# Patient Record
Sex: Male | Born: 1945
Health system: Southern US, Community
[De-identification: ages and names within clinical notes are randomized; demographics above are authoritative.]

## PROBLEM LIST (undated history)

## (undated) DIAGNOSIS — I10 Essential (primary) hypertension: Secondary | ICD-10-CM

## (undated) DIAGNOSIS — E785 Hyperlipidemia, unspecified: Secondary | ICD-10-CM

## (undated) DIAGNOSIS — G1221 Amyotrophic lateral sclerosis: Secondary | ICD-10-CM

## (undated) DIAGNOSIS — J309 Allergic rhinitis, unspecified: Secondary | ICD-10-CM

## (undated) DIAGNOSIS — M545 Low back pain, unspecified: Secondary | ICD-10-CM

## (undated) DIAGNOSIS — R42 Dizziness and giddiness: Secondary | ICD-10-CM

## (undated) DIAGNOSIS — R7302 Impaired glucose tolerance (oral): Secondary | ICD-10-CM

## (undated) HISTORY — DX: Dizziness and giddiness: R42

## (undated) HISTORY — DX: Impaired glucose tolerance (oral): R73.02

## (undated) HISTORY — DX: Hyperlipidemia, unspecified: E78.5

## (undated) HISTORY — DX: Allergic rhinitis, unspecified: J30.9

## (undated) HISTORY — DX: Low back pain, unspecified: M54.50

## (undated) HISTORY — DX: Essential (primary) hypertension: I10

## (undated) HISTORY — PX: TRACHEOSTOMY: SUR1362

## (undated) HISTORY — DX: Low back pain: M54.5

## (undated) HISTORY — DX: Amyotrophic lateral sclerosis: G12.21

## (undated) HISTORY — PX: OTHER SURGICAL HISTORY: SHX169

---

## 1997-09-09 ENCOUNTER — Other Ambulatory Visit: Admission: RE | Admit: 1997-09-09 | Discharge: 1997-09-09 | Payer: Self-pay | Admitting: *Deleted

## 1999-03-31 ENCOUNTER — Encounter: Payer: Self-pay | Admitting: Pulmonary Disease

## 1999-03-31 ENCOUNTER — Inpatient Hospital Stay (HOSPITAL_COMMUNITY): Admission: EM | Admit: 1999-03-31 | Discharge: 1999-05-21 | Payer: Self-pay | Admitting: Emergency Medicine

## 1999-03-31 ENCOUNTER — Encounter (INDEPENDENT_AMBULATORY_CARE_PROVIDER_SITE_OTHER): Payer: Self-pay | Admitting: *Deleted

## 1999-04-01 ENCOUNTER — Encounter: Payer: Self-pay | Admitting: Pulmonary Disease

## 1999-04-02 ENCOUNTER — Encounter: Payer: Self-pay | Admitting: Pulmonary Disease

## 1999-04-03 ENCOUNTER — Encounter: Payer: Self-pay | Admitting: Pulmonary Disease

## 1999-04-04 ENCOUNTER — Encounter: Payer: Self-pay | Admitting: Pulmonary Disease

## 1999-04-05 ENCOUNTER — Encounter: Payer: Self-pay | Admitting: Pulmonary Disease

## 1999-04-06 ENCOUNTER — Encounter: Payer: Self-pay | Admitting: Critical Care Medicine

## 1999-04-07 ENCOUNTER — Encounter: Payer: Self-pay | Admitting: Pulmonary Disease

## 1999-04-08 ENCOUNTER — Encounter: Payer: Self-pay | Admitting: Critical Care Medicine

## 1999-04-09 ENCOUNTER — Encounter: Payer: Self-pay | Admitting: Critical Care Medicine

## 1999-04-10 ENCOUNTER — Encounter: Payer: Self-pay | Admitting: General Surgery

## 1999-04-11 ENCOUNTER — Encounter: Payer: Self-pay | Admitting: Critical Care Medicine

## 1999-04-12 ENCOUNTER — Encounter: Payer: Self-pay | Admitting: Pulmonary Disease

## 1999-04-13 ENCOUNTER — Encounter: Payer: Self-pay | Admitting: Pulmonary Disease

## 1999-04-14 ENCOUNTER — Encounter: Payer: Self-pay | Admitting: Critical Care Medicine

## 1999-04-17 ENCOUNTER — Encounter: Payer: Self-pay | Admitting: Family Medicine

## 1999-04-22 ENCOUNTER — Encounter: Payer: Self-pay | Admitting: Pulmonary Disease

## 1999-04-23 ENCOUNTER — Encounter: Payer: Self-pay | Admitting: Pulmonary Disease

## 1999-05-02 ENCOUNTER — Encounter: Payer: Self-pay | Admitting: Pulmonary Disease

## 1999-05-04 ENCOUNTER — Encounter: Payer: Self-pay | Admitting: Pulmonary Disease

## 1999-05-06 ENCOUNTER — Encounter: Payer: Self-pay | Admitting: Pulmonary Disease

## 1999-05-07 ENCOUNTER — Encounter: Payer: Self-pay | Admitting: Critical Care Medicine

## 1999-05-09 ENCOUNTER — Encounter: Payer: Self-pay | Admitting: Pulmonary Disease

## 1999-05-10 ENCOUNTER — Encounter: Payer: Self-pay | Admitting: Pulmonary Disease

## 1999-05-10 ENCOUNTER — Encounter: Payer: Self-pay | Admitting: Neurology

## 1999-05-11 ENCOUNTER — Encounter: Payer: Self-pay | Admitting: Pulmonary Disease

## 1999-05-16 ENCOUNTER — Encounter: Payer: Self-pay | Admitting: Gastroenterology

## 1999-05-28 ENCOUNTER — Ambulatory Visit (HOSPITAL_COMMUNITY): Admission: RE | Admit: 1999-05-28 | Discharge: 1999-05-28 | Payer: Self-pay | Admitting: Gastroenterology

## 1999-05-28 ENCOUNTER — Encounter: Payer: Self-pay | Admitting: Gastroenterology

## 1999-05-29 ENCOUNTER — Emergency Department (HOSPITAL_COMMUNITY): Admission: EM | Admit: 1999-05-29 | Discharge: 1999-05-29 | Payer: Self-pay | Admitting: Emergency Medicine

## 1999-05-29 ENCOUNTER — Encounter: Payer: Self-pay | Admitting: Emergency Medicine

## 1999-06-20 ENCOUNTER — Ambulatory Visit (HOSPITAL_COMMUNITY): Admission: RE | Admit: 1999-06-20 | Discharge: 1999-06-20 | Payer: Self-pay | Admitting: Pulmonary Disease

## 1999-06-20 ENCOUNTER — Encounter: Payer: Self-pay | Admitting: Pulmonary Disease

## 1999-07-24 ENCOUNTER — Encounter: Payer: Self-pay | Admitting: Neurology

## 1999-07-24 ENCOUNTER — Ambulatory Visit (HOSPITAL_COMMUNITY): Admission: RE | Admit: 1999-07-24 | Discharge: 1999-07-24 | Payer: Self-pay | Admitting: Neurology

## 1999-08-14 ENCOUNTER — Encounter: Payer: Self-pay | Admitting: Gastroenterology

## 1999-11-12 ENCOUNTER — Emergency Department (HOSPITAL_COMMUNITY): Admission: EM | Admit: 1999-11-12 | Discharge: 1999-11-12 | Payer: Self-pay | Admitting: Emergency Medicine

## 1999-12-25 ENCOUNTER — Ambulatory Visit (HOSPITAL_COMMUNITY): Admission: RE | Admit: 1999-12-25 | Discharge: 1999-12-25 | Payer: Self-pay | Admitting: Neurosurgery

## 1999-12-25 ENCOUNTER — Encounter: Payer: Self-pay | Admitting: Neurosurgery

## 2000-04-19 ENCOUNTER — Emergency Department (HOSPITAL_COMMUNITY): Admission: EM | Admit: 2000-04-19 | Discharge: 2000-04-19 | Payer: Self-pay | Admitting: Emergency Medicine

## 2001-04-22 HISTORY — PX: OTHER SURGICAL HISTORY: SHX169

## 2001-05-27 ENCOUNTER — Ambulatory Visit (HOSPITAL_COMMUNITY): Admission: RE | Admit: 2001-05-27 | Discharge: 2001-05-27 | Payer: Self-pay | Admitting: General Surgery

## 2001-05-27 ENCOUNTER — Encounter: Payer: Self-pay | Admitting: General Surgery

## 2001-07-09 ENCOUNTER — Encounter: Payer: Self-pay | Admitting: Thoracic Surgery

## 2001-07-13 ENCOUNTER — Encounter: Payer: Self-pay | Admitting: Thoracic Surgery

## 2001-07-13 ENCOUNTER — Encounter (INDEPENDENT_AMBULATORY_CARE_PROVIDER_SITE_OTHER): Payer: Self-pay | Admitting: *Deleted

## 2001-07-13 ENCOUNTER — Inpatient Hospital Stay (HOSPITAL_COMMUNITY): Admission: RE | Admit: 2001-07-13 | Discharge: 2001-07-15 | Payer: Self-pay | Admitting: Thoracic Surgery

## 2001-07-14 ENCOUNTER — Encounter: Payer: Self-pay | Admitting: Thoracic Surgery

## 2001-09-30 ENCOUNTER — Encounter: Admission: RE | Admit: 2001-09-30 | Discharge: 2001-09-30 | Payer: Self-pay | Admitting: Thoracic Surgery

## 2001-09-30 ENCOUNTER — Encounter: Payer: Self-pay | Admitting: Thoracic Surgery

## 2003-10-01 ENCOUNTER — Encounter: Admission: RE | Admit: 2003-10-01 | Discharge: 2003-10-01 | Payer: Self-pay | Admitting: Internal Medicine

## 2004-03-07 ENCOUNTER — Ambulatory Visit: Payer: Self-pay | Admitting: Internal Medicine

## 2004-08-23 ENCOUNTER — Ambulatory Visit: Payer: Self-pay | Admitting: Internal Medicine

## 2004-09-10 ENCOUNTER — Encounter: Admission: RE | Admit: 2004-09-10 | Discharge: 2004-09-10 | Payer: Self-pay | Admitting: Family Medicine

## 2004-09-13 ENCOUNTER — Ambulatory Visit: Payer: Self-pay | Admitting: Internal Medicine

## 2004-09-26 ENCOUNTER — Encounter: Admission: RE | Admit: 2004-09-26 | Discharge: 2004-09-26 | Payer: Self-pay | Admitting: Family Medicine

## 2004-10-10 ENCOUNTER — Encounter: Admission: RE | Admit: 2004-10-10 | Discharge: 2004-10-10 | Payer: Self-pay | Admitting: Family Medicine

## 2005-02-05 ENCOUNTER — Ambulatory Visit: Payer: Self-pay | Admitting: Internal Medicine

## 2005-02-26 ENCOUNTER — Ambulatory Visit: Payer: Self-pay | Admitting: Internal Medicine

## 2005-04-11 ENCOUNTER — Ambulatory Visit: Payer: Self-pay | Admitting: Internal Medicine

## 2005-05-13 ENCOUNTER — Ambulatory Visit: Payer: Self-pay | Admitting: Internal Medicine

## 2005-06-24 ENCOUNTER — Ambulatory Visit: Payer: Self-pay | Admitting: Internal Medicine

## 2006-01-30 ENCOUNTER — Ambulatory Visit: Payer: Self-pay | Admitting: Internal Medicine

## 2006-03-24 ENCOUNTER — Ambulatory Visit: Payer: Self-pay | Admitting: Internal Medicine

## 2006-09-19 ENCOUNTER — Ambulatory Visit: Payer: Self-pay | Admitting: Internal Medicine

## 2006-09-22 ENCOUNTER — Ambulatory Visit: Payer: Self-pay | Admitting: Internal Medicine

## 2006-09-22 ENCOUNTER — Encounter: Payer: Self-pay | Admitting: Internal Medicine

## 2006-09-22 LAB — CONVERTED CEMR LAB
ALT: 44 units/L — ABNORMAL HIGH (ref 0–40)
Albumin: 3.5 g/dL (ref 3.5–5.2)
Alkaline Phosphatase: 74 units/L (ref 39–117)
Basophils Absolute: 0 10*3/uL (ref 0.0–0.1)
Basophils Relative: 0.6 % (ref 0.0–1.0)
Cholesterol: 221 mg/dL (ref 0–200)
Direct LDL: 126.4 mg/dL
Eosinophils Absolute: 0.4 10*3/uL (ref 0.0–0.6)
Eosinophils Relative: 5.9 % — ABNORMAL HIGH (ref 0.0–5.0)
Folate: 7.1 ng/mL
MCHC: 34 g/dL (ref 30.0–36.0)
Monocytes Absolute: 0.6 10*3/uL (ref 0.2–0.7)
Neutro Abs: 3.3 10*3/uL (ref 1.4–7.7)
PSA: 0.34 ng/mL
RBC: 4.68 M/uL (ref 4.22–5.81)
Total CHOL/HDL Ratio: 6.2
Vitamin B-12: 512 pg/mL (ref 211–911)

## 2006-11-22 ENCOUNTER — Encounter: Payer: Self-pay | Admitting: Internal Medicine

## 2006-11-22 DIAGNOSIS — R05 Cough: Secondary | ICD-10-CM

## 2006-11-22 DIAGNOSIS — M545 Low back pain, unspecified: Secondary | ICD-10-CM | POA: Insufficient documentation

## 2006-11-22 DIAGNOSIS — R1319 Other dysphagia: Secondary | ICD-10-CM

## 2006-11-22 DIAGNOSIS — I10 Essential (primary) hypertension: Secondary | ICD-10-CM

## 2006-11-22 DIAGNOSIS — G7089 Other specified myoneural disorders: Secondary | ICD-10-CM | POA: Insufficient documentation

## 2006-11-22 DIAGNOSIS — J309 Allergic rhinitis, unspecified: Secondary | ICD-10-CM | POA: Insufficient documentation

## 2006-11-22 DIAGNOSIS — J984 Other disorders of lung: Secondary | ICD-10-CM

## 2006-11-22 DIAGNOSIS — B957 Other staphylococcus as the cause of diseases classified elsewhere: Secondary | ICD-10-CM | POA: Insufficient documentation

## 2007-01-21 ENCOUNTER — Ambulatory Visit: Payer: Self-pay | Admitting: Internal Medicine

## 2007-03-30 ENCOUNTER — Ambulatory Visit: Payer: Self-pay | Admitting: Internal Medicine

## 2007-03-30 DIAGNOSIS — R0602 Shortness of breath: Secondary | ICD-10-CM

## 2007-03-30 DIAGNOSIS — E739 Lactose intolerance, unspecified: Secondary | ICD-10-CM

## 2007-03-30 DIAGNOSIS — E785 Hyperlipidemia, unspecified: Secondary | ICD-10-CM | POA: Insufficient documentation

## 2007-03-30 LAB — CONVERTED CEMR LAB
AST: 45 units/L — ABNORMAL HIGH (ref 0–37)
BUN: 11 mg/dL (ref 6–23)
Bilirubin, Direct: 0.1 mg/dL (ref 0.0–0.3)
CO2: 28 meq/L (ref 19–32)
Calcium: 9.5 mg/dL (ref 8.4–10.5)
Chloride: 101 meq/L (ref 96–112)
Cholesterol: 120 mg/dL (ref 0–200)
Creatinine, Ser: 0.5 mg/dL (ref 0.4–1.5)
GFR calc non Af Amer: 179 mL/min
Glucose, Bld: 100 mg/dL — ABNORMAL HIGH (ref 70–99)
HDL: 44.4 mg/dL (ref >39.0)
Hgb A1c MFr Bld: 5.9 % (ref 4.6–6.0)
LDL Cholesterol: 51 mg/dL (ref 0–99)
Lymphocytes Relative: 31.7 % (ref 12.0–46.0)
Neutro Abs: 4.4 10*3/uL (ref 1.4–7.7)
Neutrophils Relative %: 55.8 % (ref 43.0–77.0)
RBC: 4.81 M/uL (ref 4.22–5.81)
Sodium: 137 meq/L (ref 135–145)
Total Bilirubin: 0.6 mg/dL (ref 0.3–1.2)
Total CHOL/HDL Ratio: 2.7
Total Protein: 8.1 g/dL (ref 6.0–8.3)
Triglycerides: 125 mg/dL (ref 0–149)
VLDL: 25 mg/dL (ref 0–40)
WBC: 7.9 10*3/uL (ref 4.5–10.5)

## 2007-09-15 ENCOUNTER — Encounter: Payer: Self-pay | Admitting: Internal Medicine

## 2007-09-29 ENCOUNTER — Ambulatory Visit: Payer: Self-pay | Admitting: Internal Medicine

## 2007-09-29 DIAGNOSIS — B370 Candidal stomatitis: Secondary | ICD-10-CM

## 2007-12-29 ENCOUNTER — Ambulatory Visit: Payer: Self-pay | Admitting: Internal Medicine

## 2007-12-29 DIAGNOSIS — R1084 Generalized abdominal pain: Secondary | ICD-10-CM

## 2007-12-29 DIAGNOSIS — H1045 Other chronic allergic conjunctivitis: Secondary | ICD-10-CM

## 2008-02-05 ENCOUNTER — Ambulatory Visit: Payer: Self-pay | Admitting: Internal Medicine

## 2008-03-30 ENCOUNTER — Ambulatory Visit: Payer: Self-pay | Admitting: Internal Medicine

## 2008-03-30 DIAGNOSIS — R5383 Other fatigue: Secondary | ICD-10-CM

## 2008-03-30 DIAGNOSIS — R5381 Other malaise: Secondary | ICD-10-CM

## 2008-03-31 LAB — CONVERTED CEMR LAB
ALT: 47 units/L (ref 0–53)
Alkaline Phosphatase: 57 units/L (ref 39–117)
BUN: 10 mg/dL (ref 6–23)
Bilirubin Urine: NEGATIVE
Bilirubin, Direct: 0.1 mg/dL (ref 0.0–0.3)
Calcium: 9.7 mg/dL (ref 8.4–10.5)
Cholesterol: 138 mg/dL (ref 0–200)
Creatinine, Ser: 0.4 mg/dL (ref 0.4–1.5)
Eosinophils Absolute: 0.1 10*3/uL (ref 0.0–0.7)
Folate: 6.9 ng/mL
Hemoglobin: 14 g/dL (ref 13.0–17.0)
LDL Cholesterol: 67 mg/dL (ref 0–99)
MCV: 89.4 fL (ref 78.0–100.0)
Monocytes Absolute: 0.4 10*3/uL (ref 0.1–1.0)
Monocytes Relative: 5.5 % (ref 3.0–12.0)
Neutro Abs: 5.4 10*3/uL (ref 1.4–7.7)
Neutrophils Relative %: 70.6 % (ref 43.0–77.0)
PSA: 0.28 ng/mL (ref 0.10–4.00)
Platelets: 262 10*3/uL (ref 150–400)
Potassium: 4.2 meq/L (ref 3.5–5.1)
RBC: 4.69 M/uL (ref 4.22–5.81)
TSH: 0.78 microintl units/mL (ref 0.35–5.50)
Total Protein, Urine: 100 mg/dL — AB
Total Protein: 8.2 g/dL (ref 6.0–8.3)
Triglycerides: 78 mg/dL (ref 0–149)
Urobilinogen, UA: 0.2 (ref 0.0–1.0)
VLDL: 16 mg/dL (ref 0–40)

## 2008-04-03 ENCOUNTER — Encounter: Payer: Self-pay | Admitting: Internal Medicine

## 2008-04-28 ENCOUNTER — Ambulatory Visit: Payer: Self-pay | Admitting: Internal Medicine

## 2008-04-28 ENCOUNTER — Ambulatory Visit: Payer: Self-pay | Admitting: Gastroenterology

## 2008-04-28 ENCOUNTER — Encounter: Payer: Self-pay | Admitting: Internal Medicine

## 2008-05-04 ENCOUNTER — Encounter: Payer: Self-pay | Admitting: Internal Medicine

## 2008-05-19 ENCOUNTER — Ambulatory Visit: Payer: Self-pay | Admitting: Internal Medicine

## 2008-11-23 ENCOUNTER — Encounter: Payer: Self-pay | Admitting: Internal Medicine

## 2008-12-27 ENCOUNTER — Encounter: Admission: RE | Admit: 2008-12-27 | Discharge: 2008-12-27 | Payer: Self-pay | Admitting: Family Medicine

## 2009-01-26 ENCOUNTER — Ambulatory Visit: Payer: Self-pay | Admitting: Internal Medicine

## 2009-01-26 DIAGNOSIS — G1221 Amyotrophic lateral sclerosis: Secondary | ICD-10-CM

## 2009-01-26 LAB — CONVERTED CEMR LAB
AST: 40 units/L — ABNORMAL HIGH (ref 0–37)
Albumin: 3.8 g/dL (ref 3.5–5.2)
Alkaline Phosphatase: 84 units/L (ref 39–117)
Basophils Absolute: 0.1 10*3/uL (ref 0.0–0.1)
Basophils Relative: 0.8 % (ref 0.0–3.0)
CO2: 28 meq/L (ref 19–32)
Eosinophils Absolute: 0.2 10*3/uL (ref 0.0–0.7)
Eosinophils Relative: 3.1 % (ref 0.0–5.0)
GFR calc non Af Amer: 230 mL/min (ref >60)
HCT: 40.3 % (ref 39.0–52.0)
HDL: 49.9 mg/dL (ref >39.00)
Ketones, ur: NEGATIVE mg/dL
Leukocytes, UA: NEGATIVE
Monocytes Relative: 9 % (ref 3.0–12.0)
Neutro Abs: 4.2 10*3/uL (ref 1.4–7.7)
Nitrite: NEGATIVE
Sodium: 146 meq/L — ABNORMAL HIGH (ref 135–145)
Total CHOL/HDL Ratio: 3
Total Protein, Urine: 30 mg/dL
Total Protein: 8.1 g/dL (ref 6.0–8.3)
Triglycerides: 97 mg/dL (ref 0.0–149.0)
pH: 5.5 (ref 5.0–8.0)

## 2009-02-15 ENCOUNTER — Encounter: Payer: Self-pay | Admitting: Internal Medicine

## 2009-03-03 ENCOUNTER — Ambulatory Visit: Payer: Self-pay | Admitting: Internal Medicine

## 2009-03-31 ENCOUNTER — Encounter: Admission: RE | Admit: 2009-03-31 | Discharge: 2009-03-31 | Payer: Self-pay | Admitting: Family Medicine

## 2009-05-15 ENCOUNTER — Ambulatory Visit: Payer: Self-pay | Admitting: Internal Medicine

## 2009-07-11 ENCOUNTER — Ambulatory Visit: Payer: Self-pay | Admitting: Internal Medicine

## 2009-07-25 ENCOUNTER — Ambulatory Visit: Payer: Self-pay | Admitting: Internal Medicine

## 2009-09-01 ENCOUNTER — Ambulatory Visit: Payer: Self-pay | Admitting: Internal Medicine

## 2009-09-27 ENCOUNTER — Ambulatory Visit: Payer: Self-pay | Admitting: Internal Medicine

## 2009-10-30 ENCOUNTER — Ambulatory Visit: Payer: Self-pay | Admitting: Internal Medicine

## 2010-03-05 ENCOUNTER — Ambulatory Visit: Payer: Self-pay | Admitting: Internal Medicine

## 2010-03-05 DIAGNOSIS — R42 Dizziness and giddiness: Secondary | ICD-10-CM

## 2010-04-24 ENCOUNTER — Encounter: Payer: Self-pay | Admitting: Internal Medicine

## 2010-05-13 ENCOUNTER — Encounter: Payer: Self-pay | Admitting: Internal Medicine

## 2010-05-24 NOTE — Miscellaneous (Signed)
Summary: [facility]  [facility]   Imported By: Lester Twisp 05/20/2009 10:26:16  _____________________________________________________________________  External Attachment:    Type:   Image     Comment:   External Document

## 2010-05-24 NOTE — Assessment & Plan Note (Signed)
Summary: SDR--COUGH--FEVER---STC   Vital Signs:  Patient profile:   65 year old male Height:      65 inches Weight:      142.25 pounds BMI:     23.76 O2 Sat:      96 % on Room air Temp:     97.1 degrees F oral Pulse rate:   68 / minute BP sitting:   110 / 78  (left arm) Cuff size:   regular  Vitals Entered ByZella Ball Ewing (July 11, 2009 3:24 PM)  O2 Flow:  Room air  CC: Cough at night/RE   Primary Care Provider:  Oliver Barre, MD  CC:  Cough at night/RE.  History of Present Illness: here with 3 days onset sinus congestion mild mostly clearish congsetion, and ongoing ALS with ability to ambulate worsening recently (here with family friend for the first time, has always come by himself in the past);  today with worsening ST and prod cough greenish sputum, with feverish feeling but Pt denies CP, sob, doe, wheezing, orthopnea, pnd, worsening LE edema, palps, dizziness or syncope .  Has general increased weakness but no falls or MSK complaints.    Problems Prior to Update: 1)  Bronchitis-acute  (ICD-466.0) 2)  Als  (ICD-335.20) 3)  Cough  (ICD-786.2) 4)  Screening Colorectal-cancer  (ICD-V76.51) 5)  Special Screening Malig Neoplasms Other Sites  (ICD-V76.49) 6)  Fatigue  (ICD-780.79) 7)  Abdominal Pain, Generalized  (ICD-789.07) 8)  Allergic Conjunctivitis  (ICD-372.14) 9)  Thrush  (ICD-112.0) 10)  Dyspnea  (ICD-786.05) 11)  Hepatotoxicity, Drug-induced, Risk of  (ICD-V58.69) 12)  Glucose Intolerance  (ICD-271.3) 13)  Hyperlipidemia  (ICD-272.4) 14)  Low Back Pain  (ICD-724.2) 15)  Cough, Chronic  (ICD-786.2) 16)  Disorder, Myoneural Nec  (ICD-358.8) 17)  Allergic Rhinitis  (ICD-477.9) 18)  Hypertension  (ICD-401.9) 19)  Other Dysphagia  (ICD-787.29) 20)  Methicillin Resistant Staphylococcus Aureus Infection  (ICD-041.19) 21)  Insufficiency, Pulmonary Nec  (ICD-518.82)  Medications Prior to Update: 1)  Crestor 20 Mg Tabs (Rosuvastatin Calcium) .... Take 1 Tablet By  Mouth Once A Day 2)  Methocarbamol 750 Mg Tabs (Methocarbamol) .... Take 1 Tablet By Mouth Two Times A Day As Needed 3)  Vitamin D 400 Unit  Tabs (Cholecalciferol) .... Take 1 Tablet By Mouth Once A Day 4)  Ecotrin Low Strength 81 Mg  Tbec (Aspirin) .... Take 1 Tablet By Mouth As Needed 5)  Amlodipine Besy-Benazepril Hcl 10-20 Mg  Caps (Amlodipine Besy-Benazepril Hcl) .[name].. 1 By Mouth Once Daily 6)  Cetirizine Hcl 10 Mg Tabs (Cetirizine Hcl) .[name].. 1 By Mouth Once Daily As Needed Allergies 7)  Omeprazole 20 Mg Cpdr (Omeprazole) .[name].. 1po Once Daily 8)  Rilutek 50 Mg Tabs (Riluzole) .[name].. 1 By Mouth Two Times A Day  Current Medications (verified): 1)  Crestor 20 Mg Tabs (Rosuvastatin Calcium) .... Take 1 Tablet By Mouth Once A Day 2)  Methocarbamol 750 Mg Tabs (Methocarbamol) .... Take 1 Tablet By Mouth Two Times A Day As Needed 3)  Vitamin D 400 Unit  Tabs (Cholecalciferol) .... Take 1 Tablet By Mouth Once A Day 4)  Ecotrin Low Strength 81 Mg  Tbec (Aspirin) .... Take 1 Tablet By Mouth As Needed 5)  Amlodipine Besy-Benazepril Hcl 10-20 Mg  Caps (Amlodipine Besy-Benazepril Hcl) .[name].. 1 By Mouth Once Daily 6)  Cetirizine Hcl 10 Mg Tabs (Cetirizine Hcl) .[name].. 1 By Mouth Once Daily As Needed Allergies 7)  Omeprazole 20 Mg Cpdr (Omeprazole) .[name].. 1po Once Daily  8)  Rilutek 50 Mg Tabs (Riluzole) .[name].. 1 By Mouth Two Times A Day 9)  Cephalexin 500 Mg Caps (Cephalexin) .[name].. 1 By Mouth Three Times A Day 10)  Hydrocodone-Homatropine 5-1.5 Mg/36ml Syrp (Hydrocodone-Homatropine) .[name].. 1 Tsp By Mouth Q 6 Hrs As Needed Cough  Allergies (verified): No Known Drug Allergies  Past History:  Past Medical History: Last updated: 01/26/2009 H/O Acute Respiratory Distress requiring ventiator support and Tracheostomy/ARDS - 2001 H/O MRSA colonization Neurogenic Dysphagia Hypertension Allergic rhinitis Chronic Neuromuscular Disease Chronic Cough Low back pain lumbar DDD Hyperlipidemia glucose  intolerance ALS  Past Surgical History: Last updated: 04/28/2008 Lipoma Removal - 2003 Tracheostomy Percutaneous Gastrostomy Tube Placement - 04/1999 and removed 07/1999  Social History: Last updated: 04/28/2008 Alcohol use-no Single 4 children - all died in [place] in the 70's turmoil disabled 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   Head:  normocephalic and atraumatic.   Eyes:  vision grossly intact, pupils equal, and pupils round.   Ears:  bialt tm's red, sinus nontender Nose:  nasal dischargemucosal pallor and mucosal edema.   Mouth:  pharyngeal erythema and fair dentition.   Neck:  supple and no masses.   Lungs:  normal respiratory effort, R decreased breath sounds, and L decreased breath sounds.  but no frank wheezing or rales Heart:  normal rate and regular rhythm.   Extremities:  no edema, no erythema  Neurologic:  gait disorder evident, needs assist up to exam table today, alert and O , no confusion   Impression & Recommendations:  Problem # 1:  BRONCHITIS-ACUTE (ICD-466.0)  with recent URI - ? risk for aspiration with ALS; ok to tx with antibx, cough med; declines cxr today  His updated medication list for this problem includes:    Cephalexin 500 Mg Caps (Cephalexin) .[name]... 1 by mouth three times a day    Hydrocodone-homatropine 5-1.5 Mg/82ml Syrp (Hydrocodone-homatropine) .[name]... 1 tsp by mouth q 6 hrs as needed cough  Problem # 2:  HYPERTENSION (ICD-401.9)  His updated medication list for this problem includes:    Amlodipine Besy-benazepril Hcl 10-20 Mg Caps (Amlodipine besy-benazepril hcl) .[name]... 1 by mouth once daily  BP today: 110/78 Prior BP: 128/64 (01/26/2009)  Labs Reviewed: K+: 4.5  (01/26/2009) Creat: : 0.4 (01/26/2009)   Chol: 145 (01/26/2009)   HDL: 49.90 (01/26/2009)   LDL: 76 (01/26/2009)   TG: 97.0 (01/26/2009) stable overall by hx and exam, ok to continue meds/tx as is   Complete Medication List: 1)  Crestor 20 Mg Tabs (Rosuvastatin calcium) .... Take 1 tablet by mouth once a day 2)  Methocarbamol 750 Mg Tabs (Methocarbamol) .... Take 1 tablet by mouth two times a day as needed 3)  Vitamin D 400 Unit Tabs (Cholecalciferol) .... Take 1 tablet by mouth once a day 4)  Ecotrin Low Strength 81 Mg Tbec (Aspirin) .... Take 1 tablet by mouth as needed 5)  Amlodipine Besy-benazepril Hcl 10-20 Mg Caps (Amlodipine besy-benazepril hcl) .[name].. 1 by mouth once daily 6)  Cetirizine Hcl 10 Mg Tabs (Cetirizine hcl) .[name].. 1 by mouth once daily as needed allergies 7)  Omeprazole 20 Mg Cpdr (Omeprazole) .[name].. 1po once daily 8)  Rilutek 50 Mg Tabs (Riluzole) .[name].. 1 by mouth  two times a day 9)  Cephalexin 500 Mg Caps (Cephalexin) .[name].. 1 by mouth three times a day 10)  Hydrocodone-homatropine 5-1.5 Mg/26ml Syrp (Hydrocodone-homatropine) .[name].. 1 tsp by mouth q 6 hrs as needed cough  Patient Instructions: 1)  Please take all new medications as prescribed (your antibiotic was sent to your pharmacy, and the cough medicine prescription is given to you today) 2)  Continue all previous medications as before this visit  3)  Call if the fever or cough worsen, or you develop wheezing , pain or shortness of breath 4)  Please schedule a follow-up appointment in October 2011 with CPX labs Prescriptions: HYDROCODONE-HOMATROPINE 5-1.5 MG/5ML SYRP (HYDROCODONE-HOMATROPINE) 1 tsp by mouth q 6 hrs as needed cough  #6oz x 1   Entered and Authorized by:   Corwin Levins MD   Signed by:   Corwin Levins MD on 07/11/2009   Method used:   Print then Give to Patient   RxID:   [id number] CEPHALEXIN 500 MG CAPS (CEPHALEXIN) 1 by mouth three times a day  #30 x 0   Entered and Authorized by:   Corwin Levins MD    Signed by:   Corwin Levins MD on 07/11/2009   Method used:   Electronically to        [facility]. [phone]* (retail)       [address]       [place], [place]  [address]       Ph: [phone] or [phone]       Fax: [phone]   RxID:   [id number]

## 2010-05-24 NOTE — Assessment & Plan Note (Signed)
Summary: cough,cant sleep/cd   Vital Signs:  Patient profile:   65 year old male Height:      65 inches Weight:      136.50 pounds BMI:     22.80 O2 Sat:      99 % on Room air Temp:     96.5 degrees F oral Pulse rate:   76 / minute BP sitting:   118 / 60  (left arm) Cuff size:   regular  Vitals Entered ByZella Ball Ewing (July 25, 2009 9:17 AM)  O2 Flow:  Room air  CC: coughing, not sleeping well/RE   Primary Care Provider:  Oliver Barre, MD  CC:  coughing and not sleeping well/RE.  History of Present Illness: here with marked eye and nasal/sinus allergy type symtpoms with itch, clearish d/c and soft tissue sweling adn conjunt erythema without pain, fever or colored d/c - all onoging for several wks; infectiuos symptoms from last visit resolved; but still hard to sleep at night as the cough seems worse at night, espedcially last night kept him up all night.   Pt denies CP, sob, doe, wheezing, orthopnea, pnd, worsening LE edema, palps, dizziness or syncope   Pt denies new neuro symptoms such as headache, facial or extremity weakness   Here for wellness Diet: Heart Healthy or DM if diabetic Physical Activities: Sedentary Depression/mood screen: Negative Hearing: Intact bilateral Visual Acuity: Grossly normal ADL's: mild impairment, requuires some assit with dressing Fall Risk: moderate currently, unable to ascent exam today today even with assist Home Safety: Good End-of-Life Planning: Advance directive - Full code/I agree , but does not want prolonged mechanical ventilation  Problems Prior to Update: 1)  Allergic Rhinitis  (ICD-477.9) 2)  Bronchitis-acute  (ICD-466.0) 3)  Als  (ICD-335.20) 4)  Cough  (ICD-786.2) 5)  Screening Colorectal-cancer  (ICD-V76.51) 6)  Special Screening Malig Neoplasms Other Sites  (ICD-V76.49) 7)  Fatigue  (ICD-780.79) 8)  Abdominal Pain, Generalized  (ICD-789.07) 9)  Allergic Conjunctivitis  (ICD-372.14) 10)  Thrush  (ICD-112.0) 11)  Dyspnea   (ICD-786.05) 12)  Hepatotoxicity, Drug-induced, Risk of  (ICD-V58.69) 13)  Glucose Intolerance  (ICD-271.3) 14)  Hyperlipidemia  (ICD-272.4) 15)  Low Back Pain  (ICD-724.2) 16)  Cough, Chronic  (ICD-786.2) 17)  Disorder, Myoneural Nec  (ICD-358.8) 18)  Allergic Rhinitis  (ICD-477.9) 19)  Hypertension  (ICD-401.9) 20)  Other Dysphagia  (ICD-787.29) 21)  Methicillin Resistant Staphylococcus Aureus Infection  (ICD-041.19) 22)  Insufficiency, Pulmonary Nec  (ICD-518.82)  Medications Prior to Update: 1)  Crestor 20 Mg Tabs (Rosuvastatin Calcium) .... Take 1 Tablet By Mouth Once A Day 2)  Methocarbamol 750 Mg Tabs (Methocarbamol) .... Take 1 Tablet By Mouth Two Times A Day As Needed 3)  Vitamin D 400 Unit  Tabs (Cholecalciferol) .... Take 1 Tablet By Mouth Once A Day 4)  Ecotrin Low Strength 81 Mg  Tbec (Aspirin) .... Take 1 Tablet By Mouth As Needed 5)  Amlodipine Besy-Benazepril Hcl 10-20 Mg  Caps (Amlodipine Besy-Benazepril Hcl) .[name].. 1 By Mouth Once Daily 6)  Cetirizine Hcl 10 Mg Tabs (Cetirizine Hcl) .[name].. 1 By Mouth Once Daily As Needed Allergies 7)  Omeprazole 20 Mg Cpdr (Omeprazole) .[name].. 1po Once Daily 8)  Rilutek 50 Mg Tabs (Riluzole) .[name].. 1 By Mouth Two Times A Day 9)  Cephalexin 500 Mg Caps (Cephalexin) .[name].. 1 By Mouth Three Times A Day 10)  Hydrocodone-Homatropine 5-1.5 Mg/28ml Syrp (Hydrocodone-Homatropine) .[name].. 1 Tsp By Mouth Q 6 Hrs As Needed Cough  Current  Medications (verified): 1)  Crestor 20 Mg Tabs (Rosuvastatin Calcium) .... Take 1 Tablet By Mouth Once A Day 2)  Methocarbamol 750 Mg Tabs (Methocarbamol) .... Take 1 Tablet By Mouth Two Times A Day As Needed 3)  Vitamin D 400 Unit  Tabs (Cholecalciferol) .... Take 1 Tablet By Mouth Once A Day 4)  Ecotrin Low Strength 81 Mg  Tbec (Aspirin) .... Take 1 Tablet By Mouth As Needed 5)  Amlodipine Besy-Benazepril Hcl 10-20 Mg  Caps (Amlodipine Besy-Benazepril Hcl) .[name].. 1 By Mouth Once Daily 6)  Cetirizine Hcl 10 Mg Tabs (Cetirizine  Hcl) .[name].. 1 By Mouth Once Daily As Needed Allergies 7)  Omeprazole 20 Mg Cpdr (Omeprazole) .[name].. 1po Once Daily 8)  Rilutek 50 Mg Tabs (Riluzole) .[name].. 1 By Mouth Two Times A Day 9)  Tessalon Perles 100 Mg Caps (Benzonatate) .[name].. 1 - 2 By Mouth Three Times A Day As Needed 10)  Prednisone 10 Mg Tabs (Prednisone) .... 3po Qd For 3days, Then 2po Qd For 3days, Then 1po Qd For 3days, Then Stop  Allergies (verified): No Known Drug Allergies  Past History:  Past Medical History: Last updated: 01/26/2009 H/O Acute Respiratory Distress requiring ventiator support and Tracheostomy/ARDS - 2001 H/O MRSA colonization Neurogenic Dysphagia Hypertension Allergic rhinitis Chronic Neuromuscular Disease Chronic Cough Low back pain lumbar DDD Hyperlipidemia glucose intolerance ALS  Past Surgical History: Last updated: 04/28/2008 Lipoma Removal - 2003 Tracheostomy Percutaneous Gastrostomy Tube Placement - 04/1999 and removed 07/1999  Family History: Last updated: 09/29/2007 No neuromuscular disease not sure o/w of family - most still in [place]  Social History: Last updated: 04/28/2008 Alcohol use-no Single 4 children - all died in [place] in the 70's turmoil 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         all otherwise negative per pt -    Physical Exam  General:  alert and well-developed.   Head:  normocephalic and  atraumatic.   Eyes:  vision grossly intact, pupils equal, and pupils round., bilat conjuncitva with clearish d/c and lid mild sweling adn conjucnt injection   Ears:  bilat tm's mild red, sinus nontender Nose:  nasal dischargemucosal pallor and mucosal edema.   Mouth:  pharyngeal erythema and fair dentition.   Neck:  supple and no masses.   Lungs:  normal respiratory effort and normal breath sounds.   Heart:  normal rate and regular rhythm.   Abdomen:  soft, non-tender, and normal bowel sounds.   Extremities:  no edema, no erythema  Neurologic:  gait disorder evident, needs assist up to exam table today, alert and O , no confusion   Impression & Recommendations:  Problem # 1:  Preventive Health Care (ICD-V70.0)  Overall doing well, age appropriate education and counseling updated and referral for appropriate preventive services done unless declined, immunizations up to date or declined, diet counseling done if overweight, urged to quit smoking if smokes , most recent labs reviewed and current ordered if appropriate, ecg reviewed or declined (interpretation per ECG scanned in the EMR if done); information regarding Medicare Prevention requirements given if appropriate  Orders: First annual wellness visit with prevention plan  [id number])  Problem # 2:  ALLERGIC RHINITIS (ICD-477.9)  His updated medication list for this problem includes:    Cetirizine Hcl 10 Mg Tabs (Cetirizine hcl) .[name]... 1 by mouth once daily as needed allergies stable overall by hx and exam, ok to continue meds/tx as is  - treat as above and below, f/u any worsening signs or symptoms   Orders: Depo- Medrol 40mg  (J1030) Depo- Medrol 80mg  (J1040) Admin of Therapeutic Inj  intramuscular or subcutaneous (98119) Prescription Created Electronically [phone] [phone])  Problem # 3:  COUGH (ICD-786.2)  most likely due to allergic rhinits and post nasal gtt, for treat as below, f/u any worsening signs or symptoms ,also check  cxr  Orders: T-2 View CXR, Same Day (71020.5TC)  Problem # 4:  ALLERGIC CONJUNCTIVITIS (ICD-372.14) marked - for zaditor OTC, and depo medrol today, and prednisone  burst and taper off  Problem # 5:  HYPERTENSION (ICD-401.9)  His updated medication list for this problem includes:    Amlodipine Besy-benazepril Hcl 10-20 Mg Caps (Amlodipine besy-benazepril hcl) .[name]... 1 by mouth once daily  BP today: 118/60 Prior BP: 110/78 (07/11/2009)  Labs Reviewed: K+: 4.5 (01/26/2009) Creat: : 0.4 (01/26/2009)   Chol: 145 (01/26/2009)   HDL: 49.90 (01/26/2009)   LDL: 76 (01/26/2009)   TG: 97.0 (01/26/2009) stable overall by hx and exam, ok to continue meds/tx as is   Complete Medication List: 1)  Crestor 20 Mg Tabs (Rosuvastatin calcium) .... Take 1 tablet by mouth once a day 2)  Methocarbamol 750 Mg Tabs (Methocarbamol) .... Take 1 tablet by mouth two times a day as needed 3)  Vitamin D 400 Unit Tabs (Cholecalciferol) .... Take 1 tablet by mouth once a day 4)  Ecotrin Low Strength 81 Mg Tbec (Aspirin) .... Take 1 tablet by mouth as needed 5)  Amlodipine Besy-benazepril Hcl 10-20 Mg Caps (Amlodipine besy-benazepril hcl) .[name].. 1 by mouth once daily 6)  Cetirizine Hcl 10 Mg Tabs (Cetirizine hcl) .[name].. 1 by mouth once daily as needed allergies 7)  Omeprazole 20 Mg Cpdr (Omeprazole) .[name].. 1po once daily 8)  Rilutek 50 Mg Tabs (Riluzole) .[name].. 1 by mouth two times a day 9)  Tessalon Perles 100 Mg Caps (Benzonatate) .[name].. 1 - 2 by mouth three times a day as needed 10)  Prednisone 10 Mg Tabs (Prednisone) .... 3po qd for 3days, then 2po qd for 3days, then 1po qd for 3days, then stop  Patient Instructions: 1)  you had the steroid shot today 2)  Please take all new medications as prescribed - the prednisone as directed, and pills for cough 3)  you can also use OTC Zaditor (zyrtec eye drops) for the eye symptoms  4)  Please go to Radiology in the basement level for your X-Ray today  5)  Please schedule a  follow-up appointment in 6 months. Prescriptions: PREDNISONE 10 MG TABS (PREDNISONE) 3po qd for 3days, then 2po qd for 3days, then 1po qd for 3days, then stop  #18 x 0   Entered and Authorized by:   Corwin Levins MD   Signed by:   Corwin Levins MD on 07/25/2009   Method used:   Print then Give to Patient   RxID:   [id number] TESSALON PERLES 100 MG CAPS (BENZONATATE) 1 - 2 by mouth three times a day as needed  #60 x 1   Entered and Authorized by:   Corwin Levins MD   Signed by:  Corwin Levins MD on 07/25/2009   Method used:   Print then Give to Patient   RxID:   [id number]    Medication Administration  Injection # 1:    Medication: Depo- Medrol 40mg     Diagnosis: ALLERGIC RHINITIS (ICD-477.9)    Route: IM    Site: RUOQ gluteus    Exp Date: 02/2012    Lot #: [id number]    Mfr: [employer]    Given by: Zella Ball Ewing (July 25, 2009 9:45 AM)  Injection # 2:    Medication: Depo- Medrol 80mg     Diagnosis: ALLERGIC RHINITIS (ICD-477.9)    Route: IM    Site: RUOQ gluteus    Exp Date: 02/2012    Lot #: [id number]    Mfr: [employer]    Given by: Zella Ball Ewing (July 25, 2009 9:45 AM)  Orders Added: 1)  Depo- Medrol 40mg  [J1030] 2)  Depo- Medrol 80mg  [J1040] 3)  Admin of Therapeutic Inj  intramuscular or subcutaneous [96372] 4)  T-2 View CXR, Same Day [71020.5TC] 5)  First annual wellness visit with prevention plan  [G0438] 6)  Prescription Created Electronically [G8553] 7)  Est. Patient Level IV [14782]

## 2010-05-24 NOTE — Miscellaneous (Signed)
[facility]   Imported By: Lester Durant 07/20/2009 12:05:51  _____________________________________________________________________  External Attachment:    Type:   Image     Comment:   External Document

## 2010-05-24 NOTE — Letter (Signed)
Summary: [employer] / [employer] / [employer] [place] [employer]   Imported By: Lennie Odor 04/26/2010 16:45:10  _____________________________________________________________________  External Attachment:    Type:   Image     Comment:   External Document

## 2010-05-24 NOTE — Miscellaneous (Signed)
Summary: [facility]  [facility] of [facility]   Imported By: Sherian Rein 09/06/2009 10:22:37  _____________________________________________________________________  External Attachment:    Type:   Image     Comment:   External Document

## 2010-05-24 NOTE — Miscellaneous (Signed)
Summary: [facility]  [facility]   Imported By: Sherian Rein 09/28/2009 13:27:18  _____________________________________________________________________  External Attachment:    Type:   Image     Comment:   External Document

## 2010-05-24 NOTE — Assessment & Plan Note (Signed)
Summary: feels a little dizzy.cd   Vital Signs:  Patient profile:   65 year old male Height:      64 inches Weight:      148.25 pounds BMI:     25.54 O2 Sat:      98 % on Room air Temp:     97.5 degrees F oral Pulse rate:   69 / minute BP sitting:   126 / 62  (left arm) Cuff size:   regular  Vitals Entered By: Zella Ball Ewing CMA Duncan Dull) (March 05, 2010 1:33 PM)  O2 Flow:  Room air  CC: Dizziness/RE   Primary Care Amita Atayde:  Oliver Barre, MD  CC:  Dizziness/RE.  History of Present Illness: here with 3-4 days onset dizziness noticed most with lying down and turning; no headache, falls , no injury, sinus symtpoms, ST , fever, cough and Pt denies CP, worsening sob, doe, wheezing, orthopnea, pnd, worsening LE edema, palps, dizziness or syncope  Pt denies new neuro symptoms such as headache, facial or extremity weakness ,  Does have recurrent LBP  s/p ESI x 4, none really helped for extended timeperiod, so now on higher dose gabapentin though not sure of the dose.  Was referred to PT but made him weaker adn couldnt walk with the ALS.  Pain now 7 or 8/10 .  No fever, wt loss, night sweats, loss of appetite or other constitutional symptoms  Denies polydipsia, polyuria.  Denies worsening depressive symptoms, suicidal ideation, or panic.    Problems Prior to Update: 1)  Vertigo  (ICD-780.4) 2)  Preventive Health Care  (ICD-V70.0) 3)  Allergic Rhinitis  (ICD-477.9) 4)  Als  (ICD-335.20) 5)  Cough  (ICD-786.2) 6)  Screening Colorectal-cancer  (ICD-V76.51) 7)  Special Screening Malig Neoplasms Other Sites  (ICD-V76.49) 8)  Fatigue  (ICD-780.79) 9)  Abdominal Pain, Generalized  (ICD-789.07) 10)  Allergic Conjunctivitis  (ICD-372.14) 11)  Thrush  (ICD-112.0) 12)  Dyspnea  (ICD-786.05) 13)  Hepatotoxicity, Drug-induced, Risk of  (ICD-V58.69) 14)  Glucose Intolerance  (ICD-271.3) 15)  Hyperlipidemia  (ICD-272.4) 16)  Low Back Pain  (ICD-724.2) 17)  Cough, Chronic  (ICD-786.2) 18)   Disorder, Myoneural Nec  (ICD-358.8) 19)  Allergic Rhinitis  (ICD-477.9) 20)  Hypertension  (ICD-401.9) 21)  Other Dysphagia  (ICD-787.29) 22)  Methicillin Resistant Staphylococcus Aureus Infection  (ICD-041.19) 23)  Insufficiency, Pulmonary Nec  (ICD-518.82)  Medications Prior to Update: 1)  Crestor 20 Mg Tabs (Rosuvastatin Calcium) .... Take 1 Tablet By Mouth Once A Day 2)  Methocarbamol 750 Mg Tabs (Methocarbamol) .... Take 1 Tablet By Mouth Two Times A Day As Needed 3)  Vitamin D 400 Unit  Tabs (Cholecalciferol) .... Take 1 Tablet By Mouth Once A Day 4)  Ecotrin Low Strength 81 Mg  Tbec (Aspirin) .... Take 1 Tablet By Mouth As Needed 5)  Amlodipine Besy-Benazepril Hcl 10-20 Mg  Caps (Amlodipine Besy-Benazepril Hcl) .[name].. 1 By Mouth Once Daily 6)  Cetirizine Hcl 10 Mg Tabs (Cetirizine Hcl) .[name].. 1 By Mouth Once Daily As Needed Allergies 7)  Omeprazole 20 Mg Cpdr (Omeprazole) .[name].. 1po Once Daily 8)  Rilutek 50 Mg Tabs (Riluzole) .[name].. 1 By Mouth Two Times A Day 9)  Tessalon Perles 100 Mg Caps (Benzonatate) .[name].. 1 - 2 By Mouth Three Times A Day As Needed 10)  Prednisone 10 Mg Tabs (Prednisone) .... 3po Qd For 3days, Then 2po Qd For 3days, Then 1po Qd For 3days, Then Stop  Current Medications (verified): 1)  Crestor 20 Mg  Tabs (Rosuvastatin Calcium) .... Take 1 Tablet By Mouth Once A Day 2)  Methocarbamol 750 Mg Tabs (Methocarbamol) .... Take 1 Tablet By Mouth Two Times A Day As Needed 3)  Vitamin D 400 Unit  Tabs (Cholecalciferol) .... Take 1 Tablet By Mouth Once A Day 4)  Ecotrin Low Strength 81 Mg  Tbec (Aspirin) .... Take 1 Tablet By Mouth As Needed 5)  Amlodipine Besy-Benazepril Hcl 10-20 Mg  Caps (Amlodipine Besy-Benazepril Hcl) .[name].. 1 By Mouth Once Daily 6)  Cetirizine Hcl 10 Mg Tabs (Cetirizine Hcl) .[name].. 1 By Mouth Once Daily As Needed Allergies 7)  Omeprazole 20 Mg Cpdr (Omeprazole) .[name].. 1po Once Daily 8)  Rilutek 50 Mg Tabs (Riluzole) .[name].. 1 By Mouth Two Times A Day 9)   Gapapentin 10)  Tramadol Hcl 50 Mg Tabs (Tramadol Hcl) .[name].. 1po Q 6 Hrs As Needed Pain 11)  Meclizine Hcl 12.5 Mg Tabs (Meclizine Hcl) .[name].. 1-2 By Mouth Q 6 Hrs As Needed Dizziness  Allergies (verified): No Known Drug Allergies  Past History:  Past Medical History: Last updated: 01/26/2009 H/O Acute Respiratory Distress requiring ventiator support and Tracheostomy/ARDS - 2001 H/O MRSA colonization Neurogenic Dysphagia Hypertension Allergic rhinitis Chronic Neuromuscular Disease Chronic Cough Low back pain lumbar DDD Hyperlipidemia glucose intolerance ALS  Past Surgical History: Last updated: 04/28/2008 Lipoma Removal - 2003 Tracheostomy Percutaneous Gastrostomy Tube Placement - 04/1999 and removed 07/1999  Social History: Last updated: 03/05/2010 Alcohol use-no widower - first wife died 4 children - all died in [place] in the 67's turmoil disabled emigrated to [place] in 1985  - 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   Head:  normocephalic and atraumatic.   Eyes:  vision grossly intact, pupils equal, and pupils round.   Ears:  bilat tm's mild red, sinus nontender Nose:  nasal dischargemucosal pallor and mucosal edema.   Mouth:  pharyngeal erythema and fair dentition.   Neck:  supple and no masses.   Lungs:  normal respiratory effort and normal breath sounds.   Heart:  normal rate and regular rhythm.   Msk:  spine nontender and no paravertebral tender or  sweling Extremities:  no edema, no erythema  Neurologic:  difficult exam due to ALS;  needs assist to get up on exam table due to chronic general weakness Skin:  no edema, no erythema    Impression & Recommendations:  Problem # 1:  ALLERGIC RHINITIS (ICD-477.9)  His updated medication list for this problem includes:    Cetirizine Hcl 10 Mg Tabs (Cetirizine hcl) .[name]... 1 by mouth once daily as needed allergies treat as above, f/u any worsening signs or symptoms  - re-start med  Problem # 2:  LOW BACK PAIN (ICD-724.2)  His updated medication list for this problem includes:    Methocarbamol 750 Mg Tabs (Methocarbamol) .[name]... Take 1 tablet by mouth two times a day as needed    Ecotrin Low Strength 81 Mg Tbec (Aspirin) .[name]... Take 1 tablet by mouth as needed  Tramadol Hcl 50 Mg Tabs (Tramadol hcl) .[name]... 1po q 6 hrs as needed pain sciatica type to LLE - for tramadol as needed   Problem # 3:  VERTIGO (ICD-780.4)  His updated medication list for this problem includes:    Cetirizine Hcl 10 Mg Tabs (Cetirizine hcl) .[name]... 1 by mouth once daily as needed allergies    Meclizine Hcl 12.5 Mg Tabs (Meclizine hcl) .[name]... 1-2 by mouth q 6 hrs as needed dizziness likely due to above - for meclizine as needed   Complete Medication List: 1)  Crestor 20 Mg Tabs (Rosuvastatin calcium) .... Take 1 tablet by mouth once a day 2)  Methocarbamol 750 Mg Tabs (Methocarbamol) .... Take 1 tablet by mouth two times a day as needed 3)  Vitamin D 400 Unit Tabs (Cholecalciferol) .... Take 1 tablet by mouth once a day 4)  Ecotrin Low Strength 81 Mg Tbec (Aspirin) .... Take 1 tablet by mouth as needed 5)  Amlodipine Besy-benazepril Hcl 10-20 Mg Caps (Amlodipine besy-benazepril hcl) .[name].. 1 by mouth once daily 6)  Cetirizine Hcl 10 Mg Tabs (Cetirizine hcl) .[name].. 1 by mouth once daily as needed allergies 7)  Omeprazole 20 Mg Cpdr (Omeprazole) .[name].. 1po once daily 8)  Rilutek 50 Mg Tabs (Riluzole) .[name].. 1 by mouth two times a  day 9)  Gapapentin  10)  Tramadol Hcl 50 Mg Tabs (Tramadol hcl) .[name].. 1po q 6 hrs as needed pain 11)  Meclizine Hcl 12.5 Mg Tabs (Meclizine hcl) .[name].. 1-2 by mouth q 6 hrs as needed dizziness  Patient Instructions: 1)  Please take all new medications as prescribed - the 3 medications were sent to your pharmacy 2)  Continue all previous medications as before this visit  3)  Please schedule a follow-up appointment in 3 months. Prescriptions: MECLIZINE HCL 12.5 MG TABS (MECLIZINE HCL) 1-2 by mouth q 6 hrs as needed dizziness  #60 x 2   Entered and Authorized by:   Corwin Levins MD   Signed by:   Corwin Levins MD on 03/05/2010   Method used:   Electronically to        [facility] [phone]* (retail)       [address], [place]  [address]       Ph: [phone] or [phone]       Fax: [phone]   RxID:   [id number] TRAMADOL HCL 50 MG TABS (TRAMADOL HCL) 1po q 6 hrs as needed pain  #120 x 2   Entered and Authorized by:   Corwin Levins MD   Signed by:   Corwin Levins MD on 03/05/2010   Method used:   Electronically to        [facility] [phone]* (retail)       [address] [place]  [address]       Ph: [phone] or [phone]       Fax: [phone]   RxID:   [id number] CETIRIZINE HCL 10 MG TABS (CETIRIZINE HCL) 1 by mouth once daily as needed allergies  #30 x 11   Entered and Authorized by:   Corwin Levins MD   Signed by:   Corwin Levins MD on 03/05/2010   Method used:   [facility]ronically to        [facility] [phone]* (retail)       [address] [place]  [address]       Ph: [phone] or [phone]       Fax: [phone]   RxID:   [id number]    Orders Added: 1)  Est. Patient Level IV [27253]

## 2010-06-05 ENCOUNTER — Ambulatory Visit: Payer: Self-pay | Admitting: Internal Medicine

## 2010-09-01 ENCOUNTER — Other Ambulatory Visit: Payer: Self-pay | Admitting: Internal Medicine

## 2010-09-04 NOTE — Assessment & Plan Note (Signed)
[facility]                                 ON-CALL NOTE   NAME:Thomas Raymond, Thomas Raymond                            MRN:          [id number]  DATE:09/18/2006                            DOB:          05/28/1945    TIME:  At 5:14 p.m.   TELEPHONE NUMBER:  [phone]   OBJECTIVE:  The patient needs prescriptions for blood pressure medicine.  He has enough for tonight and needs it for tomorrow. Has not tried to  call the office. Called the pharmacy and they told him to call his  doctor.   ASSESSMENT:  Hypertension with medicines needing refill.   PLAN:  Call in the morning to Dr.  Raphael Gibney office to have his medicine  refilled to that they can refill it tomorrow and he can have the  medicine for tomorrow night.   PRIMARY CARE PHYSICIAN:  Corwin Levins, MD and home office is at [facility], MD  Electronically Signed    RNS/MedQ  DD: 09/18/2006  DT: 09/18/2006  Job #: [id number]   cc:   Corwin Levins, MD

## 2010-09-07 NOTE — Discharge Summary (Signed)
[place]. [facility]  Patient:    Thomas Raymond, Thomas Raymond Visit Number: [id number] MRN: [id number]          Service Type: DSU Location: [phone] Attending Physician:  Cameron Proud Dictated by:   Sherrie George, P.A. Admit Date:  07/13/2001 Disc. Date: 07/15/01   CC:         Corwin Levins, M.D. [facility]  Ollen Gross. Vernell Morgans, M.D.   Discharge Summary  DATE OF BIRTH:  [birth year]  ADMISSION DIAGNOSES: 1. Right supraclavicular mass. 2. Hypertension. 3. History of pneumonia with ventilator-dependent respiratory failure.  DISCHARGE DIAGNOSES: 1. Right supraclavicular mass. 2. Hypertension. 3. History of pneumonia with ventilator-dependent respiratory failure.  PROCEDURES:  Excision of right supraclavicular lipoma.  BRIEF HISTORY:  The patient is a 65 year old [name] male medical patient of Dr. Ollen Gross. Toth and Dr. Oliver Barre, who has developed an enlarging 4 x 5 cm right supraclavicular mass.  It was Dr. Remo Lipps opinion it was a lipoma.  MRI showed extending down posteriorly along the scalene toward the phrenic nerve. He was admitted at this time for elective removal of this enlarging lipoma.  PAST MEDICAL HISTORY: 1. Chronic back pain. 2. History of dizziness and hypertension. 3. Status post tracheostomy placement for prolonged pneumonia and being    ventilator dependent back in the year 2000.  MEDICATIONS ON ADMISSION: 1. Naproxen 500 mg 1 b.i.d. 2. Meclizine 25 mg 1/2 tablet q.i.d. p.r.n. for dizziness. 3. Atacand 2 mg q.d. 4. Norvasc 5 mg q.d.  ALLERGIES:  None known.  For further History & Physical, please see dictated note.  HOSPITAL COURSE:  The patient was admitted, taken to the OR, and underwent excision of his right supraclavicular lipoma by Dr. Edwyna Shell.  He tolerated the procedure well.  On the following postop day, he looked good.  His J-P was removed.  His hematocrit was down to 37.  It was Dr. Remo Lipps opinion he could be  observed for another 24 hours.  If he continues to do well, he will be able to go home in the a.m., July 15, 2001.  The J-P drain is going to be removed today.  Admission hemoglobin was 14.6, hematocrit 42.8; postop on March 25, hemoglobin 12.4 with a hematocrit of 37.  White count is 7.6 postoperatively; it was 6.7 preop.  Electrolytes were normal postoperatively.  BUN is 9, creatinine 0.6, and glucose 103.  Final pathology is still pending at the time of discharge.  DISCHARGE MEDICATIONS:  He will resume his preadmission medications as before and has been given Tylox 1 to 2 p.o. q.4h. p.r.n.  FOLLOWUP:  He will return to see Dr. Edwyna Shell on Wednesday, April 2, at 4:30 p.m.  CONDITION UPON DISCHARGE:  Improving. Dictated by:   Sherrie George, P.A. Attending Physician:  Cameron Proud DD:  07/14/01 TD:  07/15/01 Job: [id number]

## 2010-09-07 NOTE — Op Note (Signed)
[place]. [facility]  Patient:    DENTON, DERKS Visit Number: [id number] MRN: [id number]          Service Type: DSU Location: [phone] Attending Physician:  Cameron Proud Dictated by:   D. Karle Plumber, M.D. Proc. Date: 07/13/01 Admit Date:  07/13/2001   CC:         Ollen Gross. Vernell Morgans, M.D.   Operative Report  PREOPERATIVE DIAGNOSIS: Right supraclavicular hematoma.  POSTOPERATIVE DIAGNOSIS: Right supraclavicular hematoma.  OPERATION/PROCEDURE: Excision of right supraclavicular hematoma.  SURGEON: D. Karle Plumber, M.D.  ASSISTANT: Dominica Severin, P.A.  ANESTHESIA: General.  DESCRIPTION OF PROCEDURE: After general anesthesia the patient was put in the supine position and the head was turned to the right.  A roll was placed underneath the shoulder to extend the head.  The patient had a large right supraclavicular hematoma that extended down to the base of the neck and into the apex of the lung.  A 6 cm incision was made over the area and dissection carried down through the subcutaneous tissue.  The platysma was reflected laterally.  Dissection was carried down to the capsule of the lipoma and this was opened.  There were multiple vessels around the lipoma.  These were clipped or Bovied or individually ligated with 2-0 silk.  The omohyoid was identified medially and you could see the internal jugular was the medial border and the lateral border was the scalene anticus and medius vessel.  This lipoma was really around the scalene fat pad and then went posteriorly down the base of the neck posteriorly.  Dissection was first started dissecting up the scalene nodes and out to the jugular.  They were dissected up with the specimen and then they were reflected laterally and then the capsule was entered and dissection was carried down, where the capsule then was dissected out with sharp and blunt dissection.  Dissection was carried all the way  down to the base of the lipoma, resecting the muscles laterally, medially, superiorly, and inferiorly.  When this was all dissected free, dissecting down to the base, when the base was identified the lipoma was then excised.  All bleeding was electrocoagulated.  A Jackson-Pratt was placed in the base of the wound and brought out through a separate stab wound.  The muscle layer was closed with interrupted 2-0 Dexon, subcutaneous tissue with 3-0 Dexon, and Dermabond was applied to the skin.  A compression dressing was placed on the wound.  The patient was returned to the recovery room in stable condition. Dictated by:   D. Karle Plumber, M.D. Attending Physician:  Cameron Proud DD:  07/13/01 TD:  07/14/01 Job: [id number]

## 2010-09-07 NOTE — Discharge Summary (Signed)
[place]. [facility]  Patient:    Thomas Raymond                             MRN: [id number] Adm. Date:  [id number] Attending:  Silvio Pate Dictator:   Earley Favor, RN, MSN, ACNP CC:         Colen Darling. Charmayne Sheer., M.D.             Gypsy Balsam, M.D.             Marlan Palau, M.D.             Jefry H. Pollyann Kennedy, M.D.             Venita Lick. Pleas Koch., M.D. [facility]                           Discharge Summary  DATE OF BIRTH:  1945/10/10.  DISCHARGE DIAGNOSES: 1. Acute respiratory distress syndrome with mechanical ventilatory support with    tracheostomy. 2. Neuromuscular disorder with neurogenic dysphagia. 3. Methicillin-resistant Staphylococcus aureus colonization.  PROCEDURES: 1. Nerve conduction study by Dr. Marlan Palau demonstrated findings consistent    with a chronic motor neuropathic state. 2. Tracheostomy, April 18, 1999, removed on April 30, 1999, performed by    Dr. Jeannett Senior. Rosen. 3. Percutaneous endoscopic tube placement on May 16, 1999 by    Dr. Judie Petit T. Pleas Koch., M.D. 4. Modified barium swallow, May 09, 1999, which demonstrated positive    aspiration and recommendation to hold all oral feedings. 5. Two-dimensional echo on April 02, 1999 demonstrated reduced ejection fraction    of 35% with left ventricular enlargement, left atrial enlargement, with a mild    tricuspid regurgitation. 6. Lower extremity Doppler studies on April 02, 1999 were negative for deep    venous thrombosis.  HISTORY OF PRESENT ILLNESS:  Mr. Lynnell Chad is a 65 year old [name] male who was referred by Dr. Colen Darling. Charmayne Sheer. at [facility] on March 31, 1999 or hypoxia and radiographic changes consistent with bilateral diffuse infiltrates. He was admitted in acute respiratory distress to [facility] by Dr. Marcelyn Bruins, pulmonary critical care division of [facility].  LABORATORY AND X-RAY FINDINGS:  Sodium  139, potassium 3.9, chloride 102, CO2 31, BUN 22, creatinine 0.4, glucose 110.  Hemoglobin 11.5, hematocrit 34.5, WBC 2.9 and platelets were 291,000.  INR was 0.9.  PTT is 31.  Arterial blood gas on FIO2 of 40%, 5 of PEEP, 16 of pressure support on April 22, 1999:  pH 7.48, PCO2 of 4, PO2 of 112.  Sputum on April 29, 1999 was positive for methicillin-resistant Staphylococcus aureus.  Twelve-lead EKG showed normal sinus rhythm.  Chest x-ray shows bibasilar atelectasis.  MRI of the brain on May 07, 1999 shows an ______ right caudate head and some left ethmoid sinus opacification.  HOSPITAL COURSE: #1 - ACUTE RESPIRATORY DISTRESS SYNDROME:  Mr. Lynnell Chad was admitted to [facility] on March 31, 1999 from [facility] with acute respiratory distress.  His pulmonary status quickly declined and he required orotracheal intubation on April 02, 1999 and mechanical ventilatory support.  He developed acute lung injury and rapidly progressed to a full-blown acute respiratory distress syndrome.  He required mechanical ventilatory support with different ventilator  techniques to maintain proper oxygenization.  He responded slowly to treatment. He required tracheostomy  on April 18, 1999 by Dr. Serena Colonel.  Chronic weaning  protocol was in place and he was liberated from the ventilator and from tracheostomy by April 30, 1999.  He no longer requires mechanical ventilatory support or supplemental oxygen.  His respiratory distress may be triggered by chronic aspiration secondary to a neuromuscular disorder; therefore, he is permanently n.p.o. with a PEG placement on May 16, 1999 by Dr. Judie Petit T. Pleas Koch.  His bolus tube feedings have been increased to 480 cc q.4h. while awake.  He also has ______ 50 mg q.12h. via PEG and ______  one teaspoon via PEG q.d. to help treat neurological disorder.  He will be transferred to [facility] for further  rehabilitation and treatment.  #2 - NEUROMUSCULAR DISORDER:  Dr. Marlan Palau of [facility] was consulted.  EMG demonstrated tendencies consistent with chronic motor neuropathy.  ______  and ______ have been prescribed.  Dr. Anne Hahn will follow this in outpatient treatment.  #3 - METHICILLIN-RESISTANT STAPHYLOCOCCUS AUREUS COLONIZATION:  Sputum was positive for methicillin-resistant Staphylococcus aureus on April 17, 1999, treated with appropriate antibiotics.  Sputum on April 29, 1999 again demonstrated MRSA in  nontoxic patient.  DISCHARGE MEDICATIONS: 1. He will be on Gevity Plus 480 cc via PEG q.4h., from 7 a.m. to 11 p.m.; n.p.o.    from 11 p.m. to 7 a.m.  He is to flush 60 cc of H2O via PEG following tube    feedings. 2. ______ 1 teaspoon q.d. via PEG, use liquid form. 3. ______ 50 mg q.12h. via PEG. 4. Zantac 150 mg via tube q.12h. 5. Tylenol elixir 650 mg per PEG q.6h. p.r.n.  DIET:  See medications.  FOLLOWUP:  He has a followup to be scheduled with Dr. Shelle Iron in four weeks.  SPECIAL INSTRUCTIONS:  Per standing orders of nursing home.  DISPOSITION/CONDITION ON DISCHARGE:  Acute respiratory distress syndrome has been resolved.  He is now with a PEG due to chronic aspiration, possibly secondary to a longstanding and chronic neuromuscular disorder. DD:  05/18/99 TD:  05/18/99 Job: [id number]

## 2010-09-07 NOTE — H&P (Signed)
[place]. [facility]  Patient:    Thomas Raymond                             MRN: [id number] Adm. Date:  [id number] Attending:  Silvio Pate CC:         Zenon Mayo, M.D., [facility]             Gypsy Balsam, M.D.             Barbaraann Share, M.D. [facility] at office                         History and Physical  HISTORY OF PRESENT ILLNESS:  The patient is a 65 year old [name] male who was referred from Dr. Aundria Rud at [facility] today for hypoxia and diffuse bilateral pulmonary infiltrates on chest x-ray.  The patient was in his usual state of health until he returned from a one-month trip to [place] approximately two weeks ago. He was home approximately three to four days and felt normal and then began to ave fevers, chills, as well as malaise.  This was followed by a minimal cough and minimal mucus production.  He was complaining of chest pressure across his whole chest with no obvious pleuritic component.  He had no hemoptysis.  He was seen y Dr. Donell Beers and was felt to possibly have a viral syndrome and was placed on flumadine as well as Naprosyn for the myalgias.  His shortness of breath came on and began to be quite progressive this week.  He developed acute distress today. He was seen by Dr. Aundria Rud today and was found to be hypoxemic and on chest x-ray with diffuse bilateral infiltrates.  He was then referred here.  The patient denies any history of TB or TB exposure.  He has no obvious environmental exposure. He does have some calf tenderness intermittently that has been a problem over the ast two weeks but no swelling.  There is a significant language barrier here.  PAST MEDICAL HISTORY:  Significant for borderline hypertension.  He is on medication for this, and he really does not know the name of it.  ALLERGIES:  No known drug allergies.  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  He has a history of smoking less than one-half a pack per day and has done so since the age of 37.  He denies alcohol intake.  He denies behavior predisposing to HIV infection.  FAMILY HISTORY:  His parents still live in [place].  He moved here in 1995. There are no obvious health problems that run in his family, but, again, it is very difficult to obtain the history.  REVIEW OF SYSTEMS:  As per History of Present Illness.  PHYSICAL EXAMINATION:  GENERAL:  He is a thin [name] male in mild to moderate respiratory distress.  VITAL SIGNS:  Blood pressure 155/87, pulse 86.  He is afebrile.  His respiratory rate is approximately 40.  HEENT:  Pupils are equal, round and reactive to light and accommodation. Extraocular muscles are intact.  Nares patent without discharge.  Oropharynx is  clear.  There is no obvious jaundice.  NECK:  Supple without JVD or thyromegaly.  There is no obvious lymphadenopathy;  however, there is a fullness in the right supraclavicular fossa which feels almost gelatinous in nature and is  not pulsatile.  He and the family were not aware that was there.  CHEST:  Bilateral crackles approximately two-thirds of the way up.  There are no wheezes or rhonchi.  CARDIAC:  Reveals a regular rate and rhythm.  No murmurs, rubs, or gallops.  ABDOMEN:  Soft, nontender with good bowel sounds.  GENITAL/RECTAL/BREASTS:  Exams not done and not indicated.  EXTREMITIES:  Lower extremities are without edema with good pulses distally. There is minimal calf tenderness down low.  There are no obvious cords or varicosities.  NEUROLOGIC:  The patient is alert and oriented x 3 and able to move all four extremities without obvious deficits.  LABORATORY DATA:  Chest x-ray revealed diffuse bilateral infiltrates with small  lung volumes and mild cardiomegaly.  There is no obvious lymphadenopathy, but it is very difficult to assess the hyla of this.  EKG  shows a sinus tachycardia with an incomplete right bundle branch block. There are no obvious ischemic changes.  Arterial blood gas on 4 liters shows a PO2 of 60, PC02 of 37, pH 7.45.  The sed  rate was 118.  His white blood ce11 count was 11.1 with a hemoglobin of ll.7. is MCV was normal.  His electrolytes were normal except for potassium of 3.3.  IMPRESSION: 1. Acute respiratory failure secondary to bilateral infiltrates of unknown    etiology.  It is unclear whether this is infectious and could be related to    tuberculosis, fungal infection, Legionella, routine bacteria, parasitic, or    pneumocystis.  Also need to consider inflammatory diseases such as eosinophilic    pneumonia or bronchiolitis obliterans with organizing pneumonia.  Finally, must    consider pulmonary edema with some mild cardiomegaly; however, it really does    not fit for this, and I doubt this is the case.  The patient is clearly hypoxic    and in respiratory distress and is at high risk for decompensation.  He will    need to be admitted to the stepdown ICU and placed on appropriate therapy. am    very hesitant at this time, despite the elevated sed rate, to start steroid o    cover BOOP until infection has been excluded. 2. Anemia of unknown etiology with a normal MCV.  PLAN: 1. Will admit for further workup, oxygen therapy as well as antibiotics. 2. High resolution CT of the chest with questionable bronchoscopy with biopsy. 3. PPD and control. 4. Absolute eosinophil count along with urinalysis, LDH, and CPK. 5. Check echo for completeness first think Monday morning. DD:  03/31/99 TD:  03/31/99 Job: [id number]

## 2010-09-07 NOTE — Op Note (Signed)
[place] [facility]  Patient:    Thomas Raymond                             MRN: [id number] Proc. Date: 04/18/99 Adm. Date:  [id number] Attending:  Silvio Pate                           Operative Report  PREOPERATIVE DIAGNOSIS:  Ventilator dependency.  POSTOPERATIVE DIAGNOSIS:  Ventilator dependency.  PROCEDURE:  Tracheostomy.  SURGEON:  Jefry H. Pollyann Kennedy, M.D.  ANESTHESIA:  General endotracheal.  COMPLICATIONS:  None.  DISPOSITION:  The patient tolerated the procedure and was returned to ICU in satisfactory condition.  HISTORY:  This is a 65 year old, intubated for several weeks for respiratory distress syndrome.  Failed attempted extubation over the weekend.  Risks, benefits, alternatives, and complications of the procedure were explained to the patient nd his wife who seemed to understand and agreed to surgery.  DESCRIPTION OF PROCEDURE:  The patient was taken to the operating room and placed on the operating table in the supine position.  Following induction of anesthesia, the oral endotracheal tube was previously in place.  The neck was prepped and draped in a standard fashion.  A 15 scalpel was used to incise the lower cervical skin in a vertical fashion anteriorly.  Electrocautery was used to dissect through the subcutaneous tissue and the superficial fascia.  The strap muscles were divided and the isthmus of the thyroid was divided using electrocautery.  The upper trachea was cleaned of overlying fascial tissue.  A tracheotomy flap was created between the second and third ring, and secured to the lower cervical skin with a chromic suture.  The endotracheal tube was removed under direct visualization and a #8 cuffed Shiley tracheostomy tube was secured without difficulty and secured in place using the Velcro strips and nylon suture to the cervical skin.  The patient was then transferred back to ICU in stable condition. DD:   04/18/99 TD:  04/19/99 Job: [id number]

## 2010-09-07 NOTE — H&P (Signed)
[place]. [facility]  Patient:    Thomas Raymond, Thomas Raymond Visit Number: [id number] MRN: [id number]          Service Type: Attending:  D. Karle Plumber, M.D. Dictated by:   Sherrie George, P.A. Adm. Date:  07/09/01                           History and Physical  REFERRING PHYSICIANS:  1. Corwin Levins, M.D.  2. Ollen Gross. Vernell Morgans, M.D.  CHIEF COMPLAINT: The patient is a 65 year old [name], who is referred by Dr. Carolynne Edouard for evaluation of an enlarged 4 x 5 cm right supraclavicular mass.  HISTORY OF PRESENT ILLNESS: It was apparently present approximately two years ago and was about 1 cm in diameter.  During the interim time it has continued to grow, with MRI showing it currently being 4 x 5 cm in diameter.  He was referred to CV/TS and Dr. Edwyna Shell.  MRI was obtained which shows what appears to be a lipoma extending down along the scalene muscle toward the phrenic nerve.  After evaluation by Dr. Edwyna Shell it was his opinion that it would be best to remove this mass, even though he believes this is simply a lipoma.  He is admitted electively at this time for 24 hour observation after excision of his right supraclavicular mass.  He denies any history of cough or sputum production.  He has no chills, no unexplained weight loss, no shortness of breath, no dyspnea on exertion, no PND.  He has no history of angina or arrhythmias, or peripheral edema.  No hemoptysis.  He has never had a stroke. He does have a history of pneumonia, see below.  REVIEW OF SYSTEMS: Also includes trouble sleeping.  His weight has been stable.  He denies any shortness of breath, any PND.  He has no cardiac history.  GI and GU are negative.  He has chronic back pain, which occurs with walking with pain in his hip and thigh.  PAST MEDICAL HISTORY:  1. History of hypertension.  2. Chronic back pain, with changes in his gait and pain especially severe     in the right thigh and hip.  3. He has also  had some trouble with dizziness and is treated with Meclozine,     and this seems to treat it well.  PAST SURGICAL HISTORY: Trach and PEG placement for prolonged ventilator-dependent pneumonia back in 2000 at [facility].  No other surgery is noted.  ADMISSION MEDICATIONS:  1. Naproxen 500 mg one b.i.d. for back pain.  2. Meclozine 25 mg 1/2 tablet q.i.d. p.r.n.  3. Atacand 32 mg one q.d.  4. Norvasc 5 mg q.d.  ALLERGIES: None known.  FAMILY HISTORY: His parents both died in [place].  They were in their 82s. He has some brothers and sisters who live in [place].  He does not know anything about their health.  SOCIAL HISTORY: He was apparently married when he was in [place] but his wife is still there, and he has no children.  Lives with family members here.  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  He denies alcohol.  He smoked less than a pack a day for approximately ten years and quit when he got sick two years ago.  PHYSICAL EXAMINATION:  VITAL SIGNS: Blood pressure 132/90 on the right, 120/80 on the left in the sitting position.  Pulse 86.  Respirations 16 and unlabored.  GENERAL: This is a 65 year old [name] male, in no acute distress, alert and oriented.  Speech is something of a problem because of his accent.  HEENT: Head normocephalic.  PERRLA.  EOMI.  Fundi not visualized.  Ears, nose, throat, and mouth grossly within normal limits.  NECK: Supple.  No JVD, no lymphadenopathy, no bruits.  He has a large palpable right subclavian mass that is nontender and mobile.  He has a well-healed trach site.  CHEST: Clear to auscultation and percussion.  Lungs, no wheezing, rhonchi, or rales.  No lymphadenopathy.  CARDIAC: Regular rate and rhythm.  No murmurs, rubs, or gallops.  Pulses +2 and equal throughout their distribution bilaterally.  ABDOMEN: Soft, nontender.  Positive bowel sounds.  No  palpable organomegaly. No bruits.  Well-healed left upper quadrant scar from PEG tube.  GU/RECTAL: Not performed, no medically indicated.  EXTREMITIES: No clubbing, cyanosis, or edema.  No ulcerations.  NEUROLOGIC: No focal deficits.  Gait is unsteady and secondary to his chronic back pain.  I could not ascertain exactly what caused his back pain.  IMPRESSION:  1. Right subclavian mass, probable lipoma.  2. Hypertension.  3. History of pneumonia with ventilator-dependent respiratory failure     requiring tracheostomy and percutaneous endoscopic gastrostomy tube     placement in 2000 at [facility].  PLAN: The patient will be admitted now for 24 hour observation after excision of right subclavicular lipoma. Dictated by:   Sherrie George, P.A. Attending:  D. Karle Plumber, M.D. DD:  07/09/01 TD:  07/10/01 Job: [id number]

## 2010-09-07 NOTE — Procedures (Signed)
[place]. [facility]  Patient:    Thomas Raymond                             MRN: [id number] Proc. Date: 05/16/99 Adm. Date:  [id number] Attending:  Silvio Pate CC:         Venita Lick. Pleas Koch., M.D. [facility]             Barbaraann Share, M.D. [facility]                           Procedure Report  PROCEDURE PERFORMED:  Percutaneous endoscopic gastrostomy tube placement.  ENDOSCOPIST:  Venita Lick. Pleas Koch., M.D. [facility]  REFERRING PHYSICIAN:  Dr. Marcelyn Bruins.  INDICATIONS:  The patient is a 65 year old male with a progressive neurologic disorder resulting in neurogenic dysphagia with aspiration risk.  PEG placement is requested for long term support.  PHYSICAL EXAMINATION:  Chest:  Clear to auscultation.  Cardiac:  Regular rate and rhythm without murmurs.  Neurologic:  Alert oriented x 3.  ANESTHESIA:  Versed 7.5 mg IV, fentanyl 50 mcg IV and pharyngeal cetacaine spray.  MONITORING:  Automated blood pressure monitor, pulse oximeter and cardiac monitor. Low flow oxygen was given by nasal cannula throughout the procedure.  The procedure was well tolerated with no immediate complications.  DESCRIPTION OF PROCEDURE:  After the nature of the procedure was discussed with the patient including discussion of its risks, benefits and alternatives, the patient consented to proceed.  He was then comfortably sedated in the supine position. The Olympus video endoscope was inserted in the posterior pharynx and the esophagus was intubated under direct visualization.  The esophagus had a normal appearance. he Z-line measured at 39 cm from the incisors.  The gastric body was normal.  On retroflex view, the angularis, the lesser curvature, cardia and fundus was also  normal.  The gastric antrum, gastric pylorus, duodenal bulb and descending duodenum were all normal.  The endoscope was withdrawn into the stomach and the stomach as maximally insufflated with air.  A  suitable site for PEG placement was determined. In the left upper quadrant there was excellent transillumination and one to one  motion with external abdominal palpation.  The skin was then prepped and draped in standard sterile fashion.  1% lidocaine was used to anesthetize the skin and subcutaneous abdominal tissue.  A 1 cm incision was made at the site of PEG placement.  A catheter was placed through the incision and into the gastric lumen in one pass.  The guide wire was placed through the catheter, snared by the endoscope and removed orally.  A Wilson-Cook PEG #24 was then pushed into position over the guide wire by standard technique.  The patient was reintubated and the  internal portion of the PEG was freely rotating and firmly approximated to the anterior wall of the gastric body.  No complications were noted.  The stomach was thoroughly suctioned and decompressed.  Video photographs were obtained and the  endoscope was withdrawn from the patient.  The external portion of the PEG was attached by standard protocol.  IMPRESSION: 1. Normal upper endoscopy. 2. Successful percutaneous endoscopic gastrostomy tube placement.  RECOMMENDATIONS:  Standard post-PEG orders per chart. DD:  05/16/99 TD:  05/16/99 Job: [id number]

## 2010-09-10 ENCOUNTER — Other Ambulatory Visit: Payer: Self-pay | Admitting: Internal Medicine

## 2010-10-11 ENCOUNTER — Other Ambulatory Visit: Payer: Self-pay | Admitting: Internal Medicine

## 2010-11-05 ENCOUNTER — Encounter: Payer: Self-pay | Admitting: Internal Medicine

## 2010-11-05 ENCOUNTER — Ambulatory Visit (INDEPENDENT_AMBULATORY_CARE_PROVIDER_SITE_OTHER): Payer: Medicare Other | Admitting: Internal Medicine

## 2010-11-05 ENCOUNTER — Telehealth: Payer: Self-pay | Admitting: Internal Medicine

## 2010-11-05 VITALS — BP 144/82 | HR 76 | Temp 97.3°F | Ht 64.0 in

## 2010-11-05 DIAGNOSIS — G1221 Amyotrophic lateral sclerosis: Secondary | ICD-10-CM

## 2010-11-05 DIAGNOSIS — H8110 Benign paroxysmal vertigo, unspecified ear: Secondary | ICD-10-CM

## 2010-11-05 MED ORDER — MECLIZINE HCL 12.5 MG PO TABS: 12.5000 mg | ORAL_TABLET | Freq: Three times a day (TID) | ORAL | Status: DC | PRN

## 2010-11-05 NOTE — Patient Instructions (Signed)
It was good to see you today. Use meclizine for dizzy symptoms - Your prescription(s) have been submitted to your pharmacy. Please take as directed and contact our office if you believe you are having problem(s) with the medication(s). If continue problems or unimproved with pills, call for referral to vestibular physical therapy as discussed Will let dr. Jonny Ruiz know about recliner request - he may need to see you

## 2010-11-05 NOTE — Progress Notes (Signed)
  Subjective:    Patient ID: Thomas Raymond, male    DOB: 04-25-1945, 65 y.o.   MRN: [id number]  HPI  complains of dizziness Hx same - prev successful tx with meclizine but out of meds for same Onset current symptoms 3 days ago Dizziness precipitated by rolling over in bed or sudden head movements associated with nausea but no vomiting, ear pain/pressure, tinnitus No recent head trauma, falls or med changes  Past Medical History  Diagnosis Date  . ALLERGIC RHINITIS 11/22/2006  . ALS 01/26/2009  . HYPERLIPIDEMIA 03/30/2007  . HYPERTENSION 11/22/2006  . VERTIGO   . LOW BACK PAIN     Review of Systems  Constitutional: Negative for fever.  HENT: Negative for ear pain, congestion, neck pain, postnasal drip, tinnitus and ear discharge.   Eyes: Negative for pain.  Musculoskeletal: Positive for gait problem.  Neurological: Positive for weakness. Negative for syncope, facial asymmetry, numbness and headaches.       Objective:   Physical Exam BP 144/82  Pulse 76  Temp(Src) 97.3 F (36.3 C) (Oral)  Ht 5\' 4"  (1.626 m)  SpO2 95%   Physical Exam  Constitutional:  Limited english but assisted by aide; oriented to person, place, and time. appears well-developed and well-nourished. No distress.  Neck: Normal range of motion. Neck supple. No JVD present. No thyromegaly present.  Eyes: no nystagmus, PERRL, EOMI Ears: B TMs clear, no erythema or effusion Cardiovascular: Normal rate, regular rhythm and normal heart sounds.  No murmur heard. Pulmonary/Chest: Effort normal and breath sounds normal. No respiratory distress. no wheezes.  Neurological: he is alert and oriented to person, place, and time. No cranial nerve deficit. Marked proximal BLE weakness with difficulty rising from chair (requires asst), no unilateral weakness Psychiatric: he has a normal mood and affect. behavior is normal. Judgment and thought content normal.        Assessment & Plan:  BPPV - remote hx same - hx and exam  consistent with same - erx meclizine done - if unimproved, refer for vestib rehab - pt/family understand same  ALS - pt/family request rx for mechanical recliner from [facility] to help home transfers and mobility - will forward to PCP to manage same given progression of chronic neuro dz - may need OV

## 2010-11-05 NOTE — Telephone Encounter (Signed)
Pt seen by me in OV earlier today - requests order for motorized recliner from [facility] due to progressive weakness in setting of ALS - will defer arrangemnt of same to PCP (JWJ) given progression of chronic med dz- d/w JWJ in person earlier today - thanks

## 2010-11-05 NOTE — Assessment & Plan Note (Signed)
Hx same - previously controlled with meclizine No red flags on hx, exam benign Renew antivert - if unimproved, pt to call for referral to vest rehab -

## 2010-11-06 NOTE — Telephone Encounter (Signed)
Ok for rx - can sharon provide printed rx - recliner lift chair - for code for ALS

## 2010-11-09 ENCOUNTER — Other Ambulatory Visit: Payer: Self-pay | Admitting: *Deleted

## 2010-11-09 DIAGNOSIS — G1221 Amyotrophic lateral sclerosis: Secondary | ICD-10-CM

## 2010-11-09 NOTE — Telephone Encounter (Signed)
RX request for recliner lift chair to [facility] [ALS ICD 335.20 entered].

## 2011-01-21 ENCOUNTER — Encounter: Payer: Self-pay | Admitting: Internal Medicine

## 2011-01-21 ENCOUNTER — Ambulatory Visit (INDEPENDENT_AMBULATORY_CARE_PROVIDER_SITE_OTHER): Payer: Medicare Other | Admitting: Internal Medicine

## 2011-01-21 ENCOUNTER — Ambulatory Visit (INDEPENDENT_AMBULATORY_CARE_PROVIDER_SITE_OTHER)
Admission: RE | Admit: 2011-01-21 | Discharge: 2011-01-21 | Disposition: A | Discharge status: Home / Self Care | Payer: Medicare Other | Source: Ambulatory Visit | Attending: Internal Medicine | Admitting: Internal Medicine

## 2011-01-21 VITALS — BP 160/82 | HR 67 | Temp 97.4°F | Ht 62.0 in

## 2011-01-21 DIAGNOSIS — Z23 Encounter for immunization: Secondary | ICD-10-CM

## 2011-01-21 DIAGNOSIS — R059 Cough, unspecified: Secondary | ICD-10-CM

## 2011-01-21 DIAGNOSIS — I10 Essential (primary) hypertension: Secondary | ICD-10-CM

## 2011-01-21 DIAGNOSIS — R05 Cough: Secondary | ICD-10-CM

## 2011-01-21 MED ORDER — GUAIFENESIN-CODEINE 100-10 MG/5ML PO SYRP: 5.0000 mL | ORAL_SOLUTION | Freq: Two times a day (BID) | ORAL | Status: DC | PRN

## 2011-01-21 MED ORDER — LEVOFLOXACIN 500 MG PO TABS: 500.0000 mg | ORAL_TABLET | Freq: Every day | ORAL | Status: AC

## 2011-01-21 MED ORDER — ATENOLOL 50 MG PO TABS: 50.0000 mg | ORAL_TABLET | Freq: Every day | ORAL | Status: DC

## 2011-01-21 NOTE — Progress Notes (Signed)
  Subjective:    Patient ID: Thomas Raymond, male    DOB: Sep 03, 1945, 65 y.o.   MRN: [id number]  HPI complains of cough Onset 2 months ago Described as dry - no sputum Not associated with fever, shortness of breath, dyspnea on exertion or swelling - no wheeze symptoms all day but worse when supine/at night  Past Medical History  Diagnosis Date  . ALLERGIC RHINITIS   . ALS   . HYPERLIPIDEMIA   . HYPERTENSION   . VERTIGO   . LOW BACK PAIN     Review of Systems  Constitutional: Negative for chills and fatigue.  HENT: Negative for neck pain.   Cardiovascular: Negative for chest pain.  Neurological: Negative for numbness and headaches.       Objective:   Physical Exam BP 160/82  Pulse 67  Temp(Src) 97.4 F (36.3 C) (Oral)  Ht 5\' 2"  (1.575 m)  SpO2 95%  Constitutional:  Limited english but assisted by spouse; appears well-developed and well-nourished. No distress.  Neck: Normal range of motion. Neck supple. No JVD present. No thyromegaly present.  Cardiovascular: Normal rate, regular rhythm and normal heart sounds.  No murmur heard. no BLE edema Pulmonary/Chest: Effort normal and breath sounds diminished at L base. No respiratory distress. no wheezes.  Neurological: he is alert and oriented to person, place, and time. No cranial nerve deficit. Marked proximal BLE weakness with difficulty rising from chair (requires asst), no unilateral weakness Psychiatric: he has a normal mood and affect. behavior is normal. Judgment and thought content normal.   Lab Results  Component Value Date   WBC 7.6 01/26/2009   HGB 13.3 01/26/2009   HCT 40.3 01/26/2009   PLT 281.0 01/26/2009   CHOL 145 01/26/2009   TRIG 97.0 01/26/2009   HDL 49.90 01/26/2009   LDLDIRECT 126.4 09/22/2006   ALT 39 01/26/2009   AST 40* 01/26/2009   NA 146* 01/26/2009   K 4.5 01/26/2009   CL 109 01/26/2009   CREATININE 0.4 01/26/2009   BUN 10 01/26/2009   CO2 28 01/26/2009   TSH 0.67 01/26/2009   PSA 0.22 01/26/2009   HGBA1C 5.9  01/26/2009       Assessment & Plan:  Cough - dry but diminished BS in L base - O2 sat ok and VSS - check CXR r/o aspiration PNA (neuro dz, chronic without exac) - empiric antibiotics and cough suppression prn

## 2011-01-21 NOTE — Assessment & Plan Note (Signed)
Uncontrolled - may need to consider dc ACEI depending on course of cough (see above) Add atenolol qhs to help control blood pressure  Recheck PCP in 4 weeks to titrate/adjsut prn BP Readings from Last 3 Encounters:  01/21/11 160/82  11/05/10 144/82  03/05/10 126/62

## 2011-01-21 NOTE — Patient Instructions (Signed)
It was good to see you today. Chest xray ordered today. Your results will be called to you after review (48-72hours after test completion). If any changes need to be made, you will be notified at that time. Start antibiotics (levaquin) and cough syrup for cough symptoms - Your prescription(s) have been submitted to your pharmacy. Please take as directed and contact our office if you believe you are having problem(s) with the medication(s). Also start atenolol for blood pressure control - take in addition to current medications Please schedule followup in 4 weeks with Dr. Jonny Ruiz to recheck blood pressure, call sooner if problems.

## 2011-01-29 ENCOUNTER — Other Ambulatory Visit: Payer: Self-pay

## 2011-01-29 DIAGNOSIS — I10 Essential (primary) hypertension: Secondary | ICD-10-CM

## 2011-01-29 NOTE — Telephone Encounter (Signed)
Pharmacy states pt is requesting refill because she is still coughing, please advise.

## 2011-01-30 MED ORDER — GUAIFENESIN-CODEINE 100-10 MG/5ML PO SYRP: 5.0000 mL | ORAL_SOLUTION | Freq: Two times a day (BID) | ORAL | Status: DC | PRN

## 2011-01-30 NOTE — Telephone Encounter (Signed)
Ok - if continued problems after this refill, should see PCP

## 2011-01-30 NOTE — Telephone Encounter (Signed)
Rx faxed to pharmacy  

## 2011-02-17 ENCOUNTER — Encounter: Payer: Self-pay | Admitting: Internal Medicine

## 2011-02-17 DIAGNOSIS — R7302 Impaired glucose tolerance (oral): Secondary | ICD-10-CM

## 2011-02-17 DIAGNOSIS — Z0001 Encounter for general adult medical examination with abnormal findings: Secondary | ICD-10-CM | POA: Insufficient documentation

## 2011-02-17 DIAGNOSIS — Z0000 Encounter for general adult medical examination without abnormal findings: Secondary | ICD-10-CM | POA: Insufficient documentation

## 2011-02-17 HISTORY — DX: Impaired glucose tolerance (oral): R73.02

## 2011-02-19 ENCOUNTER — Ambulatory Visit (INDEPENDENT_AMBULATORY_CARE_PROVIDER_SITE_OTHER): Payer: Medicare Other | Admitting: Internal Medicine

## 2011-02-19 ENCOUNTER — Encounter: Payer: Self-pay | Admitting: Internal Medicine

## 2011-02-19 ENCOUNTER — Other Ambulatory Visit (INDEPENDENT_AMBULATORY_CARE_PROVIDER_SITE_OTHER): Payer: Medicare Other

## 2011-02-19 VITALS — BP 120/70 | HR 53 | Temp 97.5°F | Wt 125.0 lb

## 2011-02-19 DIAGNOSIS — R7309 Other abnormal glucose: Secondary | ICD-10-CM

## 2011-02-19 DIAGNOSIS — R5383 Other fatigue: Secondary | ICD-10-CM

## 2011-02-19 DIAGNOSIS — R7302 Impaired glucose tolerance (oral): Secondary | ICD-10-CM

## 2011-02-19 DIAGNOSIS — R5381 Other malaise: Secondary | ICD-10-CM

## 2011-02-19 DIAGNOSIS — I10 Essential (primary) hypertension: Secondary | ICD-10-CM

## 2011-02-19 DIAGNOSIS — E785 Hyperlipidemia, unspecified: Secondary | ICD-10-CM

## 2011-02-19 DIAGNOSIS — N320 Bladder-neck obstruction: Secondary | ICD-10-CM | POA: Insufficient documentation

## 2011-02-19 DIAGNOSIS — G7089 Other specified myoneural disorders: Secondary | ICD-10-CM

## 2011-02-19 LAB — URINALYSIS, ROUTINE W REFLEX MICROSCOPIC
Leukocytes, UA: NEGATIVE
Nitrite: NEGATIVE
Specific Gravity, Urine: 1.025 (ref 1.000–1.030)
Urobilinogen, UA: 0.2 (ref 0.0–1.0)
pH: 6 (ref 5.0–8.0)

## 2011-02-19 LAB — CBC WITH DIFFERENTIAL/PLATELET
Basophils Absolute: 0.1 10*3/uL (ref 0.0–0.1)
Eosinophils Absolute: 0.2 10*3/uL (ref 0.0–0.7)
Hemoglobin: 13.4 g/dL (ref 13.0–17.0)
Lymphocytes Relative: 17.4 % (ref 12.0–46.0)
Lymphs Abs: 1.7 10*3/uL (ref 0.7–4.0)
MCHC: 33.2 g/dL (ref 30.0–36.0)
Neutro Abs: 7.5 10*3/uL (ref 1.4–7.7)
RDW: 14.5 % (ref 11.5–14.6)

## 2011-02-19 LAB — BASIC METABOLIC PANEL
CO2: 29 mEq/L (ref 19–32)
Calcium: 9.5 mg/dL (ref 8.4–10.5)
Chloride: 103 mEq/L (ref 96–112)
Potassium: 4.3 mEq/L (ref 3.5–5.1)
Sodium: 139 mEq/L (ref 135–145)

## 2011-02-19 LAB — HEPATIC FUNCTION PANEL
AST: 48 U/L — ABNORMAL HIGH (ref 0–37)
Alkaline Phosphatase: 72 U/L (ref 39–117)
Bilirubin, Direct: 0.1 mg/dL (ref 0.0–0.3)
Total Protein: 7.9 g/dL (ref 6.0–8.3)

## 2011-02-19 LAB — LIPID PANEL
LDL Cholesterol: 68 mg/dL (ref 0–99)
Total CHOL/HDL Ratio: 3
Triglycerides: 115 mg/dL (ref 0.0–149.0)

## 2011-02-19 MED ORDER — METHOCARBAMOL 500 MG PO TABS: 500.0000 mg | ORAL_TABLET | Freq: Two times a day (BID) | ORAL | Status: AC | PRN

## 2011-02-19 MED ORDER — GUAIFENESIN-CODEINE 100-10 MG/5ML PO SYRP: 5.0000 mL | ORAL_SOLUTION | Freq: Two times a day (BID) | ORAL | Status: DC | PRN

## 2011-02-19 MED ORDER — RILUZOLE 50 MG PO TABS: 50.0000 mg | ORAL_TABLET | Freq: Four times a day (QID) | ORAL | Status: DC | PRN

## 2011-02-19 MED ORDER — AMLODIPINE BESY-BENAZEPRIL HCL 10-20 MG PO CAPS: 1.0000 | ORAL_CAPSULE | Freq: Every day | ORAL | Status: DC

## 2011-02-19 MED ORDER — ATENOLOL 50 MG PO TABS: 50.0000 mg | ORAL_TABLET | Freq: Every day | ORAL | Status: DC

## 2011-02-19 MED ORDER — CETIRIZINE HCL 10 MG PO TABS: 10.0000 mg | ORAL_TABLET | Freq: Every day | ORAL | Status: DC

## 2011-02-19 MED ORDER — OMEPRAZOLE 20 MG PO CPDR: 20.0000 mg | DELAYED_RELEASE_CAPSULE | Freq: Every day | ORAL | Status: DC

## 2011-02-19 MED ORDER — ROSUVASTATIN CALCIUM 20 MG PO TABS: 20.0000 mg | ORAL_TABLET | Freq: Every day | ORAL | Status: DC

## 2011-02-19 MED ORDER — TRAMADOL HCL 50 MG PO TABS: 50.0000 mg | ORAL_TABLET | Freq: Four times a day (QID) | ORAL | Status: DC | PRN

## 2011-02-19 NOTE — Progress Notes (Signed)
Subjective:    Patient ID: Thomas Raymond, male    DOB: Feb 23, 1946, 65 y.o.   MRN: [id number]  HPI  Here to f/u; overall doing ok,  Pt denies chest pain, increased sob or doe, wheezing, orthopnea, PND, increased LE swelling, palpitations, dizziness or syncope.  Pt denies new neurological symptoms such as new headache, or facial or extremity weakness or numbness   Pt denies polydipsia, polyuria, or low sugar symptoms such as weakness or confusion improved with po intake.  Pt states overall good compliance with meds, trying to follow lower cholesterol, diabetic diet, wt overall stable but little exercise however.  Does have sense of ongoing fatigue, but denies signficant hypersomnolence.  Has seen neuro before/Dr [facility], but no longer wants to see as not sure tx helps and was suggested to be in wheelchair which he declined and still walks with cane. Last seen about 1 yr ago.  Would like to change to new neurologist.  Past Medical History  Diagnosis Date  . ALLERGIC RHINITIS   . ALS   . HYPERLIPIDEMIA   . HYPERTENSION   . VERTIGO   . LOW BACK PAIN   . Impaired glucose tolerance 02/17/2011   Past Surgical History  Procedure Date  . Lipoma removal 2003  . Tracheostomy   . Percutaneous gastrostomy tube repalcement     04/1999 and removed 07/1999    has an unknown smoking status. He does not have any smokeless tobacco history on file. His alcohol and drug histories not on file. family history is not on file. No Known Allergies Current Outpatient Prescriptions on File Prior to Visit  Medication Sig Dispense Refill  . amLODipine-benazepril (LOTREL) 10-20 MG per capsule Take 1 capsule by mouth daily.  90 capsule  3  . aspirin 81 MG tablet Take 81 mg by mouth daily.        . cetirizine (ZYRTEC) 10 MG tablet Take 1 tablet (10 mg total) by mouth daily.  90 tablet  3  . meclizine (ANTIVERT) 12.5 MG tablet Take 1 tablet (12.5 mg total) by mouth 3 (three) times daily as needed for dizziness or nausea.   30 tablet  1  . omeprazole (PRILOSEC) 20 MG capsule Take 1 capsule (20 mg total) by mouth daily.  90 capsule  3  . riluzole (RILUTEK) 50 MG tablet Take 1 tablet (50 mg total) by mouth every 6 (six) hours as needed.  180 tablet  3  . rosuvastatin (CRESTOR) 20 MG tablet Take 1 tablet (20 mg total) by mouth daily.  90 tablet  3  . traMADol (ULTRAM) 50 MG tablet Take 1 tablet (50 mg total) by mouth every 6 (six) hours as needed.  120 tablet  5  . vitamin E 400 UNIT capsule Take 400 Units by mouth daily.         Review of Systems Review of Systems  Constitutional: Negative for diaphoresis and unexpected weight change.  HENT: Negative for drooling and tinnitus.   Eyes: Negative for photophobia and visual disturbance.  Respiratory: Negative for choking and stridor.   Gastrointestinal: Negative for vomiting and blood in stool.  Genitourinary: Negative for hematuria and decreased urine volume.  Musculoskeletal: Negative for gait problem.    Objective:   Physical Exam BP 120/70  Pulse 53  Temp(Src) 97.5 F (36.4 C) (Oral)  Wt 125 lb (56.7 kg)  SpO2 95% Physical Exam  VS noted Constitutional: Pt appears well-developed and well-nourished.  HENT: Head: Normocephalic.  Right Ear: External ear normal.  Left Ear: External ear normal.  Eyes: Conjunctivae and EOM are normal. Pupils are equal, round, and reactive to light.  Neck: Normal range of motion. Neck supple.  Cardiovascular: Normal rate and regular rhythm.   Pulmonary/Chest: Effort normal and breath sounds normal.  Abd:  Soft, NT, non-distended, + BS Neurological: Pt is alert. No cranial nerve deficit. o/d not done in detail Skin: Skin is warm. No erythema.  Psychiatric: Pt behavior is normal. Thought content normal.     Assessment & Plan:

## 2011-02-19 NOTE — Assessment & Plan Note (Signed)
Etiology unclear, Exam otherwise benign, to check labs as documented, follow with expectant management  

## 2011-02-19 NOTE — Assessment & Plan Note (Signed)
Needs regular neurology f/u most likely - will refer to [facility] per pt reqeust

## 2011-02-19 NOTE — Patient Instructions (Signed)
Continue all other medications as before Please go to LAB in the Basement for the blood and/or urine tests to be done today Please call the phone number [phone] (the [facility] System) for results of testing in 2-3 days;  When calling, simply dial the number, and when prompted enter the MRN number above (the Medical Record Number) and the # key, then the message should start. You will be contacted regarding the referral for: neurology at [facility]

## 2011-02-19 NOTE — Assessment & Plan Note (Signed)
Also for ua and PSA as he is due, asympt,  to f/u any worsening symptoms or concerns

## 2011-02-21 ENCOUNTER — Encounter: Payer: Self-pay | Admitting: Internal Medicine

## 2011-02-21 NOTE — Assessment & Plan Note (Signed)
stable overall by hx and exam, most recent data reviewed with pt, and pt to continue medical treatment as before  BP Readings from Last 3 Encounters:  02/19/11 120/70  01/21/11 160/82  11/05/10 144/82

## 2011-02-21 NOTE — Assessment & Plan Note (Signed)
stable overall by hx and exam, most recent data reviewed with pt, and pt to continue medical treatment as before  Lab Results  Component Value Date   LDLCALC 68 02/19/2011

## 2011-02-21 NOTE — Assessment & Plan Note (Signed)
Minor in the past, stable overall by hx and exam, most recent data reviewed with pt, and pt to continue medical treatment as before Lab Results  Component Value Date   HGBA1C 5.3 02/19/2011

## 2011-02-25 ENCOUNTER — Ambulatory Visit: Payer: Medicare Other | Admitting: Neurology

## 2011-03-12 ENCOUNTER — Ambulatory Visit: Payer: Medicare Other | Admitting: Neurology

## 2011-03-13 ENCOUNTER — Other Ambulatory Visit: Payer: Self-pay | Admitting: Internal Medicine

## 2011-03-15 ENCOUNTER — Ambulatory Visit (INDEPENDENT_AMBULATORY_CARE_PROVIDER_SITE_OTHER): Payer: Medicare Other | Admitting: Internal Medicine

## 2011-03-15 ENCOUNTER — Encounter: Payer: Self-pay | Admitting: Internal Medicine

## 2011-03-15 VITALS — BP 118/70 | HR 61 | Temp 97.1°F

## 2011-03-15 DIAGNOSIS — J309 Allergic rhinitis, unspecified: Secondary | ICD-10-CM

## 2011-03-15 DIAGNOSIS — I10 Essential (primary) hypertension: Secondary | ICD-10-CM

## 2011-03-15 DIAGNOSIS — G1221 Amyotrophic lateral sclerosis: Secondary | ICD-10-CM

## 2011-03-15 DIAGNOSIS — R42 Dizziness and giddiness: Secondary | ICD-10-CM

## 2011-03-15 MED ORDER — METHYLPREDNISOLONE ACETATE 80 MG/ML IJ SUSP
120.0000 mg | Freq: Once | INTRAMUSCULAR | Status: AC
  Administered 2011-03-15: 120 mg via INTRAMUSCULAR

## 2011-03-15 MED ORDER — PREDNISONE 10 MG PO TABS: ORAL_TABLET | ORAL | Status: DC

## 2011-03-15 MED ORDER — MECLIZINE HCL 25 MG PO TABS: 25.0000 mg | ORAL_TABLET | Freq: Three times a day (TID) | ORAL | Status: DC | PRN

## 2011-03-15 NOTE — Assessment & Plan Note (Signed)
Mild to mod flare, prob seasonal, with signficant congestion, left eustachian valve symtpoms and mild worsening vertigo;  Today for depomedrol IM, predpack for home (low dose, short course), mucinex otc prn, and increased meclizine to 25 mg prn (watch for sedation)

## 2011-03-15 NOTE — Patient Instructions (Signed)
You had the steroid shot today OK to increase the meclizine for dizziness to 25 mg as needed (watch for sleepiness though) Take all new medications as prescribed - the prednisone You can also take Mucinex (or it's generic off brand) for congestion, left ear fullness and ringing Continue all other medications as before Please keep your appointments with your specialists as you have planned [facility]

## 2011-03-16 ENCOUNTER — Encounter: Payer: Self-pay | Admitting: Internal Medicine

## 2011-03-16 NOTE — Assessment & Plan Note (Signed)
Has f/u appt with neurology dec 2012, Continue all other medications as before

## 2011-03-16 NOTE — Assessment & Plan Note (Signed)
stable overall by hx and exam, most recent data reviewed with pt, and pt to continue medical treatment as before  BP Readings from Last 3 Encounters:  03/15/11 118/70  02/19/11 120/70  01/21/11 160/82

## 2011-03-16 NOTE — Assessment & Plan Note (Signed)
Ok to increase to 25 mg prn,  to f/u any worsening symptoms or concerns

## 2011-03-16 NOTE — Progress Notes (Signed)
Subjective:    Patient ID: Thomas Raymond, male    DOB: 1945/08/24, 65 y.o.   MRN: [id number]  HPI  Here with hx of ALS and BPV, with  Does have several wks ongoing nasal allergy symptoms with clear congestion, itch and sneeze, without fever, pain, ST, cough or wheezing, but does have left ear popping.crackling recurrent.  Does have acute worsening of positional vertigo this time not responding to meclizine 12.5 mg as before.  Pt denies chest pain, increased sob or doe, wheezing, orthopnea, PND, increased LE swelling, palpitations, dizziness or syncope.  Pt denies new neurological symptoms such as new headache, or facial or extremity weakness or numbness   Pt denies polydipsia, polyuria, Pt states overall good compliance with meds, trying to follow lower cholesterol, diabetic diet, wt overall stable but little exercise however.   Overall good compliance with treatment, and good medicine tolerability, including the zyrtec.  Does not want nasal spray with hx of recent nose bleed. Fortunately though with severe neuro disease he has little falls, but did have a fall backwards 2 days ago, fortunately without injury. Past Medical History  Diagnosis Date  . ALLERGIC RHINITIS   . ALS   . HYPERLIPIDEMIA   . HYPERTENSION   . VERTIGO   . LOW BACK PAIN   . Impaired glucose tolerance 02/17/2011   Past Surgical History  Procedure Date  . Lipoma removal 2003  . Tracheostomy   . Percutaneous gastrostomy tube repalcement     04/1999 and removed 07/1999    has an unknown smoking status. He does not have any smokeless tobacco history on file. His alcohol and drug histories not on file. family history is not on file. No Known Allergies Current Outpatient Prescriptions on File Prior to Visit  Medication Sig Dispense Refill  . amLODipine-benazepril (LOTREL) 10-20 MG per capsule Take 1 capsule by mouth daily.  90 capsule  3  . aspirin 81 MG tablet Take 81 mg by mouth daily.        [name] atenolol (TENORMIN) 50 MG tablet  Take 1 tablet (50 mg total) by mouth at bedtime.  90 tablet  3  . cetirizine (ZYRTEC) 10 MG tablet Take 1 tablet (10 mg total) by mouth daily.  90 tablet  3  . guaiFENesin-codeine (ROBITUSSIN AC) 100-10 MG/5ML syrup Take 5 mLs by mouth 2 (two) times daily as needed for cough.  240 mL  0  . meclizine (ANTIVERT) 12.5 MG tablet TAKE 1 TABLET BY MOUTH 3 TIMES A DAY AS NEEDED DIZZINESS OR NAUSEA  30 tablet  1  . omeprazole (PRILOSEC) 20 MG capsule Take 1 capsule (20 mg total) by mouth daily.  90 capsule  3  . riluzole (RILUTEK) 50 MG tablet Take 1 tablet (50 mg total) by mouth every 6 (six) hours as needed.  180 tablet  3  . rosuvastatin (CRESTOR) 20 MG tablet Take 1 tablet (20 mg total) by mouth daily.  90 tablet  3  . traMADol (ULTRAM) 50 MG tablet Take 1 tablet (50 mg total) by mouth every 6 (six) hours as needed.  120 tablet  5  . vitamin E 400 UNIT capsule Take 400 Units by mouth daily.         Review of Systems Review of Systems  Constitutional: Negative for diaphoresis and unexpected weight change.  HENT: Negative for drooling and tinnitus.   Eyes: Negative for photophobia and visual disturbance.  Respiratory: Negative for choking and stridor.   Gastrointestinal: Negative for vomiting and  blood in stool.  Genitourinary: Negative for hematuria and decreased urine volume.     Objective:   Physical Exam BP 118/70  Pulse 61  Temp(Src) 97.1 F (36.2 C) (Oral)  SpO2 94% Physical Exam  VS noted Constitutional: Pt appears well-developed and well-nourished.  HENT: Head: Normocephalic.  Right Ear: External ear normal.  Left Ear: External ear normal.  Bilat tm's mild erythema.  Sinus nontender.  Pharynx mild erythema Eyes: Conjunctivae and EOM are normal. Pupils are equal, round, and reactive to light.  Neck: Normal range of motion. Neck supple.  Cardiovascular: Normal rate and regular rhythm.   Pulmonary/Chest: Effort normal and breath sounds normal.  Skin: Skin is warm. No erythema.    Psychiatric: Pt behavior is normal. Thought content normal.     Assessment & Plan:

## 2011-04-08 ENCOUNTER — Other Ambulatory Visit: Payer: Self-pay | Admitting: Internal Medicine

## 2011-04-11 ENCOUNTER — Other Ambulatory Visit: Payer: Self-pay

## 2011-04-11 ENCOUNTER — Other Ambulatory Visit: Payer: Self-pay | Admitting: Internal Medicine

## 2011-04-11 ENCOUNTER — Ambulatory Visit (INDEPENDENT_AMBULATORY_CARE_PROVIDER_SITE_OTHER): Payer: Medicare Other | Admitting: Neurology

## 2011-04-11 ENCOUNTER — Encounter: Payer: Self-pay | Admitting: Neurology

## 2011-04-11 VITALS — BP 136/78 | HR 56 | Wt 147.0 lb

## 2011-04-11 DIAGNOSIS — G1221 Amyotrophic lateral sclerosis: Secondary | ICD-10-CM

## 2011-04-11 MED ORDER — ROSUVASTATIN CALCIUM 20 MG PO TABS: 20.0000 mg | ORAL_TABLET | Freq: Every day | ORAL | Status: DC

## 2011-04-11 NOTE — Progress Notes (Signed)
Dear Dr. Jonny Raymond,  Thank you for having me see Thomas Raymond in consultation today at [facility] for his problem with diffuse muscle weakness.  As you may recall, he is a 65 y.o. 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  Unfortunately the history is difficult due the patient's poor understanding of english and my inability to speak [name].  I was able to glean that he used to see Dr. Lesia Raymond but did not see him for the last 5 years.  According to the patient, he said that "nothing could be done for him".  At one time he had an EMG/NCS the results not being known to me.  He also suffers from severe back pain with shooting pain down his leg that has been treated with multiple epidural steroid injections.  He denies difficulty swallowing.  Past Medical History  Diagnosis Date  . ALLERGIC RHINITIS   . ALS   . HYPERLIPIDEMIA   . HYPERTENSION   . VERTIGO   . LOW BACK PAIN   . Impaired glucose tolerance 02/17/2011    Past Surgical History  Procedure Date  . Lipoma removal 2003  . Tracheostomy   . Percutaneous gastrostomy tube repalcement     04/1999 and removed 07/1999    History   Social History  . Marital Status: Divorced    Spouse Name: N/A    Number of Children: N/A  . Years of Education: N/A   Social History Main Topics  . Smoking status: Never Smoker   . Smokeless tobacco: Never Used  . Alcohol Use: No  . Drug Use: None  . Sexually Active: None   Other Topics Concern  . None   Social History Narrative  . None    No family history on file.  Current Outpatient Prescriptions on File Prior to Visit  Medication Sig Dispense Refill  . amLODipine-benazepril (LOTREL) 10-20 MG per capsule Take 1 capsule by mouth daily.  90 capsule  3  . atenolol (TENORMIN) 50 MG tablet Take 1 tablet (50 mg total) by mouth at bedtime.  90 tablet  3  . cetirizine (ZYRTEC) 10 MG tablet Take 1  tablet (10 mg total) by mouth daily.  90 tablet  3  . meclizine (ANTIVERT) 25 MG tablet Take 1 tablet (25 mg total) by mouth 3 (three) times daily as needed for dizziness or nausea.  60 tablet  1  . omeprazole (PRILOSEC) 20 MG capsule Take 1 capsule (20 mg total) by mouth daily.  90 capsule  3  . riluzole (RILUTEK) 50 MG tablet Take 1 tablet (50 mg total) by mouth every 6 (six) hours as needed.  180 tablet  3  . rosuvastatin (CRESTOR) 20 MG tablet Take 1 tablet (20 mg total) by mouth daily.  90 tablet  3  . traMADol (ULTRAM) 50 MG tablet Take 1 tablet (50 mg total) by mouth every 6 (six) hours as needed.  120 tablet  5  . vitamin E 400 UNIT capsule Take 400 Units by mouth daily.        [name] aspirin 81 MG tablet Take 81 mg by mouth daily.        [name] guaiFENesin-codeine (ROBITUSSIN AC) 100-10 MG/5ML syrup Take 5 mLs by mouth 2 (two) times daily as needed for cough.  240 mL  0  . meclizine (ANTIVERT) 12.5 MG tablet TAKE 1 TABLET BY MOUTH 3 TIMES A DAY AS NEEDED  DIZZINESS OR NAUSEA  30 tablet  1  . predniSONE (DELTASONE) 10 MG tablet 3 tabs by mouth per day for 3 days,2 tabs per day for 3 days,1 tab per day for 3 days  18 tablet  0    No Known Allergies    ROS:  13 systems were reviewed and are notable for diffuse muscle wasting.  All other review of systems are unremarkable.   Examination:  Filed Vitals:   04/11/11 1318  BP: 136/78  Pulse: 56  Weight: 147 lb (66.679 kg)     In general, unkempt asian man in NAD, sitting on his walker.   Cranial Nerves: Pupils are equally round and reactive to light. Extraocular movements are full. Mild lower facial weakness.  Normal palatal elevation.  ++ Tongue fasciculations.  Facial sensation and muscles of mastication are intact. [name] Hearing intact to bilateral finger rub.  Shoulder shrug 4/5.  Neck flexion 4/5, neck extension 4+/5.  Motor:  + thenar wasting, diffuse muscle weakness 4/5.  + fascics.  Reflexes:  Absent reflexes.  Toes  down  Coordination:  Normal finger to nose.    Sensation is impaired distally to temperature - although difficult to assess due to language problem.  Gait not tested due to weakness.   Impression/Recs: Unfortunately, I am limited by a lack of history and lack of ancillary data.  The patient clearly has  diffuse motor neuron signs likely having a motor neuron disease, but given his lack of upper motor neuron signs he does not seem to have ALS.  I am going to see the patient back with his nurse Thomas Raymond, who speaks [name].  We are going to get the records from [facility].  I will likely need to get another EMG/NCS.  It is possible the patient has spinal muscular atrophy or even an autoimmune motor neuronopathy.  I will see the patient back with his [name] translator on April 23, 2010.     Thank you for having [place] see Thomas Raymond in consultation.  Feel free to contact me with any questions.  Thomas Raider Modesto Charon, MD [facility], [place] [address] [place], [place] [address] Phone: [phone] Fax: [phone].

## 2011-04-11 NOTE — Patient Instructions (Signed)
Thomas Raymond is scheduled to meet with Dr. Modesto Charon on Wednesday, April 24, 2011 at 4:30 pm. [phone].

## 2011-04-18 ENCOUNTER — Other Ambulatory Visit: Payer: Self-pay | Admitting: Internal Medicine

## 2011-04-18 DIAGNOSIS — R32 Unspecified urinary incontinence: Secondary | ICD-10-CM

## 2011-04-18 DIAGNOSIS — R159 Full incontinence of feces: Secondary | ICD-10-CM

## 2011-04-24 ENCOUNTER — Ambulatory Visit (INDEPENDENT_AMBULATORY_CARE_PROVIDER_SITE_OTHER): Payer: Medicare Other | Admitting: Internal Medicine

## 2011-04-24 ENCOUNTER — Ambulatory Visit: Payer: Medicare Other | Admitting: Neurology

## 2011-04-24 ENCOUNTER — Encounter: Payer: Self-pay | Admitting: Internal Medicine

## 2011-04-24 VITALS — BP 122/80 | HR 86 | Temp 98.7°F

## 2011-04-24 DIAGNOSIS — I10 Essential (primary) hypertension: Secondary | ICD-10-CM

## 2011-04-24 DIAGNOSIS — J069 Acute upper respiratory infection, unspecified: Secondary | ICD-10-CM | POA: Diagnosis not present

## 2011-04-24 DIAGNOSIS — J988 Other specified respiratory disorders: Secondary | ICD-10-CM | POA: Insufficient documentation

## 2011-04-24 MED ORDER — AZITHROMYCIN 250 MG PO TABS: ORAL_TABLET | ORAL | Status: AC

## 2011-04-24 MED ORDER — HYDROCODONE-HOMATROPINE 5-1.5 MG/5ML PO SYRP: 5.0000 mL | ORAL_SOLUTION | Freq: Four times a day (QID) | ORAL | Status: AC | PRN

## 2011-04-24 MED ORDER — ATENOLOL 50 MG PO TABS: 50.0000 mg | ORAL_TABLET | Freq: Every day | ORAL | Status: DC

## 2011-04-24 MED ORDER — AMLODIPINE BESY-BENAZEPRIL HCL 10-20 MG PO CAPS: 1.0000 | ORAL_CAPSULE | Freq: Every day | ORAL | Status: DC

## 2011-04-24 NOTE — Patient Instructions (Addendum)
Take all new medications as prescribed Continue all other medications as before Please keep your appointments with your specialists as you have planned  - Dr Modesto Charon Please return in 6 months, or sooner if needed

## 2011-04-28 ENCOUNTER — Encounter: Payer: Self-pay | Admitting: Internal Medicine

## 2011-04-28 NOTE — Progress Notes (Signed)
  Subjective:    Patient ID: Thomas Raymond, male    DOB: June 05, 1945, 66 y.o.   MRN: [id number]  HPI   Here with 3 days acute onset fever, general weakness and malaise, and greenish d/c, with mod ST, but little to no cough and Pt denies chest pain, increased sob or doe, wheezing, orthopnea, PND, increased LE swelling, palpitations, dizziness or syncope.  Pt denies new neurological symptoms such as new headache, or facial or extremity weakness or numbness   Pt denies polydipsia, polyuria.  Past Medical History  Diagnosis Date  . ALLERGIC RHINITIS   . ALS   . HYPERLIPIDEMIA   . HYPERTENSION   . VERTIGO   . LOW BACK PAIN   . Impaired glucose tolerance 02/17/2011   Past Surgical History  Procedure Date  . Lipoma removal 2003  . Tracheostomy   . Percutaneous gastrostomy tube repalcement     04/1999 and removed 07/1999    reports that he has never smoked. He has never used smokeless tobacco. He reports that he does not drink alcohol. His drug history not on file. family history is not on file. No Known Allergies Current Outpatient Prescriptions on File Prior to Visit  Medication Sig Dispense Refill  . amLODipine-benazepril (LOTREL) 10-20 MG per capsule Take 1 capsule by mouth daily.  90 capsule  3  . aspirin 81 MG tablet Take 81 mg by mouth daily.        . cetirizine (ZYRTEC) 10 MG tablet Take 1 tablet (10 mg total) by mouth daily.  90 tablet  3  . meclizine (ANTIVERT) 25 MG tablet Take 1 tablet (25 mg total) by mouth 3 (three) times daily as needed for dizziness or nausea.  60 tablet  1  . methocarbamol (ROBAXIN) 500 MG tablet Take 500 mg by mouth. 1 po bid prn       . omeprazole (PRILOSEC) 20 MG capsule Take 1 capsule (20 mg total) by mouth daily.  90 capsule  3  . riluzole (RILUTEK) 50 MG tablet Take 1 tablet (50 mg total) by mouth every 6 (six) hours as needed.  180 tablet  3  . rosuvastatin (CRESTOR) 20 MG tablet Take 1 tablet (20 mg total) by mouth daily.  90 tablet  3  . traMADol (ULTRAM)  50 MG tablet Take 1 tablet (50 mg total) by mouth every 6 (six) hours as needed.  120 tablet  5  . vitamin E 400 UNIT capsule Take 400 Units by mouth daily.         Review of Systems All otherwise neg per pt    Objective:   Physical Exam BP 122/80  Pulse 86  Temp(Src) 98.7 F (37.1 C) (Oral)  SpO2 96% Physical Exam  VS noted Constitutional: Pt appears well-developed and well-nourished.  HENT: Head: Normocephalic.  Right Ear: External ear normal.  Left Ear: External ear normal.  Bilat tm's mild erythema.  Sinus nontender.  Pharynx mild erythema Eyes: Conjunctivae and EOM are normal. Pupils are equal, round, and reactive to light.  Neck: Normal range of motion. Neck supple.  Cardiovascular: Normal rate and regular rhythm.   Pulmonary/Chest: Effort normal and breath sounds normal.     Assessment & Plan:

## 2011-04-28 NOTE — Assessment & Plan Note (Signed)
Mild to mod, for antibx course,  to f/u any worsening symptoms or concerns 

## 2011-04-28 NOTE — Assessment & Plan Note (Signed)
stable overall by hx and exam, most recent data reviewed with pt, and pt to continue medical treatment as before  BP Readings from Last 3 Encounters:  04/24/11 122/80  04/11/11 136/78  03/15/11 118/70

## 2011-06-14 ENCOUNTER — Other Ambulatory Visit: Payer: Self-pay | Admitting: Internal Medicine

## 2011-07-15 DIAGNOSIS — R159 Full incontinence of feces: Secondary | ICD-10-CM

## 2011-09-14 DIAGNOSIS — R159 Full incontinence of feces: Secondary | ICD-10-CM

## 2011-10-22 DIAGNOSIS — R159 Full incontinence of feces: Secondary | ICD-10-CM | POA: Diagnosis not present

## 2011-12-13 DIAGNOSIS — R159 Full incontinence of feces: Secondary | ICD-10-CM | POA: Diagnosis not present

## 2012-02-07 ENCOUNTER — Other Ambulatory Visit (INDEPENDENT_AMBULATORY_CARE_PROVIDER_SITE_OTHER): Payer: Medicare Other

## 2012-02-07 ENCOUNTER — Encounter: Payer: Self-pay | Admitting: Internal Medicine

## 2012-02-07 ENCOUNTER — Ambulatory Visit (INDEPENDENT_AMBULATORY_CARE_PROVIDER_SITE_OTHER): Payer: Medicare Other | Admitting: Internal Medicine

## 2012-02-07 VITALS — BP 134/80 | HR 60 | Temp 97.4°F | Resp 16 | Wt 146.0 lb

## 2012-02-07 DIAGNOSIS — I10 Essential (primary) hypertension: Secondary | ICD-10-CM

## 2012-02-07 DIAGNOSIS — R7302 Impaired glucose tolerance (oral): Secondary | ICD-10-CM

## 2012-02-07 DIAGNOSIS — E785 Hyperlipidemia, unspecified: Secondary | ICD-10-CM

## 2012-02-07 DIAGNOSIS — G1221 Amyotrophic lateral sclerosis: Secondary | ICD-10-CM

## 2012-02-07 DIAGNOSIS — Z0000 Encounter for general adult medical examination without abnormal findings: Secondary | ICD-10-CM | POA: Diagnosis not present

## 2012-02-07 DIAGNOSIS — R7309 Other abnormal glucose: Secondary | ICD-10-CM

## 2012-02-07 DIAGNOSIS — Z23 Encounter for immunization: Secondary | ICD-10-CM | POA: Insufficient documentation

## 2012-02-07 LAB — BASIC METABOLIC PANEL
BUN: 16 mg/dL (ref 6–23)
CO2: 29 mEq/L (ref 19–32)
Chloride: 103 mEq/L (ref 96–112)
Creatinine, Ser: 0.4 mg/dL (ref 0.4–1.5)

## 2012-02-07 LAB — CBC WITH DIFFERENTIAL/PLATELET
Basophils Absolute: 0.1 10*3/uL (ref 0.0–0.1)
Basophils Relative: 0.6 % (ref 0.0–3.0)
Eosinophils Absolute: 0.3 10*3/uL (ref 0.0–0.7)
Lymphocytes Relative: 24.2 % (ref 12.0–46.0)
MCHC: 32.3 g/dL (ref 30.0–36.0)
Neutrophils Relative %: 64.4 % (ref 43.0–77.0)
Platelets: 304 10*3/uL (ref 150.0–400.0)
RBC: 4.44 Mil/uL (ref 4.22–5.81)

## 2012-02-07 LAB — LIPID PANEL: Total CHOL/HDL Ratio: 2

## 2012-02-07 LAB — HEMOGLOBIN A1C: Hgb A1c MFr Bld: 5.2 % (ref 4.6–6.5)

## 2012-02-07 LAB — URINALYSIS, ROUTINE W REFLEX MICROSCOPIC
Leukocytes, UA: NEGATIVE
Nitrite: NEGATIVE
Specific Gravity, Urine: 1.025 (ref 1.000–1.030)
pH: 6 (ref 5.0–8.0)

## 2012-02-07 LAB — HEPATIC FUNCTION PANEL
ALT: 32 U/L (ref 0–53)
Bilirubin, Direct: 0.1 mg/dL (ref 0.0–0.3)
Total Protein: 8.1 g/dL (ref 6.0–8.3)

## 2012-02-07 LAB — TSH: TSH: 0.98 u[IU]/mL (ref 0.35–5.50)

## 2012-02-07 NOTE — Patient Instructions (Addendum)
You had the flu shot today, and pneumonia shot You will be contacted regarding the referral for: Gastroenterology, to consider need for screening colonoscopy You will be contacted regarding the referral for: neurology - Dr Storm Frisk Please go to LAB in the Basement for the blood and/or urine tests to be done today You will be contacted by phone if any changes need to be made immediately.  Otherwise, you will receive a letter about your results with an explanation. Please remember to sign up for My Chart at your earliest convenience, as this will be important to you in the future with finding out test results. Please return in 6 months, or sooner if needed

## 2012-02-08 ENCOUNTER — Encounter: Payer: Self-pay | Admitting: Internal Medicine

## 2012-02-08 NOTE — Assessment & Plan Note (Signed)
Needs ongoing neuro f/u as Dr Modesto Charon has left the practice, for referal to [place] neurology

## 2012-02-08 NOTE — Progress Notes (Signed)
Subjective:    Patient ID: Thomas Raymond, male    DOB: [birth year], 66 y.o.   MRN: [id number]  HPI  Here to f/u; overall doing ok,  Pt denies chest pain, increased sob or doe, wheezing, orthopnea, PND, increased LE swelling, palpitations, dizziness or syncope.  Pt denies new neurological symptoms such as new headache, or facial or extremity weakness or numbness, states overall neuromuscular condition no real change in the past yr.   Pt denies polydipsia, polyuria, or low sugar symptoms such as weakness or confusion improved with po intake.  Pt states overall good compliance with meds, trying to follow lower cholesterol diet, wt overall stable but little exercise however.  Has not seen neurology since dec 2012, and not currently established, no planned f/u.  For flu shot today.   Past Medical History  Diagnosis Date  . ALLERGIC RHINITIS   . ALS   . HYPERLIPIDEMIA   . HYPERTENSION   . VERTIGO   . LOW BACK PAIN   . Impaired glucose tolerance 02/17/2011   Past Surgical History  Procedure Date  . Lipoma removal 2003  . Tracheostomy   . Percutaneous gastrostomy tube repalcement     04/1999 and removed 07/1999    reports that he has never smoked. He has never used smokeless tobacco. He reports that he does not drink alcohol. His drug history not on file. family history is not on file. No Known Allergies Current Outpatient Prescriptions on File Prior to Visit  Medication Sig Dispense Refill  . amLODipine-benazepril (LOTREL) 10-20 MG per capsule Take 1 capsule by mouth daily.  90 capsule  3  . amLODipine-benazepril (LOTREL) 10-20 MG per capsule TAKE ONE CAPSULE BY MOUTH EVERY DAY  30 capsule  7  . aspirin 81 MG tablet Take 81 mg by mouth daily.        [name] atenolol (TENORMIN) 50 MG tablet Take 1 tablet (50 mg total) by mouth at bedtime.  90 tablet  3  . cetirizine (ZYRTEC) 10 MG tablet Take 1 tablet (10 mg total) by mouth daily.  90 tablet  3  . meclizine (ANTIVERT) 25 MG tablet Take 1 tablet (25 mg  total) by mouth 3 (three) times daily as needed for dizziness or nausea.  60 tablet  1  . methocarbamol (ROBAXIN) 500 MG tablet Take 500 mg by mouth. 1 po bid prn       . omeprazole (PRILOSEC) 20 MG capsule Take 1 capsule (20 mg total) by mouth daily.  90 capsule  3  . riluzole (RILUTEK) 50 MG tablet Take 1 tablet (50 mg total) by mouth every 6 (six) hours as needed.  180 tablet  3  . rosuvastatin (CRESTOR) 20 MG tablet Take 1 tablet (20 mg total) by mouth daily.  90 tablet  3  . traMADol (ULTRAM) 50 MG tablet Take 1 tablet (50 mg total) by mouth every 6 (six) hours as needed.  120 tablet  5  . vitamin E 400 UNIT capsule Take 400 Units by mouth daily.          Review of Systems  Constitutional: Negative for diaphoresis and unexpected weight change.  HENT: Negative for tinnitus.   Eyes: Negative for photophobia and visual disturbance.  Respiratory: Negative for choking and stridor.   Gastrointestinal: Negative for vomiting and blood in stool.  Genitourinary: Negative for hematuria and decreased urine volume.  Musculoskeletal: Negative for acute joint sweling Skin: Negative for color change and wound.  Neurological: Negative for numbness, no recent  falls  Psychiatric/Behavioral: Negative for decreased concentration. The patient is not hyperactive.       Objective:   Physical Exam BP 134/80  Pulse 60  Temp 97.4 F (36.3 C) (Oral)  Resp 16  Wt 146 lb (66.225 kg)  SpO2 97% Physical Exam  VS noted, not able to ascend exam table due to weakness Constitutional: Pt appears well-developed and well-nourished.  HENT: Head: Normocephalic.  Right Ear: External ear normal.  Left Ear: External ear normal.  Eyes: Conjunctivae and EOM are normal. Pupils are equal, round, and reactive to light.  Neck: Normal range of motion. Neck supple.  Cardiovascular: Normal rate and regular rhythm.   Pulmonary/Chest: Effort normal and breath sounds normal.  Abd:  Soft, NT, non-distended, + BS Neurological:  Pt is alert. Not confused , o/w not done in detail, can ambulate behind his wlaker Skin: Skin is warm. No erythema. No LE edema Psychiatric: Pt behavior is normal. Thought content normal.     Assessment & Plan:

## 2012-02-08 NOTE — Assessment & Plan Note (Signed)
stable overall by hx and exam, most recent data reviewed with pt, and pt to continue medical treatment as before Lab Results  Component Value Date   LDLCALC 57 02/07/2012

## 2012-02-08 NOTE — Assessment & Plan Note (Signed)
stable overall by hx and exam, most recent data reviewed with pt, and pt to continue medical treatment as before BP Readings from Last 3 Encounters:  02/07/12 134/80  04/24/11 122/80  04/11/11 136/78

## 2012-02-08 NOTE — Assessment & Plan Note (Signed)
stable overall by hx and exam, most recent data reviewed with pt, and pt to continue medical treatment as before Lab Results  Component Value Date   HGBA1C 5.2 02/07/2012

## 2012-02-08 NOTE — Assessment & Plan Note (Signed)
Not charged, but due for immunizations and colonoscopy

## 2012-02-18 DIAGNOSIS — R159 Full incontinence of feces: Secondary | ICD-10-CM

## 2012-02-24 DIAGNOSIS — G1221 Amyotrophic lateral sclerosis: Secondary | ICD-10-CM | POA: Diagnosis not present

## 2012-02-24 DIAGNOSIS — M545 Low back pain: Secondary | ICD-10-CM | POA: Diagnosis not present

## 2012-03-02 DIAGNOSIS — G1221 Amyotrophic lateral sclerosis: Secondary | ICD-10-CM | POA: Diagnosis not present

## 2012-03-02 DIAGNOSIS — M545 Low back pain: Secondary | ICD-10-CM | POA: Diagnosis not present

## 2012-03-20 ENCOUNTER — Other Ambulatory Visit: Payer: Self-pay | Admitting: Internal Medicine

## 2012-04-12 ENCOUNTER — Other Ambulatory Visit: Payer: Self-pay | Admitting: Internal Medicine

## 2012-04-13 NOTE — Telephone Encounter (Signed)
Robin to let pt know, I normally dont prescribed the Rilutek (that he requested a refill)  I can prescribe a short prescription to "tide him over" until he can see a specialist, or he can call the specialist now for a refill

## 2012-04-13 NOTE — Telephone Encounter (Signed)
Patient informed. 

## 2012-04-14 DIAGNOSIS — R159 Full incontinence of feces: Secondary | ICD-10-CM

## 2012-04-21 ENCOUNTER — Other Ambulatory Visit: Payer: Self-pay | Admitting: Internal Medicine

## 2012-05-05 ENCOUNTER — Other Ambulatory Visit: Payer: Self-pay | Admitting: Internal Medicine

## 2012-05-21 ENCOUNTER — Other Ambulatory Visit: Payer: Self-pay | Admitting: Internal Medicine

## 2012-06-08 ENCOUNTER — Other Ambulatory Visit: Payer: Self-pay | Admitting: Internal Medicine

## 2012-06-28 ENCOUNTER — Ambulatory Visit (INDEPENDENT_AMBULATORY_CARE_PROVIDER_SITE_OTHER): Payer: Medicare Other | Admitting: Family Medicine

## 2012-06-28 VITALS — BP 160/78 | HR 89 | Temp 98.0°F | Resp 18 | Wt 140.0 lb

## 2012-06-28 DIAGNOSIS — J209 Acute bronchitis, unspecified: Secondary | ICD-10-CM | POA: Diagnosis not present

## 2012-06-28 DIAGNOSIS — M542 Cervicalgia: Secondary | ICD-10-CM

## 2012-06-28 DIAGNOSIS — J019 Acute sinusitis, unspecified: Secondary | ICD-10-CM

## 2012-06-28 MED ORDER — AZITHROMYCIN 250 MG PO TABS: ORAL_TABLET | ORAL | Status: DC

## 2012-06-28 MED ORDER — HYDROCODONE-HOMATROPINE 5-1.5 MG/5ML PO SYRP: 5.0000 mL | ORAL_SOLUTION | Freq: Three times a day (TID) | ORAL | Status: DC | PRN

## 2012-06-28 NOTE — Patient Instructions (Signed)
Sinusitis Sinusitis is redness, soreness, and swelling (inflammation) of the paranasal sinuses. Paranasal sinuses are air pockets within the bones of your face (beneath the eyes, the middle of the forehead, or above the eyes). In healthy paranasal sinuses, mucus is able to drain out, and air is able to circulate through them by way of your nose. However, when your paranasal sinuses are inflamed, mucus and air can become trapped. This can allow bacteria and other germs to grow and cause infection. Sinusitis can develop quickly and last only a short time (acute) or continue over a long period (chronic). Sinusitis that lasts for more than 12 weeks is considered chronic.  CAUSES  Causes of sinusitis include:  Allergies.  Structural abnormalities, such as displacement of the cartilage that separates your nostrils (deviated septum), which can decrease the air flow through your nose and sinuses and affect sinus drainage.  Functional abnormalities, such as when the small hairs (cilia) that line your sinuses and help remove mucus do not work properly or are not present. SYMPTOMS  Symptoms of acute and chronic sinusitis are the same. The primary symptoms are pain and pressure around the affected sinuses. Other symptoms include:  Upper toothache.  Earache.  Headache.  Bad breath.  Decreased sense of smell and taste.  A cough, which worsens when you are lying flat.  Fatigue.  Fever.  Thick drainage from your nose, which often is green and may contain pus (purulent).  Swelling and warmth over the affected sinuses. DIAGNOSIS  Your caregiver will perform a physical exam. During the exam, your caregiver may:  Look in your nose for signs of abnormal growths in your nostrils (nasal polyps).  Tap over the affected sinus to check for signs of infection.  View the inside of your sinuses (endoscopy) with a special imaging device with a light attached (endoscope), which is inserted into your  sinuses. If your caregiver suspects that you have chronic sinusitis, one or more of the following tests may be recommended:  Allergy tests.  Nasal culture A sample of mucus is taken from your nose and sent to a lab and screened for bacteria.  Nasal cytology A sample of mucus is taken from your nose and examined by your caregiver to determine if your sinusitis is related to an allergy. TREATMENT  Most cases of acute sinusitis are related to a viral infection and will resolve on their own within 10 days. Sometimes medicines are prescribed to help relieve symptoms (pain medicine, decongestants, nasal steroid sprays, or saline sprays).  However, for sinusitis related to a bacterial infection, your caregiver will prescribe antibiotic medicines. These are medicines that will help kill the bacteria causing the infection.  Rarely, sinusitis is caused by a fungal infection. In theses cases, your caregiver will prescribe antifungal medicine. For some cases of chronic sinusitis, surgery is needed. Generally, these are cases in which sinusitis recurs more than 3 times per year, despite other treatments. HOME CARE INSTRUCTIONS   Drink plenty of water. Water helps thin the mucus so your sinuses can drain more easily.  Use a humidifier.  Inhale steam 3 to 4 times a day (for example, sit in the bathroom with the shower running).  Apply a warm, moist washcloth to your face 3 to 4 times a day, or as directed by your caregiver.  Use saline nasal sprays to help moisten and clean your sinuses.  Take over-the-counter or prescription medicines for pain, discomfort, or fever only as directed by your caregiver. SEEK IMMEDIATE MEDICAL   CARE IF:  You have increasing pain or severe headaches.  You have nausea, vomiting, or drowsiness.  You have swelling around your face.  You have vision problems.  You have a stiff neck.  You have difficulty breathing. MAKE SURE YOU:   Understand these  instructions.  Will watch your condition.  Will get help right away if you are not doing well or get worse. Document Released: 04/08/2005 Document Revised: 07/01/2011 Document Reviewed: 04/23/2011 Exit[employer] Patient Information 2013 [employer]. Acute Bronchitis You have acute bronchitis. This means you have a chest cold. The airways in your lungs are red and sore (inflamed). Acute means it is sudden onset.  CAUSES Bronchitis is most often caused by the same virus that causes a cold. SYMPTOMS   Body aches.  Chest congestion.  Chills.  Cough.  Fever.  Shortness of breath.  Sore throat. TREATMENT  Acute bronchitis is usually treated with rest, fluids, and medicines for relief of fever or cough. Most symptoms should go away after a few days or a week. Increased fluids may help thin your secretions and will prevent dehydration. Your caregiver may give you an inhaler to improve your symptoms. The inhaler reduces shortness of breath and helps control cough. You can take over-the-counter pain relievers or cough medicine to decrease coughing, pain, or fever. A cool-air vaporizer may help thin bronchial secretions and make it easier to clear your chest. Antibiotics are usually not needed but can be prescribed if you smoke, are seriously ill, have chronic lung problems, are elderly, or you are at higher risk for developing complications.Allergies and asthma can make bronchitis worse. Repeated episodes of bronchitis may cause longstanding lung problems. Avoid smoking and secondhand smoke.Exposure to cigarette smoke or irritating chemicals will make bronchitis worse. If you are a cigarette smoker, consider using nicotine gum or skin patches to help control withdrawal symptoms. Quitting smoking will help your lungs heal faster. Recovery from bronchitis is often slow, but you should start feeling better after 2 to 3 days. Cough from bronchitis frequently lasts for 3 to 4 weeks. To prevent  another bout of acute bronchitis:  Quit smoking.  Wash your hands frequently to get rid of viruses or use a hand sanitizer.  Avoid other people with cold or virus symptoms.  Try not to touch your hands to your mouth, nose, or eyes. SEEK IMMEDIATE MEDICAL CARE IF:  You develop increased fever, chills, or chest pain.  You have severe shortness of breath or bloody sputum.  You develop dehydration, fainting, repeated vomiting, or a severe headache.  You have no improvement after 1 week of treatment or you get worse. MAKE SURE YOU:   Understand these instructions.  Will watch your condition.  Will get help right away if you are not doing well or get worse. Document Released: 05/16/2004 Document Revised: 07/01/2011 Document Reviewed: 08/01/2010 Exit[employer] Patient Information 2013 [employer].  
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
## 2012-07-31 DIAGNOSIS — R159 Full incontinence of feces: Secondary | ICD-10-CM | POA: Diagnosis not present

## 2012-10-05 ENCOUNTER — Emergency Department (HOSPITAL_COMMUNITY)
Admission: EM | Admit: 2012-10-05 | Discharge: 2012-10-05 | Disposition: A | Discharge status: Home / Self Care | Payer: Medicare Other | Attending: Emergency Medicine | Admitting: Emergency Medicine

## 2012-10-05 ENCOUNTER — Emergency Department (HOSPITAL_COMMUNITY): Payer: Medicare Other

## 2012-10-05 ENCOUNTER — Encounter (HOSPITAL_COMMUNITY): Payer: Self-pay

## 2012-10-05 DIAGNOSIS — M542 Cervicalgia: Secondary | ICD-10-CM | POA: Diagnosis not present

## 2012-10-05 DIAGNOSIS — Z79899 Other long term (current) drug therapy: Secondary | ICD-10-CM | POA: Insufficient documentation

## 2012-10-05 DIAGNOSIS — I10 Essential (primary) hypertension: Secondary | ICD-10-CM | POA: Insufficient documentation

## 2012-10-05 DIAGNOSIS — S46909A Unspecified injury of unspecified muscle, fascia and tendon at shoulder and upper arm level, unspecified arm, initial encounter: Secondary | ICD-10-CM | POA: Insufficient documentation

## 2012-10-05 DIAGNOSIS — Z8669 Personal history of other diseases of the nervous system and sense organs: Secondary | ICD-10-CM | POA: Diagnosis not present

## 2012-10-05 DIAGNOSIS — Z930 Tracheostomy status: Secondary | ICD-10-CM | POA: Insufficient documentation

## 2012-10-05 DIAGNOSIS — Z7982 Long term (current) use of aspirin: Secondary | ICD-10-CM | POA: Diagnosis not present

## 2012-10-05 DIAGNOSIS — Z8739 Personal history of other diseases of the musculoskeletal system and connective tissue: Secondary | ICD-10-CM | POA: Diagnosis not present

## 2012-10-05 DIAGNOSIS — S0993XA Unspecified injury of face, initial encounter: Secondary | ICD-10-CM | POA: Diagnosis not present

## 2012-10-05 DIAGNOSIS — G1221 Amyotrophic lateral sclerosis: Secondary | ICD-10-CM | POA: Diagnosis not present

## 2012-10-05 DIAGNOSIS — Z23 Encounter for immunization: Secondary | ICD-10-CM | POA: Diagnosis not present

## 2012-10-05 DIAGNOSIS — Z8639 Personal history of other endocrine, nutritional and metabolic disease: Secondary | ICD-10-CM | POA: Insufficient documentation

## 2012-10-05 DIAGNOSIS — Z862 Personal history of diseases of the blood and blood-forming organs and certain disorders involving the immune mechanism: Secondary | ICD-10-CM | POA: Insufficient documentation

## 2012-10-05 DIAGNOSIS — IMO0002 Reserved for concepts with insufficient information to code with codable children: Secondary | ICD-10-CM

## 2012-10-05 DIAGNOSIS — S8990XA Unspecified injury of unspecified lower leg, initial encounter: Secondary | ICD-10-CM | POA: Insufficient documentation

## 2012-10-05 DIAGNOSIS — R259 Unspecified abnormal involuntary movements: Secondary | ICD-10-CM | POA: Diagnosis not present

## 2012-10-05 DIAGNOSIS — S4980XA Other specified injuries of shoulder and upper arm, unspecified arm, initial encounter: Secondary | ICD-10-CM | POA: Diagnosis not present

## 2012-10-05 DIAGNOSIS — S99929A Unspecified injury of unspecified foot, initial encounter: Secondary | ICD-10-CM | POA: Insufficient documentation

## 2012-10-05 DIAGNOSIS — S79919A Unspecified injury of unspecified hip, initial encounter: Secondary | ICD-10-CM | POA: Diagnosis not present

## 2012-10-05 DIAGNOSIS — S79929A Unspecified injury of unspecified thigh, initial encounter: Secondary | ICD-10-CM | POA: Insufficient documentation

## 2012-10-05 DIAGNOSIS — M545 Low back pain: Secondary | ICD-10-CM | POA: Diagnosis not present

## 2012-10-05 DIAGNOSIS — E785 Hyperlipidemia, unspecified: Secondary | ICD-10-CM | POA: Insufficient documentation

## 2012-10-05 MED ORDER — TRAMADOL HCL 50 MG PO TABS: 50.0000 mg | ORAL_TABLET | Freq: Four times a day (QID) | ORAL | Status: DC | PRN

## 2012-10-05 MED ORDER — TRAMADOL HCL 50 MG PO TABS
50.0000 mg | ORAL_TABLET | Freq: Once | ORAL | Status: AC
  Administered 2012-10-05: 50 mg via ORAL
  Filled 2012-10-05: qty 1

## 2012-10-05 MED ORDER — ATENOLOL 50 MG PO TABS: ORAL_TABLET | ORAL | Status: DC

## 2012-10-05 MED ORDER — TETANUS-DIPHTH-ACELL PERTUSSIS 5-2.5-18.5 LF-MCG/0.5 IM SUSP
0.5000 mL | Freq: Once | INTRAMUSCULAR | Status: AC
  Administered 2012-10-05: 0.5 mL via INTRAMUSCULAR
  Filled 2012-10-05: qty 0.5

## 2012-10-05 NOTE — ED Provider Notes (Signed)
Medical screening examination/treatment/procedure(s) were performed by non-physician practitioner and as supervising physician I was immediately available for consultation/collaboration.    Glora Hulgan R Laura Radilla, MD 10/05/12 1704 
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
## 2012-10-05 NOTE — ED Notes (Signed)
Patient transported to X-ray 
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
## 2012-10-12 ENCOUNTER — Ambulatory Visit (INDEPENDENT_AMBULATORY_CARE_PROVIDER_SITE_OTHER): Payer: Medicare Other | Admitting: Emergency Medicine

## 2012-10-12 VITALS — BP 122/80 | HR 50 | Temp 97.5°F | Resp 14 | Wt 147.0 lb

## 2012-10-12 DIAGNOSIS — R05 Cough: Secondary | ICD-10-CM

## 2012-10-12 DIAGNOSIS — K120 Recurrent oral aphthae: Secondary | ICD-10-CM

## 2012-10-12 MED ORDER — MAGIC MOUTHWASH W/LIDOCAINE: 10.0000 mL | ORAL | Status: DC | PRN

## 2012-10-12 NOTE — Patient Instructions (Addendum)
Canker Sores   Canker sores are painful, open sores on the inside of the mouth and cheek. They may be white or yellow. The sores usually heal in 1 to 2 weeks. Women are more likely than men to have recurrent canker sores.  CAUSES  The cause of canker sores is not well understood. More than one cause is likely. Canker sores do not appear to be caused by certain types of germs (viruses or bacteria). Canker sores may be caused by:   An allergic reaction to certain foods.   Digestive problems.   Not having enough vitamin B12, folic acid, and iron.   Male sex hormones. Sores may come only during certain phases of a menstrual cycle. Often, there is improvement during pregnancy.   Genetics. Some people seem to inherit canker sore problems.  Emotional stress and injuries to the mouth may trigger outbreaks, but not cause them.   DIAGNOSIS  Canker sores are diagnosed by exam.   TREATMENT   Patients who have frequent bouts of canker sores may have cultures taken of the sores, blood tests, or allergy tests. This helps determine if their sores are caused by a poor diet, an allergy, or some other preventable or treatable disease.   Vitamins may prevent recurrences or reduce the severity of canker sores in people with poor nutrition.   Numbing ointments can relieve pain. These are available in drug stores without a prescription.   Anti-inflammatory steroid mouth rinses or gels may be prescribed by your caregiver for severe sores.   Oral steroids may be prescribed if you have severe, recurrent canker sores. These strong medicines can cause many side effects and should be used only under the close direction of a dentist or physician.   Mouth rinses containing the antibiotic medicine may be prescribed. They may lessen symptoms and speed healing.  Healing usually happens in about 1 or 2 weeks with or without treatment. Certain antibiotic mouth rinses given to pregnant women and young children can permanently stain teeth.  Talk to your caregiver about your treatment.  HOME CARE INSTRUCTIONS    Avoid foods that cause canker sores for you.   Avoid citrus juices, spicy or salty foods, and coffee until the sores are healed.   Use a soft-bristled toothbrush.   Chew your food carefully to avoid biting your cheek.   Apply topical numbing medicine to the sore to help relieve pain.   Apply a thin paste of baking soda and water to the sore to help heal the sore.   Only use mouth rinses or medicines for pain or discomfort as directed by your caregiver.  SEEK MEDICAL CARE IF:    Your symptoms are not better in 1 week.   Your sores are still present after 2 weeks.   Your sores are very painful.   You have trouble breathing or swallowing.   Your sores come back frequently.  Document Released: 08/03/2010 Document Revised: 07/01/2011 Document Reviewed: 08/03/2010  [employer] Patient Information 2014 [employer].
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
## 2012-12-05 ENCOUNTER — Other Ambulatory Visit: Payer: Self-pay | Admitting: Internal Medicine

## 2013-01-03 ENCOUNTER — Other Ambulatory Visit: Payer: Self-pay | Admitting: Neurology

## 2013-02-23 ENCOUNTER — Ambulatory Visit (INDEPENDENT_AMBULATORY_CARE_PROVIDER_SITE_OTHER): Payer: Medicare Other | Admitting: Internal Medicine

## 2013-02-23 ENCOUNTER — Other Ambulatory Visit (INDEPENDENT_AMBULATORY_CARE_PROVIDER_SITE_OTHER): Payer: Medicare Other

## 2013-02-23 ENCOUNTER — Encounter: Payer: Self-pay | Admitting: Internal Medicine

## 2013-02-23 VITALS — BP 152/90 | HR 87 | Temp 97.1°F | Wt 152.0 lb

## 2013-02-23 DIAGNOSIS — Z23 Encounter for immunization: Secondary | ICD-10-CM

## 2013-02-23 DIAGNOSIS — N320 Bladder-neck obstruction: Secondary | ICD-10-CM

## 2013-02-23 DIAGNOSIS — R7302 Impaired glucose tolerance (oral): Secondary | ICD-10-CM

## 2013-02-23 DIAGNOSIS — E785 Hyperlipidemia, unspecified: Secondary | ICD-10-CM

## 2013-02-23 DIAGNOSIS — R7309 Other abnormal glucose: Secondary | ICD-10-CM

## 2013-02-23 DIAGNOSIS — I10 Essential (primary) hypertension: Secondary | ICD-10-CM | POA: Diagnosis not present

## 2013-02-23 LAB — URINALYSIS, ROUTINE W REFLEX MICROSCOPIC
Ketones, ur: NEGATIVE
Specific Gravity, Urine: >=1.03 (ref 1.000–1.030)
Urine Glucose: NEGATIVE
Urobilinogen, UA: 0.2 (ref 0.0–1.0)

## 2013-02-23 LAB — CBC WITH DIFFERENTIAL/PLATELET
Basophils Absolute: 0.1 10*3/uL (ref 0.0–0.1)
Eosinophils Absolute: 0.4 10*3/uL (ref 0.0–0.7)
HCT: 43.7 % (ref 39.0–52.0)
Hemoglobin: 14.7 g/dL (ref 13.0–17.0)
Lymphs Abs: 2.4 10*3/uL (ref 0.7–4.0)
MCHC: 33.6 g/dL (ref 30.0–36.0)
MCV: 86.5 fl (ref 78.0–100.0)
Monocytes Absolute: 0.6 10*3/uL (ref 0.1–1.0)
Neutro Abs: 4.3 10*3/uL (ref 1.4–7.7)
RDW: 14.5 % (ref 11.5–14.6)

## 2013-02-23 MED ORDER — AMLODIPINE BESY-BENAZEPRIL HCL 10-40 MG PO CAPS: 1.0000 | ORAL_CAPSULE | Freq: Every day | ORAL | Status: DC

## 2013-02-23 NOTE — Progress Notes (Addendum)
Subjective:    Patient ID: Thomas Raymond, male    DOB: [birth year], 67 y.o.   MRN: [id number]  HPI  Here for yearly f/u;  Overall doing ok;  Pt denies CP, worsening SOB, DOE, wheezing, orthopnea, PND, worsening LE edema, palpitations, dizziness or syncope.  Pt denies neurological change such as new headache, facial or extremity weakness.  Pt denies polydipsia, polyuria, or low sugar symptoms. Pt states overall good compliance with treatment and medications, good tolerability, and has been trying to follow lower cholesterol diet.  Pt denies worsening depressive symptoms, suicidal ideation or panic. No fever, night sweats, wt loss, loss of appetite, or other constitutional symptoms.  Pt states good ability with ADL's, has low fall risk, home safety reviewed and adequate, no other significant changes in hearing or vision Past Medical History  Diagnosis Date  . ALLERGIC RHINITIS   . ALS   . HYPERLIPIDEMIA   . HYPERTENSION   . VERTIGO   . LOW BACK PAIN   . Impaired glucose tolerance 02/17/2011   Past Surgical History  Procedure Laterality Date  . Lipoma removal  2003  . Tracheostomy    . Percutaneous gastrostomy tube repalcement      04/1999 and removed 07/1999    reports that he has never smoked. He has never used smokeless tobacco. He reports that he does not drink alcohol. His drug history is not on file. family history is not on file. No Known Allergies Current Outpatient Prescriptions on File Prior to Visit  Medication Sig Dispense Refill  . Alum & Mag Hydroxide-Simeth (MAGIC MOUTHWASH W/LIDOCAINE) SOLN Take 10 mLs by mouth every 2 (two) hours as needed.  360 mL  0  . aspirin 81 MG tablet Take 81 mg by mouth daily.        [name] atenolol (TENORMIN) 50 MG tablet TAKE 1 TABLET BY MOUTH EVERY DAY AT BEDTIME  90 tablet  3  . cetirizine (ZYRTEC) 10 MG tablet TAKE 1 TABLET BY MOUTH EVERY DAY  90 tablet  2  . cholecalciferol (VITAMIN D) 1000 UNITS tablet Take 1,000 Units by mouth daily.      . CRESTOR 20  MG tablet TAKE 1 TABLET BY MOUTH EVERY DAY  90 tablet  3  . HYDROcodone-homatropine (HYCODAN) 5-1.5 MG/5ML syrup Take 5 mLs by mouth every 8 (eight) hours as needed for cough.  120 mL  0  . meloxicam (MOBIC) 7.5 MG tablet TAKE 1 TABLET BY MOUTH TWICE A DAY  60 tablet  2  . methocarbamol (ROBAXIN) 500 MG tablet Take 500 mg by mouth. 1 po bid prn       . omeprazole (PRILOSEC) 20 MG capsule TAKE ONE CAPSULE BY MOUTH EVERY DAY  90 capsule  3  . riluzole (RILUTEK) 50 MG tablet Take 1 tablet (50 mg total) by mouth every 6 (six) hours as needed.  180 tablet  3  . rosuvastatin (CRESTOR) 20 MG tablet Take 1 tablet (20 mg total) by mouth daily.  90 tablet  3  . traMADol (ULTRAM) 50 MG tablet Take 1 tablet (50 mg total) by mouth every 6 (six) hours as needed.  30 tablet  0  . vitamin E 400 UNIT capsule Take 400 Units by mouth daily.        . meclizine (ANTIVERT) 25 MG tablet Take 1 tablet (25 mg total) by mouth 3 (three) times daily as needed for dizziness or nausea.  60 tablet  1   No current facility-administered medications on file prior  to visit.   Review of Systems Constitutional: Negative for diaphoresis, activity change, appetite change or unexpected weight change.  HENT: Negative for hearing loss, ear pain, facial swelling, mouth sores and neck stiffness.   Eyes: Negative for pain, redness and visual disturbance.  Respiratory: Negative for shortness of breath and wheezing.   Cardiovascular: Negative for chest pain and palpitations.  Gastrointestinal: Negative for diarrhea, blood in stool, abdominal distention or other pain Genitourinary: Negative for hematuria, flank pain or change in urine volume.  Musculoskeletal: Negative for myalgias and joint swelling.  Skin: Negative for color change and wound.  Neurological: Negative for syncope and numbness. other than noted Hematological: Negative for adenopathy.  Psychiatric/Behavioral: Negative for hallucinations, self-injury, decreased concentration  and agitation.      Objective:   Physical Exam BP 152/90  Pulse 87  Temp(Src) 97.1 F (36.2 C) (Oral)  Wt 152 lb (68.947 kg)  SpO2 95% VS noted,  Constitutional: Pt appears well-developed and well-nourished.  HENT: Head: NCAT.  Right Ear: External ear normal.  Left Ear: External ear normal.  Eyes: Conjunctivae and EOM are normal. Pupils are equal, round, and reactive to light.  Neck: Normal range of motion. Neck supple.  Cardiovascular: Normal rate and regular rhythm.   Pulmonary/Chest: Effort normal and breath sounds normal.  Abd:  Soft, NT, non-distended, + BS Neurological: Pt is alert. Not confused , o/w not done in detail Skin: Skin is warm. No erythema.  Psychiatric: Pt behavior is normal. Thought content normal.         Assessment & Plan:  Quality Measures addressed:  Colorectal Cancer screening: pt declines all at this time, including FOBT, flex sig, colonoscopy

## 2013-02-23 NOTE — Assessment & Plan Note (Signed)
Also for psa as he is due 

## 2013-02-23 NOTE — Assessment & Plan Note (Signed)
stable overall by history and exam, recent data reviewed with pt, and pt to continue medical treatment as before,  to f/u any worsening symptoms or concerns Lab Results  Component Value Date   LDLCALC 57 02/07/2012   For f/u labs

## 2013-02-23 NOTE — Progress Notes (Signed)
Pre-visit discussion using our clinic review tool. No additional management support is needed unless otherwise documented below in the visit note.  

## 2013-02-23 NOTE — Patient Instructions (Signed)
You had the flu shot today Please return in 2 wks for the new Prevnar pneumonia shot, AND Blood Pressure check OK to stop the lotrel 10/20 mg per day Please take all new medication as prescribed - the generic Lotrel 10/40 mg  (and please let us know if the pharmacy does not have this, as this can be changed to 2 separate pills if needed) Please continue your efforts at being more active, low cholesterol diet, and weight control. You are otherwise up to date with prevention measures today. Please call if you change your mind about having the colonoscopy done Please keep your appointments with your specialists as you have planned - neurology  Please go to the LAB in the Basement (turn left off the elevator) for the tests to be done today  You will be contacted by phone if any changes need to be made immediately.  Otherwise, you will receive a letter about your results with an explanation, but please check with MyChart first.  Please remember to sign up for My Chart if you have not done so, as this will be important to you in the future with finding out test results, communicating by private email, and scheduling acute appointments online when needed.  Please return in 6 months, or sooner if needed

## 2013-02-23 NOTE — Assessment & Plan Note (Signed)
Uncontrolled, to adjust lotreol to 10/40 mg, o/w stable overall by history and exam, recent data reviewed with pt, and pt to continue medical treatment as before,  to f/u any worsening symptoms or concerns BP Readings from Last 3 Encounters:  02/23/13 152/90  10/12/12 122/80  10/05/12 128/94

## 2013-02-23 NOTE — Assessment & Plan Note (Signed)
Asympt, cont same tx,  to f/u any worsening symptoms or concerns

## 2013-02-24 ENCOUNTER — Encounter: Payer: Self-pay | Admitting: Internal Medicine

## 2013-02-24 LAB — LIPID PANEL
HDL: 50.5 mg/dL (ref >39.00)
LDL Cholesterol: 60 mg/dL (ref 0–99)
Total CHOL/HDL Ratio: 3
Triglycerides: 102 mg/dL (ref 0.0–149.0)
VLDL: 20.4 mg/dL (ref 0.0–40.0)

## 2013-02-24 LAB — HEPATIC FUNCTION PANEL
Alkaline Phosphatase: 67 U/L (ref 39–117)
Bilirubin, Direct: 0.1 mg/dL (ref 0.0–0.3)
Total Bilirubin: 0.5 mg/dL (ref 0.3–1.2)

## 2013-02-24 LAB — BASIC METABOLIC PANEL
Calcium: 9.5 mg/dL (ref 8.4–10.5)
GFR: 220.77 mL/min (ref >60.00)
Sodium: 137 mEq/L (ref 135–145)

## 2013-02-24 LAB — HEMOGLOBIN A1C: Hgb A1c MFr Bld: 6.1 % (ref 4.6–6.5)

## 2013-02-24 LAB — TSH: TSH: 0.85 u[IU]/mL (ref 0.35–5.50)

## 2013-03-09 ENCOUNTER — Ambulatory Visit (INDEPENDENT_AMBULATORY_CARE_PROVIDER_SITE_OTHER): Payer: Medicare Other

## 2013-03-09 DIAGNOSIS — Z23 Encounter for immunization: Secondary | ICD-10-CM

## 2013-04-19 ENCOUNTER — Other Ambulatory Visit: Payer: Self-pay | Admitting: Internal Medicine

## 2013-04-19 NOTE — Telephone Encounter (Signed)
Refill done.  

## 2013-04-29 ENCOUNTER — Other Ambulatory Visit: Payer: Self-pay | Admitting: Internal Medicine

## 2013-04-29 ENCOUNTER — Other Ambulatory Visit: Payer: Self-pay | Admitting: Neurology

## 2013-06-01 DIAGNOSIS — R159 Full incontinence of feces: Secondary | ICD-10-CM | POA: Diagnosis not present

## 2013-06-01 DIAGNOSIS — R32 Unspecified urinary incontinence: Secondary | ICD-10-CM

## 2013-06-07 ENCOUNTER — Inpatient Hospital Stay (HOSPITAL_COMMUNITY)
Admission: EM | Admit: 2013-06-07 | Discharge: 2013-06-09 | DRG: 193 | Disposition: A | Discharge status: Home / Self Care | Payer: Medicare Other | Attending: Family Medicine | Admitting: Family Medicine

## 2013-06-07 ENCOUNTER — Emergency Department (HOSPITAL_COMMUNITY): Payer: Medicare Other

## 2013-06-07 ENCOUNTER — Encounter (HOSPITAL_COMMUNITY): Payer: Self-pay | Admitting: Emergency Medicine

## 2013-06-07 DIAGNOSIS — E785 Hyperlipidemia, unspecified: Secondary | ICD-10-CM | POA: Diagnosis present

## 2013-06-07 DIAGNOSIS — J189 Pneumonia, unspecified organism: Secondary | ICD-10-CM | POA: Diagnosis not present

## 2013-06-07 DIAGNOSIS — D649 Anemia, unspecified: Secondary | ICD-10-CM | POA: Diagnosis present

## 2013-06-07 DIAGNOSIS — R5383 Other fatigue: Secondary | ICD-10-CM | POA: Diagnosis not present

## 2013-06-07 DIAGNOSIS — I10 Essential (primary) hypertension: Secondary | ICD-10-CM | POA: Diagnosis present

## 2013-06-07 DIAGNOSIS — R1319 Other dysphagia: Secondary | ICD-10-CM

## 2013-06-07 DIAGNOSIS — R42 Dizziness and giddiness: Secondary | ICD-10-CM | POA: Diagnosis present

## 2013-06-07 DIAGNOSIS — G8929 Other chronic pain: Secondary | ICD-10-CM | POA: Diagnosis present

## 2013-06-07 DIAGNOSIS — Z79899 Other long term (current) drug therapy: Secondary | ICD-10-CM

## 2013-06-07 DIAGNOSIS — R5381 Other malaise: Secondary | ICD-10-CM | POA: Diagnosis not present

## 2013-06-07 DIAGNOSIS — J9600 Acute respiratory failure, unspecified whether with hypoxia or hypercapnia: Secondary | ICD-10-CM | POA: Diagnosis present

## 2013-06-07 DIAGNOSIS — M79609 Pain in unspecified limb: Secondary | ICD-10-CM

## 2013-06-07 DIAGNOSIS — R059 Cough, unspecified: Secondary | ICD-10-CM | POA: Diagnosis not present

## 2013-06-07 DIAGNOSIS — D72829 Elevated white blood cell count, unspecified: Secondary | ICD-10-CM | POA: Diagnosis present

## 2013-06-07 DIAGNOSIS — G1221 Amyotrophic lateral sclerosis: Secondary | ICD-10-CM | POA: Diagnosis present

## 2013-06-07 DIAGNOSIS — R6889 Other general symptoms and signs: Secondary | ICD-10-CM | POA: Diagnosis not present

## 2013-06-07 DIAGNOSIS — J309 Allergic rhinitis, unspecified: Secondary | ICD-10-CM

## 2013-06-07 DIAGNOSIS — R05 Cough: Secondary | ICD-10-CM | POA: Diagnosis not present

## 2013-06-07 DIAGNOSIS — R0602 Shortness of breath: Secondary | ICD-10-CM | POA: Diagnosis not present

## 2013-06-07 LAB — COMPREHENSIVE METABOLIC PANEL
ALT: 23 U/L (ref 0–53)
AST: 28 U/L (ref 0–37)
Albumin: 3.9 g/dL (ref 3.5–5.2)
Alkaline Phosphatase: 90 U/L (ref 39–117)
BUN: 11 mg/dL (ref 6–23)
CALCIUM: 8.9 mg/dL (ref 8.4–10.5)
CHLORIDE: 102 meq/L (ref 96–112)
CO2: 23 mEq/L (ref 19–32)
Creatinine, Ser: 0.33 mg/dL — ABNORMAL LOW (ref 0.50–1.35)
GFR calc Af Amer: >90 mL/min (ref >90)
GFR calc non Af Amer: >90 mL/min (ref >90)
Glucose, Bld: 133 mg/dL — ABNORMAL HIGH (ref 70–99)
Potassium: 3.7 mEq/L (ref 3.7–5.3)
SODIUM: 141 meq/L (ref 137–147)
Total Bilirubin: 0.6 mg/dL (ref 0.3–1.2)
Total Protein: 8.1 g/dL (ref 6.0–8.3)

## 2013-06-07 LAB — CBC WITH DIFFERENTIAL/PLATELET
BASOS ABS: 0 10*3/uL (ref 0.0–0.1)
Basophils Relative: 0 % (ref 0–1)
EOS PCT: 0 % (ref 0–5)
Eosinophils Absolute: 0 10*3/uL (ref 0.0–0.7)
HCT: 42.4 % (ref 39.0–52.0)
Hemoglobin: 14.3 g/dL (ref 13.0–17.0)
LYMPHS PCT: 7 % — AB (ref 12–46)
Lymphs Abs: 1 10*3/uL (ref 0.7–4.0)
MCH: 29.6 pg (ref 26.0–34.0)
MCHC: 33.7 g/dL (ref 30.0–36.0)
MCV: 87.8 fL (ref 78.0–100.0)
Monocytes Absolute: 1 10*3/uL (ref 0.1–1.0)
Monocytes Relative: 7 % (ref 3–12)
NEUTROS ABS: 12.9 10*3/uL — AB (ref 1.7–7.7)
Neutrophils Relative %: 87 % — ABNORMAL HIGH (ref 43–77)
PLATELETS: 251 10*3/uL (ref 150–400)
RBC: 4.83 MIL/uL (ref 4.22–5.81)
RDW: 13.6 % (ref 11.5–15.5)
WBC: 15 10*3/uL — ABNORMAL HIGH (ref 4.0–10.5)

## 2013-06-07 LAB — CG4 I-STAT (LACTIC ACID): LACTIC ACID, VENOUS: 2.57 mmol/L — AB (ref 0.5–2.2)

## 2013-06-07 LAB — PRO B NATRIURETIC PEPTIDE: Pro B Natriuretic peptide (BNP): 126.8 pg/mL — ABNORMAL HIGH (ref 0–125)

## 2013-06-07 LAB — TROPONIN I: Troponin I: <0.3 ng/mL (ref <0.30)

## 2013-06-07 LAB — D-DIMER, QUANTITATIVE (NOT AT ARMC): D DIMER QUANT: 0.63 ug{FEU}/mL — AB (ref 0.00–0.48)

## 2013-06-07 LAB — INFLUENZA PANEL BY PCR (TYPE A & B)
H1N1FLUPCR: NOT DETECTED
INFLAPCR: NEGATIVE
Influenza B By PCR: NEGATIVE

## 2013-06-07 MED ORDER — SODIUM CHLORIDE 0.9 % IV SOLN
INTRAVENOUS | Status: AC
  Administered 2013-06-07: 07:00:00 via INTRAVENOUS

## 2013-06-07 MED ORDER — OSELTAMIVIR PHOSPHATE 75 MG PO CAPS
75.0000 mg | ORAL_CAPSULE | Freq: Two times a day (BID) | ORAL | Status: DC
  Filled 2013-06-07: qty 1

## 2013-06-07 MED ORDER — LEVOFLOXACIN IN D5W 750 MG/150ML IV SOLN
750.0000 mg | Freq: Once | INTRAVENOUS | Status: AC
  Administered 2013-06-07: 750 mg via INTRAVENOUS
  Filled 2013-06-07: qty 150

## 2013-06-07 MED ORDER — IPRATROPIUM-ALBUTEROL 0.5-2.5 (3) MG/3ML IN SOLN
3.0000 mL | RESPIRATORY_TRACT | Status: DC
  Administered 2013-06-07 (×4): 3 mL via RESPIRATORY_TRACT
  Filled 2013-06-07 (×5): qty 3

## 2013-06-07 MED ORDER — AMLODIPINE BESYLATE 10 MG PO TABS
10.0000 mg | ORAL_TABLET | Freq: Every day | ORAL | Status: DC
  Filled 2013-06-07: qty 1

## 2013-06-07 MED ORDER — METHYLPREDNISOLONE SODIUM SUCC 125 MG IJ SOLR
125.0000 mg | Freq: Once | INTRAMUSCULAR | Status: AC
  Administered 2013-06-07: 125 mg via INTRAVENOUS
  Filled 2013-06-07: qty 2

## 2013-06-07 MED ORDER — ATENOLOL 50 MG PO TABS
50.0000 mg | ORAL_TABLET | Freq: Every day | ORAL | Status: DC
  Administered 2013-06-07 – 2013-06-09 (×3): 50 mg via ORAL
  Filled 2013-06-07 (×3): qty 1

## 2013-06-07 MED ORDER — SODIUM CHLORIDE 0.9 % IV SOLN
INTRAVENOUS | Status: DC
Start: 2013-06-07 — End: 2013-06-09
  Administered 2013-06-07: 21:00:00 via INTRAVENOUS

## 2013-06-07 MED ORDER — AMLODIPINE BESY-BENAZEPRIL HCL 10-40 MG PO CAPS: 1.0000 | ORAL_CAPSULE | Freq: Every day | ORAL | Status: DC

## 2013-06-07 MED ORDER — PANTOPRAZOLE SODIUM 40 MG PO TBEC
40.0000 mg | DELAYED_RELEASE_TABLET | Freq: Every day | ORAL | Status: DC
Start: 2013-06-07 — End: 2013-06-09
  Administered 2013-06-07 – 2013-06-09 (×3): 40 mg via ORAL
  Filled 2013-06-07 (×3): qty 1

## 2013-06-07 MED ORDER — LORATADINE 10 MG PO TABS
10.0000 mg | ORAL_TABLET | Freq: Every day | ORAL | Status: DC
  Administered 2013-06-07 – 2013-06-09 (×3): 10 mg via ORAL
  Filled 2013-06-07 (×3): qty 1

## 2013-06-07 MED ORDER — HYDRALAZINE HCL 20 MG/ML IJ SOLN
10.0000 mg | INTRAMUSCULAR | Status: DC | PRN
  Filled 2013-06-07: qty 0.5

## 2013-06-07 MED ORDER — OSELTAMIVIR PHOSPHATE 75 MG PO CAPS
75.0000 mg | ORAL_CAPSULE | Freq: Two times a day (BID) | ORAL | Status: DC
  Filled 2013-06-07 (×2): qty 1

## 2013-06-07 MED ORDER — IPRATROPIUM-ALBUTEROL 0.5-2.5 (3) MG/3ML IN SOLN
3.0000 mL | Freq: Once | RESPIRATORY_TRACT | Status: AC
  Administered 2013-06-07: 3 mL via RESPIRATORY_TRACT
  Filled 2013-06-07: qty 3

## 2013-06-07 MED ORDER — BENAZEPRIL HCL 40 MG PO TABS
40.0000 mg | ORAL_TABLET | Freq: Every day | ORAL | Status: DC
  Administered 2013-06-07 – 2013-06-09 (×3): 40 mg via ORAL
  Filled 2013-06-07 (×3): qty 1

## 2013-06-07 MED ORDER — ALBUTEROL SULFATE (2.5 MG/3ML) 0.083% IN NEBU: 2.5000 mg | INHALATION_SOLUTION | Freq: Four times a day (QID) | RESPIRATORY_TRACT | Status: DC | PRN

## 2013-06-07 MED ORDER — OSELTAMIVIR PHOSPHATE 75 MG PO CAPS
75.0000 mg | ORAL_CAPSULE | Freq: Once | ORAL | Status: AC
  Administered 2013-06-07: 75 mg via ORAL
  Filled 2013-06-07: qty 1

## 2013-06-07 MED ORDER — ENOXAPARIN SODIUM 40 MG/0.4ML ~~LOC~~ SOLN
40.0000 mg | SUBCUTANEOUS | Status: DC
Start: 2013-06-07 — End: 2013-06-09
  Administered 2013-06-07 – 2013-06-09 (×3): 40 mg via SUBCUTANEOUS
  Filled 2013-06-07 (×3): qty 0.4

## 2013-06-07 MED ORDER — LEVOFLOXACIN IN D5W 750 MG/150ML IV SOLN
750.0000 mg | INTRAVENOUS | Status: DC
  Administered 2013-06-08: 750 mg via INTRAVENOUS
  Filled 2013-06-07: qty 150

## 2013-06-07 MED ORDER — AMLODIPINE BESYLATE 10 MG PO TABS
10.0000 mg | ORAL_TABLET | Freq: Every day | ORAL | Status: DC
  Administered 2013-06-07 – 2013-06-09 (×3): 10 mg via ORAL
  Filled 2013-06-07 (×3): qty 1

## 2013-06-07 MED ORDER — SODIUM CHLORIDE 0.9 % IV BOLUS (SEPSIS)
500.0000 mL | Freq: Once | INTRAVENOUS | Status: AC
  Administered 2013-06-07: 500 mL via INTRAVENOUS

## 2013-06-07 MED ORDER — IPRATROPIUM-ALBUTEROL 0.5-2.5 (3) MG/3ML IN SOLN
3.0000 mL | RESPIRATORY_TRACT | Status: DC
  Administered 2013-06-08 (×4): 3 mL via RESPIRATORY_TRACT
  Filled 2013-06-07 (×4): qty 3

## 2013-06-07 NOTE — Progress Notes (Deleted)
UR completed 

## 2013-06-07 NOTE — Progress Notes (Signed)
VASCULAR LAB PRELIMINARY  PRELIMINARY  PRELIMINARY  PRELIMINARY  Bilateral lower extremity venous duplex completed.    Preliminary report:  Bilateral:  No evidence of DVT, superficial thrombosis, or Baker's Cyst.   Garrison Michie, RVS 06/07/2013, 11:00 AM

## 2013-06-07 NOTE — ED Notes (Signed)
Pt put on 3 L [place]

## 2013-06-07 NOTE — ED Notes (Signed)
Per PTAR report: pt from home: pt c/o of "not feeling well." Pt has unproductive cough pain in his chest when pt coughs.  Pt a/o x 4.  Pt is [place] but is able to speak conversationally. Pt does not normally walk at baselines and uses a wheelchair. PTAR VS: BP: 144/68, HR: 108, RR:20, CBG: 121.  PTAR put pt on 2L d/t pt's O2 being around 90%RA. PTAR noted wheezes on the left lung.

## 2013-06-07 NOTE — H&P (Signed)
[facility] History and Physical  Patient: Thomas Raymond  NFA:[id number]  DOB: 05/14/1945  DOS: the patient was seen and examined on 06/07/2013 PCP: Oliver BarreJames [name], MD  Chief Complaint: Cough and shortness of breath  HPI: Thomas Raymond is a 68 y.o. male with Past medical history of hypertension dyslipidemia ALS. The patient is coming from home. The patient presents with complaints of cough and shortness of breath that has been ongoing since Sunday afternoon. Initially the cough was which greenish expectoration but right now he has dry cough. He denies taking any over-the-counter medications for the cough. He had fever and chills as well. He denies any nausea vomiting or choking on his meals. Denies any abdominal pain cough. He does have some sore throat. At his baseline he uses wheelchair to move around due to his ALS. He has chronic bilateral lower extremity pain. He has history of allergic rhinitis and MRSA pneumonia years ago. No recent travel mole sick contacts.  Review of Systems: as mentioned in the history of present illness.  A Comprehensive review of the other systems is negative.  Past Medical History  Diagnosis Date  . ALLERGIC RHINITIS   . ALS   . HYPERLIPIDEMIA   . HYPERTENSION   . VERTIGO   . LOW BACK PAIN   . Impaired glucose tolerance 02/17/2011   Past Surgical History  Procedure Laterality Date  . Lipoma removal  2003  . Tracheostomy    . Percutaneous gastrostomy tube repalcement      04/1999 and removed 07/1999   Social History:  reports that he has never smoked. He has never used smokeless tobacco. He reports that he does not drink alcohol or use illicit drugs. Partially Independent for most of his  ADL.  No Known Allergies  No family history on file.  Prior to Admission medications   Medication Sig Start Date End Date Taking? Authorizing Provider  amLODipine-benazepril (LOTREL) 10-40 MG per capsule Take 1 capsule by mouth daily. 02/23/13  Yes Corwin LevinsJames W [name], MD   atenolol (TENORMIN) 50 MG tablet TAKE 1 TABLET BY MOUTH EVERY DAY AT BEDTIME 10/05/12  Yes Fayrene HelperBowie Tran, PA-C  cetirizine (ZYRTEC) 10 MG tablet TAKE 1 TABLET BY MOUTH EVERY DAY 04/29/13  Yes Corwin LevinsJames W [name], MD  omeprazole (PRILOSEC) 20 MG capsule TAKE ONE CAPSULE BY MOUTH EVERY DAY 04/19/13  Yes Corwin LevinsJames W [name], MD  meclizine (ANTIVERT) 25 MG tablet Take 1 tablet (25 mg total) by mouth 3 (three) times daily as needed for dizziness or nausea. 03/15/11 03/14/12  Corwin LevinsJames W [name], MD    Physical Exam: Filed Vitals:   06/07/13 0421 06/07/13 0439 06/07/13 0515 06/07/13 0555  BP: 183/82   141/112  Pulse: 102  92 87  Temp: 98.6 F (37 C)     TempSrc: Oral     Resp: 22  29 31   SpO2: 92% 92% 91% 91%    General: Alert, Awake and Oriented to Time, Place and Person. Appear in marked distress Eyes: PERRL ENT: Oral Mucosa clear moist. Neck: No JVD Cardiovascular: S1 and S2 Present, no Murmur, Peripheral Pulses Present Respiratory: Bilateral Air entry equal and Decreased, bilateral Crackles, bilateral expiratory wheezes Abdomen: Bowel Sound Present, Soft and Non tender Skin: No Rash Extremities: No Pedal edema, leg tenderness Neurologic: Grossly Unremarkable.  Labs on Admission:  CBC:  Recent Labs Lab 06/07/13 0447  WBC 15.0*  NEUTROABS 12.9*  HGB 14.3  HCT 42.4  MCV 87.8  PLT 251    CMP  Component Value Date/Time   NA 141 06/07/2013 0447   K 3.7 06/07/2013 0447   CL 102 06/07/2013 0447   CO2 23 06/07/2013 0447   GLUCOSE 133* 06/07/2013 0447   BUN 11 06/07/2013 0447   CREATININE 0.33* 06/07/2013 0447   CALCIUM 8.9 06/07/2013 0447   PROT 8.1 06/07/2013 0447   ALBUMIN 3.9 06/07/2013 0447   AST 28 06/07/2013 0447   ALT 23 06/07/2013 0447   ALKPHOS 90 06/07/2013 0447   BILITOT 0.6 06/07/2013 0447   GFRNONAA >90 06/07/2013 0447   GFRAA >90 06/07/2013 0447    No results found for this basename: LIPASE, AMYLASE,  in the last 168 hours No results found for this basename: AMMONIA,  in the last 168  hours   Recent Labs Lab 06/07/13 0447  TROPONINI <0.30   BNP (last 3 results)  Recent Labs  06/07/13 0447  PROBNP 126.8*    Radiological Exams on Admission: Dg Chest Port 1 View  06/07/2013   CLINICAL DATA:  Shortness of breath, cough.  EXAM: PORTABLE CHEST - 1 VIEW  COMPARISON:  01/21/2011  FINDINGS: Slight elevation of the right hemidiaphragm. Patchy bilateral lower lobe airspace opacities. This could represent atelectasis or pneumonia. Mild cardiomegaly. No effusions. No acute bony abnormality.  IMPRESSION: Patchy bilateral lower lobe opacities, atelectasis versus pneumonia.   Electronically Signed   By: Charlett Nose M.D.   On: 06/07/2013 04:56    EKG: Independently reviewed. normal EKG, normal sinus rhythm, nonspecific ST and T waves changes.  Assessment/Plan Principal Problem:   CAP (community acquired pneumonia) Active Problems:   ALS   HYPERTENSION   1. CAP (community acquired pneumonia) The patient is presenting with complaints of cough and shortness of breath. Chest x-ray is suggestive of atypical atelectasis versus pneumonia. He has fever and leukocytosis with hypoxia. Most likely he has everyday quite pneumonia for which I would start him on IV levofloxacin. Blood cultures urine antigens and sputum cultures are ordered. I would also check influenza PCR and start him empirically on Tamiflu since he is within 72 hour. Speech evaluation to rule out possible dysphagia.  2. Chest pain Since he is acute hypoxic respiratory failure and at his baseline he is not ambulatory and in mobilized it I will check a d-dimer. His EKG does not show any signs of acute distress follow serial troponin  3. Accelerated hypertension IV hydralazine, continue home antihypertensive medications.  DVT Prophylaxis: subcutaneous Heparin Nutrition: N.p.o. until speech evaluation otherwise cardiac diet  Code Status: Full  Family Communication: Family was present at bedside, opportunity was  given to ask question and all questions were answered satisfactorily at the time of interview. Disposition: Admitted to inpatient in telemetry unit.  Author: Lynden Oxford, MD [facility] Pager: [phone] 06/07/2013, 6:23 AM    If 7PM-7AM, please contact night-coverage [email or web address] Password [email or web address]

## 2013-06-07 NOTE — ED Notes (Signed)
Notified EDP, [place]el[place],MD. Pt. I-stat CG4 lactic acid results 2.57.

## 2013-06-07 NOTE — ED Provider Notes (Signed)
CSN: [id number]     Arrival date & time 06/07/13  0407 History   First MD Initiated Contact with Patient 06/07/13 0408     Chief Complaint  Patient presents with  . Cough     (Consider location/radiation/quality/duration/timing/severity/associated sxs/prior Treatment) HPI Patient has a history of ALS and has limited mobility. He presents from home with increasing shortness of breath since yesterday afternoon. He's had a cough is nonproductive. He's had shaking chills and subjective fevers. He denies any lower extremity swelling or pain. Patient with no new sick contacts. He denies any neck pain or stiffness. He has no chest pain or abdominal pain. Past Medical History  Diagnosis Date  . ALLERGIC RHINITIS   . ALS   . HYPERLIPIDEMIA   . HYPERTENSION   . VERTIGO   . LOW BACK PAIN   . Impaired glucose tolerance 02/17/2011   Past Surgical History  Procedure Laterality Date  . Lipoma removal  2003  . Tracheostomy    . Percutaneous gastrostomy tube repalcement      04/1999 and removed 07/1999   No family history on file. History  Substance Use Topics  . Smoking status: Never Smoker   . Smokeless tobacco: Never Used  . Alcohol Use: No    Review of Systems  Constitutional: Positive for fever, chills and fatigue.  HENT: Negative for congestion.   Respiratory: Positive for cough, shortness of breath and wheezing.   Cardiovascular: Negative for chest pain, palpitations and leg swelling.  Gastrointestinal: Negative for nausea, vomiting, abdominal pain and diarrhea.  Musculoskeletal: Negative for back pain, myalgias, neck pain and neck stiffness.  Skin: Negative for rash and wound.  Neurological: Positive for weakness ( Decreased strength bilateral lower extremities but is unchanged.) and numbness (decreased sensation bilateral lower extremities unchanged.). Negative for dizziness.  All other systems reviewed and are negative.      Allergies  Review of patient's allergies  indicates no known allergies.  Home Medications   Current Outpatient Rx  Name  Route  Sig  Dispense  Refill  . amLODipine-benazepril (LOTREL) 10-40 MG per capsule   Oral   Take 1 capsule by mouth daily.   90 capsule   3   . atenolol (TENORMIN) 50 MG tablet      TAKE 1 TABLET BY MOUTH EVERY DAY AT BEDTIME   90 tablet   3   . cetirizine (ZYRTEC) 10 MG tablet      TAKE 1 TABLET BY MOUTH EVERY DAY   90 tablet   1   . omeprazole (PRILOSEC) 20 MG capsule      TAKE ONE CAPSULE BY MOUTH EVERY DAY   90 capsule   1   . EXPIRED: meclizine (ANTIVERT) 25 MG tablet   Oral   Take 1 tablet (25 mg total) by mouth 3 (three) times daily as needed for dizziness or nausea.   60 tablet   1    BP 183/82  Pulse 92  Temp(Src) 98.6 F (37 C) (Oral)  Resp 29  SpO2 91% Physical Exam  Nursing note and vitals reviewed. Constitutional: He is oriented to person, place, and time. He appears well-developed and well-nourished. No distress.  HENT:  Head: Normocephalic and atraumatic.  Mouth/Throat: Oropharynx is clear and moist. No oropharyngeal exudate.  Eyes: EOM are normal. Pupils are equal, round, and reactive to light.  Neck: Normal range of motion. Neck supple.  Cardiovascular: Normal rate and regular rhythm.   Pulmonary/Chest: Effort normal. No respiratory distress. He has wheezes.  He has no rales. He exhibits no tenderness.  Patient speaking in full sentences. Increased respiratory effort. Decreased breath sounds bilaterally in the bases with mild expiratory wheezing.  Abdominal: Soft. Bowel sounds are normal. He exhibits no distension and no mass. There is no tenderness. There is no rebound and no guarding.  Musculoskeletal: Normal range of motion. He exhibits no edema and no tenderness.  No calf swelling or tenderness.  Neurological: He is alert and oriented to person, place, and time.  5/5 motor in bilateral upper extremities. 4/5 motor in bilateral lower extremities. Decreased  sensation bilateral lower extremities. Patient is nonambulatory at this time.  Skin: Skin is warm and dry. No rash noted. No erythema.  Psychiatric: He has a normal mood and affect. His behavior is normal.    ED Course  Procedures (including critical care time) Labs Review Labs Reviewed  CBC WITH DIFFERENTIAL - Abnormal; Notable for the following:    WBC 15.0 (*)    Neutrophils Relative % 87 (*)    Neutro Abs 12.9 (*)    Lymphocytes Relative 7 (*)    All other components within normal limits  COMPREHENSIVE METABOLIC PANEL - Abnormal; Notable for the following:    Glucose, Bld 133 (*)    Creatinine, Ser 0.33 (*)    All other components within normal limits  PRO B NATRIURETIC PEPTIDE - Abnormal; Notable for the following:    Pro B Natriuretic peptide (BNP) 126.8 (*)    All other components within normal limits  CULTURE, BLOOD (ROUTINE X 2)  CULTURE, BLOOD (ROUTINE X 2)  TROPONIN I  INFLUENZA PANEL BY PCR (TYPE A & B, H1N1)   Imaging Review Dg Chest Port 1 View  06/07/2013   CLINICAL DATA:  Shortness of breath, cough.  EXAM: PORTABLE CHEST - 1 VIEW  COMPARISON:  01/21/2011  FINDINGS: Slight elevation of the right hemidiaphragm. Patchy bilateral lower lobe airspace opacities. This could represent atelectasis or pneumonia. Mild cardiomegaly. No effusions. No acute bony abnormality.  IMPRESSION: Patchy bilateral lower lobe opacities, atelectasis versus pneumonia.   Electronically Signed   By: Charlett NoseKevin  Dover M.D.   On: 06/07/2013 04:56    EKG Interpretation   None       Date: 06/07/2013  Rate: 106  Rhythm: sinus tachycardia  QRS Axis: normal  Intervals: normal  ST/T Wave abnormalities: nonspecific T wave changes  Conduction Disutrbances:none  Narrative Interpretation:   Old EKG Reviewed: none available   MDM   Final diagnoses:  None   Patient states his breathing is mildly improved after the nebulized treatment. He still requiring 3 L of oxygen to keep his saturations  above 90%. Chest x-ray with bilateral lower lobe infiltrates. Given his history of subjective fevers and elevated white blood cell count likely has a community-acquired pneumonia. Will start on antibiotics and will need admission due to hypoxia.   Discussed with Dr. Allena KatzPatel. Will admit to telemetry bed.  Loren Raceravid Layali Freund, MD 06/07/13 [id number]

## 2013-06-07 NOTE — ED Notes (Signed)
Initial contact - pt resting on stretcher with family at bs, pt maintained on droplet precautions at this time.  Pt reports pain and points to upper back, midline, pt assisted to reposition for comfort and reports feeling better.  Pt denies SOB/CP, speaking full/clear sentences, rr even/un-lab.  Lsctab.  Skin PWD.  MAEI.  Maintained on cardiac/02 monitor, SR no ectopy.  NAD.  Awaiting inpt bed placement.

## 2013-06-07 NOTE — Progress Notes (Signed)
[facility] PROGRESS NOTE  [place] ZOX:[id number] DOB: 12/06/1945 DOA: 06/07/2013 PCP: Oliver BarreJames [name], MD  Assessment/Plan  Acute hypoxic respiratory failure due to CAP (community acquired pneumonia) Chest x-ray is suggestive of atypical atelectasis versus pneumonia, but fever and leukocytosis suggestive of infection.  At risk for aspiration PNA due to ALS. - cont levofloxacin -  Continue tamiflu -  F/u BCx -  S. pneumo -  Legionella -  Sputum cx if able -  Flu PCR neg -  Speech therapy consult -  Wean o2 as tolerated  Elevated lactic acid suggestive of dehydration -  Continue IVF  Chest pain, ECG stable.  Likely related to pneumonia -  D-dimer below threshold of 0.68 given patient's age.  No need for CTa at this time -  Telemetry -  Troponin neg so far  HTN, elevated BP likely secondary to stress and pain and resolved quickly -  Continue atenolol, benazeprim, norvasc -  Continue prn IV hydralazine  ALS and uses wheelchair for ambulation  Chronic pain in legs -  Venous duplex neg for DVT  Diet:  Healthy heart Access:  PIV IVF:  yes Proph:  lovenox  Code Status: full Family Communication: patient Disposition Plan: pending improvement in dyspnea/tachypnea.     Consultants:  None  Procedures:  CXR  Antibiotics:  Levofloxacin 2/16 >>  Tamiflu 2/16 x 1 dose  HPI/Subjective:  States he still feels SOB    Objective: Filed Vitals:   06/07/13 1115 06/07/13 1358 06/07/13 1503 06/07/13 1533  BP: 122/54 126/47 127/59 137/61  Pulse: 65 60 61 62  Temp:    97.9 F (36.6 C)  TempSrc:    Oral  Resp: 24 24 30    Height:    5\' 9"  (1.753 m)  Weight:    71.2 kg (156 lb 15.5 oz)  SpO2: 96% 96% 95%    No intake or output data in the 24 hours ending 06/07/13 1602 Filed Weights   06/07/13 1533  Weight: 71.2 kg (156 lb 15.5 oz)    Exam:   General:  Adult male, No acute distress, + tachypnea, nasal canula in place  HEENT:  NCAT, MMM  Cardiovascular:   RRR, nl S1, S2, 2+ pulses, warm extremities  Respiratory:  Rales at bilateral bases and wheezes, no rhonchi, no increased WOB  Abdomen:   NABS, soft, NT/ND  MSK:   Normal tone and bulk, no LEE  Neuro:  Grossly intact  Data Reviewed: Basic Metabolic Panel:  Recent Labs Lab 06/07/13 0447  NA 141  K 3.7  CL 102  CO2 23  GLUCOSE 133*  BUN 11  CREATININE 0.33*  CALCIUM 8.9   Liver Function Tests:  Recent Labs Lab 06/07/13 0447  AST 28  ALT 23  ALKPHOS 90  BILITOT 0.6  PROT 8.1  ALBUMIN 3.9   No results found for this basename: LIPASE, AMYLASE,  in the last 168 hours No results found for this basename: AMMONIA,  in the last 168 hours CBC:  Recent Labs Lab 06/07/13 0447  WBC 15.0*  NEUTROABS 12.9*  HGB 14.3  HCT 42.4  MCV 87.8  PLT 251   Cardiac Enzymes:  Recent Labs Lab 06/07/13 0447  TROPONINI <0.30   BNP (last 3 results)  Recent Labs  06/07/13 0447  PROBNP 126.8*   CBG: No results found for this basename: GLUCAP,  in the last 168 hours  No results found for this or any previous visit (from the past 240 hour(s)).   Studies:  Dg Chest Port 1 View  06/07/2013   CLINICAL DATA:  Shortness of breath, cough.  EXAM: PORTABLE CHEST - 1 VIEW  COMPARISON:  01/21/2011  FINDINGS: Slight elevation of the right hemidiaphragm. Patchy bilateral lower lobe airspace opacities. This could represent atelectasis or pneumonia. Mild cardiomegaly. No effusions. No acute bony abnormality.  IMPRESSION: Patchy bilateral lower lobe opacities, atelectasis versus pneumonia.   Electronically Signed   By: Charlett Nose M.D.   On: 06/07/2013 04:56    Scheduled Meds: . sodium chloride   Intravenous STAT  . amLODipine  10 mg Oral Daily  . atenolol  50 mg Oral Daily  . benazepril  40 mg Oral Daily  . enoxaparin (LOVENOX) injection  40 mg Subcutaneous Q24H  . ipratropium-albuterol  3 mL Nebulization Q4H  . levofloxacin (LEVAQUIN) IV  750 mg Intravenous Q24H  . loratadine  10 mg  Oral Daily  . oseltamivir  75 mg Oral BID  . pantoprazole  40 mg Oral Daily   Continuous Infusions:   Principal Problem:   CAP (community acquired pneumonia) Active Problems:   ALS   HYPERTENSION    Time spent: 30 min    Aqueelah Cotrell  [facility] Pager [phone]. If 7PM-7AM, please contact night-coverage at [email or web address], password [facility] 06/07/2013, 4:02 PM  LOS: 0 days

## 2013-06-07 NOTE — ED Notes (Signed)
RT notified of pending neb order 

## 2013-06-08 DIAGNOSIS — G1221 Amyotrophic lateral sclerosis: Secondary | ICD-10-CM | POA: Diagnosis not present

## 2013-06-08 DIAGNOSIS — R1319 Other dysphagia: Secondary | ICD-10-CM | POA: Diagnosis not present

## 2013-06-08 DIAGNOSIS — J189 Pneumonia, unspecified organism: Secondary | ICD-10-CM | POA: Diagnosis not present

## 2013-06-08 LAB — CBC
HCT: 37 % — ABNORMAL LOW (ref 39.0–52.0)
Hemoglobin: 12.2 g/dL — ABNORMAL LOW (ref 13.0–17.0)
MCH: 29.3 pg (ref 26.0–34.0)
MCHC: 33 g/dL (ref 30.0–36.0)
MCV: 88.7 fL (ref 78.0–100.0)
PLATELETS: 226 10*3/uL (ref 150–400)
RBC: 4.17 MIL/uL — ABNORMAL LOW (ref 4.22–5.81)
RDW: 13.9 % (ref 11.5–15.5)
WBC: 15.3 10*3/uL — ABNORMAL HIGH (ref 4.0–10.5)

## 2013-06-08 LAB — BASIC METABOLIC PANEL
BUN: 12 mg/dL (ref 6–23)
CHLORIDE: 108 meq/L (ref 96–112)
CO2: 26 meq/L (ref 19–32)
CREATININE: 0.42 mg/dL — AB (ref 0.50–1.35)
Calcium: 8.7 mg/dL (ref 8.4–10.5)
GFR calc Af Amer: >90 mL/min (ref >90)
GFR calc non Af Amer: >90 mL/min (ref >90)
Glucose, Bld: 147 mg/dL — ABNORMAL HIGH (ref 70–99)
POTASSIUM: 4.1 meq/L (ref 3.7–5.3)
Sodium: 142 mEq/L (ref 137–147)

## 2013-06-08 LAB — LEGIONELLA ANTIGEN, URINE: LEGIONELLA ANTIGEN, URINE: NEGATIVE

## 2013-06-08 LAB — STREP PNEUMONIAE URINARY ANTIGEN: Strep Pneumo Urinary Antigen: NEGATIVE

## 2013-06-08 MED ORDER — IPRATROPIUM-ALBUTEROL 0.5-2.5 (3) MG/3ML IN SOLN
3.0000 mL | Freq: Three times a day (TID) | RESPIRATORY_TRACT | Status: DC
Start: 2013-06-09 — End: 2013-06-09
  Administered 2013-06-09: 3 mL via RESPIRATORY_TRACT
  Filled 2013-06-08: qty 3

## 2013-06-08 MED ORDER — LEVOFLOXACIN 750 MG PO TABS
750.0000 mg | ORAL_TABLET | ORAL | Status: DC
  Administered 2013-06-09: 750 mg via ORAL
  Filled 2013-06-08 (×2): qty 1

## 2013-06-08 NOTE — Evaluation (Signed)
Clinical/Bedside Swallow Evaluation Patient Details  Name: Thomas Raymond MRN: [id number] Date of Birth: 07/02/1945  Today's Date: 06/08/2013 Time: 1030-1100 SLP Time Calculation (min): 30 min  Past Medical History:  Past Medical History  Diagnosis Date  . ALLERGIC RHINITIS   . ALS   . HYPERLIPIDEMIA   . HYPERTENSION   . VERTIGO   . LOW BACK PAIN   . Impaired glucose tolerance 02/17/2011   Past Surgical History:  Past Surgical History  Procedure Laterality Date  . Lipoma removal  2003  . Tracheostomy    . Percutaneous gastrostomy tube repalcement      04/1999 and removed 4/200671   HPI:  68 year old male admitted 06/07/13 due to cough and SOB.  PMH significant for HTN, dyslipidemia, PEG tube placement/removal in 2001.  MBS x3 in 2001 (january, february, and april)   Assessment / Plan / Recommendation Clinical Impression  Pt had just completed oral care.  Oral motor strength and function appear adequate.  Pt reported short term dysphagia with PEG tube placement 15 years ago. He does not indicate any swallowing difficulty at this time, and none were observed. Given pt risk of aspiration associated with ALS, repeat MBS is recommended prior to DC.  No overt s/s aspiration were noted during this assessment, however, silent aspiration cannot be determined at bedside.  Pt reported that he did not feel objective study was necessary; that he was swallowing fine, but verbalized agreement with SLP plan to recommend MBS to MD.      Aspiration Risk  Moderate    Diet Recommendation Regular;Thin liquid   Liquid Administration via: Cup;Straw Medication Administration: Whole meds with liquid Supervision: Patient able to self feed Compensations: Slow rate;Small sips/bites Postural Changes and/or Swallow Maneuvers: Seated upright 90 degrees    Other  Recommendations Recommended Consults: MBS Oral Care Recommendations: Oral care BID   Follow Up Recommendations  None    Frequency and Duration min 1  x/week  1 week   Pertinent Vitals/Pain VSS    SLP Swallow Goals  tolerate least restrictive diet without overt s/s aspiration   Swallow Study Prior Functional Status   Dysphagia/PEG placement and subsequent removal in 2001.  No reported difficulty since then per pt.    General Date of Onset: 06/07/13 HPI: 68 year old male admitted 06/07/13 due to cough and SOB.  PMH significant for HTN, dyslipidemia, PEG tube placement/removal in 2001.  MBS x3 in 2001 (january, february, and april) Type of Study: Bedside swallow evaluation Previous Swallow Assessment: MBS x3 in 2001 - flash penetration of thin and thick liquids, vallecular sinus residue. Diet Prior to this Study: Regular;Thin liquids Temperature Spikes Noted: No Respiratory Status: Room air History of Recent Intubation: No Behavior/Cognition: Alert;Cooperative;Pleasant mood Oral Cavity - Dentition: Adequate natural dentition Self-Feeding Abilities: Able to feed self Patient Positioning: Upright in chair Baseline Vocal Quality: Clear Volitional Cough: Strong Volitional Swallow: Able to elicit    Oral/Motor/Sensory Function Overall Oral Motor/Sensory Function: Appears within functional limits for tasks assessed   Ice Chips Ice chips: Not tested   Thin Liquid Thin Liquid: Within functional limits Presentation: Straw    Nectar Thick Nectar Thick Liquid: Not tested   Honey Thick Honey Thick Liquid: Not tested   Puree Puree: Within functional limits Presentation: Self Fed;Spoon   Solid   GO    Solid: Within functional limits Presentation: Self Fed      Contina Strain B. Murvin NatalBueche, [facility] Surgery Center LLCMSP, CCC-SLP [phone]  Leigh AuroraBueche, Genevieve Ritzel Brown 06/08/2013,11:12 AM

## 2013-06-08 NOTE — Progress Notes (Signed)
SLP Note  Patient Details Name: Thomas Raymond MRN: [id number] DOB: 03/30/1946     Orders received for MBS.  Will schedule with radiology for Wednesday 06/09/13.  Thank you for this referral.  Celia B. Murvin NatalBueche, [facility] HospitalMSP, CCC-SLP [phone]  Leigh AuroraBueche, Celia Brown 06/08/2013, 1:34 PM

## 2013-06-08 NOTE — Progress Notes (Addendum)
[facility] PROGRESS NOTE  [place] ZOX:[id number] DOB: 1945-12-11 DOA: 06/07/2013 PCP: Oliver Barre, MD  Brief Summary  Thomas Raymond is a 68 y.o. male with Past medical history of hypertension dyslipidemia ALS.  The patient is coming from home.  The patient presents with complaints of cough and shortness of breath that has been ongoing since Sunday afternoon. Initially the cough was which greenish expectoration but right now he has dry cough. He denies taking any over-the-counter medications for the cough. He had fever and chills as well.  On levofloxacin for CAP.  Had wheezing at admission which improved with duonebs.     Assessment/Plan  Acute hypoxic respiratory failure due to CAP (community acquired pneumonia) Chest x-ray is suggestive of atypical atelectasis versus pneumonia, but fever and leukocytosis suggestive of infection.  At risk for aspiration PNA due to ALS. - cont levofloxacin day 2 -  BCx NGTD -  S. pneumo neg -  Legionella neg -  Sputum cx if able -  Flu PCR neg -  Speech therapy consulted:  Recommending MBS -  MBS ordered -  Wean o2 as tolerated.   -  Outpatient PFTs  Elevated lactic acid suggestive of dehydration -  Continue IVF  Chest pain, ECG stable.  Likely related to pneumonia -  D-dimer below threshold of 0.68 given patient's age.  No need for CTa at this time -  Telemetry:  Sinus brady -  Troponin neg   HTN, elevated BP likely secondary to stress and pain and resolved quickly -  Continue atenolol, benazeprim, norvasc -  Continue prn IV hydralazine  ALS and uses wheelchair for ambulation  Chronic pain in legs -  Venous duplex neg for DVT  Leukocytosis, stable, repeat in the morning Normocytic anemia, likely marrow suppression from acute illness, repeat tomorrow  Diet:  Healthy heart Access:  PIV IVF:  yes Proph:  lovenox  Code Status: full Family Communication: patient Disposition Plan: pending improvement in dyspnea/tachypnea. Possibly  home tomorrow if oxygen saturation stable with exertion.    Consultants:  None  Procedures:  CXR  Antibiotics:  Levofloxacin 2/16 >>  Tamiflu 2/16 x 1 dose  HPI/Subjective:  Breathing is improved. He continues to cough sputum that is clear. His wheeze has improved.  Objective: Filed Vitals:   06/07/13 1926 06/07/13 2123 06/08/13 0536 06/08/13 0810  BP:  139/65 135/64   Pulse:  58 57   Temp:  97.7 F (36.5 C) 98.3 F (36.8 C)   TempSrc:  Oral Oral   Resp:  26 24   Height:      Weight:      SpO2: 97% 94% 94% 93%    Intake/Output Summary (Last 24 hours) at 06/08/13 1330 Last data filed at 06/08/13 0700  Gross per 24 hour  Intake 208.75 ml  Output    750 ml  Net -541.25 ml   Filed Weights   06/07/13 1533  Weight: 71.2 kg (156 lb 15.5 oz)    Exam:   General:  Adult male, No acute distress, less tachypnea today.  HEENT:  NCAT, MMM  Cardiovascular:  RRR, nl S1, S2, 2+ pulses, warm extremities  Respiratory:  Rales at bilateral bases, no wheezes, no rhonchi, no increased WOB  Abdomen:   NABS, soft, NT/ND  MSK:   Normal tone and bulk, no LEE  Neuro:  Grossly intact  Data Reviewed: Basic Metabolic Panel:  Recent Labs Lab 06/07/13 0447 06/08/13 0420  NA 141 142  K 3.7 4.1  CL  102 108  CO2 23 26  GLUCOSE 133* 147*  BUN 11 12  CREATININE 0.33* 0.42*  CALCIUM 8.9 8.7   Liver Function Tests:  Recent Labs Lab 06/07/13 0447  AST 28  ALT 23  ALKPHOS 90  BILITOT 0.6  PROT 8.1  ALBUMIN 3.9   No results found for this basename: LIPASE, AMYLASE,  in the last 168 hours No results found for this basename: AMMONIA,  in the last 168 hours CBC:  Recent Labs Lab 06/07/13 0447 06/08/13 0420  WBC 15.0* 15.3*  NEUTROABS 12.9*  --   HGB 14.3 12.2*  HCT 42.4 37.0*  MCV 87.8 88.7  PLT 251 226   Cardiac Enzymes:  Recent Labs Lab 06/07/13 0447  TROPONINI <0.30   BNP (last 3 results)  Recent Labs  06/07/13 0447  PROBNP 126.8*    CBG: No results found for this basename: GLUCAP,  in the last 168 hours  Recent Results (from the past 240 hour(s))  CULTURE, BLOOD (ROUTINE X 2)     Status: None   Collection Time    06/07/13  4:47 AM      Result Value Ref Range Status   Specimen Description BLOOD LEFT ANTECUBITAL   Final   Special Requests BOTTLES DRAWN AEROBIC AND ANAEROBIC   Final   Culture  Setup Time     Final   Value: 06/07/2013 08:35     Performed at [facility]   Culture     Final   Value:        BLOOD CULTURE RECEIVED NO GROWTH TO DATE CULTURE WILL BE HELD FOR 5 DAYS BEFORE ISSUING A FINAL NEGATIVE REPORT     Performed at [facility]   Report Status PENDING   Incomplete  CULTURE, BLOOD (ROUTINE X 2)     Status: None   Collection Time    06/07/13  5:49 AM      Result Value Ref Range Status   Specimen Description BLOOD RIGHT ANTECUBITAL   Final   Special Requests BOTTLES DRAWN AEROBIC AND ANAEROBIC   Final   Culture  Setup Time     Final   Value: 06/07/2013 08:35     Performed at [facility]   Culture     Final   Value:        BLOOD CULTURE RECEIVED NO GROWTH TO DATE CULTURE WILL BE HELD FOR 5 DAYS BEFORE ISSUING A FINAL NEGATIVE REPORT     Performed at [facility]   Report Status PENDING   Incomplete     Studies: Dg Chest Port 1 View  06/07/2013   CLINICAL DATA:  Shortness of breath, cough.  EXAM: PORTABLE CHEST - 1 VIEW  COMPARISON:  01/21/2011  FINDINGS: Slight elevation of the right hemidiaphragm. Patchy bilateral lower lobe airspace opacities. This could represent atelectasis or pneumonia. Mild cardiomegaly. No effusions. No acute bony abnormality.  IMPRESSION: Patchy bilateral lower lobe opacities, atelectasis versus pneumonia.   Electronically Signed   By: Charlett Nose M.D.   On: 06/07/2013 04:56    Scheduled Meds: . amLODipine  10 mg Oral Daily  . atenolol  50 mg Oral Daily  . benazepril  40 mg Oral Daily  . enoxaparin (LOVENOX) injection  40 mg  Subcutaneous Q24H  . ipratropium-albuterol  3 mL Nebulization Q4H WA  . levofloxacin (LEVAQUIN) IV  750 mg Intravenous Q24H  . loratadine  10 mg Oral Daily  . pantoprazole  40 mg Oral Daily  Continuous Infusions: . sodium chloride 75 mL/hr at 06/07/13 2049    Principal Problem:   CAP (community acquired pneumonia) Active Problems:   ALS   HYPERTENSION    Time spent: 30 min    Trishna Cwik, [facility] CampusMACKENZIE  [facility] Pager [phone]. If 7PM-7AM, please contact night-coverage at [email or web address], password [email or web address] 06/08/2013, 1:30 PM  LOS: 1 day

## 2013-06-08 NOTE — Progress Notes (Signed)
PHARMACIST - PHYSICIAN COMMUNICATION CONCERNING: Antibiotic IV to Oral Route Change Policy  RECOMMENDATION: This patient is receiving Levaquin by the intravenous route.  Based on criteria approved by the Pharmacy and Therapeutics Committee, the antibiotic(s) is/are being converted to the equivalent oral dose form(s).   DESCRIPTION: These criteria include:  Patient being treated for a respiratory tract infection, urinary tract infection, or cellulitis  The patient is not neutropenic and does not exhibit a GI malabsorption state  The patient is eating (either orally or via tube) and/or has been taking other orally administered medications for a least 24 hours  The patient is improving clinically and has a Tmax < 100.5  If you have questions about this conversion, please contact the Pharmacy Department  []   [phone]( [phone] )  [facility] []   [phone]( [phone] )  [facility]  []   [phone]( [phone]  [facility] [x]   [phone]( [phone] )  [facility]   Geoffry Paradisehuyvan Chett Taniguchi, PharmD, [place] Pager: [phone] 2:03 PM Pharmacy #: [id number]

## 2013-06-09 ENCOUNTER — Inpatient Hospital Stay (HOSPITAL_COMMUNITY): Payer: Medicare Other

## 2013-06-09 ENCOUNTER — Other Ambulatory Visit: Payer: Self-pay | Admitting: Internal Medicine

## 2013-06-09 DIAGNOSIS — J189 Pneumonia, unspecified organism: Secondary | ICD-10-CM | POA: Diagnosis not present

## 2013-06-09 LAB — BASIC METABOLIC PANEL
BUN: 15 mg/dL (ref 6–23)
CALCIUM: 9 mg/dL (ref 8.4–10.5)
CHLORIDE: 104 meq/L (ref 96–112)
CO2: 26 mEq/L (ref 19–32)
CREATININE: 0.36 mg/dL — AB (ref 0.50–1.35)
Glucose, Bld: 101 mg/dL — ABNORMAL HIGH (ref 70–99)
Potassium: 3.9 mEq/L (ref 3.7–5.3)
Sodium: 140 mEq/L (ref 137–147)

## 2013-06-09 LAB — CBC
HEMATOCRIT: 38.8 % — AB (ref 39.0–52.0)
Hemoglobin: 12.8 g/dL — ABNORMAL LOW (ref 13.0–17.0)
MCH: 29.4 pg (ref 26.0–34.0)
MCHC: 33 g/dL (ref 30.0–36.0)
MCV: 89 fL (ref 78.0–100.0)
PLATELETS: 264 10*3/uL (ref 150–400)
RBC: 4.36 MIL/uL (ref 4.22–5.81)
RDW: 14.1 % (ref 11.5–15.5)
WBC: 12.3 10*3/uL — ABNORMAL HIGH (ref 4.0–10.5)

## 2013-06-09 MED ORDER — LEVOFLOXACIN 750 MG PO TABS: 750.0000 mg | ORAL_TABLET | ORAL | Status: DC

## 2013-06-09 NOTE — Discharge Summary (Signed)
Physician Discharge Summary  [place] Raymond MVH:[id number] DOB: 07/19/1945 DOA: 06/07/2013  PCP: Oliver BarreJames [name], MD  Admit date: 06/07/2013 Discharge date: 06/09/2013  Time spent: 35 minutes  Recommendations for Outpatient Follow-up:  1. Consider CBC and basic metabolic panel in one week 2. Please get repeat chest x-ray in about one month's to ensure clearing of the pneumonia   Discharge Diagnoses:  Principal Problem:   CAP (community acquired pneumonia) Active Problems:   HYPERLIPIDEMIA   ALS   HYPERTENSION   VERTIGO  Speech therapy recommendations Dysphagia 3 (Mechanical Soft);Thin liquid  Liquid Administration via: Cup  Medication Administration: Crushed with puree (crush if not contraindicated, follow with liquids)  Supervision: Patient able to self feed  Compensations: Slow rate;Small sips/bites;Multiple dry swallows after each bite/sip;Follow solids with liquid ("hock to clear throat at end of po intakel")  Postural Changes and/or Swallow Maneuvers: Seated upright 90 degrees;Upright 30-60 min after meal    Discharge Condition: Fair  Diet recommendation: Dysphagia 3 with thin liquids as above  Filed Weights   06/07/13 1533 06/09/13 0551  Weight: 71.2 kg (156 lb 15.5 oz) 69.8 kg (153 lb 14.1 oz)    History of present illness:  Thomas Raymond is a 68 y.o. [place] male with Past medical history of hypertension dyslipidemia ALS-prior history PEG tube, trach placement 2001. Admitted 06/07/13 with complaints of cough and shortness of breathInitially the cough was which greenish expectoration but right now he has dry cough. He denies taking any over-the-counter medications for the cough. He had fever and chills as well.  Please see below   Hospital Course:  Acute hypoxic respiratory failure due to CAP (community acquired pneumonia)- - cont levofloxacin day 2-start date 06/14/13 - BCx NGTD  - S. pneumo neg  - Legionella neg  - Flu PCR neg  - Speech therapy consulted recommendations as  above d  Elevated lactic acid suggestive of dehydration  -Saline locked fluid on discharge Chest pain, ECG stable. Likely related to pneumonia  - D-dimer below threshold of 0.68 given patient's age. No need for CTa at this time  - Telemetry: Sinus brady  - Troponin neg  HTN, elevated BP likely secondary to stress and pain and resolved quickly - Continue atenolol, benazeprim, norvasc  - Was on prn IV hydralazine  -Will need Chem-7/Chem-12 in about one week ALS and uses wheelchair for ambulation  Chronic pain in legs  - Venous duplex neg for DVT  Leukocytosis, stable, repeat in the morning  Normocytic anemia, likely marrow suppression from acute illness, repeat tomorrow      Procedures:  Modified barium swallow 2/18 (i.e. Studies not automatically included, echos, thoracentesis, etc; not x-rays)  Consultations:  Speech therapy  Discharge Exam: Filed Vitals:   06/09/13 0551  BP: 149/73  Pulse: 53  Temp: 97.5 F (36.4 C)  Resp: 20   Alert pleasant past irregular neck, no dysuria Past stool this morning decline wife at bedside  General: EOMI, NCAT Cardiovascular: S1-S2 no murmur rub or gallop Respiratory: Clinically clear  Discharge Instructions  Discharge Orders   Future Appointments Provider Department Dept Phone   08/24/2013 10:30 AM Corwin LevinsJames W [name], MD [facility] [phone]   Future Orders Complete By Expires   Diet - low sodium heart healthy  As directed    Discharge instructions  As directed    Comments:     Please complete full course of antibiotic Levaquin for another 5 tablets He will need to be careful with swallowing and eating because  of his ALS Continue all medications otherwise Please see Dr. Jonny Ruiz in about one week   Increase activity slowly  As directed        Medication List    STOP taking these medications       magic mouthwash w/lidocaine Soln      TAKE these medications       amLODipine-benazepril 10-40 MG per  capsule  Commonly known as:  LOTREL  Take 1 capsule by mouth daily.     atenolol 50 MG tablet  Commonly known as:  TENORMIN  TAKE 1 TABLET BY MOUTH EVERY DAY AT BEDTIME     cetirizine 10 MG tablet  Commonly known as:  ZYRTEC  TAKE 1 TABLET BY MOUTH EVERY DAY     levofloxacin 750 MG tablet  Commonly known as:  LEVAQUIN  Take 1 tablet (750 mg total) by mouth daily.     meclizine 25 MG tablet  Commonly known as:  ANTIVERT  Take 1 tablet (25 mg total) by mouth 3 (three) times daily as needed for dizziness or nausea.     omeprazole 20 MG capsule  Commonly known as:  PRILOSEC  TAKE ONE CAPSULE BY MOUTH EVERY DAY       No Known Allergies    The results of significant diagnostics from this hospitalization (including imaging, microbiology, ancillary and laboratory) are listed below for reference.    Significant Diagnostic Studies: Dg Chest Port 1 View  06/07/2013   CLINICAL DATA:  Shortness of breath, cough.  EXAM: PORTABLE CHEST - 1 VIEW  COMPARISON:  01/21/2011  FINDINGS: Slight elevation of the right hemidiaphragm. Patchy bilateral lower lobe airspace opacities. This could represent atelectasis or pneumonia. Mild cardiomegaly. No effusions. No acute bony abnormality.  IMPRESSION: Patchy bilateral lower lobe opacities, atelectasis versus pneumonia.   Electronically Signed   By: Charlett Nose M.D.   On: 06/07/2013 04:56   Dg Swallowing Func-speech Pathology  06/09/2013   Chales Abrahams, CCC-SLP     06/09/2013 10:24 AM Objective Swallowing Evaluation: Modified Barium Swallowing Study   Patient Details  Name: Thomas Raymond MRN: [id number] Date of Birth: 05/24/1945  Today's Date: 06/09/2013 Time: 7829-5621 SLP Time Calculation (min): 30 min  Past Medical History:  Past Medical History  Diagnosis Date  . ALLERGIC RHINITIS   . ALS   . HYPERLIPIDEMIA   . HYPERTENSION   . VERTIGO   . LOW BACK PAIN   . Impaired glucose tolerance 02/17/2011   Past Surgical History:  Past Surgical History  Procedure  Laterality Date  . Lipoma removal  2003  . Tracheostomy    . Percutaneous gastrostomy tube repalcement      04/1999 and removed 4/20068   HPI:  68 year old male admitted 06/07/13 due to cough and SOB.  PMH  significant for HTN, dyslipidemia, dysphagia s/p PEG tube/trach  placement removal in 2001.  Pt CXR 06/07/13 showed patchy  bilateral lower lobe opacities.  MBS x3 in 2001 (january,  february, and april).  Bedside evaluation of swallow completed  with recommendation for MBS due to h/o dysphagia and neurological  diagnosis.       Assessment / Plan / Recommendation Clinical Impression  Dysphagia Diagnosis: Moderate oral phase dysphagia;Moderate  pharyngeal phase dysphagia;Moderate cervical esophageal phase  dysphagia  Clinical impression: Moderate oropharyngeal and cervical  esophageal dysphagia for which pt has been compensating by a  variety of techniques.  No aspiration was observed but laryngeal  penetration of liquids noted - worse with  thin than nectar.   Extended breath hold noted with liquids - trace penetration even  with breath hold with pt producing reflexive throat clearing.  Pt  states he frequently clears his throat with liquids.    Muscular weakness results in pharyngeal stasis - worse with  increased viscocity - with impaired sensation to moderate  amounts.  Pt required 5-6 swallows to decrease pharyngeal stasis  to mild amounts- which can be laborious for pt.  Residuals due to  his oral propulsion, tongue base retraction and pharyngeal  contraction weakness.    Fortunately pt able to "hock" and expectorate 90% of residuals  from vallecular space that he was unable to swallow. Barium  residuals mixed with secretions.  Chin tuck posture attempted  without benefit.  SLP did not test barium tablet due to very poor  UES opening.   Pt will be chronic aspiration risk due to his level of dysphagia,  but he states he is weak currently and indicates his swallow will  imrprove with medical improvement.  Pt  further states he is not  losing weight.  Rec continue diet with very strict precautions.   Advised pt family should know heimlich manuever due to his level  of dysphagia.  Given pt diagnosis of ALS and level of dysphagia -  if he becomes severely ill, deconditioned, his dysphagia will  likely be unmanageable.  Pt educated throughout testing using  video for feedback.     Treatment Recommendation  Therapy as outlined in treatment plan below    Diet Recommendation Dysphagia 3 (Mechanical Soft);Thin liquid   Liquid Administration via: Cup Medication Administration: Crushed with puree (crush if not  contraindicated, follow with liquids) Supervision: Patient able to self feed Compensations: Slow rate;Small sips/bites;Multiple dry swallows  after each bite/sip;Follow solids with liquid ("hock to clear  throat at end of po intakel") Postural Changes and/or Swallow Maneuvers: Seated upright 90  degrees;Upright 30-60 min after meal    Other  Recommendations   N/A  Follow Up Recommendations    N/a   Frequency and Duration min 1 x/week  1 week   Pertinent Vitals/Pain Afebrile, decreased        General HPI: 68 year old male admitted 06/07/13 due to cough and  SOB.  PMH significant for HTN, dyslipidemia, dysphagia s/p PEG  tube/trach placement removal in 2001.  Pt CXR 06/07/13 showed  patchy bilateral lower lobe opacities.  MBS x3 in 2001 (january,  february, and april).  Bedside evaluation of swallow completed  with recommendation for MBS due to h/o dysphagia and neurological  diagnosis.   Type of Study: Modified Barium Swallowing Study Previous Swallow Assessment: MBS x3 in 2001 - flash penetration  of thin and thick liquids, vallecular sinus residue.   incoordination with swallow Diet Prior to this Study: Regular;Thin liquids Temperature Spikes Noted: No Respiratory Status: Room air History of Recent Intubation: No Behavior/Cognition: Alert;Cooperative;Pleasant mood Oral Cavity - Dentition: Adequate natural dentition Oral  Motor / Sensory Function: Within functional limits Self-Feeding Abilities: Able to feed self Patient Positioning: Upright in chair Baseline Vocal Quality: Clear Volitional Cough: Strong Volitional Swallow: Able to elicit    Reason for Referral   concern for aspiration  Oral Phase Oral Preparation/Oral Phase Oral Phase: Impaired Oral - Nectar Oral - Nectar Cup: Weak lingual manipulation;Reduced posterior  propulsion Oral - Thin Oral - Thin Teaspoon: Reduced posterior propulsion;Weak lingual  manipulation Oral - Thin Cup: Reduced posterior propulsion;Weak lingual  manipulation Oral - Solids Oral - Puree:  Reduced posterior propulsion;Weak lingual  manipulation Oral - Mechanical Soft: Impaired mastication;Reduced posterior  propulsion;Weak lingual manipulation   Pharyngeal Phase Pharyngeal Phase Pharyngeal Phase: Impaired Pharyngeal - Nectar Pharyngeal - Nectar Teaspoon: Reduced pharyngeal  peristalsis;Reduced epiglottic inversion;Reduced anterior  laryngeal mobility;Reduced laryngeal elevation;Reduced  airway/laryngeal closure;Reduced tongue base  retraction;Pharyngeal residue - pyriform sinuses;Pharyngeal  residue - valleculae;Pharyngeal residue - cp segment Pharyngeal - Nectar Cup: Reduced pharyngeal peristalsis;Reduced  epiglottic inversion;Reduced anterior laryngeal mobility;Reduced  laryngeal elevation;Reduced airway/laryngeal closure;Reduced  tongue base retraction;Pharyngeal residue - pyriform  sinuses;Pharyngeal residue - valleculae Pharyngeal - Thin Pharyngeal - Thin Teaspoon: Reduced pharyngeal  peristalsis;Reduced laryngeal elevation;Reduced anterior  laryngeal mobility;Reduced epiglottic inversion;Reduced  airway/laryngeal closure;Reduced tongue base  retraction;Penetration/Aspiration during swallow;Pharyngeal  residue - cp segment;Pharyngeal residue - pyriform  sinuses;Pharyngeal residue - valleculae Penetration/Aspiration details (thin teaspoon): Material enters  airway, remains ABOVE vocal cords and not  ejected out Pharyngeal - Thin Cup: Reduced laryngeal elevation;Reduced  pharyngeal peristalsis;Premature spillage to pyriform  sinuses;Premature spillage to valleculae;Reduced epiglottic  inversion;Reduced airway/laryngeal closure;Reduced tongue base  retraction;Reduced anterior laryngeal  mobility;Penetration/Aspiration during swallow;Pharyngeal residue  - valleculae;Pharyngeal residue - pyriform sinuses;Pharyngeal  residue - cp segment Penetration/Aspiration details (thin cup): Material enters  airway, remains ABOVE vocal cords and not ejected out Pharyngeal - Solids Pharyngeal - Puree: Reduced pharyngeal peristalsis;Reduced  epiglottic inversion;Reduced anterior laryngeal mobility;Reduced  laryngeal elevation;Reduced airway/laryngeal closure;Reduced  tongue base retraction;Pharyngeal residue - valleculae Pharyngeal - Mechanical Soft: Reduced tongue base  retraction;Reduced laryngeal elevation;Reduced anterior laryngeal  mobility;Reduced epiglottic inversion;Premature spillage to  valleculae;Reduced pharyngeal peristalsis;Reduced  airway/laryngeal closure;Pharyngeal residue - pyriform  sinuses;Pharyngeal residue - valleculae Pharyngeal Phase - Comment Pharyngeal Comment: pt with extended breath hold with liquids,  multiple swallows across each consistency, pt able to hock and  expectorate vallecular residuals  Cervical Esophageal Phase    GO    Cervical Esophageal Phase Cervical Esophageal Phase: Impaired Cervical Esophageal Phase - Nectar Nectar Teaspoon: Reduced cricopharyngeal relaxation Nectar Cup: Reduced cricopharyngeal relaxation Cervical Esophageal Phase - Thin Thin Teaspoon: Reduced cricopharyngeal relaxation Thin Cup: Reduced cricopharyngeal relaxation Cervical Esophageal Phase - Solids Puree: Reduced cricopharyngeal relaxation Mechanical Soft: Reduced cricopharyngeal relaxation         Donavan Burnet, MS [facility] SLP [phone] Please note, pt appears with excessive lingual movement also ?  Twitching of left  upper lip before testing.      Microbiology: Recent Results (from the past 240 hour(s))  CULTURE, BLOOD (ROUTINE X 2)     Status: None   Collection Time    06/07/13  4:47 AM      Result Value Ref Range Status   Specimen Description BLOOD LEFT ANTECUBITAL   Final   Special Requests BOTTLES DRAWN AEROBIC AND ANAEROBIC   Final   Culture  Setup Time     Final   Value: 06/07/2013 08:35     Performed at [facility]   Culture     Final   Value:        BLOOD CULTURE RECEIVED NO GROWTH TO DATE CULTURE WILL BE HELD FOR 5 DAYS BEFORE ISSUING A FINAL NEGATIVE REPORT     Performed at [facility]   Report Status PENDING   Incomplete  CULTURE, BLOOD (ROUTINE X 2)     Status: None   Collection Time    06/07/13  5:49 AM      Result Value Ref Range Status   Specimen Description BLOOD RIGHT ANTECUBITAL   Final   Special Requests BOTTLES DRAWN AEROBIC AND ANAEROBIC  Final   Culture  Setup Time     Final   Value: 06/07/2013 08:35     Performed at [facility]   Culture     Final   Value:        BLOOD CULTURE RECEIVED NO GROWTH TO DATE CULTURE WILL BE HELD FOR 5 DAYS BEFORE ISSUING A FINAL NEGATIVE REPORT     Performed at [facility]   Report Status PENDING   Incomplete     Labs: Basic Metabolic Panel:  Recent Labs Lab 06/07/13 0447 06/08/13 0420 06/09/13 0426  NA 141 142 140  K 3.7 4.1 3.9  CL 102 108 104  CO2 23 26 26   GLUCOSE 133* 147* 101*  BUN 11 12 15   CREATININE 0.33* 0.42* 0.36*  CALCIUM 8.9 8.7 9.0   Liver Function Tests:  Recent Labs Lab 06/07/13 0447  AST 28  ALT 23  ALKPHOS 90  BILITOT 0.6  PROT 8.1  ALBUMIN 3.9   No results found for this basename: LIPASE, AMYLASE,  in the last 168 hours No results found for this basename: AMMONIA,  in the last 168 hours CBC:  Recent Labs Lab 06/07/13 0447 06/08/13 0420 06/09/13 0426  WBC 15.0* 15.3* 12.3*  NEUTROABS 12.9*  --   --   HGB 14.3 12.2* 12.8*  HCT 42.4 37.0* 38.8*   MCV 87.8 88.7 89.0  PLT 251 226 264   Cardiac Enzymes:  Recent Labs Lab 06/07/13 0447  TROPONINI <0.30   BNP: BNP (last 3 results)  Recent Labs  06/07/13 0447  PROBNP 126.8*   CBG: No results found for this basename: GLUCAP,  in the last 168 hours     Signed:  Rhetta Mura  [facility] 06/09/2013, 10:43 AM

## 2013-06-09 NOTE — Progress Notes (Signed)
Speech Language Pathology Treatment: Dysphagia  Patient Details Name: Thomas Raymond MRN: [id number] DOB: [birth year] Today's Date: 06/09/2013 Time: 5397-6734 SLP Time Calculation (min): 8 min  Assessment / Plan / Recommendation Clinical Impression  Pt and spouse *who was not present during mbs* educated to findings of mbs, recommendation for compensation strategies to mitigate aspiration.  Using diagram, slp reviewed mbs.  Provided written instructions to pt (per his request as his daughter reads english and he states she can help him).    Advised pt that given his ALS and level of dysphagia, if he becomes severely deconditioned, his compensation for dysphagia may be difficult to achieve.  Advised pt/spouse to water being ph neutral and solid food aspiration being more caustic/dangerous to airway.  Provided heimlich maneuver written instructions and slp phone number if needed.  As pt has been managing his dysphagia and has maintained weight, do not recommend follow up at this time.  Recommend monitor closely in future and refer to SLP for repeat MBS if indicated.   Pt verbalized understanding to information and using teach back clearly understood 3 main points.     HPI HPI: 68 year old male admitted 06/07/13 due to cough and SOB.  PMH significant for HTN, dyslipidemia, dysphagia s/p PEG tube/trach placement removal in 2001.  Pt CXR 06/07/13 showed patchy bilateral lower lobe opacities.  MBS x3 in 2001 (january, february, and april).  Bedside evaluation of swallow completed with recommendation for MBS due to h/o dysphagia and neurological diagnosis.  MBS completed today, follow up to educate pt and spouse to recommendations given plan for dc today.    Pertinent Vitals Afebrile, decreased  SLP Plan  All goals met    Recommendations Diet recommendations: Dysphagia 3 (mechanical soft);Thin liquid Medication Administration: Crushed with puree Supervision: Patient able to self feed Compensations: Slow  rate;Small sips/bites;Multiple dry swallows after each bite/sip (hock after intake to clear pharynx) Postural Changes and/or Swallow Maneuvers: Seated upright 90 degrees;Upright 30-60 min after meal              Oral Care Recommendations: Oral care BID Follow up Recommendations: None Plan: All goals met    [place], Ina [facility] [place]

## 2013-06-09 NOTE — Procedures (Addendum)
Objective Swallowing Evaluation: Modified Barium Swallowing Study  Patient Details  Name: Thomas Raymond MRN: [id number] Date of Birth: Mar 18, 1946  Today's Date: 06/09/2013 Time: 4098-1191 SLP Time Calculation (min): 30 min  Past Medical History:  Past Medical History  Diagnosis Date  . ALLERGIC RHINITIS   . ALS   . HYPERLIPIDEMIA   . HYPERTENSION   . VERTIGO   . LOW BACK PAIN   . Impaired glucose tolerance 02/17/2011   Past Surgical History:  Past Surgical History  Procedure Laterality Date  . Lipoma removal  2003  . Tracheostomy    . Percutaneous gastrostomy tube repalcement      04/1999 and removed 4/20047   HPI:  68 year old male admitted 06/07/13 due to cough and SOB.  PMH significant for HTN, dyslipidemia, dysphagia s/p PEG tube/trach placement removal in 2001.  Pt CXR 06/07/13 showed patchy bilateral lower lobe opacities.  MBS x3 in 2001 (january, february, and april).  Bedside evaluation of swallow completed with recommendation for MBS due to h/o dysphagia and neurological diagnosis.       Assessment / Plan / Recommendation Clinical Impression  Dysphagia Diagnosis: Moderate oral phase dysphagia;Moderate pharyngeal phase dysphagia;Moderate cervical esophageal phase dysphagia  Clinical impression: Moderate oropharyngeal and cervical esophageal dysphagia for which pt has been compensating by a variety of techniques.  No aspiration was observed but laryngeal penetration of liquids noted - worse with thin than nectar.  Extended breath hold noted with liquids - trace penetration even with breath hold with pt producing reflexive throat clearing.  Pt states he frequently clears his throat with liquids.    Muscular weakness results in pharyngeal stasis - worse with increased viscocity - with impaired sensation to moderate amounts.  Pt required 5-6 swallows to decrease pharyngeal stasis to mild amounts- which can be laborious for pt.  Residuals due to his oral propulsion, tongue base  retraction and pharyngeal contraction weakness.    Fortunately pt able to "hock" and expectorate 90% of residuals from vallecular space that he was unable to swallow. Barium residuals mixed with secretions.  Chin tuck posture attempted without benefit.  SLP did not test barium tablet due to very poor UES opening.   Pt will be chronic aspiration risk due to his level of dysphagia, but he states he is weak currently and indicates his swallow will imrprove with medical improvement.  Pt further states he is not losing weight.  Rec continue diet with very strict precautions.  Advised pt family should know heimlich manuever due to his level of dysphagia.  Given pt diagnosis of ALS and level of dysphagia - if he becomes severely ill, deconditioned, his dysphagia will likely be unmanageable.  Pt educated throughout testing using video for feedback.     Treatment Recommendation  Therapy as outlined in treatment plan below    Diet Recommendation Dysphagia 3 (Mechanical Soft);Thin liquid   Liquid Administration via: Cup Medication Administration: Crushed with puree (crush if not contraindicated, follow with liquids) Supervision: Patient able to self feed Compensations: Slow rate;Small sips/bites;Multiple dry swallows after each bite/sip;Follow solids with liquid ("hock to clear throat at end of po intakel") Postural Changes and/or Swallow Maneuvers: Seated upright 90 degrees;Upright 30-60 min after meal    Other  Recommendations   N/A  Follow Up Recommendations    N/a   Frequency and Duration min 1 x/week  1 week   Pertinent Vitals/Pain Afebrile, decreased        General HPI: 68 year old male admitted 06/07/13 due  to cough and SOB.  PMH significant for HTN, dyslipidemia, dysphagia s/p PEG tube/trach placement removal in 2001.  Pt CXR 06/07/13 showed patchy bilateral lower lobe opacities.  MBS x3 in 2001 (january, february, and april).  Bedside evaluation of swallow completed with recommendation for  MBS due to h/o dysphagia and neurological diagnosis.   Type of Study: Modified Barium Swallowing Study Previous Swallow Assessment: MBS x3 in 2001 - flash penetration of thin and thick liquids, vallecular sinus residue.  incoordination with swallow Diet Prior to this Study: Regular;Thin liquids Temperature Spikes Noted: No Respiratory Status: Room air History of Recent Intubation: No Behavior/Cognition: Alert;Cooperative;Pleasant mood Oral Cavity - Dentition: Adequate natural dentition Oral Motor / Sensory Function: Within functional limits Self-Feeding Abilities: Able to feed self Patient Positioning: Upright in chair Baseline Vocal Quality: Clear Volitional Cough: Strong Volitional Swallow: Able to elicit    Reason for Referral   concern for aspiration  Oral Phase Oral Preparation/Oral Phase Oral Phase: Impaired Oral - Nectar Oral - Nectar Cup: Weak lingual manipulation;Reduced posterior propulsion Oral - Thin Oral - Thin Teaspoon: Reduced posterior propulsion;Weak lingual manipulation Oral - Thin Cup: Reduced posterior propulsion;Weak lingual manipulation Oral - Solids Oral - Puree: Reduced posterior propulsion;Weak lingual manipulation Oral - Mechanical Soft: Impaired mastication;Reduced posterior propulsion;Weak lingual manipulation   Pharyngeal Phase Pharyngeal Phase Pharyngeal Phase: Impaired Pharyngeal - Nectar Pharyngeal - Nectar Teaspoon: Reduced pharyngeal peristalsis;Reduced epiglottic inversion;Reduced anterior laryngeal mobility;Reduced laryngeal elevation;Reduced airway/laryngeal closure;Reduced tongue base retraction;Pharyngeal residue - pyriform sinuses;Pharyngeal residue - valleculae;Pharyngeal residue - cp segment Pharyngeal - Nectar Cup: Reduced pharyngeal peristalsis;Reduced epiglottic inversion;Reduced anterior laryngeal mobility;Reduced laryngeal elevation;Reduced airway/laryngeal closure;Reduced tongue base retraction;Pharyngeal residue - pyriform  sinuses;Pharyngeal residue - valleculae Pharyngeal - Thin Pharyngeal - Thin Teaspoon: Reduced pharyngeal peristalsis;Reduced laryngeal elevation;Reduced anterior laryngeal mobility;Reduced epiglottic inversion;Reduced airway/laryngeal closure;Reduced tongue base retraction;Penetration/Aspiration during swallow;Pharyngeal residue - cp segment;Pharyngeal residue - pyriform sinuses;Pharyngeal residue - valleculae Penetration/Aspiration details (thin teaspoon): Material enters airway, remains ABOVE vocal cords and not ejected out Pharyngeal - Thin Cup: Reduced laryngeal elevation;Reduced pharyngeal peristalsis;Premature spillage to pyriform sinuses;Premature spillage to valleculae;Reduced epiglottic inversion;Reduced airway/laryngeal closure;Reduced tongue base retraction;Reduced anterior laryngeal mobility;Penetration/Aspiration during swallow;Pharyngeal residue - valleculae;Pharyngeal residue - pyriform sinuses;Pharyngeal residue - cp segment Penetration/Aspiration details (thin cup): Material enters airway, remains ABOVE vocal cords and not ejected out Pharyngeal - Solids Pharyngeal - Puree: Reduced pharyngeal peristalsis;Reduced epiglottic inversion;Reduced anterior laryngeal mobility;Reduced laryngeal elevation;Reduced airway/laryngeal closure;Reduced tongue base retraction;Pharyngeal residue - valleculae Pharyngeal - Mechanical Soft: Reduced tongue base retraction;Reduced laryngeal elevation;Reduced anterior laryngeal mobility;Reduced epiglottic inversion;Premature spillage to valleculae;Reduced pharyngeal peristalsis;Reduced airway/laryngeal closure;Pharyngeal residue - pyriform sinuses;Pharyngeal residue - valleculae Pharyngeal Phase - Comment Pharyngeal Comment: pt with extended breath hold with liquids, multiple swallows across each consistency, pt able to hock and expectorate vallecular residuals  Cervical Esophageal Phase    GO    Cervical Esophageal Phase Cervical Esophageal Phase:  Impaired Cervical Esophageal Phase - Nectar Nectar Teaspoon: Reduced cricopharyngeal relaxation Nectar Cup: Reduced cricopharyngeal relaxation Cervical Esophageal Phase - Thin Thin Teaspoon: Reduced cricopharyngeal relaxation Thin Cup: Reduced cricopharyngeal relaxation Cervical Esophageal Phase - Solids Puree: Reduced cricopharyngeal relaxation Mechanical Soft: Reduced cricopharyngeal relaxation         Thomas Burnetamara Analycia Khokhar, MS [facility] SLP [phone] Please note, pt appears with excessive lingual movement also ? Twitching of left upper lip before testing.

## 2013-06-13 LAB — CULTURE, BLOOD (ROUTINE X 2)
Culture: NO GROWTH
Culture: NO GROWTH

## 2013-06-17 ENCOUNTER — Ambulatory Visit: Payer: Medicare Other | Admitting: Internal Medicine

## 2013-06-24 ENCOUNTER — Ambulatory Visit: Payer: Medicare Other | Admitting: Internal Medicine

## 2013-08-20 ENCOUNTER — Telehealth: Payer: Self-pay

## 2013-08-20 MED ORDER — BENZONATATE 200 MG PO CAPS: 200.0000 mg | ORAL_CAPSULE | Freq: Three times a day (TID) | ORAL | Status: DC | PRN

## 2013-08-20 NOTE — Telephone Encounter (Signed)
The patient called hoping to get something called in for a cough.  He is coming in Tuesday (with an interpreter) but is hoping to have something for the weekend to soothe his cough.   Thanks!

## 2013-08-20 NOTE — Telephone Encounter (Signed)
Patient informed. 

## 2013-08-20 NOTE — Telephone Encounter (Signed)
Done erx 

## 2013-08-24 ENCOUNTER — Ambulatory Visit: Payer: Medicare Other | Admitting: Internal Medicine

## 2013-08-27 DIAGNOSIS — IMO0002 Reserved for concepts with insufficient information to code with codable children: Secondary | ICD-10-CM

## 2013-08-27 DIAGNOSIS — G7089 Other specified myoneural disorders: Secondary | ICD-10-CM | POA: Diagnosis not present

## 2013-08-27 DIAGNOSIS — R32 Unspecified urinary incontinence: Secondary | ICD-10-CM

## 2013-08-27 DIAGNOSIS — R159 Full incontinence of feces: Secondary | ICD-10-CM | POA: Diagnosis not present

## 2013-08-27 DIAGNOSIS — I10 Essential (primary) hypertension: Secondary | ICD-10-CM | POA: Diagnosis not present

## 2013-08-30 ENCOUNTER — Other Ambulatory Visit: Payer: Self-pay | Admitting: Internal Medicine

## 2013-10-01 ENCOUNTER — Other Ambulatory Visit: Payer: Self-pay | Admitting: Internal Medicine

## 2013-10-01 DIAGNOSIS — R32 Unspecified urinary incontinence: Secondary | ICD-10-CM | POA: Diagnosis not present

## 2013-10-01 DIAGNOSIS — R159 Full incontinence of feces: Secondary | ICD-10-CM | POA: Diagnosis not present

## 2013-10-01 DIAGNOSIS — IMO0002 Reserved for concepts with insufficient information to code with codable children: Secondary | ICD-10-CM | POA: Diagnosis not present

## 2013-10-01 DIAGNOSIS — G7089 Other specified myoneural disorders: Secondary | ICD-10-CM | POA: Diagnosis not present

## 2013-10-01 DIAGNOSIS — M549 Dorsalgia, unspecified: Secondary | ICD-10-CM

## 2013-10-01 MED ORDER — ATENOLOL 50 MG PO TABS: ORAL_TABLET | ORAL | Status: DC

## 2013-10-01 NOTE — Addendum Note (Signed)
Addended by: Scharlene GlossEWING, Jacoby Ritsema B on: 10/01/2013 07:54 AM   Modules accepted: Orders

## 2013-10-02 ENCOUNTER — Other Ambulatory Visit: Payer: Self-pay | Admitting: Internal Medicine

## 2013-10-05 ENCOUNTER — Other Ambulatory Visit (INDEPENDENT_AMBULATORY_CARE_PROVIDER_SITE_OTHER): Payer: Medicare Other

## 2013-10-05 ENCOUNTER — Encounter: Payer: Self-pay | Admitting: Internal Medicine

## 2013-10-05 ENCOUNTER — Ambulatory Visit (INDEPENDENT_AMBULATORY_CARE_PROVIDER_SITE_OTHER): Payer: Medicare Other | Admitting: Internal Medicine

## 2013-10-05 VITALS — BP 130/78 | HR 60 | Temp 97.5°F | Wt 150.0 lb

## 2013-10-05 DIAGNOSIS — R7302 Impaired glucose tolerance (oral): Secondary | ICD-10-CM

## 2013-10-05 DIAGNOSIS — I10 Essential (primary) hypertension: Secondary | ICD-10-CM

## 2013-10-05 DIAGNOSIS — G1221 Amyotrophic lateral sclerosis: Secondary | ICD-10-CM

## 2013-10-05 DIAGNOSIS — E785 Hyperlipidemia, unspecified: Secondary | ICD-10-CM | POA: Diagnosis not present

## 2013-10-05 DIAGNOSIS — R7309 Other abnormal glucose: Secondary | ICD-10-CM | POA: Diagnosis not present

## 2013-10-05 DIAGNOSIS — Z0000 Encounter for general adult medical examination without abnormal findings: Secondary | ICD-10-CM

## 2013-10-05 LAB — HEPATIC FUNCTION PANEL
ALT: 57 U/L — AB (ref 0–53)
AST: 49 U/L — AB (ref 0–37)
Albumin: 4.4 g/dL (ref 3.5–5.2)
Alkaline Phosphatase: 74 U/L (ref 39–117)
BILIRUBIN TOTAL: 0.3 mg/dL (ref 0.2–1.2)
Bilirubin, Direct: 0.1 mg/dL (ref 0.0–0.3)
Total Protein: 8.3 g/dL (ref 6.0–8.3)

## 2013-10-05 LAB — LIPID PANEL
CHOLESTEROL: 145 mg/dL (ref 0–200)
HDL: 52.9 mg/dL (ref >39.00)
LDL CALC: 57 mg/dL (ref 0–99)
NonHDL: 92.1
Total CHOL/HDL Ratio: 3
Triglycerides: 176 mg/dL — ABNORMAL HIGH (ref 0.0–149.0)
VLDL: 35.2 mg/dL (ref 0.0–40.0)

## 2013-10-05 LAB — BASIC METABOLIC PANEL
BUN: 14 mg/dL (ref 6–23)
CHLORIDE: 101 meq/L (ref 96–112)
CO2: 29 mEq/L (ref 19–32)
Calcium: 9.8 mg/dL (ref 8.4–10.5)
Creatinine, Ser: 0.4 mg/dL (ref 0.4–1.5)
GFR: 226.74 mL/min (ref >60.00)
Glucose, Bld: 96 mg/dL (ref 70–99)
Potassium: 4.1 mEq/L (ref 3.5–5.1)
SODIUM: 137 meq/L (ref 135–145)

## 2013-10-05 LAB — HEMOGLOBIN A1C: Hgb A1c MFr Bld: 6.2 % (ref 4.6–6.5)

## 2013-10-05 MED ORDER — ROSUVASTATIN CALCIUM 20 MG PO TABS: 20.0000 mg | ORAL_TABLET | Freq: Every day | ORAL | Status: DC

## 2013-10-05 MED ORDER — CETIRIZINE HCL 10 MG PO TABS: 10.0000 mg | ORAL_TABLET | Freq: Every day | ORAL | Status: DC

## 2013-10-05 MED ORDER — ATENOLOL 50 MG PO TABS: ORAL_TABLET | ORAL | Status: DC

## 2013-10-05 MED ORDER — AMLODIPINE BESY-BENAZEPRIL HCL 10-40 MG PO CAPS
1.0000 | ORAL_CAPSULE | Freq: Every day | ORAL | Status: DC
Start: 2013-10-05 — End: 2014-09-26

## 2013-10-05 MED ORDER — OMEPRAZOLE 20 MG PO CPDR: 20.0000 mg | DELAYED_RELEASE_CAPSULE | Freq: Every day | ORAL | Status: DC

## 2013-10-05 NOTE — Assessment & Plan Note (Signed)
D/w pt, he is willing to see [place] [facility] - will refer

## 2013-10-05 NOTE — Progress Notes (Signed)
Pre visit review using our clinic review tool, if applicable. No additional management support is needed unless otherwise documented below in the visit note. 

## 2013-10-05 NOTE — Assessment & Plan Note (Signed)
stable overall by history and exam, recent data reviewed with pt, and pt to continue medical treatment as before,  to f/u any worsening symptoms or concerns Lab Results  Component Value Date   HGBA1C 6.1 02/23/2013
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
## 2013-10-05 NOTE — Assessment & Plan Note (Signed)
stable overall by history and exam, recent data reviewed with pt, and pt to continue medical treatment as before,  to f/u any worsening symptoms or concerns Lab Results  Component Value Date   LDLCALC 60 02/23/2013

## 2013-10-05 NOTE — Patient Instructions (Addendum)
Please continue all other medications as before, and refills have been done if requested.  Please have the pharmacy call with any other refills you may need.  Please continue your efforts at being more active, low cholesterol diet, and weight control.  You are otherwise up to date with prevention measures today.  Please keep your appointments with your specialists as you may have planned  Please go to the LAB in the Basement (turn left off the elevator) for the tests to be done today  You will be contacted by phone if any changes need to be made immediately.  Otherwise, you will receive a letter about your results with an explanation, but please check with MyChart first.  Please remember to sign up for MyChart if you have not done so, as this will be important to you in the future with finding out test results, communicating by private email, and scheduling acute appointments online when needed.  You will be contacted regarding the referral for: neurology, and colonoscopy  Please return in 6 months, or sooner if needed

## 2013-10-05 NOTE — Assessment & Plan Note (Signed)
stable overall by history and exam, recent data reviewed with pt, and pt to continue medical treatment as before,  to f/u any worsening symptoms or concerns BP Readings from Last 3 Encounters:  10/05/13 130/78  06/09/13 149/73  02/23/13 152/90

## 2013-10-06 ENCOUNTER — Encounter: Payer: Self-pay | Admitting: Internal Medicine

## 2013-10-06 ENCOUNTER — Telehealth: Payer: Self-pay | Admitting: Internal Medicine

## 2013-10-06 NOTE — Telephone Encounter (Signed)
Relevant patient education mailed to patient.  

## 2013-10-13 ENCOUNTER — Encounter: Payer: Self-pay | Admitting: Internal Medicine

## 2013-10-26 ENCOUNTER — Encounter: Payer: Self-pay | Admitting: Internal Medicine

## 2013-10-28 ENCOUNTER — Telehealth: Payer: Self-pay

## 2013-10-28 NOTE — Telephone Encounter (Signed)
Form arrived for MD to fill out.

## 2013-10-29 ENCOUNTER — Encounter: Payer: Self-pay | Admitting: Gastroenterology

## 2013-11-26 ENCOUNTER — Ambulatory Visit: Payer: Medicare Other | Admitting: Neurology

## 2013-12-01 ENCOUNTER — Ambulatory Visit (INDEPENDENT_AMBULATORY_CARE_PROVIDER_SITE_OTHER): Payer: Medicare Other | Admitting: Neurology

## 2013-12-01 ENCOUNTER — Encounter: Payer: Self-pay | Admitting: Neurology

## 2013-12-01 VITALS — BP 104/70 | HR 50 | Ht 69.0 in | Wt 145.0 lb

## 2013-12-01 DIAGNOSIS — G1220 Motor neuron disease, unspecified: Secondary | ICD-10-CM

## 2013-12-01 DIAGNOSIS — G1221 Amyotrophic lateral sclerosis: Secondary | ICD-10-CM | POA: Diagnosis not present

## 2013-12-01 LAB — CK: Total CK: 312 U/L — ABNORMAL HIGH (ref 7–232)

## 2013-12-01 MED ORDER — MELOXICAM 7.5 MG PO TABS: 7.5000 mg | ORAL_TABLET | Freq: Every day | ORAL | Status: DC | PRN

## 2013-12-01 NOTE — Patient Instructions (Addendum)
1.  Start taking Mobic 7.5 mg daily as needed for pain 2.  Check blood work today 3.  Return to clinic in 4-6 weeks

## 2013-12-01 NOTE — Progress Notes (Signed)
[facility] Neurology Division Clinic Note - Initial Visit   Date: 12/02/2013  Thomas Raymond MRN: [id number] DOB: Jun 15, 1945   Dear Dr Jonny Ruiz:  Thank you for your kind referral of [facility] for consultation of ALS. Although his history is well known to you, please allow [place] to reiterate it for the purpose of our medical record. The patient was accompanied to the clinic by wife and [name] interpreter who also provides collateral information.     History of Present Illness: Thomas Raymond is a 68 y.o. right-handed [name] male with history of hypertension, hyperlipidemia, motor neuron disease, and low back pain presenting for evaluation of progressive weakness of the arms and legs.    In 2000, he had severe pneumonia with respiratory failure requiring prolonged hospitalization during which time he had tracheostomy and PEG. He was noted to have severe dysphagia at that time and underwent electrodiagnostic testing which showed chronic motor neuropathy. Starting around 2002, he was having weakness of the arms and lower legs. He had continue to follow with Dr. Anne Hahn from October 2004 through August 2009 his symptoms. At some point, he was started on Rilutek and also underwent imaging of the lumbar spine which don't demonstrate any structural lesion. In November 2013, he saw Dr. Terrace Arabia who repeated EMG which again showed widespread chronic motor axon loss changes affecting the bulbar, cervical, thoracic, and lumbosacral regions consistent with motor neuron disease.   Some point, he has sought the opinion of other neurologists which has confirmed chronic motor neuron syndrome. He was also evaluated by Dr. Modesto Charon in 2012 evaluation was limited do to lack of medical interpreter at that visit.  His predominant symptoms include weakness of his arms and legs.  He apparently was doing fairly well from early 2000 - 2010, when weakness steadily worsened and he started having difficulty walking independently, so  started using a walker.  No problems with swallowing.  He has mild slurring of his speech since his tracheostomy but it has worsened over the past year.  He sleeps on one pillow and denies any shortness of breath.  He denies any muscle cramps or twitches.  He has achiness of his lower legs. He is mostly sedentary and walks to use to rest room only.  He is falling about once per month and he is unable to get up himself.  He is currently getting daily home health care. He has completed physical therapy and several times, no significant improvement.  Overall, he feels as if his symptoms have plateaued without worsening since 2013. His more active problem continues to be left leg discomfort, which per Dr. Anne Hahn is note, has been present since at least 2006. Left leg pain is described as achy involving his thighs and lower legs. It is improved by soft tissue massage. He has not noticed anything makes it worse. He has received epidural injections for his left leg pain which provided intermittent relief from 2006 - 2010.   Past Medical History  Diagnosis Date  . ALLERGIC RHINITIS   . ALS   . HYPERLIPIDEMIA   . HYPERTENSION   . VERTIGO   . LOW BACK PAIN   . Impaired glucose tolerance 02/17/2011    Past Surgical History  Procedure Laterality Date  . Lipoma removal  2003  . Tracheostomy    . Percutaneous gastrostomy tube repalcement      04/1999 and removed 07/1999     Medications:  Current Outpatient Prescriptions on File Prior to Visit  Medication Sig Dispense  Refill  . amLODipine-benazepril (LOTREL) 10-40 MG per capsule Take 1 capsule by mouth daily.  90 capsule  3  . atenolol (TENORMIN) 50 MG tablet TAKE 1 TABLET BY MOUTH EVERY DAY AT BEDTIME  90 tablet  3  . cetirizine (ZYRTEC) 10 MG tablet Take 1 tablet (10 mg total) by mouth daily.  90 tablet  3  . meclizine (ANTIVERT) 25 MG tablet TAKE 1 TABLET BY MOUTH 3 TIMES A DAY AS NEEDED FOR DIZZINESS OR NAUSEA  60 tablet  1  . omeprazole  (PRILOSEC) 20 MG capsule TAKE ONE CAPSULE BY MOUTH EVERY DAY  90 capsule  1  . rosuvastatin (CRESTOR) 20 MG tablet Take 1 tablet (20 mg total) by mouth daily.  90 tablet  3   No current facility-administered medications on file prior to visit.    Allergies: No Known Allergies  Family History: Family History  Problem Relation Age of Onset  . Other Mother     Deceased  . Other Father     Deceased  . Other Son 2    x 2 starvation/illness from [place]  . Other Daughter 2    x 2 starvation/illness from refugee camp    Social History: History   Social History  . Marital Status: Divorced    Spouse Name: N/A    Number of Children: N/A  . Years of Education: N/A   Occupational History  . Not on file.   Social History Main Topics  . Smoking status: Never Smoker   . Smokeless tobacco: Never Used  . Alcohol Use: No  . Drug Use: No  . Sexual Activity: No   Other Topics Concern  . Not on file   Social History Narrative   He lives with wife.   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  He went on disability in 2000 after diagnosis of ALS.   He moved from [place] in 1985.    Review of Systems:  CONSTITUTIONAL: No fevers, chills, night sweats, or weight loss.   EYES: No visual changes or eye pain ENT: No hearing changes.  No history of nose bleeds.   RESPIRATORY: No cough, wheezing and shortness of breath.   CARDIOVASCULAR: Negative for chest pain, and palpitations.   GI: Negative for abdominal discomfort, blood in stools or black stools.  No recent change in bowel habits.   GU:  No history of incontinence.   MUSCLOSKELETAL: +history of joint pain or swelling.  No myalgias.   SKIN: Negative for lesions, rash, and itching.   HEMATOLOGY/ONCOLOGY: Negative for prolonged bleeding, bruising easily, and swollen nodes.   ENDOCRINE: Negative for cold or heat intolerance, polydipsia or goiter.   PSYCH:  No depression or anxiety symptoms.   NEURO: As  Above.   Vital Signs:  BP 104/70  Pulse 50  Ht 5\' 9"  (1.753 m)  Wt 145 lb (65.772 kg)  BMI 21.40 kg/m2  SpO2 98%   General Medical Exam:   General:  Well appearing, comfortable. Sitting in wheelchair.   Eyes/ENT: see cranial nerve examination.   Neck: No masses appreciated.  Full range of motion without tenderness.  No carotid bruits. Respiratory:  Clear to auscultation, good air entry bilaterally.   Cardiac:  Regular rate and rhythm, no murmur.   Extremities:  No deformities, edema, or skin discoloration. Good capillary refill.   Skin:  Skin color, texture, turgor normal. No rashes or lesions.  Neurological Exam: MENTAL STATUS including orientation to time, place, person,  recent and remote memory, attention span and concentration, language, and fund of knowledge is normal.  Speech has mild dysarthric quality. He is able to perform lingual and guttural sounds without difficulty   CRANIAL NERVES: II:  No visual field defects.  Unremarkable fundi.   III-IV-VI: Pupils equal round and reactive to light.  Normal conjugate, extra-ocular eye movements in all directions of gaze.  No nystagmus.  No ptosis. V:  Normal facial sensation.  Jaw jerk is absent.   VII:  Normal facial symmetry.  Oribicularis oculi 5-/5, orbicularis oris 4+/5, buccinator 4+/5. No pathologic facial reflexes.  VIII:  Normal hearing and vestibular function.   IX-X:  Normal palatal movement.   XI:  Normal shoulder shrug and head rotation.   XII:  Tongue movements are moderately slowed and he is unable to completely tuck tongue into cheek against resistance.  Normal tongue strength and range of motion, no deviation or fasciculation.  MOTOR:  Moderate forearm and intrinsic hand atrophy.  No fasciculations, there are rare tremulous movements of the left face or left upper extremity.  No pronator drift.  Tone is reduced.   Neck flexion 4/5 Neck extension 4+/5 Right Upper Extremity:    Left Upper Extremity:    Deltoid  4/5    Deltoid  4/5   Biceps  4/5   Biceps  4/5   Triceps  4/5   Triceps  4/5   Wrist extensors  4/5   Wrist extensors  4/5   Wrist flexors  4/5   Wrist flexors  4/5   Finger extensors  4/5   Finger extensors  4/5   Finger flexors  4/5   Finger flexors  4/5   Dorsal interossei  4-/5   Dorsal interossei  4-/5   Abductor pollicis  4/5   Abductor pollicis  4/5   Tone (Ashworth scale)  0  Tone (Ashworth scale)  0   Right Lower Extremity:    Left Lower Extremity:    Hip flexors  4/5   Hip flexors  4/5   Hip extensors  4/5   Hip extensors  4/5   Knee flexors  4/5   Knee flexors  4/5   Knee extensors  4/5   Knee extensors  4/5   Dorsiflexors  4/5   Dorsiflexors  4/5   Plantarflexors  4/5   Plantarflexors  4/5   Toe extensors  3/5   Toe extensors  3/5   Toe flexors  3/5   Toe flexors  3/5   Tone (Ashworth scale)  0  Tone (Ashworth scale)  0   MSRs:  Right                                                                 Left brachioradialis 0  brachioradialis 0  biceps 0  biceps 0  triceps 0  triceps 0  patellar 0  patellar 0  ankle jerk 0  ankle jerk 0  Hoffman no  Hoffman no  plantar response down  plantar response down   SENSORY:  Normal and symmetric perception of light touch, pinprick, and vibration.  COORDINATION/GAIT: Normal finger-to- nose-finger. Finger tapping intact.   Gait not tested due to patients severity of weakness.  Data: MRI lumbar spine 10/03/2003: 1. Degenerative  disc disease at L1-2, L2-3, L3-4, and L4-5 with broad based degenerated bulging discs and osteophytic ridging. There are varying degrees of spinal, lateral recess, and foraminal stenosis as specifically described above.    Lab Results  Component Value Date   HGBA1C 6.2 10/05/2013   Lab Results  Component Value Date   TROPONINI <0.30 06/07/2013   Lab Results  Component Value Date   TSH 0.85 02/23/2013   EMG 03/02/2012: Electrodiagnostic evidence of a widespread chronic neurogenic changes involving the  bulbar, cervical, thoracic, and lumbosacral regions, consistent with the clinical diagnosis of motor neuron disease. There is also evidence of a mild length-dependent sensory predominant axonal peripheral neuropathy.  Chronic motor axon loss changes were not seen in any of the tested muscles.   IMPRESSION: Thomas Raymond is a 68 year old gentleman presenting for evaluation of progressive and symmetric muscular weakness of his arms and legs.  He was diagnosed with motor neuron disease in early 2000 and has subsequently had several EMGs which have all demonstrated chronic motor axon loss changes in the bulbar, cervical, thoracic, and lumbosacral regions without accompanied active changes. Imaging of his lumbar spine shows multilevel degenerative disc disease with broad-based disc bulges, which may be accounting for some of his left leg discomfort and pain.    Based on review of his prior studies, chronic and insidious clinical course, and absence of upper motor neuron findings on exam, he most likely has progressive muscular atrophy. Alternatively, low motor neuron predominant ALS is possible, however in the absence of active denervation on electrodiagnostic testing, it is unlikely.  In that his course has been relatively stable over the past 2 years, I do not feel inclined to repeat electrodiagnostic testing, unless there is abrupt worsening. I will check basic labs will his motor neuropathy, I may consider adding GM 1 antibody testing going forward.  I discussed referral for home PT and OT however patient has already completed it several times and has purchased durable medical equipment over the years. He is getting home health care aide who comes daily.    PLAN/RECOMMENDATIONS:  1.  Check CK, aldolase, copper, SPEP/UPEP with IFE, PTH 2.  For his achy leg discomfort, we will offer her a trial of Mobic 7.5 mg daily as needed for pain  3.  Return to clinic in 3-4 weeks to discuss and diagnosis    The  duration of this appointment visit was 70 minutes of face-to-face time with the patient.  Greater than 50% of this time was spent in counseling, explanation of diagnosis, planning of further management, and coordination of care.   Thank you for allowing me to participate in patient's care.  If I can answer any additional questions, I would be pleased to do so.    Sincerely,    Donika K. Allena Katz, DO

## 2013-12-02 LAB — PTH, INTACT AND CALCIUM
Calcium: 9.5 mg/dL (ref 8.4–10.5)
PTH: 54.2 pg/mL (ref 14.0–72.0)

## 2013-12-03 LAB — UIFE/LIGHT CHAINS/TP QN, 24-HR UR
ALPHA 1 UR: DETECTED — AB
Albumin, U: DETECTED
Alpha 2, Urine: DETECTED — AB
Beta, Urine: DETECTED — AB
FREE LAMBDA LT CHAINS, UR: 1.06 mg/dL — AB (ref 0.02–0.67)
Free Kappa Lt Chains,Ur: 7.33 mg/dL — ABNORMAL HIGH (ref 0.14–2.42)
Free Kappa/Lambda Ratio: 6.92 ratio (ref 2.04–10.37)
Gamma Globulin, Urine: DETECTED — AB
TOTAL PROTEIN, URINE-UPE24: 48.9 mg/dL

## 2013-12-03 LAB — SPEP & IFE WITH QIG
ALPHA-1-GLOBULIN: 0.3 % — AB (ref 2.9–4.9)
Albumin ELP: 52.7 % — ABNORMAL LOW (ref 55.8–66.1)
Alpha-2-Globulin: 13.8 % — ABNORMAL HIGH (ref 7.1–11.8)
Beta 2: 6.8 % — ABNORMAL HIGH (ref 3.2–6.5)
Beta Globulin: 6.4 % (ref 4.7–7.2)
Gamma Globulin: 20 % — ABNORMAL HIGH (ref 11.1–18.8)
IgA: 297 mg/dL (ref 68–379)
IgG (Immunoglobin G), Serum: 1700 mg/dL — ABNORMAL HIGH (ref 650–1600)
IgM, Serum: 44 mg/dL (ref 41–251)
TOTAL PROTEIN, SERUM ELECTROPHOR: 7.4 g/dL (ref 6.0–8.3)

## 2013-12-03 LAB — COPPER, SERUM: Copper: 84 ug/dL (ref 70–175)

## 2013-12-04 LAB — ALDOLASE: Aldolase: 7.1 U/L (ref <=8.1)

## 2013-12-10 DIAGNOSIS — IMO0002 Reserved for concepts with insufficient information to code with codable children: Secondary | ICD-10-CM | POA: Diagnosis not present

## 2013-12-10 DIAGNOSIS — R32 Unspecified urinary incontinence: Secondary | ICD-10-CM | POA: Diagnosis not present

## 2013-12-10 DIAGNOSIS — G7089 Other specified myoneural disorders: Secondary | ICD-10-CM | POA: Diagnosis not present

## 2013-12-10 DIAGNOSIS — M549 Dorsalgia, unspecified: Secondary | ICD-10-CM

## 2013-12-10 DIAGNOSIS — R159 Full incontinence of feces: Secondary | ICD-10-CM | POA: Diagnosis not present

## 2013-12-28 ENCOUNTER — Encounter: Payer: Medicare Other | Admitting: Gastroenterology

## 2014-01-14 ENCOUNTER — Ambulatory Visit (INDEPENDENT_AMBULATORY_CARE_PROVIDER_SITE_OTHER): Payer: Medicare Other | Admitting: Neurology

## 2014-01-14 ENCOUNTER — Encounter: Payer: Self-pay | Admitting: Neurology

## 2014-01-14 VITALS — BP 140/80 | HR 68 | Ht 69.0 in | Wt 156.0 lb

## 2014-01-14 DIAGNOSIS — G8929 Other chronic pain: Secondary | ICD-10-CM

## 2014-01-14 DIAGNOSIS — R5383 Other fatigue: Secondary | ICD-10-CM

## 2014-01-14 DIAGNOSIS — R5381 Other malaise: Secondary | ICD-10-CM

## 2014-01-14 DIAGNOSIS — M549 Dorsalgia, unspecified: Secondary | ICD-10-CM

## 2014-01-14 DIAGNOSIS — G1221 Amyotrophic lateral sclerosis: Secondary | ICD-10-CM

## 2014-01-14 DIAGNOSIS — M4310 Spondylolisthesis, site unspecified: Secondary | ICD-10-CM

## 2014-01-14 DIAGNOSIS — R531 Weakness: Secondary | ICD-10-CM

## 2014-01-14 DIAGNOSIS — M4306 Spondylolysis, lumbar region: Secondary | ICD-10-CM

## 2014-01-14 MED ORDER — PREGABALIN 75 MG PO CAPS: 75.0000 mg | ORAL_CAPSULE | Freq: Two times a day (BID) | ORAL | Status: DC

## 2014-01-14 MED ORDER — DICLOFENAC SODIUM 25 MG PO TBEC: 25.0000 mg | DELAYED_RELEASE_TABLET | Freq: Two times a day (BID) | ORAL | Status: DC | PRN

## 2014-01-14 MED ORDER — LIDOCAINE 5 % EX OINT: 1.0000 "application " | TOPICAL_OINTMENT | Freq: Two times a day (BID) | CUTANEOUS | Status: DC | PRN

## 2014-01-14 NOTE — Patient Instructions (Signed)
1.  Start taking diclofenac  twice daily as needed for pain. 2.  If no improvement after one week of starting diclofenac, you can start applying lidocaine ointment to low back.  Use gloves when applying. 3.  Samples provided for Lyrica  twice daily.  If this is helping, please call my office so I can call the prescription to your pharmacy 4.  Call me in the first week of November to let me know how your pain is doing.  If no improvement, will schedule for epidural injection 5.  Return to clinic in 78-months

## 2014-01-14 NOTE — Progress Notes (Signed)
Follow-up Visit   Date: 01/14/2014    Thomas Raymond MRN: [id number] DOB: February 20, 1946   Interim History: Thomas Raymond is a 68 y.o. right-handed [name] male with history of hypertension, hyperlipidemia, motor neuron disease, and low back pain returning to the clinic for follow-up of progressive weakness of the arms and legs.  The patient was accompanied to the clinic by wife and [name] interpretor who also provides collateral information.    History of present illness: In 2000, he had severe pneumonia with respiratory failure requiring prolonged hospitalization during which time he had tracheostomy and PEG. He was noted to have severe dysphagia at that time and underwent electrodiagnostic testing which showed chronic motor neuropathy. Starting around 2002, he was having weakness of the arms and lower legs. He had continue to follow with Dr. Anne Hahn from October 2004 through August 2009 his symptoms. At some point, he was started on Rilutek and also underwent imaging of the lumbar spine which don't demonstrate any structural lesion. In November 2013, he saw Dr. Terrace Arabia who repeated EMG which again showed widespread chronic motor axon loss changes affecting the bulbar, cervical, thoracic, and lumbosacral regions consistent with motor neuron disease.   Some point, he has sought the opinion of other neurologists which has confirmed chronic motor neuron syndrome. He was also evaluated by Dr. Modesto Charon in 2012 evaluation was limited do to lack of medical interpreter at that visit.  His predominant symptoms include weakness of his arms and legs. He apparently was doing fairly well from early 2000 - 2010, when weakness steadily worsened and he started having difficulty walking independently, so started using a walker. No problems with swallowing. He has mild slurring of his speech since his tracheostomy but it has worsened over the past year. He sleeps on one pillow and denies any shortness of breath.   He denies  any muscle cramps or twitches. He has achiness of his lower legs. He is mostly sedentary and walks to use to rest room only. He is falling about once per month and he is unable to get up himself. He is currently getting daily home health care. He has completed physical therapy and several times, no significant improvement.   Overall, he feels as if his symptoms have plateaued without worsening since 2013. His more active problem continues to be left leg discomfort, which per Dr. Anne Hahn is note, has been present since at least 2006. Left leg pain is described as achy involving his thighs and lower legs. It is improved by soft tissue massage. He has not noticed anything makes it worse. He has received epidural injections for his left leg pain which provided intermittent relief from 2006 - 2010.   UPDATE 01/14/2014:  Patient tried mobic, but there was no change in his pain and continues to be most bothered by it. In the past (2010) he had epidural injections, which helped and is interested in repeating it.  Denies any new weakness.   Medications:  Current Outpatient Prescriptions on File Prior to Visit  Medication Sig Dispense Refill  . amLODipine-benazepril (LOTREL) 10-40 MG per capsule Take 1 capsule by mouth daily.  90 capsule  3  . atenolol (TENORMIN) 50 MG tablet TAKE 1 TABLET BY MOUTH EVERY DAY AT BEDTIME  90 tablet  3  . cetirizine (ZYRTEC) 10 MG tablet Take 1 tablet (10 mg total) by mouth daily.  90 tablet  3  . meclizine (ANTIVERT) 25 MG tablet TAKE 1 TABLET BY MOUTH 3 TIMES A DAY  AS NEEDED FOR DIZZINESS OR NAUSEA  60 tablet  1  . omeprazole (PRILOSEC) 20 MG capsule TAKE ONE CAPSULE BY MOUTH EVERY DAY  90 capsule  1  . rosuvastatin (CRESTOR) 20 MG tablet Take 1 tablet (20 mg total) by mouth daily.  90 tablet  3   No current facility-administered medications on file prior to visit.    Allergies: No Known Allergies   Review of Systems:  CONSTITUTIONAL: No fevers, chills, night sweats, or  weight loss.   EYES: No visual changes or eye pain ENT: No hearing changes.  No history of nose bleeds.   RESPIRATORY: No cough, wheezing and shortness of breath.   CARDIOVASCULAR: Negative for chest pain, and palpitations.   GI: Negative for abdominal discomfort, blood in stools or black stools.  No recent change in bowel habits.   GU:  No history of incontinence.   MUSCLOSKELETAL: +history of joint pain or swelling.  No myalgias.   SKIN: Negative for lesions, rash, and itching.   ENDOCRINE: Negative for cold or heat intolerance, polydipsia or goiter.   PSYCH:  No depression or anxiety symptoms.   NEURO: As Above.   Vital Signs:  BP 140/80  Pulse 68  Ht  (1.753 m)  Wt 156 lb (70.761 kg)  BMI 23.03 kg/m2  SpO2 98%  Neurological Exam: MENTAL STATUS including orientation to time, place, person, recent and remote memory, attention span and concentration, language, and fund of knowledge is normal.  Speech with mild dysarthria.  CRANIAL NERVES:    Face is symmetric. Tongue movements are moderately slowed and he is unable to completely tuck tongue into cheek against resistance.   MOTOR:  Motor strength is 4/5 in all extremities, except 3/5 with toe flexion/extension.  Moderate forearm and intrinsic hand atrophy. No fasciculations, there are rare tremulous movements of the left face or left upper extremity. No pronator drift. Tone is reduced  MSRs:  Reflexes are absent throughout upper and lower extremities.    SENSORY:  Intact to light touch and vibration.  COORDINATION/GAIT:  Gait not tested, patient wheelchair bound.  Data: MRI lumbar spine 10/03/2003:  1. Degenerative disc disease at L1-2, L2-3, L3-4, and L4-5 with broad based degenerated bulging discs and osteophytic ridging. There are varying degrees of spinal, lateral recess, and foraminal stenosis   EMG 03/02/2012: Electrodiagnostic evidence of a widespread chronic neurogenic changes involving the bulbar, cervical,  thoracic, and lumbosacral regions, consistent with the clinical diagnosis of motor neuron disease. There is also evidence of a mild length-dependent sensory predominant axonal peripheral neuropathy. Chronic motor axon loss changes were not seen in any of the tested muscles.   Labs 12/01/2013:  CK, aldolase, copper, SPEP/UPEP with IFE, PTH -normal   IMPRESSION: Mr. Pellow is a 68 year old gentleman returning for evaluation of progressive and symmetric muscular weakness of his arms and legs and chronic left leg pain. He was diagnosed with motor neuron disease in early 2000 and has subsequently had several EMGs which have all demonstrated chronic motor axon loss changes in the bulbar, cervical, thoracic, and lumbosacral regions without accompanied active changes. Imaging of his lumbar spine shows multilevel degenerative disc disease with broad-based disc bulges, which may be accounting for some of his left leg discomfort and pain.   Based on review of his prior studies, chronic and insidious clinical course, and absence of upper motor neuron findings on exam, he most likely has progressive muscular atrophy. Alternatively, low motor neuron predominant ALS is possible, however in the absence  of active denervation on electrodiagnostic testing, it is unlikely. In that his course has been relatively stable over the past 2 years, I do not feel inclined to repeat electrodiagnostic testing, unless there is abrupt worsening.   At this visit, his ongoing problems is left leg discomfort which I suspect is due to disc herniation and degenerative changes of the lumbar spine.  He had epidural injections in 2010 with intermittent relief.  I will try oral medications for pain, but if no improvement, he may need another epidural injection.  Discussed repeat MRI lumbar spine, but patient declined.   PLAN/RECOMMENDATIONS:  1.  Start taking diclofenac  twice daily as needed for pain. 2.  If no improvement after one week  of starting diclofenac, try applying lidocaine ointment to low back.   3.  Samples provided for Lyrica  twice daily.  4.  Patient to call with update in early November, if no improvement, will schedule for epidural injection 5.  Return to clinic in 84-months    The duration of this appointment visit was 35 minutes of face-to-face time with the patient.  Greater than 50% of this time was spent in counseling, explanation of diagnosis, planning of further management, and coordination of care.   Thank you for allowing me to participate in patient's care.  If I can answer any additional questions, I would be pleased to do so.    Sincerely,    Donika K. Allena Katz, DO

## 2014-01-17 ENCOUNTER — Ambulatory Visit (INDEPENDENT_AMBULATORY_CARE_PROVIDER_SITE_OTHER): Payer: Medicare Other | Admitting: Nurse Practitioner

## 2014-01-17 ENCOUNTER — Encounter: Payer: Self-pay | Admitting: Nurse Practitioner

## 2014-01-17 VITALS — BP 160/62 | HR 60 | Ht 65.0 in | Wt 156.0 lb

## 2014-01-17 DIAGNOSIS — Z1211 Encounter for screening for malignant neoplasm of colon: Secondary | ICD-10-CM | POA: Diagnosis not present

## 2014-01-17 NOTE — Patient Instructions (Signed)
It has been recommended to you by your physician that you have a(n) colonoscopy completed. Per your request, we did not schedule the procedure(s) today. Please contact our office at [phone] should you decide to have the procedure completed.  If you develop any gastro problems please call our office. We will be glad to see you .

## 2014-01-18 ENCOUNTER — Encounter: Payer: Self-pay | Admitting: Nurse Practitioner

## 2014-01-18 DIAGNOSIS — Z1211 Encounter for screening for malignant neoplasm of colon: Secondary | ICD-10-CM | POA: Insufficient documentation

## 2014-01-18 NOTE — Progress Notes (Signed)
    HPI :  Patient is a 68 year old male from [place] who is remotely known to Dr. Russella DarStark for PEG placement many years ago. Patient is here with an interpreter.  He is referred by PCP for colon cancer screening.  Patient followed by Neurology for what is felt to be progressive muscular atrophy. His symptoms began in year 2000 but patient feels he has become progressively weaker over last couple of years. He fell twice last week. Patient does not want to pursue a screening colonoscopy and states he isn't having any bowel changes or blood in stool. He is swallowing okay.   Past Medical History  Diagnosis Date  . ALLERGIC RHINITIS   . ALS   . HYPERLIPIDEMIA   . HYPERTENSION   . VERTIGO   . LOW BACK PAIN   . Impaired glucose tolerance 02/17/2011    Family History  Problem Relation Age of Onset  . Other Mother     Deceased  . Other Father     Deceased  . Other Son 2    x 2 starvation/illness from [place]  . Other Daughter 2    x 2 starvation/illness from [place]   History  Substance Use Topics  . Smoking status: Never Smoker   . Smokeless tobacco: Never Used  . Alcohol Use: No   Current Outpatient Prescriptions  Medication Sig Dispense Refill  . amLODipine-benazepril (LOTREL) 10-40 MG per capsule Take 1 capsule by mouth daily.  90 capsule  3  . atenolol (TENORMIN) 50 MG tablet TAKE 1 TABLET BY MOUTH EVERY DAY AT BEDTIME  90 tablet  3  . cetirizine (ZYRTEC) 10 MG tablet Take 1 tablet (10 mg total) by mouth daily.  90 tablet  3  . diclofenac (VOLTAREN) 25 MG EC tablet Take 1 tablet (25 mg total) by mouth 2 (two) times daily as needed.  60 tablet  3  . meclizine (ANTIVERT) 25 MG tablet TAKE 1 TABLET BY MOUTH 3 TIMES A DAY AS NEEDED FOR DIZZINESS OR NAUSEA  60 tablet  1  . omeprazole (PRILOSEC) 20 MG capsule TAKE ONE CAPSULE BY MOUTH EVERY DAY  90 capsule  1  . pregabalin (LYRICA) 75 MG capsule Take 1 capsule (75 mg total) by mouth 2 (two) times daily.  28 capsule  0  .  rosuvastatin (CRESTOR) 20 MG tablet Take 1 tablet (20 mg total) by mouth daily.  90 tablet  3  . lidocaine (XYLOCAINE) 5 % ointment Apply 1 application topically 2 (two) times daily as needed. Use gloves when applying to low back.  50 g  3   No current facility-administered medications for this visit.   No Known Allergies  Physical Exam: BP 160/62  Pulse 60  Ht 5\' 5"  (1.651 m)  Wt 156 lb (70.761 kg)  BMI 25.96 kg/m2 Constitutional: Pleasant,well-developed, male in no acute distress. HEENT: Normocephalic and atraumatic. Left cheek twitching. Conjunctivae are normal. No scleral icterus.  ASSESSMENT AND PLAN:  331. 68 year old male from [place] ,here with interpreter,  And referred for colon cancer screening. Patient made it clear that he does not want a colonoscopy. He denies any gastrointestinal problems. Patient does understand that a colonoscopy would screen for colon cancer (though risk of colon cancer probably lower than that of Americans).  2 Progressive muscular weakness to upper and lower extremities. Followed by Neurology for what is felt to be progressive muscular atrophy vrs low motor neuron predominant ALS.

## 2014-01-18 NOTE — Progress Notes (Signed)
Reviewed and agree with management plan.  Zury Fazzino T. Kamalei Roeder, MD FACG 

## 2014-02-01 ENCOUNTER — Telehealth: Payer: Self-pay | Admitting: Neurology

## 2014-02-01 NOTE — Telephone Encounter (Signed)
Called patient back.  No answer and no voicemail. 

## 2014-02-01 NOTE — Telephone Encounter (Signed)
Pt called a 2nd time. He needs pain meds. Please call / Sherri S.

## 2014-02-01 NOTE — Telephone Encounter (Signed)
Pt called requesting to speak to a nurse regarding his meds.  C/b [phone]

## 2014-02-02 ENCOUNTER — Other Ambulatory Visit: Payer: Self-pay | Admitting: *Deleted

## 2014-02-02 MED ORDER — PREGABALIN 75 MG PO CAPS: 75.0000 mg | ORAL_CAPSULE | Freq: Two times a day (BID) | ORAL | Status: DC

## 2014-02-02 NOTE — Telephone Encounter (Signed)
Called patient back.  He is requesting refill on Lyrica.  Rx faxed to [facility].

## 2014-02-02 NOTE — Telephone Encounter (Signed)
Pt called you back please call [phone]

## 2014-02-07 ENCOUNTER — Other Ambulatory Visit: Payer: Self-pay

## 2014-02-07 ENCOUNTER — Emergency Department (HOSPITAL_COMMUNITY): Payer: Medicare Other

## 2014-02-07 ENCOUNTER — Inpatient Hospital Stay (HOSPITAL_COMMUNITY)
Admission: EM | Admit: 2014-02-07 | Discharge: 2014-02-10 | DRG: 481 | Disposition: A | Discharge status: Home Health | Payer: Medicare Other | Attending: Internal Medicine | Admitting: Internal Medicine

## 2014-02-07 ENCOUNTER — Encounter (HOSPITAL_COMMUNITY)
Admission: EM | Disposition: A | Discharge status: Home Health | Payer: Self-pay | Source: Home / Self Care | Attending: Internal Medicine

## 2014-02-07 ENCOUNTER — Inpatient Hospital Stay (HOSPITAL_COMMUNITY): Payer: Medicare Other | Admitting: Anesthesiology

## 2014-02-07 ENCOUNTER — Inpatient Hospital Stay (HOSPITAL_COMMUNITY): Payer: Medicare Other

## 2014-02-07 ENCOUNTER — Encounter (HOSPITAL_COMMUNITY): Payer: Medicare Other | Admitting: Anesthesiology

## 2014-02-07 ENCOUNTER — Encounter (HOSPITAL_COMMUNITY): Payer: Self-pay | Admitting: Emergency Medicine

## 2014-02-07 DIAGNOSIS — R1314 Dysphagia, pharyngoesophageal phase: Secondary | ICD-10-CM

## 2014-02-07 DIAGNOSIS — W1811XA Fall from or off toilet without subsequent striking against object, initial encounter: Secondary | ICD-10-CM | POA: Diagnosis present

## 2014-02-07 DIAGNOSIS — R030 Elevated blood-pressure reading, without diagnosis of hypertension: Secondary | ICD-10-CM | POA: Diagnosis not present

## 2014-02-07 DIAGNOSIS — I10 Essential (primary) hypertension: Secondary | ICD-10-CM | POA: Diagnosis present

## 2014-02-07 DIAGNOSIS — Z1211 Encounter for screening for malignant neoplasm of colon: Secondary | ICD-10-CM

## 2014-02-07 DIAGNOSIS — R1310 Dysphagia, unspecified: Secondary | ICD-10-CM | POA: Diagnosis present

## 2014-02-07 DIAGNOSIS — E785 Hyperlipidemia, unspecified: Secondary | ICD-10-CM | POA: Diagnosis present

## 2014-02-07 DIAGNOSIS — G8929 Other chronic pain: Secondary | ICD-10-CM | POA: Diagnosis present

## 2014-02-07 DIAGNOSIS — Z0000 Encounter for general adult medical examination without abnormal findings: Secondary | ICD-10-CM

## 2014-02-07 DIAGNOSIS — W19XXXA Unspecified fall, initial encounter: Secondary | ICD-10-CM | POA: Diagnosis not present

## 2014-02-07 DIAGNOSIS — S72132A Displaced apophyseal fracture of left femur, initial encounter for closed fracture: Secondary | ICD-10-CM | POA: Diagnosis not present

## 2014-02-07 DIAGNOSIS — R42 Dizziness and giddiness: Secondary | ICD-10-CM

## 2014-02-07 DIAGNOSIS — G1220 Motor neuron disease, unspecified: Secondary | ICD-10-CM

## 2014-02-07 DIAGNOSIS — S298XXA Other specified injuries of thorax, initial encounter: Secondary | ICD-10-CM | POA: Diagnosis not present

## 2014-02-07 DIAGNOSIS — Z23 Encounter for immunization: Secondary | ICD-10-CM

## 2014-02-07 DIAGNOSIS — Y92092 Bedroom in other non-institutional residence as the place of occurrence of the external cause: Secondary | ICD-10-CM | POA: Diagnosis not present

## 2014-02-07 DIAGNOSIS — M79673 Pain in unspecified foot: Secondary | ICD-10-CM | POA: Diagnosis not present

## 2014-02-07 DIAGNOSIS — S72092A Other fracture of head and neck of left femur, initial encounter for closed fracture: Principal | ICD-10-CM | POA: Diagnosis present

## 2014-02-07 DIAGNOSIS — S72002A Fracture of unspecified part of neck of left femur, initial encounter for closed fracture: Secondary | ICD-10-CM | POA: Diagnosis present

## 2014-02-07 DIAGNOSIS — G1221 Amyotrophic lateral sclerosis: Secondary | ICD-10-CM | POA: Diagnosis present

## 2014-02-07 DIAGNOSIS — M545 Low back pain: Secondary | ICD-10-CM | POA: Diagnosis not present

## 2014-02-07 DIAGNOSIS — M79606 Pain in leg, unspecified: Secondary | ICD-10-CM | POA: Diagnosis not present

## 2014-02-07 DIAGNOSIS — S3982XA Other specified injuries of lower back, initial encounter: Secondary | ICD-10-CM | POA: Diagnosis not present

## 2014-02-07 DIAGNOSIS — M21252 Flexion deformity, left hip: Secondary | ICD-10-CM | POA: Diagnosis not present

## 2014-02-07 DIAGNOSIS — R7302 Impaired glucose tolerance (oral): Secondary | ICD-10-CM

## 2014-02-07 DIAGNOSIS — N320 Bladder-neck obstruction: Secondary | ICD-10-CM

## 2014-02-07 DIAGNOSIS — S72042D Displaced fracture of base of neck of left femur, subsequent encounter for closed fracture with routine healing: Secondary | ICD-10-CM | POA: Diagnosis not present

## 2014-02-07 DIAGNOSIS — S72309A Unspecified fracture of shaft of unspecified femur, initial encounter for closed fracture: Secondary | ICD-10-CM

## 2014-02-07 DIAGNOSIS — S72042A Displaced fracture of base of neck of left femur, initial encounter for closed fracture: Secondary | ICD-10-CM | POA: Diagnosis not present

## 2014-02-07 DIAGNOSIS — B370 Candidal stomatitis: Secondary | ICD-10-CM

## 2014-02-07 DIAGNOSIS — M25552 Pain in left hip: Secondary | ICD-10-CM | POA: Diagnosis present

## 2014-02-07 DIAGNOSIS — S72002D Fracture of unspecified part of neck of left femur, subsequent encounter for closed fracture with routine healing: Secondary | ICD-10-CM

## 2014-02-07 DIAGNOSIS — J189 Pneumonia, unspecified organism: Secondary | ICD-10-CM

## 2014-02-07 HISTORY — PX: COMPRESSION HIP SCREW: SHX1386

## 2014-02-07 LAB — CBC WITH DIFFERENTIAL/PLATELET
BASOS ABS: 0 10*3/uL (ref 0.0–0.1)
BASOS PCT: 0 % (ref 0–1)
EOS PCT: 2 % (ref 0–5)
Eosinophils Absolute: 0.2 10*3/uL (ref 0.0–0.7)
HCT: 40.3 % (ref 39.0–52.0)
Hemoglobin: 13.2 g/dL (ref 13.0–17.0)
Lymphocytes Relative: 15 % (ref 12–46)
Lymphs Abs: 1.5 10*3/uL (ref 0.7–4.0)
MCH: 29.1 pg (ref 26.0–34.0)
MCHC: 32.8 g/dL (ref 30.0–36.0)
MCV: 88.8 fL (ref 78.0–100.0)
MONO ABS: 0.7 10*3/uL (ref 0.1–1.0)
Monocytes Relative: 7 % (ref 3–12)
Neutro Abs: 7.6 10*3/uL (ref 1.7–7.7)
Neutrophils Relative %: 76 % (ref 43–77)
Platelets: 309 10*3/uL (ref 150–400)
RBC: 4.54 MIL/uL (ref 4.22–5.81)
RDW: 13.7 % (ref 11.5–15.5)
WBC: 9.9 10*3/uL (ref 4.0–10.5)

## 2014-02-07 LAB — COMPREHENSIVE METABOLIC PANEL
ALK PHOS: 72 U/L (ref 39–117)
ALT: 31 U/L (ref 0–53)
ANION GAP: 9 (ref 5–15)
AST: 34 U/L (ref 0–37)
Albumin: 3.3 g/dL — ABNORMAL LOW (ref 3.5–5.2)
BILIRUBIN TOTAL: 0.5 mg/dL (ref 0.3–1.2)
BUN: 10 mg/dL (ref 6–23)
CHLORIDE: 103 meq/L (ref 96–112)
CO2: 27 meq/L (ref 19–32)
Calcium: 8.8 mg/dL (ref 8.4–10.5)
Creatinine, Ser: 0.32 mg/dL — ABNORMAL LOW (ref 0.50–1.35)
GLUCOSE: 109 mg/dL — AB (ref 70–99)
POTASSIUM: 4.3 meq/L (ref 3.7–5.3)
SODIUM: 139 meq/L (ref 137–147)
Total Protein: 7.1 g/dL (ref 6.0–8.3)

## 2014-02-07 LAB — PROTIME-INR
INR: 0.87 (ref 0.00–1.49)
PROTHROMBIN TIME: 12 s (ref 11.6–15.2)

## 2014-02-07 LAB — SURGICAL PCR SCREEN
MRSA, PCR: NEGATIVE
Staphylococcus aureus: NEGATIVE

## 2014-02-07 LAB — TYPE AND SCREEN
ABO/RH(D): B POS
Antibody Screen: NEGATIVE

## 2014-02-07 LAB — ABO/RH: ABO/RH(D): B POS

## 2014-02-07 SURGERY — COMPRESSION HIP
Anesthesia: General | Site: Hip | Laterality: Left

## 2014-02-07 MED ORDER — INFLUENZA VAC SPLIT QUAD 0.5 ML IM SUSY
0.5000 mL | PREFILLED_SYRINGE | INTRAMUSCULAR | Status: DC
  Filled 2014-02-07 (×2): qty 0.5

## 2014-02-07 MED ORDER — ROSUVASTATIN CALCIUM 20 MG PO TABS
20.0000 mg | ORAL_TABLET | Freq: Every day | ORAL | Status: DC
  Administered 2014-02-08 – 2014-02-10 (×3): 20 mg via ORAL
  Filled 2014-02-07 (×3): qty 1

## 2014-02-07 MED ORDER — PROPOFOL 10 MG/ML IV BOLUS
INTRAVENOUS | Status: AC
  Filled 2014-02-07: qty 20

## 2014-02-07 MED ORDER — CEFAZOLIN SODIUM-DEXTROSE 2-3 GM-% IV SOLR
INTRAVENOUS | Status: AC
  Filled 2014-02-07: qty 50

## 2014-02-07 MED ORDER — LACTATED RINGERS IV SOLN: INTRAVENOUS | Status: DC

## 2014-02-07 MED ORDER — CEFAZOLIN SODIUM-DEXTROSE 2-3 GM-% IV SOLR
INTRAVENOUS | Status: DC | PRN
  Administered 2014-02-07: 2 g via INTRAVENOUS

## 2014-02-07 MED ORDER — ATENOLOL 50 MG PO TABS
50.0000 mg | ORAL_TABLET | Freq: Every day | ORAL | Status: DC
  Administered 2014-02-08 – 2014-02-09 (×2): 50 mg via ORAL
  Filled 2014-02-07 (×5): qty 1

## 2014-02-07 MED ORDER — GLYCOPYRROLATE 0.2 MG/ML IJ SOLN
INTRAMUSCULAR | Status: DC | PRN
  Administered 2014-02-07: 0.2 mg via INTRAVENOUS

## 2014-02-07 MED ORDER — HYDROMORPHONE HCL 1 MG/ML IJ SOLN
1.0000 mg | Freq: Once | INTRAMUSCULAR | Status: AC
  Administered 2014-02-07: 1 mg via INTRAVENOUS
  Filled 2014-02-07: qty 1

## 2014-02-07 MED ORDER — LACTATED RINGERS IV SOLN
INTRAVENOUS | Status: DC | PRN
  Administered 2014-02-07 (×2): via INTRAVENOUS

## 2014-02-07 MED ORDER — ONDANSETRON HCL 4 MG/2ML IJ SOLN
4.0000 mg | Freq: Once | INTRAMUSCULAR | Status: AC
  Administered 2014-02-07: 4 mg via INTRAVENOUS
  Filled 2014-02-07: qty 2

## 2014-02-07 MED ORDER — LIDOCAINE HCL (CARDIAC) 20 MG/ML IV SOLN
INTRAVENOUS | Status: DC | PRN
  Administered 2014-02-07: 100 mg via INTRAVENOUS

## 2014-02-07 MED ORDER — PROPOFOL 10 MG/ML IV BOLUS
INTRAVENOUS | Status: DC | PRN
  Administered 2014-02-07: 150 mg via INTRAVENOUS

## 2014-02-07 MED ORDER — HYDROMORPHONE HCL 1 MG/ML IJ SOLN
0.2500 mg | INTRAMUSCULAR | Status: DC | PRN
  Administered 2014-02-07 – 2014-02-08 (×3): 0.5 mg via INTRAVENOUS

## 2014-02-07 MED ORDER — LIDOCAINE HCL (CARDIAC) 20 MG/ML IV SOLN
INTRAVENOUS | Status: AC
  Filled 2014-02-07: qty 5

## 2014-02-07 MED ORDER — PHENYLEPHRINE HCL 10 MG/ML IJ SOLN
INTRAMUSCULAR | Status: DC | PRN
  Administered 2014-02-07 (×2): 80 ug via INTRAVENOUS
  Administered 2014-02-07: 40 ug via INTRAVENOUS
  Administered 2014-02-07 (×2): 80 ug via INTRAVENOUS

## 2014-02-07 MED ORDER — PREGABALIN 75 MG PO CAPS
75.0000 mg | ORAL_CAPSULE | Freq: Two times a day (BID) | ORAL | Status: DC
  Administered 2014-02-08 – 2014-02-10 (×5): 75 mg via ORAL
  Filled 2014-02-07 (×5): qty 1

## 2014-02-07 MED ORDER — PREGABALIN 50 MG PO CAPS: 75.0000 mg | ORAL_CAPSULE | Freq: Two times a day (BID) | ORAL | Status: DC

## 2014-02-07 MED ORDER — FENTANYL CITRATE 0.05 MG/ML IJ SOLN
INTRAMUSCULAR | Status: AC
  Filled 2014-02-07: qty 2

## 2014-02-07 MED ORDER — HYDROMORPHONE HCL 1 MG/ML IJ SOLN
INTRAMUSCULAR | Status: AC
  Filled 2014-02-07: qty 1

## 2014-02-07 MED ORDER — PANTOPRAZOLE SODIUM 40 MG PO TBEC: 40.0000 mg | DELAYED_RELEASE_TABLET | Freq: Every day | ORAL | Status: DC

## 2014-02-07 MED ORDER — ASPIRIN EC 325 MG PO TBEC: 325.0000 mg | DELAYED_RELEASE_TABLET | Freq: Every day | ORAL | Status: DC

## 2014-02-07 MED ORDER — HYDROCODONE-ACETAMINOPHEN 5-325 MG PO TABS: 1.0000 | ORAL_TABLET | Freq: Four times a day (QID) | ORAL | Status: DC | PRN

## 2014-02-07 MED ORDER — MORPHINE SULFATE 2 MG/ML IJ SOLN
0.5000 mg | INTRAMUSCULAR | Status: DC | PRN
  Administered 2014-02-07 – 2014-02-08 (×6): 0.5 mg via INTRAVENOUS
  Filled 2014-02-07 (×6): qty 1

## 2014-02-07 MED ORDER — HYDROCODONE-ACETAMINOPHEN 5-325 MG PO TABS
1.0000 | ORAL_TABLET | Freq: Four times a day (QID) | ORAL | Status: DC | PRN
  Administered 2014-02-07 – 2014-02-08 (×2): 2 via ORAL
  Administered 2014-02-09 – 2014-02-10 (×3): 1 via ORAL
  Administered 2014-02-10: 2 via ORAL
  Filled 2014-02-07 (×5): qty 2
  Filled 2014-02-07: qty 1

## 2014-02-07 MED ORDER — BUPIVACAINE HCL (PF) 0.5 % IJ SOLN
INTRAMUSCULAR | Status: AC
  Filled 2014-02-07: qty 30

## 2014-02-07 MED ORDER — ACETAMINOPHEN 10 MG/ML IV SOLN
1000.0000 mg | Freq: Once | INTRAVENOUS | Status: AC
  Administered 2014-02-07: 1000 mg via INTRAVENOUS
  Filled 2014-02-07: qty 100

## 2014-02-07 MED ORDER — HEPARIN SODIUM (PORCINE) 5000 UNIT/ML IJ SOLN
5000.0000 [IU] | Freq: Three times a day (TID) | INTRAMUSCULAR | Status: DC
  Filled 2014-02-07 (×3): qty 1

## 2014-02-07 MED ORDER — FENTANYL CITRATE 0.05 MG/ML IJ SOLN
INTRAMUSCULAR | Status: DC | PRN
  Administered 2014-02-07 (×4): 25 ug via INTRAVENOUS

## 2014-02-07 MED ORDER — PANTOPRAZOLE SODIUM 40 MG PO TBEC
40.0000 mg | DELAYED_RELEASE_TABLET | Freq: Every day | ORAL | Status: DC
Start: 2014-02-08 — End: 2014-02-10
  Administered 2014-02-08 – 2014-02-10 (×3): 40 mg via ORAL
  Filled 2014-02-07 (×3): qty 1

## 2014-02-07 MED ORDER — PNEUMOCOCCAL VAC POLYVALENT 25 MCG/0.5ML IJ INJ
0.5000 mL | INJECTION | INTRAMUSCULAR | Status: DC
  Filled 2014-02-07 (×2): qty 0.5

## 2014-02-07 MED ORDER — 0.9 % SODIUM CHLORIDE (POUR BTL) OPTIME
TOPICAL | Status: DC | PRN
  Administered 2014-02-07: 1000 mL

## 2014-02-07 SURGICAL SUPPLY — 40 items
BAG SPEC THK2 15X12 ZIP CLS (MISCELLANEOUS) ×1
BAG ZIPLOCK 12X15 (MISCELLANEOUS) ×3 IMPLANT
BIT DRILL 4.0X195MM (BIT) IMPLANT
BLADE TI HELICAL 11.0 95 (Orthopedic Implant) ×1 IMPLANT
BLADE TI HELICAL 11.0MM 95MM (Orthopedic Implant) ×1 IMPLANT
BNDG COHESIVE 4X5 TAN STRL (GAUZE/BANDAGES/DRESSINGS) ×1 IMPLANT
BNDG GAUZE ELAST 4 BULKY (GAUZE/BANDAGES/DRESSINGS) ×3 IMPLANT
BOOTIES KNEE HIGH SLOAN (MISCELLANEOUS) ×4 IMPLANT
CLOSURE WOUND 1/2 X4 (GAUZE/BANDAGES/DRESSINGS)
DRAPE BACK TABLE (DRAPES) ×3 IMPLANT
DRAPE LEGGING IMPERMEABLE (DRAPES) ×2 IMPLANT
DRAPE STERI IOBAN 125X83 (DRAPES) ×3 IMPLANT
DRAPE SURG 17X11 SM STRL (DRAPES) ×2 IMPLANT
DRILL BIT 4.0X195MM (BIT) ×2
DRSG EMULSION OIL 3X16 NADH (GAUZE/BANDAGES/DRESSINGS) ×1 IMPLANT
DRSG MEPILEX BORDER 4X4 (GAUZE/BANDAGES/DRESSINGS) ×2 IMPLANT
DRSG PAD ABDOMINAL 8X10 ST (GAUZE/BANDAGES/DRESSINGS) ×1 IMPLANT
ELECT REM PT RETURN 9FT ADLT (ELECTROSURGICAL) ×3
ELECTRODE REM PT RTRN 9FT ADLT (ELECTROSURGICAL) ×1 IMPLANT
EVACUATOR 1/8 PVC DRAIN (DRAIN) ×1 IMPLANT
GAUZE SPONGE 4X4 12PLY STRL (GAUZE/BANDAGES/DRESSINGS) ×1 IMPLANT
GLOVE BIO SURGEON STRL SZ8 (GLOVE) ×3 IMPLANT
GLOVE ECLIPSE 7.5 STRL STRAW (GLOVE) ×3 IMPLANT
GUIDEWIRE 3.2X400 (WIRE) ×6 IMPLANT
KIT BASIN OR (CUSTOM PROCEDURE TRAY) ×3 IMPLANT
MANIFOLD NEPTUNE II (INSTRUMENTS) ×3 IMPLANT
NAIL FIXATION (Nail) ×2 IMPLANT
NS IRRIG 1000ML POUR BTL (IV SOLUTION) ×3 IMPLANT
PACK GENERAL/GYN (CUSTOM PROCEDURE TRAY) ×3 IMPLANT
POSITIONER SURGICAL ARM (MISCELLANEOUS) ×5 IMPLANT
REAMER ROD DEEP FLUTE 2.5X950 (INSTRUMENTS) ×2 IMPLANT
SCREW LOCKING IM 5.0MX40M (Screw) ×2 IMPLANT
SCREW LOCKING IM 5.0MX44M (Screw) ×2 IMPLANT
STAPLER VISISTAT 35W (STAPLE) ×1 IMPLANT
STRIP CLOSURE SKIN 1/2X4 (GAUZE/BANDAGES/DRESSINGS) ×1 IMPLANT
SUT MNCRL AB 4-0 PS2 18 (SUTURE) ×1 IMPLANT
SUT VIC AB 1 CT1 36 (SUTURE) ×4 IMPLANT
SUT VIC AB 2-0 CT1 27 (SUTURE) ×3
SUT VIC AB 2-0 CT1 TAPERPNT 27 (SUTURE) ×2 IMPLANT
WATER STERILE IRR 1500ML POUR (IV SOLUTION) ×3 IMPLANT

## 2014-02-07 NOTE — Anesthesia Preprocedure Evaluation (Addendum)
Anesthesia Evaluation  Patient identified by MRN, date of birth, ID band Patient awake    Reviewed: Allergy & Precautions, H&P , NPO status , Patient's Chart, lab work & pertinent test results  Airway Mallampati: II TM Distance: >3 FB Neck ROM: full    Dental no notable dental hx. (+) Teeth Intact, Dental Advisory Given   Pulmonary pneumonia -, resolved,  breath sounds clear to auscultation  Pulmonary exam normal       Cardiovascular Exercise Tolerance: Good hypertension, Pt. on medications and Pt. on home beta blockers Rhythm:regular Rate:Normal     Neuro/Psych Vertigo. ALS  Neuromuscular disease negative psych ROS   GI/Hepatic negative GI ROS, Neg liver ROS, GERD-  Medicated and Controlled,  Endo/Other  negative endocrine ROSImpaired glucose intolerance  Renal/GU negative Renal ROS  negative genitourinary   Musculoskeletal   Abdominal   Peds  Hematology negative hematology ROS (+)   Anesthesia Other Findings   Reproductive/Obstetrics negative OB ROS                        Anesthesia Physical Anesthesia Plan  ASA: III  Anesthesia Plan: General   Post-op Pain Management:    Induction: Intravenous  Airway Management Planned: LMA  Additional Equipment:   Intra-op Plan:   Post-operative Plan:   Informed Consent: I have reviewed the patients History and Physical, chart, labs and discussed the procedure including the risks, benefits and alternatives for the proposed anesthesia with the patient or authorized representative who has indicated his/her understanding and acceptance.   Dental Advisory Given  Plan Discussed with: CRNA and Surgeon  Anesthesia Plan Comments:       Anesthesia Quick Evaluation

## 2014-02-07 NOTE — ED Notes (Signed)
Attempted to call report. Receiving nurse with another patient and is to return call for report when available.

## 2014-02-07 NOTE — ED Provider Notes (Signed)
CSN: [id number]     Arrival date & time 02/07/14  0734 History   First MD Initiated Contact with Patient 02/07/14 [phone]     Chief Complaint  Patient presents with  . Fall  . Leg Pain     (Consider location/radiation/quality/duration/timing/severity/associated sxs/prior Treatment) Patient is a 68 y.o. male presenting with fall and leg pain. The history is provided by the patient. No language interpreter was used.  Fall This is a new problem. The current episode started today. The problem occurs constantly. The problem has been gradually worsening. Pertinent negatives include no joint swelling. Nothing aggravates the symptoms. He has tried nothing for the symptoms. The treatment provided moderate relief.  Leg Pain Pt fell this am getting off of toilet.   Pt hit left leg.   Pt now unable to tolerate weight bearing.    Past Medical History  Diagnosis Date  . ALLERGIC RHINITIS   . ALS   . HYPERLIPIDEMIA   . HYPERTENSION   . VERTIGO   . LOW BACK PAIN   . Impaired glucose tolerance 02/17/2011   Past Surgical History  Procedure Laterality Date  . Lipoma removal  2003  . Tracheostomy    . Percutaneous gastrostomy tube repalcement      04/1999 and removed 07/1999   Family History  Problem Relation Age of Onset  . Other Mother     Deceased  . Other Father     Deceased  . Other Son 2    x 2 starvation/illness from refugee [place]  . Other Daughter 2    x 2 starvation/illness from refugee [place]   History  Substance Use Topics  . Smoking status: Never Smoker   . Smokeless tobacco: Never Used  . Alcohol Use: No    Review of Systems  Musculoskeletal: Negative for joint swelling.  All other systems reviewed and are negative.     Allergies  Review of patient's allergies indicates no known allergies.  Home Medications   Prior to Admission medications   Medication Sig Start Date End Date Taking? Authorizing Provider  amLODipine-benazepril (LOTREL) 10-40 MG per capsule Take 1  capsule by mouth daily. 10/05/13   Corwin LevinsJames W [name], MD  atenolol (TENORMIN) 50 MG tablet TAKE 1 TABLET BY MOUTH EVERY DAY AT BEDTIME 10/05/13   Corwin LevinsJames W [name], MD  cetirizine (ZYRTEC) 10 MG tablet Take 1 tablet (10 mg total) by mouth daily. 10/05/13   Corwin LevinsJames W [name], MD  diclofenac (VOLTAREN) 25 MG EC tablet Take 1 tablet (25 mg total) by mouth 2 (two) times daily as needed. 01/14/14   Donika K Patel, DO  lidocaine (XYLOCAINE) 5 % ointment Apply 1 application topically 2 (two) times daily as needed. Use gloves when applying to low back. 01/14/14   Donika Concha SeK Patel, DO  meclizine (ANTIVERT) 25 MG tablet TAKE 1 TABLET BY MOUTH 3 TIMES A DAY AS NEEDED FOR DIZZINESS OR NAUSEA    Corwin LevinsJames W [name], MD  omeprazole (PRILOSEC) 20 MG capsule TAKE ONE CAPSULE BY MOUTH EVERY DAY 10/01/13   Corwin LevinsJames W [name], MD  pregabalin (LYRICA) 75 MG capsule Take 1 capsule (75 mg total) by mouth 2 (two) times daily. 02/02/14   Donika K Patel, DO  rosuvastatin (CRESTOR) 20 MG tablet Take 1 tablet (20 mg total) by mouth daily. 10/05/13   Corwin LevinsJames W [name], MD   BP 124/81  Pulse 55  Temp(Src) 98.1 F (36.7 C) (Oral)  Resp 16  SpO2 95% Physical Exam  Nursing note and vitals  reviewed. Constitutional: He appears well-developed and well-nourished.  HENT:  Head: Normocephalic and atraumatic.  Right Ear: External ear normal.  Left Ear: External ear normal.  Eyes: Pupils are equal, round, and reactive to light.  Neck: Normal range of motion.  Cardiovascular: Normal rate and regular rhythm.   Pulmonary/Chest: Effort normal and breath sounds normal.  Musculoskeletal: He exhibits tenderness.  Neurological: He is alert.  Skin: Skin is warm.  Psychiatric: He has a normal mood and affect.    ED Course  Procedures (including critical care time) Labs Review Labs Reviewed - No data to display  Imaging Review No results found.   EKG Interpretation None      MDM   Final diagnoses:  Femoral neck fracture, left, closed, initial encounter   I  spoke to Dr. Luiz BlareGraves who reports he will do surgery this pm at 7:30.   He request hospitalist admit.   I spoke to hospitalist who will see here and admit.  Preop labs ordered    Elson AreasLeslie K Sofia, PA-C 02/07/14 1253

## 2014-02-07 NOTE — Progress Notes (Addendum)
INITIAL NUTRITION ASSESSMENT  DOCUMENTATION CODES Per approved criteria  -Not Applicable   INTERVENTION: Currently NPO for surgery.  Diet advancement per MD. Noted SLP consult for swallow eval. RD to follow.  NUTRITION DIAGNOSIS: Inadequate oral intake related to inability to eat as evidenced by npo status.   Goal: Diet advancement with intake of meals/supplements to meet >90% estimated needs.  Monitor:  Intake, labs, weight trend  Reason for Assessment: hip protocol  68 y.o. male  Admitting Dx: <principal problem not specified>  ASSESSMENT: Patient admitted s/p fall with femoral neck fracture, left, closed.  Patient is for surgery today.  Hx includes muscular atrophy since 2000.  Hx of PEG 04/1999-07/1999.  Patient is [place] and speaks some [place].  Reports that he ate well prior to admit.  UBW of 156 lbs.  Good appetite.    Height: Ht Readings from Last 1 Encounters:  01/17/14 5\' 5"  (1.651 m)    Weight: Wt Readings from Last 1 Encounters:  01/17/14 156 lb (70.761 kg)    Ideal Body Weight: 136 lbs  % Ideal Body Weight: 115  Wt Readings from Last 10 Encounters:  01/17/14 156 lb (70.761 kg)  01/14/14 156 lb (70.761 kg)  12/01/13 145 lb (65.772 kg)  10/05/13 150 lb (68.04 kg)  06/09/13 153 lb 14.1 oz (69.8 kg)  02/23/13 152 lb (68.947 kg)  10/12/12 147 lb (66.679 kg)  06/28/12 140 lb (63.504 kg)  02/07/12 146 lb (66.225 kg)  04/11/11 147 lb (66.679 kg)    Usual Body Weight: 156  % Usual Body Weight: 156  BMI:  25.6  Estimated Nutritional Needs: Kcal: 1800-1900 Protein: 70-80 gm daily Fluid: >1.8L daily  Skin: intact  Diet Order: NPO  EDUCATION NEEDS: -No education needs identified at this time   Intake/Output Summary (Last 24 hours) at 02/07/14 1616 Last data filed at 02/07/14 1400  Gross per 24 hour  Intake      0 ml  Output    220 ml  Net   -220 ml    Labs:   Recent Labs Lab 02/07/14 1107  NA 139  K 4.3  CL 103  CO2 27   BUN 10  CREATININE 0.32*  CALCIUM 8.8  GLUCOSE 109*    CBG (last 3)  No results found for this basename: GLUCAP,  in the last 72 hours  Scheduled Meds: . atenolol  50 mg Oral QHS  . heparin  5,000 Units Subcutaneous 3 times per day  . [START ON 02/08/2014] Influenza vac split quadrivalent PF  0.5 mL Intramuscular Tomorrow-1000  . [START ON 02/08/2014] pantoprazole  40 mg Oral Daily  . [START ON 02/08/2014] pneumococcal 23 valent vaccine  0.5 mL Intramuscular Tomorrow-1000  . pregabalin  75 mg Oral BID  . [START ON 02/08/2014] rosuvastatin  20 mg Oral Daily    Continuous Infusions:   Past Medical History  Diagnosis Date  . ALLERGIC RHINITIS   . ALS   . HYPERLIPIDEMIA   . HYPERTENSION   . VERTIGO   . LOW BACK PAIN   . Impaired glucose tolerance 02/17/2011    Past Surgical History  Procedure Laterality Date  . Lipoma removal  2003  . Tracheostomy    . Percutaneous gastrostomy tube repalcement      04/1999 and removed 07/1999    Oran ReinLaura Juleah Paradise, RD, LDN Clinical Inpatient Dietitian Pager:  [phone] Weekend and after hours pager:  [phone]

## 2014-02-07 NOTE — Transfer of Care (Signed)
Immediate Anesthesia Transfer of Care Note  Patient: Thomas Raymond  Procedure(s) Performed: Procedure(s): IM Nail (Left)  Patient Location: PACU  Anesthesia Type:General  Level of Consciousness: awake  Airway & Oxygen Therapy: Patient Spontanous Breathing and Patient connected to face mask oxygen  Post-op Assessment: Report given to PACU RN and Post -op Vital signs reviewed and stable  Post vital signs: Reviewed and stable  Complications: No apparent anesthesia complications

## 2014-02-07 NOTE — ED Notes (Signed)
Patient transported to X-ray 

## 2014-02-07 NOTE — Brief Op Note (Signed)
02/07/2014  11:13 PM  PATIENT:  Thomas Raymond  68 y.o. male  PRE-OPERATIVE DIAGNOSIS:  intertrochanteric fracture left hip  POST-OPERATIVE DIAGNOSIS:  intertrochanteric fracture left hip  PROCEDURE:  Procedure(s): IM Nail (Left)  SURGEON:  Surgeon(s) and Role:    * Harvie JuniorJohn L Shaina Gullatt, MD - Primary  PHYSICIAN ASSISTANT:   ASSISTANTS: bethuine   ANESTHESIA:   general  EBL:  Total I/O In: 1000 [I.V.:1000] Out: 125 [Urine:50; Blood:75]  BLOOD ADMINISTERED:none  DRAINS: none   LOCAL MEDICATIONS USED:  NONE  SPECIMEN:  No Specimen  DISPOSITION OF SPECIMEN:  N/A  COUNTS:  YES  TOURNIQUET:  * No tourniquets in log *  DICTATION: .Other Dictation: Dictation Number [id number]  PLAN OF CARE: Admit to inpatient   PATIENT DISPOSITION:  PACU - hemodynamically stable.   Delay start of Pharmacological VTE agent (>24hrs) due to surgical blood loss or risk of bleeding: no

## 2014-02-07 NOTE — H&P (Signed)
History and Physical  Thomas Raymond RUE:[id number] DOB: 12-10-1945 DOA: 02/07/2014   PCP: Oliver Barre, MD   Chief Complaint: Mechanical fall  HPI:  68 year old male with a history of motor neuron disease, hyperlipidemia, hypertension presented with a mechanical fall on the evening prior to admission. The patient had increasing pain through the night prompting him to come to the emergency department. The patient fell as he was getting up off the bed side commode getting back to his bed. He did not call anyone less than because he did not want to trouble anybody, but as his pancreas, he came to the emergency department on the day of admission. The patient denies any syncope. He denies any chest pain, shortness breath, nausea, vomiting, diarrhea, abdominal pain, dizziness. The patient has chronic lower extremity weakness secondary to his motor neuron disease. In addition he has chronic dysphagia secondary to his motor neuron disease. He sees a Advertising account executive, Dr. Nita Sickle at [facility]. At baseline, the patient uses a walker to assist with ambulation In the emergency department, CBC and BMP were unremarkable. Her enzymes were unremarkable. EKG showed sinus rhythm with incomplete RBBB, but this was unchanged from previous EKGs. Chest x-ray was negative for any infiltrates. X-ray of the right leg showed a left femoral neck fracture. Assessment/Plan: Left femoral neck fracture -Orthopedics has been consulted -Dr. Luiz Blare to take pt to OR today. -pain control -PT/OT Motor neuron disease with Dysphagia -speech therapy eval -PT/OT Hypertension -Continue atenolol -hold Lotrel due to relatively soft BP Hyperlipidemia -Continue statin Chronic leg pain -Continue Lyrica       Past Medical History  Diagnosis Date  . ALLERGIC RHINITIS   . ALS   . HYPERLIPIDEMIA   . HYPERTENSION   . VERTIGO   . LOW BACK PAIN   . Impaired glucose tolerance 02/17/2011   Past Surgical  History  Procedure Laterality Date  . Lipoma removal  2003  . Tracheostomy    . Percutaneous gastrostomy tube repalcement      04/1999 and removed 07/1999   Social History:  reports that he has never smoked. He has never used smokeless tobacco. He reports that he does not drink alcohol or use illicit drugs.   Family History  Problem Relation Age of Onset  . Other Mother     Deceased  . Other Father     Deceased  . Other Son 2    x 2 starvation/illness from [place]  . Other Daughter 2    x 2 starvation/illness from [place]     No Known Allergies    Prior to Admission medications   Medication Sig Start Date End Date Taking? Authorizing Provider  amLODipine-benazepril (LOTREL) 10-40 MG per capsule Take 1 capsule by mouth daily. 10/05/13  Yes Corwin Levins, MD  atenolol (TENORMIN) 50 MG tablet Take 50 mg by mouth at bedtime.   Yes Historical Provider, MD  cetirizine (ZYRTEC) 10 MG tablet Take 1 tablet (10 mg total) by mouth daily. 10/05/13  Yes Corwin Levins, MD  meloxicam (MOBIC) 7.5 MG tablet Take 7.5 mg by mouth daily.   Yes Historical Provider, MD  omeprazole (PRILOSEC) 20 MG capsule Take 20 mg by mouth daily.   Yes Historical Provider, MD  pregabalin (LYRICA) 75 MG capsule Take 1 capsule (75 mg total) by mouth 2 (two) times daily. 02/02/14  Yes Donika K Patel, DO  rosuvastatin (CRESTOR) 20 MG tablet Take 1 tablet (20 mg total) by mouth  daily. 10/05/13  Yes Corwin LevinsJames W [name], MD  traMADol (ULTRAM) 50 MG tablet Take by mouth every 6 (six) hours as needed.   Yes Historical Provider, MD    Review of Systems:  Constitutional:  No weight loss, night sweats, Fevers, chills, Head&Eyes: No headache.  No vision loss.  No eye pain or scotoma ENT:  No Difficulty swallowing,Tooth/dental problems,Sore throat,   Cardio-vascular:  No chest pain, Orthopnea, PND, swelling in lower extremities,  dizziness, palpitations  GI:  No  abdominal pain, nausea, vomiting, diarrhea, loss of  appetite, hematochezia, melena, heartburn, indigestion, Resp:  No shortness of breath with exertion or at rest. No cough. No coughing up of blood .No wheezing.No chest wall deformity  Skin:  no rash or lesions.  GU:  no dysuria, change in color of urine, no urgency or frequency. No flank pain.  Musculoskeletal:  No joint pain or swelling. No decreased range of motion. No back pain.  Psych:  No change in mood or affect. No depression or anxiety. Neurologic: No headache, no dysesthesia, no focal weakness, no vision loss. No syncope  Physical Exam: Filed Vitals:   02/07/14 1115 02/07/14 1358 02/07/14 1703 02/07/14 1800  BP: 121/62 147/59  105/52  Pulse: 57   51  Temp:  99.8 F (37.7 C)  98.6 F (37 C)  TempSrc:  Oral  Oral  Resp: 16 20  16   Height:   5\' 5"  (1.651 m)   Weight:   72.576 kg (160 lb)   SpO2: 90%   96%   General:  A&O x 3, NAD, nontoxic, pleasant/cooperative Head/Eye: No conjunctival hemorrhage, no icterus, [place]/AT, No nystagmus ENT:  No icterus,  No thrush, no pharyngeal exudate Neck:  No masses, no lymphadenpathy, no bruits CV:  RRR, no rub, no gallop, no S3 Lung:  Diminished breath sounds but no wheezing. Good air movement. Abdomen: soft/NT, +BS, nondistended, no peritoneal signs Ext: No cyanosis, No rashes, No petechiae, No lymphangitis, No edema   Labs on Admission:  Basic Metabolic Panel:  Recent Labs Lab 02/07/14 1107  NA 139  K 4.3  CL 103  CO2 27  GLUCOSE 109*  BUN 10  CREATININE 0.32*  CALCIUM 8.8   Liver Function Tests:  Recent Labs Lab 02/07/14 1107  AST 34  ALT 31  ALKPHOS 72  BILITOT 0.5  PROT 7.1  ALBUMIN 3.3*   No results found for this basename: LIPASE, AMYLASE,  in the last 168 hours No results found for this basename: AMMONIA,  in the last 168 hours CBC:  Recent Labs Lab 02/07/14 0935  WBC 9.9  NEUTROABS 7.6  HGB 13.2  HCT 40.3  MCV 88.8  PLT 309   Cardiac Enzymes: No results found for this basename: CKTOTAL,  CKMB, CKMBINDEX, TROPONINI,  in the last 168 hours BNP: No components found with this basename: POCBNP,  CBG: No results found for this basename: GLUCAP,  in the last 168 hours  Radiological Exams on Admission: Dg Chest 1 View  02/07/2014   CLINICAL DATA:  Fall, left hip fracture  EXAM: CHEST - 1 VIEW  COMPARISON:  06/07/2013  FINDINGS: Stable cardiomegaly with mild vascular congestion. Right hemidiaphragm remains elevated as before. No superimposed CHF or pneumonia. Negative for effusion or pneumothorax. Trachea midline. Postop changes in the right supraclavicular region. Atherosclerosis of the aorta noted.  IMPRESSION: Stable cardiomegaly without CHF or pneumonia.  Chronic right hemidiaphragm elevation.   Electronically Signed   By: Ruel Favorsrevor  Shick M.D.   On: 02/07/2014  08:56   Dg Lumbar Spine Complete  02/07/2014   CLINICAL DATA:  Fall last night while getting off of a bedside commode. Low back and left leg pain which has increased with difficulty moving the left leg. Initial encounter.  EXAM: LUMBAR SPINE - COMPLETE 4+ VIEW  COMPARISON:  10/05/2012  FINDINGS: There are 5 non rib-bearing lumbar type vertebral bodies. Mild lumbar dextroscoliosis is again seen, minimally more prominent than on the prior study. No definite listhesis or compression fracture is identified, however evaluation of the lower lumbar spine is mildly limited by obliquity on cross-table lateral radiographs due to scoliosis. Moderate to severe disc space narrowing is present at L4-5 greater than L3-4, grossly similar to the prior study. Moderate multilevel osteophytosis is present. Atherosclerotic aortic calcification is noted. No definite pars defects are identified. 1.1 cm sclerotic focus in the supra-acetabular left ilium is unchanged and likely benign, possibly a bone island.  IMPRESSION: No acute osseous abnormality identified. Mild scoliosis and moderate multilevel spondylosis.   Electronically Signed   By: Sebastian AcheAllen  Grady   On:  02/07/2014 09:00   Dg Hip Complete Left  02/07/2014   CLINICAL DATA:  Status post fall Perma bedside commode last night. Left hip pain.  EXAM: LEFT HIP - COMPLETE 2+ VIEW  COMPARISON:  None.  FINDINGS: The patient has an acute fracture through the base of the left femoral neck. The femoral head is located. No other acute bony or joint abnormality is identified.  IMPRESSION: Acute basicervical left femoral neck fracture.   Electronically Signed   By: Drusilla Kannerhomas  Dalessio M.D.   On: 02/07/2014 08:53    EKG: Independently reviewed. Sinus rhythm, incomplete right bundle    Time spent:50 minutes Code Status:   FULL Family Communication:   Wife updated at bedside   Thomas Debois, DO  [facility] Pager [phone]  If 7PM-7AM, please contact night-coverage [email or web address] Password [email or web address] 02/07/2014, 6:47 PM

## 2014-02-07 NOTE — ED Notes (Signed)
Bed: WA17 Expected date:  Expected time:  Means of arrival:  Comments: EMS fall 

## 2014-02-07 NOTE — Progress Notes (Addendum)
SLP Cancellation Note  Patient Details Name: [name] MRN: [id number] DOB: 10/24/1945   Cancelled treatment:        Attempted to see pt for swallow evaluation.  Pt states he can swallow without difficulty.   No problem."  RN reports pt is NPO for surgery tonight at 7pm.  Will defer for now and f/u 10/20 post surgery.     Maryjo RochesterWillis, Lorrena Goranson T 02/07/2014, 3:48 PM

## 2014-02-07 NOTE — Anesthesia Postprocedure Evaluation (Signed)
  Anesthesia Post-op Note  Patient: Thomas Raymond  Procedure(s) Performed: Procedure(s) (LRB): IM Nail (Left)  Patient Location: PACU  Anesthesia Type: General  Level of Consciousness: awake and alert   Airway and Oxygen Therapy: Patient Spontanous Breathing  Post-op Pain: mild  Post-op Assessment: Post-op Vital signs reviewed, Patient's Cardiovascular Status Stable, Respiratory Function Stable, Patent Airway and No signs of Nausea or vomiting  Last Vitals:  Filed Vitals:   02/07/14 2100  BP: 115/54  Pulse: 55  Temp: 37.1 C  Resp: 18    Post-op Vital Signs: stable   Complications: No apparent anesthesia complications

## 2014-02-07 NOTE — Consult Note (Addendum)
Reason for Consult:fractured hip Referring Physician: hospitalists  Thomas Raymond is an 68 y.o. male.  HPI: 68 yo male fell today and c/o l hip pain.  X-rays show fx of hip.  Pt admitted to medicine and cleared for surgery and we are consulted for surgery. Pt has history of motor neuron disease and is esentially bed ridden  Past Medical History  Diagnosis Date  . ALLERGIC RHINITIS   . ALS   . HYPERLIPIDEMIA   . HYPERTENSION   . VERTIGO   . LOW BACK PAIN   . Impaired glucose tolerance 02/17/2011    Past Surgical History  Procedure Laterality Date  . Lipoma removal  2003  . Tracheostomy    . Percutaneous gastrostomy tube repalcement      04/1999 and removed 07/1999    Family History  Problem Relation Age of Onset  . Other Mother     Deceased  . Other Father     Deceased  . Other Son 2    x 2 starvation/illness from [place]  . Other Daughter 2    x 2 starvation/illness from [place]    Social History:  reports that he has never smoked. He has never used smokeless tobacco. He reports that he does not drink alcohol or use illicit drugs.  Allergies: No Known Allergies  Medications: I have reviewed the patient's current medications.  Results for orders placed during the hospital encounter of 02/07/14 (from the past 48 hour(s))  CBC WITH DIFFERENTIAL     Status: None   Collection Time    02/07/14  9:35 AM      Result Value Ref Range   WBC 9.9  4.0 - 10.5 K/uL   RBC 4.54  4.22 - 5.81 MIL/uL   Hemoglobin 13.2  13.0 - 17.0 g/dL   HCT 40.3  39.0 - 52.0 %   MCV 88.8  78.0 - 100.0 fL   MCH 29.1  26.0 - 34.0 pg   MCHC 32.8  30.0 - 36.0 g/dL   RDW 13.7  11.5 - 15.5 %   Platelets 309  150 - 400 K/uL   Neutrophils Relative % 76  43 - 77 %   Neutro Abs 7.6  1.7 - 7.7 K/uL   Lymphocytes Relative 15  12 - 46 %   Lymphs Abs 1.5  0.7 - 4.0 K/uL   Monocytes Relative 7  3 - 12 %   Monocytes Absolute 0.7  0.1 - 1.0 K/uL   Eosinophils Relative 2  0 - 5 %   Eosinophils Absolute  0.2  0.0 - 0.7 K/uL   Basophils Relative 0  0 - 1 %   Basophils Absolute 0.0  0.0 - 0.1 K/uL  PROTIME-INR     Status: None   Collection Time    02/07/14  9:35 AM      Result Value Ref Range   Prothrombin Time 12.0  11.6 - 15.2 seconds   INR 0.87  0.00 - 1.49  TYPE AND SCREEN     Status: None   Collection Time    02/07/14  9:35 AM      Result Value Ref Range   ABO/RH(D) B POS     Antibody Screen NEG     Sample Expiration 02/10/2014    ABO/RH     Status: None   Collection Time    02/07/14  9:35 AM      Result Value Ref Range   ABO/RH(D) B POS    COMPREHENSIVE  METABOLIC PANEL     Status: Abnormal   Collection Time    02/07/14 11:07 AM      Result Value Ref Range   Sodium 139  137 - 147 mEq/L   Potassium 4.3  3.7 - 5.3 mEq/L   Chloride 103  96 - 112 mEq/L   CO2 27  19 - 32 mEq/L   Glucose, Bld 109 (*) 70 - 99 mg/dL   BUN 10  6 - 23 mg/dL   Creatinine, Ser 0.32 (*) 0.50 - 1.35 mg/dL   Calcium 8.8  8.4 - 10.5 mg/dL   Total Protein 7.1  6.0 - 8.3 g/dL   Albumin 3.3 (*) 3.5 - 5.2 g/dL   AST 34  0 - 37 U/L   ALT 31  0 - 53 U/L   Alkaline Phosphatase 72  39 - 117 U/L   Total Bilirubin 0.5  0.3 - 1.2 mg/dL   GFR calc non Af Amer >90  >90 mL/min   GFR calc Af Amer >90  >90 mL/min   Comment: (NOTE)     The eGFR has been calculated using the CKD EPI equation.     This calculation has not been validated in all clinical situations.     eGFR's persistently <90 mL/min signify possible Chronic Kidney     Disease.   Anion gap 9  5 - 15  SURGICAL PCR SCREEN     Status: None   Collection Time    02/07/14  2:32 PM      Result Value Ref Range   MRSA, PCR NEGATIVE  NEGATIVE   Staphylococcus aureus NEGATIVE  NEGATIVE   Comment:            The Xpert SA Assay (FDA     approved for NASAL specimens     in patients over 70 years of age),     is one component of     a comprehensive surveillance     program.  Test performance has     been validated by [employer] for patients greater      than or equal to 80 year old.     It is not intended     to diagnose infection nor to     guide or monitor treatment.    Dg Chest 1 View  02/07/2014   CLINICAL DATA:  Fall, left hip fracture  EXAM: CHEST - 1 VIEW  COMPARISON:  06/07/2013  FINDINGS: Stable cardiomegaly with mild vascular congestion. Right hemidiaphragm remains elevated as before. No superimposed CHF or pneumonia. Negative for effusion or pneumothorax. Trachea midline. Postop changes in the right supraclavicular region. Atherosclerosis of the aorta noted.  IMPRESSION: Stable cardiomegaly without CHF or pneumonia.  Chronic right hemidiaphragm elevation.   Electronically Signed   By: Daryll Brod M.D.   On: 02/07/2014 08:56   Dg Lumbar Spine Complete  02/07/2014   CLINICAL DATA:  Fall last night while getting off of a bedside commode. Low back and left leg pain which has increased with difficulty moving the left leg. Initial encounter.  EXAM: LUMBAR SPINE - COMPLETE 4+ VIEW  COMPARISON:  10/05/2012  FINDINGS: There are 5 non rib-bearing lumbar type vertebral bodies. Mild lumbar dextroscoliosis is again seen, minimally more prominent than on the prior study. No definite listhesis or compression fracture is identified, however evaluation of the lower lumbar spine is mildly limited by obliquity on cross-table lateral radiographs due to scoliosis. Moderate to severe disc space  narrowing is present at L4-5 greater than L3-4, grossly similar to the prior study. Moderate multilevel osteophytosis is present. Atherosclerotic aortic calcification is noted. No definite pars defects are identified. 1.1 cm sclerotic focus in the supra-acetabular left ilium is unchanged and likely benign, possibly a bone island.  IMPRESSION: No acute osseous abnormality identified. Mild scoliosis and moderate multilevel spondylosis.   Electronically Signed   By: Logan Bores   On: 02/07/2014 09:00   Dg Hip Complete Left  02/07/2014   CLINICAL DATA:  Status post  fall Perma bedside commode last night. Left hip pain.  EXAM: LEFT HIP - COMPLETE 2+ VIEW  COMPARISON:  None.  FINDINGS: The patient has an acute fracture through the base of the left femoral neck. The femoral head is located. No other acute bony or joint abnormality is identified.  IMPRESSION: Acute basicervical left femoral neck fracture.   Electronically Signed   By: Inge Rise M.D.   On: 02/07/2014 08:53    ROS ROS: I have reviewed the patient's review of systems thoroughly and there are no positive responses as relates to the HPI. EXAM Blood pressure 147/59, pulse 57, temperature 99.8 F (37.7 C), temperature source Oral, resp. rate 20, SpO2 90.00%. Well-developed well-nourished patient in no acute distress. Alert and oriented x3 HEENT:within normal limits Cardiac: Regular rate and rhythm Pulmonary: Lungs clear to auscultation Abdomen: Soft and nontender.  Normal active bowel sounds  Musculoskeletal: (l hip painful rom.  Ext rotated and shortened   Physical Exam  Assessment/Plan: 68 yo male with fractured hip left side.  He has been evaluated and cleared by medicine for surgery today. Even with history of motor neuron disease pt will be better off with surgical intervention.   Dorsel Flinn L 02/07/2014, 5:38 PM

## 2014-02-07 NOTE — ED Notes (Signed)
Per EMS- Patient fell last night when trying to get off of a bedside commode. Patient c/o left leg pain last night. This AM, wife reports that the patient's leg pain has increased and that the patient has trouble moving his left leg. No shortening, rotation, deformity noted. Pain is worse with movement.

## 2014-02-07 NOTE — ED Provider Notes (Signed)
Medical screening examination/treatment/procedure(s) were conducted as a shared visit with non-physician practitioner(s) and myself.  I personally evaluated the patient during the encounter.   EKG Interpretation None      Patient presents following a fall with left leg pain.  Reports mechanical fall.  NV intact.  Xrays with left hip fractures.  Ortho consulted.  Patient to be admitted.  Shon Batonourtney F Jermany Sundell, MD 02/07/14 2002

## 2014-02-08 ENCOUNTER — Encounter (HOSPITAL_COMMUNITY): Payer: Self-pay | Admitting: Orthopedic Surgery

## 2014-02-08 DIAGNOSIS — G1220 Motor neuron disease, unspecified: Secondary | ICD-10-CM

## 2014-02-08 DIAGNOSIS — R1314 Dysphagia, pharyngoesophageal phase: Secondary | ICD-10-CM

## 2014-02-08 LAB — CBC
HCT: 33.3 % — ABNORMAL LOW (ref 39.0–52.0)
HEMOGLOBIN: 11.2 g/dL — AB (ref 13.0–17.0)
MCH: 30.4 pg (ref 26.0–34.0)
MCHC: 33.6 g/dL (ref 30.0–36.0)
MCV: 90.2 fL (ref 78.0–100.0)
Platelets: 210 10*3/uL (ref 150–400)
RBC: 3.69 MIL/uL — AB (ref 4.22–5.81)
RDW: 13.7 % (ref 11.5–15.5)
WBC: 9.4 10*3/uL (ref 4.0–10.5)

## 2014-02-08 LAB — BASIC METABOLIC PANEL
Anion gap: 10 (ref 5–15)
BUN: 10 mg/dL (ref 6–23)
CHLORIDE: 102 meq/L (ref 96–112)
CO2: 28 mEq/L (ref 19–32)
Calcium: 8.2 mg/dL — ABNORMAL LOW (ref 8.4–10.5)
Creatinine, Ser: 0.39 mg/dL — ABNORMAL LOW (ref 0.50–1.35)
GFR calc Af Amer: >90 mL/min (ref >90)
GFR calc non Af Amer: >90 mL/min (ref >90)
Glucose, Bld: 112 mg/dL — ABNORMAL HIGH (ref 70–99)
POTASSIUM: 3.9 meq/L (ref 3.7–5.3)
Sodium: 140 mEq/L (ref 137–147)

## 2014-02-08 MED ORDER — TRAMADOL HCL 50 MG PO TABS: 50.0000 mg | ORAL_TABLET | Freq: Four times a day (QID) | ORAL | Status: DC | PRN

## 2014-02-08 MED ORDER — FERROUS SULFATE 325 (65 FE) MG PO TABS
325.0000 mg | ORAL_TABLET | Freq: Two times a day (BID) | ORAL | Status: DC
  Administered 2014-02-08 – 2014-02-10 (×5): 325 mg via ORAL
  Filled 2014-02-08 (×7): qty 1

## 2014-02-08 MED ORDER — ALUM & MAG HYDROXIDE-SIMETH 200-200-20 MG/5ML PO SUSP: 30.0000 mL | ORAL | Status: DC | PRN

## 2014-02-08 MED ORDER — SODIUM CHLORIDE 0.9 % IV SOLN
INTRAVENOUS | Status: DC
  Administered 2014-02-09: 11:00:00 via INTRAVENOUS

## 2014-02-08 MED ORDER — ACETAMINOPHEN 650 MG RE SUPP
650.0000 mg | Freq: Four times a day (QID) | RECTAL | Status: DC | PRN
Start: 2014-02-08 — End: 2014-02-10

## 2014-02-08 MED ORDER — ASPIRIN EC 325 MG PO TBEC
325.0000 mg | DELAYED_RELEASE_TABLET | Freq: Every day | ORAL | Status: DC
  Administered 2014-02-08 – 2014-02-10 (×3): 325 mg via ORAL
  Filled 2014-02-08 (×4): qty 1

## 2014-02-08 MED ORDER — ONDANSETRON HCL 4 MG/2ML IJ SOLN
4.0000 mg | Freq: Four times a day (QID) | INTRAMUSCULAR | Status: DC | PRN
Start: 2014-02-08 — End: 2014-02-10

## 2014-02-08 MED ORDER — CEFAZOLIN SODIUM-DEXTROSE 2-3 GM-% IV SOLR
2.0000 g | Freq: Four times a day (QID) | INTRAVENOUS | Status: AC
  Administered 2014-02-08 (×2): 2 g via INTRAVENOUS
  Filled 2014-02-08 (×2): qty 50

## 2014-02-08 MED ORDER — HYDROMORPHONE HCL 1 MG/ML IJ SOLN
INTRAMUSCULAR | Status: AC
  Filled 2014-02-08: qty 1

## 2014-02-08 MED ORDER — ONDANSETRON HCL 4 MG PO TABS: 4.0000 mg | ORAL_TABLET | Freq: Four times a day (QID) | ORAL | Status: DC | PRN

## 2014-02-08 MED ORDER — DEXTROSE-NACL 5-0.45 % IV SOLN
INTRAVENOUS | Status: DC
  Administered 2014-02-08: 75 mL/h via INTRAVENOUS

## 2014-02-08 MED ORDER — ACETAMINOPHEN 325 MG PO TABS
650.0000 mg | ORAL_TABLET | Freq: Four times a day (QID) | ORAL | Status: DC | PRN
  Administered 2014-02-10: 650 mg via ORAL
  Filled 2014-02-08: qty 2

## 2014-02-08 MED ORDER — DOCUSATE SODIUM 100 MG PO CAPS
100.0000 mg | ORAL_CAPSULE | Freq: Two times a day (BID) | ORAL | Status: DC
  Administered 2014-02-08 – 2014-02-10 (×5): 100 mg via ORAL

## 2014-02-08 NOTE — Plan of Care (Signed)
Problem: Phase I Progression Outcomes Goal: Pre op pain controlled with appropriate interventions Outcome: Not Met (add Reason) Pain control better prior to surgery  Problem: Phase II Progression Outcomes Goal: CMS/Neurovascular status WDL Outcome: Completed/Met Date Met:  02/08/14 At baseline

## 2014-02-08 NOTE — Evaluation (Addendum)
Physical Therapy Evaluation Patient Details Name: Thomas Raymond MRN: [id number] DOB: 02/21/1946 Today's Date: 02/08/2014   History of Present Illness  Pt was admitted for L IM Nail for femur fx.  He is NWB and has a h/o ALS  Clinical Impression  Patient is able to communicate  Some, pt does  Report not wanting to go to a rehab. Patient will require 2 person assist initially, family/caregivers not present to discuss if 24/7 are available.     Follow Up Recommendations Home health PT;Supervision/Assistance - 24 hour    Equipment Recommendations  Hospital bed (sliding board,  ) Ramp, community resources     Recommendations for Other Services       Precautions / Restrictions Precautions Precautions: Fall Restrictions LLE Weight Bearing: Non weight bearing      Mobility  Bed Mobility Overal bed mobility: +2 for physical assistance       Supine to sit: +2 for physical assistance;Total assist Sit to supine: +2 for physical assistance;Total assist   General bed mobility comments: assist for legs and trunk  Transfers                 General transfer comment: not tested for safety: pt has weakness, decreased balance and NWB on LLE  Ambulation/Gait                Stairs            Wheelchair Mobility    Modified Rankin (Stroke Patients Only)       Balance Overall balance assessment: History of Falls;Needs assistance Sitting-balance support: Feet supported Sitting balance-Leahy Scale: Poor Sitting balance - Comments: min guard to mod A; pt relies heavily on arms for support, will list to r when not using UE's for support Postural control: Right lateral lean                                   Pertinent Vitals/Pain Pain Assessment: Faces Faces Pain Scale: Hurts little more Pain Location: L hip/thigh Pain Intervention(s): Limited activity within patient's tolerance;Monitored during session;Premedicated before session    Home Living  Family/patient expects to be discharged to:: Private residence Living Arrangements: Spouse/significant other;Children;Other relatives Available Help at Discharge: Family;Available PRN/intermittently Type of Home: House Home Access: Stairs to enter   Entrance Stairs-Number of Steps: 1or 2 Home Layout: One level Home Equipment: Walker - 2 wheels;Wheelchair - manual;Bedside commode      Prior Function Level of Independence: Needs assistance  Pt states he used WC when out of home, used Rw in side              Hand Dominance        Extremity/Trunk Assessment   Upper Extremity Assessment: Defer to OT evaluation           Lower Extremity Assessment: RLE deficits/detail RLE Deficits / Details: rossly 3+ hip and knee flexion LLE Deficits / Details: decreased knee flexion, assist to move the leg to edge and back onto bed.     Communication   Communication: Prefers language other than English ([place]ian)  Cognition Arousal/Alertness: Awake/alert Behavior During Therapy: WFL for tasks assessed/performed Overall Cognitive Status: Within Functional Limits for tasks assessed                      General Comments      Exercises        Assessment/Plan  PT Assessment Patient needs continued PT services  PT Diagnosis Difficulty walking;Generalized weakness;Acute pain   PT Problem List Decreased strength;Decreased range of motion;Decreased activity tolerance;Decreased balance;Decreased knowledge of use of DME;Decreased mobility;Decreased knowledge of precautions;Decreased safety awareness;Pain  PT Treatment Interventions DME instruction;Functional mobility training;Therapeutic activities;Patient/family education;Therapeutic exercise;Balance training   PT Goals (Current goals can be found in the Care Plan section) Acute Rehab PT Goals Patient Stated Goal: home PT Goal Formulation: With patient Time For Goal Achievement: 02/22/14 Potential to Achieve Goals:  Fair    Frequency Min 6X/week   Barriers to discharge Inaccessible home environment;Decreased caregiver support      Co-evaluation PT/OT/SLP Co-Evaluation/Treatment: Yes Reason for Co-Treatment: Complexity of the patient's impairments (multi-system involvement) PT goals addressed during session: Mobility/safety with mobility OT goals addressed during session: ADL's and self-care;Proper use of Adaptive equipment and DME       End of Session   Activity Tolerance: Patient limited by fatigue;Patient limited by pain Patient left: in bed;with call bell/phone within reach Nurse Communication: Mobility status         Time: [id number] PT Time Calculation (min): 20 min   Charges:   PT Evaluation $Initial PT Evaluation Tier I: 1 Procedure PT Treatments $Therapeutic Activity: 8-22 mins   PT G Codes:          Rada HayHill, Zula Hovsepian Elizabeth 02/08/2014, 5:28 PM Blanchard KelchKaren Destiny Trickey PT [phone]

## 2014-02-08 NOTE — Progress Notes (Addendum)
Patient spoke with camb[place]ian interpreter, Renae FicklePaul with [employer], over speaker phone with patient and Sports coachcase manager.  Explained to patient that the plan was to work with PT/OT to determine if patient could go home or needed to go to SNF.  Patient stated that he did not want to go to a SNF, and desires to go home.  Freddy JakschSarah Jeffries RN with CM present, [facility] with assistance of interpreter.  Will continue to monitor.

## 2014-02-08 NOTE — Evaluation (Signed)
Occupational Therapy Evaluation Patient Details Name: Thomas Raymond MRN: [id number] DOB: 05/05/1945 Today's Date: 02/08/2014    History of Present Illness Pt was admitted for L IM Nail for femur fx.  He is NWB and has a h/o ALS/motor neuron disease   Clinical Impression   This 68 year old man was admitted for the above.  He reports that he moves around with RW at baseline and is mostly mod I with ADLs.  Has significant other who will assist him.  At time of evaluation, pt is NWB and is weak, requiring assistance of 2 people.  He will benefit from skilled OT to increase safety and independence with adls and toilet transfers.    Follow Up Recommendations  Home health OT;Supervision/Assistance - 24 hour (pt refusing SNF) Pt states that girlfriend's daughter can also assist.  He will need A x 2 for OOB   Equipment Recommendations  Hospital bed (electric controls; drop arm commode and sliding board)    Recommendations for Other Services       Precautions / Restrictions Precautions Precautions: Fall Restrictions LLE Weight Bearing: Non weight bearing      Mobility Bed Mobility Overal bed mobility: +2 for physical assistance       Supine to sit: +2 for physical assistance;Total assist Sit to supine: +2 for physical assistance;Total assist   General bed mobility comments: assist for legs and trunk  Transfers                 General transfer comment: not tested for safety: pt has weakness, decreased balance and NWB on LLE    Balance Overall balance assessment: History of Falls;Needs assistance Sitting-balance support: Bilateral upper extremity supported (LLE supported by PT) Sitting balance-Leahy Scale: Poor Sitting balance - Comments: min guard to mod A; pt relies heavily on arms for support                                    ADL Overall ADL's : Needs assistance/impaired Eating/Feeding:  (not tested)   Grooming: Bed level;Set up;Minimal assistance                                  General ADL Comments: Pt seen in conjunction with PT.  He is unable to sit unsupported EOB.  UB ADLs are affected by position.  Pt has decreased strength in bil UEs and needs occasional min A.  Pt needs A x 2 for LB adls and these will need to be done from bed level.  Discussed using w/c due to NWB and possibly sliding board.  Will continue to evaluate on next visit.  He states he has been able to self-feed; arm/hand position are awkward.  Will trial AE to see if this makes self feeding less effortful.       Vision                     Perception     Praxis      Pertinent Vitals/Pain Pain Assessment: Faces Faces Pain Scale: Hurts little more Pain Location: L hip/thigh Pain Intervention(s): Limited activity within patient's tolerance;Monitored during session;Repositioned;Premedicated before session     Hand Dominance     Extremity/Trunk Assessment Upper Extremity Assessment Upper Extremity Assessment: Generalized weakness (grossly 3/5; grip 3+/5)           Communication  Communication Communication: Prefers language other than English ([place]ian; understands some [place])   Cognition Arousal/Alertness: Awake/alert Behavior During Therapy: WFL for tasks assessed/performed Overall Cognitive Status: Within Functional Limits for tasks assessed                     General Comments       Exercises       Shoulder Instructions      Home Living Family/patient expects to be discharged to:: Private residence Living Arrangements: Alone Available Help at Discharge: Family (girlfriend) Type of Home: House Home Access: Stairs to enter Secretary/administratorntrance Stairs-Number of Steps: 1             Bathroom Toilet: Standard     Home Equipment: Environmental consultantWalker - 2 wheels;Wheelchair - manual;Bedside commode          Prior Functioning/Environment Level of Independence: Needs assistance             OT Diagnosis: Generalized  weakness;Acute pain   OT Problem List: Decreased strength;Decreased activity tolerance;Impaired balance (sitting and/or standing);Decreased knowledge of use of DME or AE;Pain   OT Treatment/Interventions: Self-care/ADL training;DME and/or AE instruction;Balance training;Patient/family education;Therapeutic activities    OT Goals(Current goals can be found in the care plan section) Acute Rehab OT Goals Patient Stated Goal: home OT Goal Formulation: With patient Time For Goal Achievement: 02/15/14 Potential to Achieve Goals: Fair ADL Goals Pt Will Transfer to Toilet: with max assist;with +2 assist;with transfer board;bedside commode (drop arm) Additional ADL Goal #1: pt will roll with max A x 1 and bedrail for adls Additional ADL Goal #2: pt will demonstrate decreased effort with AE for self-feeding Additional ADL Goal #3: significant other will safely assist pt with ADLs/bedpan from bed level  OT Frequency: Min 2X/week   Barriers to D/C:            Co-evaluation PT/OT/SLP Co-Evaluation/Treatment: Yes Reason for Co-Treatment: Complexity of the patient's impairments (multi-system involvement);For patient/therapist safety PT goals addressed during session: Mobility/safety with mobility;Balance OT goals addressed during session: ADL's and self-care;Proper use of Adaptive equipment and DME      End of Session    Activity Tolerance: Patient limited by fatigue;Patient limited by pain Patient left: in bed;with call bell/phone within reach   Time: 1417-1437 OT Time Calculation (min): 20 min Charges:  OT General Charges $OT Visit: 1 Procedure OT Evaluation $Initial OT Evaluation Tier I: 1 Procedure G-Codes:    Presli Fanguy 02/08/2014, 3:11 PM Marica OtterMaryellen Kiyra Slaubaugh, OTR/L [phone] 02/08/2014

## 2014-02-08 NOTE — Progress Notes (Signed)
Clinical Social Work  Per chart review, patient refusing SNF and [facility]. CSW is signing off but available if further needs arise.  Unk LightningHolly Ellieana Dolecki, LCSW (Coverage for [employer])

## 2014-02-08 NOTE — Care Management Note (Addendum)
    Page 1 of 2   02/10/2014     10:59:37 AM CARE MANAGEMENT NOTE 02/10/2014  Patient:  Thomas Raymond, Thomas Raymond   Account Number:  [id number]  Date Initiated:  02/08/2014  Documentation initiated by:  [facility]  Subjective/Objective Assessment:   adm: IM Nail (Left)     Action/Plan:   discharge planning   Anticipated DC Date:  02/09/2014   Anticipated DC Plan:  [place]  CM consult      [facility] Choice  HOME HEALTH   Choice offered to / List presented to:  [name]   DME arranged  [place]      DME agency  [place] arranged  [place].   Status of service:  Completed, signed off Medicare Important Message given?   (If response is "NO", the following Medicare IM given date fields will be blank) Date Medicare IM given:   Medicare IM given by:   Date Additional Medicare IM given:   Additional Medicare IM given by:    Discharge Disposition:  [place]  Per UR Regulation:    If discussed at Long Length of Stay Meetings, dates discussed:    Comments:  02/10/14 08:15 Cm notes pt not discharged bc bed not delivered.  CM called [facility] rep who states order not entered correctly.  CM contacted RN for correct order.  [facility] rep unable to reach anyone at home for delivery.  CM gave [facility] rep, Cyril Mourning Interpretive number to arrange bed delivery. CSW aware of transport by non-emergency ambulance home once bed delivery is arranged between [facility] and pt.  No other CM needs were communicated.  Mariane Masters, BSN, Jearld Lesch [phone].  02/08/14 13:00 CM met pt in room (with RN present) to discuss disposition with the aid of the telephonic interpreter.  Pt speaks a [place] dialect.  Pt states he does not want to go to a rehab facility.  Pt states he has family at home with hime to help and wishes to go home with HHPT.  CM offers pt choice of home health agency  and pt chooses [facility] to render HHPT services.  Pt states he has a rolling walker, wheelchair, crutches, 3n1 at home.  Address and contact information verified with pt.  Referral called to [facility] rep, Cyril Mourning who is aware a telephonic iterpreter must be used for all communication.  CM waiting for HHPT orders.  Request has been made for HHPT orders.  Mariane Masters, BSN, CM [phone].

## 2014-02-08 NOTE — Evaluation (Signed)
Clinical/Bedside Swallow Evaluation Patient Details  Name: Thomas Raymond MRN: [id number] Date of Birth: 09/25/1945  Today's Date: 02/08/2014 Time: [id number] SLP Time Calculation (min): 43 min  Past Medical History:  Past Medical History  Diagnosis Date  . ALLERGIC RHINITIS   . ALS   . HYPERLIPIDEMIA   . HYPERTENSION   . VERTIGO   . LOW BACK PAIN   . Impaired glucose tolerance 02/17/2011   Past Surgical History:  Past Surgical History  Procedure Laterality Date  . Lipoma removal  2003  . Tracheostomy    . Percutaneous gastrostomy tube repalcement      04/1999 and removed 07/1999  . Compression hip screw Left 02/07/2014    Procedure: IM Nail;  Surgeon: Harvie JuniorJohn L Graves, MD;  Location: [facility];  Service: Orthopedics;  Laterality: Left;   HPI:  68 year old male with a history of motor neuron disease, hyperlipidemia, hypertension presented with a mechanical fall on the evening prior to admission. The patient had increasing pain through the night prompting him to come to the emergency department. The patient fell as he was getting up off the bed side commode getting back to his bed. He did not call anyone less than because he did not want to trouble anybody, but as his pancreas, he came to the emergency department on the day of admission. The patient denies any syncope. He denies any chest pain, shortness breath, nausea, vomiting, diarrhea, abdominal pain, dizziness. The patient has chronic lower extremity weakness secondary to his motor neuron disease. In addition he has chronic dysphagia secondary to his motor neuron disease. He sees a Advertising account executiveneuromuscular specialist, Dr. Nita Sickleonika Patel at [facility]. At baseline, the patient uses a walker to assist with ambulation.  Pt and family report pt ate a regular diet with thin liquids at home, and apparently has tolerated this, despite his chronic moderate oral, pharyngeal/cervical esophageal dysphagia.     Assessment / Plan / Recommendation Clinical  Impression  Pt has a chronic dysphagia, but appears to be tolerating a regular diet with thin liquids at home, per his report.  MBS of 06/09/13 was reviewed, and current symptoms/clinical signs concur with findings, however, now s/p hip fracture pt may not tolerate aspiration, as he will likely be less mobile, which may allow for consolidation in the lungs, that he previsouly had compensated for.  Pt exhibits his compensatory techniques with all consistencies assessed.  Pt uses a breath hold prior to sipping liquids and coughs after the swallow (supraglottic swallow technique).  He also intermittently "hocks" pureed bolus from the valleculae and reswallows.  Pt adamantly states his swallow has not worsened since last evaluated in February, and he does not wish to repeat the MBS.  Currently there is no pneumonia on the X-ray, and pt is afebrile, and lung sounds are clear.   SLP educated pt to s/s of aspiration pneumonia.  Will downgrade to dysphagia 2 diet while recovering from hip surgery.  Continue thin liquids with strict aspiration precautions and compensatory strategies.    Aspiration Risk  Moderate    Diet Recommendation Dysphagia 2 (Fine chop);Thin liquid   Liquid Administration via: Cup Medication Administration: Whole meds with puree Supervision: Staff to assist with self feeding;Full supervision/cueing for compensatory strategies Compensations: Slow rate;Small sips/bites;Multiple dry swallows after each bite/sip;Hard cough after swallow Postural Changes and/or Swallow Maneuvers: Seated upright 90 degrees;Upright 30-60 min after meal;Hold breath during swallow, followed by a cough (Super supraglottic swallow)    Other  Recommendations Oral Care Recommendations: Oral care  BID Other Recommendations: Clarify dietary restrictions   Follow Up Recommendations  Home health SLP;24 hour supervision/assistance    Frequency and Duration min 3x week  2 weeks   Pertinent Vitals/Pain Afebrile; LS  clear; CXR: no pneumonia or CHF         Swallow Study Prior Functional Status  Type of Home: House Available Help at Discharge: Family (girlfriend)    General HPI: 68 year old male with a history of motor neuron disease, hyperlipidemia, hypertension presented with a mechanical fall on the evening prior to admission. The patient had increasing pain through the night prompting him to come to the emergency department. The patient fell as he was getting up off the bed side commode getting back to his bed. He did not call anyone less than because he did not want to trouble anybody, but as his pancreas, he came to the emergency department on the day of admission. The patient denies any syncope. He denies any chest pain, shortness breath, nausea, vomiting, diarrhea, abdominal pain, dizziness. The patient has chronic lower extremity weakness secondary to his motor neuron disease. In addition he has chronic dysphagia secondary to his motor neuron disease. He sees a Advertising account executiveneuromuscular specialist, Dr. Nita Sickleonika Patel at [facility]. At baseline, the patient uses a walker to assist with ambulation.  Pt and family report pt ate a regular diet with thin liquids at home, and apparently has tolerated this, despite his chronic moderate oral, pharyngeal/cervical esophageal dysphagia.   Type of Study: Bedside swallow evaluation Previous Swallow Assessment: MBS 06/09/13 Dysphagia 3 with thin liquids recommended. Diet Prior to this Study: Regular;Thin liquids (per pt and family`) Temperature Spikes Noted: No Respiratory Status: Nasal cannula History of Recent Intubation: Yes Length of Intubations (days): 1 days (for surgery) Date extubated: 02/07/14 Behavior/Cognition: Alert;Cooperative Oral Cavity - Dentition: Adequate natural dentition Self-Feeding Abilities: Able to feed self;Needs set up Patient Positioning: Upright in bed Baseline Vocal Quality: Clear Volitional Cough: Strong Volitional Swallow: Able to  elicit    Oral/Motor/Sensory Function Overall Oral Motor/Sensory Function: Impaired at baseline Labial ROM: Reduced right;Reduced left Labial Strength: Reduced Lingual ROM: Other (Comment) (unable to elevate; fascicullations noted) Lingual Symmetry: Within Functional Limits Lingual Strength: Reduced Velum: Within Functional Limits Mandible: Within Functional Limits   Ice Chips Ice chips: Impaired Presentation: Spoon Pharyngeal Phase Impairments: Throat Clearing - Delayed   Thin Liquid Thin Liquid: Impaired Presentation: Cup Pharyngeal  Phase Impairments: Decreased hyoid-laryngeal movement;Multiple swallows;Throat Clearing - Delayed;Cough - Delayed    Nectar Thick Nectar Thick Liquid: Impaired Presentation: Cup Pharyngeal Phase Impairments: Decreased hyoid-laryngeal movement;Multiple swallows;Throat Clearing - Delayed;Cough - Delayed   Honey Thick Honey Thick Liquid: Not tested   Puree Puree: Impaired Presentation: Spoon Pharyngeal Phase Impairments: Decreased hyoid-laryngeal movement;Cough - Immediate ("Hocked" applesauce and swallowed again. "It's gone.")   Solid   GO    Solid: Impaired Presentation: Self Fed Pharyngeal Phase Impairments: Decreased hyoid-laryngeal movement;Multiple swallows       Teesha Ohm T 02/08/2014,4:28 PM

## 2014-02-08 NOTE — Op Note (Signed)
NAMBarbette Merino:  Dennen, Fard                   ACCOUNT NO.:  [id number]  MEDICAL RECORD NO.:  [id number]  LOCATION:  1618                         [facility]  PHYSICIAN:  Harvie JuniorJohn L. Hughes Wyndham, M.D.   DATE OF BIRTH:  May 27, 1945  DATE OF PROCEDURE:  02/07/2014 DATE OF DISCHARGE:                              OPERATIVE REPORT   PREOPERATIVE DIAGNOSIS:  Basicervical femoral neck fracture.  POSTOPERATIVE DIAGNOSIS:  Basicervical femoral neck fracture.  PROCEDURE:  Intramedullary rod fixation of basicervical neck fracture.  SURGEON:  Harvie JuniorJohn L. Woody Kronberg, M.D.  ASSISTANT:  Marshia LyJames Bethune, P.A.  ANESTHESIA:  General.  BRIEF HISTORY:  Mr. Rodena MedinSao Orlick is a 68 year old male with a history of having fallen while he was trying to get to the bathroom.  He was complaining of hip pain.  He was brought into the emergency room where x- ray showed he had a basicervical femoral neck fracture.  I initially was thinking about a compression hip screw for him, but ultimately it turned out he has a neuromuscular disease and is really amenable to non- ambulatory.  At that point, I felt that perhaps a less surgery with an intramedullary rod and a nail at the center of the head may be appropriate and so he was brought to the operating room for this procedure.  PROCEDURE:  The patient was brought to the operating room and after adequate anesthesia was obtained with general anesthetic, the patient was placed supine on the operating table.  He was then placed on the fracture table.  Care being taken in mobility.  At that point, x-ray showed that he had a nondisplaced basicervical fracture.  At this point, he was prepped and draped in usual sterile fashion.  Following this, a guidewire was passed to the lateral aspect of the femur and overdrilled to get into the femur.  The guidewire was advanced down to the knee, measured and a 12.5 reamer was passed down, got a little bit of chatter. At this point, the final nail was  advanced, an 11 mm x 340 mm nail, put it down in this position.  At this time, it looked like he had fallen to a tiny bit of varus and so at that point, we felt we were committed to this fixation technique, we put a derotational wire and advanced the wire up into the central part of the head in both AP and lateral fluoro and then advanced, twisted, did get a little bit of gap and we tried to compress this down with a compression technique and looked like it closed a tiny bit, but certainly did not close as nicely as we would like.  At that point, we got our final images, which looked good up at the hip and put it under fluoro and rotated and looked fine.  We went down distally and put a single distal lock with a freehand technique.  Irrigated and closed the wounds and he was taken to recovery room and was noted to be in a satisfactory condition.  Estimated blood loss for the procedure was minimal.     Harvie JuniorJohn L. Grayson White, M.D.     Ranae PlumberJLG/MEDQ  D:  02/07/2014  T:  02/08/2014  Job:  [id number]

## 2014-02-08 NOTE — Progress Notes (Signed)
Subjective: 1 Day Post-Op Procedure(s) (LRB): IM Nail (Left) Patient reports pain as mild.    Objective: Vital signs in last 24 hours: Temp:  [98 F (36.7 C)-99.8 F (37.7 C)] 99.8 F (37.7 C) (10/20 1009) Pulse Rate:  [45-63] 63 (10/20 1009) Resp:  [8-20] 16 (10/20 1009) BP: (105-164)/(52-112) 147/61 mmHg (10/20 1009) SpO2:  [92 %-99 %] 92 % (10/20 1009) Weight:  [160 lb (72.576 kg)] 160 lb (72.576 kg) (10/19 1703)  Intake/Output from previous day: 10/19 0701 - 10/20 0700 In: 1921.3 [P.O.:90; I.V.:1781.3; IV Piggyback:50] Out: 745 [Urine:620; Blood:125] Intake/Output this shift: Total I/O In: 240 [P.O.:240] Out: 575 [Urine:575]   Recent Labs  02/07/14 0935 02/08/14 0144  HGB 13.2 11.2*    Recent Labs  02/07/14 0935 02/08/14 0144  WBC 9.9 9.4  RBC 4.54 3.69*  HCT 40.3 33.3*  PLT 309 210    Recent Labs  02/07/14 1107 02/08/14 0144  NA 139 140  K 4.3 3.9  CL 103 102  CO2 27 28  BUN 10 10  CREATININE 0.32* 0.39*  GLUCOSE 109* 112*  CALCIUM 8.8 8.2*    Recent Labs  02/07/14 0935  INR 0.87    Neurologically intact ABD soft Neurovascular intact No cellulitis present Compartment soft  Assessment/Plan: 1 Day Post-Op Procedure(s) (LRB): IM Nail (Left) Advance diet Up with therapyNone or touchdown weightbearing only on the left side.  Patient can transfer, sit up in bed, sit up in a chair without restriction. I had a long conversation with the patient and his nurse today.  There appears to be some language issues.  I asked the nurse later in the day to call the [place] translator and see if we can't make sure of what the patient's intentions are in terms of skilled nursing facility versus trying to go home.  If he is trying to go home we need to be certain that we feel that this can be done safely.  I'm not certain that in my long conversation with the patient and family today that they were completely understanding of that.  Thomas Raymond  L 02/08/2014, 11:24 AM

## 2014-02-08 NOTE — Progress Notes (Signed)
[facility]                                                                              Patient Demographics  Thomas Raymond, is a 68 y.o. male, DOB - 06/03/1945, WUJ:[id number]  Admit date - 02/07/2014   Admitting Physician Harvie JuniorJohn L Graves, MD  Outpatient Primary MD for the patient is Oliver BarreJames [name], MD  LOS - 1   Chief Complaint  Patient presents with  . Fall  . Leg Pain      HPI on 02/07/2014 68 year old male with a history of motor neuron disease, hyperlipidemia, hypertension presented with a mechanical fall on the evening prior to admission. The patient had increasing pain through the night prompting him to come to the emergency department. The patient fell as he was getting up off the bed side commode getting back to his bed. He did not call anyone because he did not want to trouble anybody.  The patient denied any syncope. He denied any chest pain, shortness breath, nausea, vomiting, diarrhea, abdominal pain, dizziness. The patient has chronic lower extremity weakness secondary to his motor neuron disease. In addition he has chronic dysphagia secondary to his motor neuron disease. He sees a Advertising account executiveneuromuscular specialist, Dr. Nita Sickleonika Patel at [facility]. At baseline, the patient uses a walker to assist with ambulation.  In the emergency department, CBC and BMP were unremarkable. His enzymes were unremarkable. EKG showed sinus rhythm with incomplete RBBB, but this was unchanged from previous EKGs. Chest x-ray was negative for any infiltrates. X-ray of the right leg showed a left femoral neck fracture.   Assessment & Plan   Left femoral neck fracture  -Orthopedics consulted and appreciated -POD 1: Intramedullary rod fixation of basicervical neck fracture, by Dr. Luiz BlareGraves -Continue pain control as needed -Pending PT and OT consultations -discontinue foley catheter -continue incentive spirometry  Motor neuron disease with Dysphagia  -speech therapy eval  -PT/OT  consulted  Hypertension  -Continue atenolol  -hold Lotrel due to relatively soft BP   Hyperlipidemia  -Continue statin   Chronic leg pain  -Continue Lyrica  Code Status: Full  Family Communication: Wife at bedside  Disposition Plan: Admitted  Time Spent in minutes   30 minutes  Procedures  Intramedullary rod fixation of basicervical neck fracture.  Consults   Orthopedic surgery  DVT Prophylaxis  SCDs  Lab Results  Component Value Date   PLT 210 02/08/2014    Medications  Scheduled Meds: . aspirin EC  325 mg Oral Q breakfast  . atenolol  50 mg Oral QHS  . docusate sodium  100 mg Oral BID  . ferrous sulfate  325 mg Oral BID WC  . Influenza vac split quadrivalent PF  0.5 mL Intramuscular Tomorrow-1000  . pantoprazole  40 mg Oral Daily  . pneumococcal 23 valent vaccine  0.5 mL Intramuscular Tomorrow-1000  . pregabalin  75 mg Oral BID  . rosuvastatin  20 mg Oral Daily   Continuous Infusions: . sodium chloride    . dextrose 5 % and 0.45% NaCl 75 mL/hr (02/08/14 0116)   PRN Meds:.acetaminophen, acetaminophen, alum & mag hydroxide-simeth, HYDROcodone-acetaminophen, morphine injection, ondansetron (ZOFRAN) IV, ondansetron, traMADol  Antibiotics  Anti-infectives   Start     Dose/Rate Route Frequency Ordered Stop   02/08/14 0600  ceFAZolin (ANCEF) IVPB 2 g/50 mL premix     2 g 100 mL/hr over 30 Minutes Intravenous Every 6 hours 02/08/14 0054 02/08/14 1327      Subjective:   Thomas Raymond seen and examined today.  States he still has pain his left hip.  He also complains of pain from the foley catheter. Patient denies chest pain, SOB, headache, dizziness, abominal pain.  Objective:   Filed Vitals:   02/08/14 [id number] 02/08/14 0628 02/08/14 0655 02/08/14 1009  BP:    147/61  Pulse: 54 48 56 63  Temp:    99.8 F (37.7 C)  TempSrc:      Resp: 16 14 16 16   Height:      Weight:      SpO2: 98% 98% 98% 92%    Wt Readings from Last 3 Encounters:  02/07/14  72.576 kg (160 lb)  02/07/14 72.576 kg (160 lb)  01/17/14 70.761 kg (156 lb)     Intake/Output Summary (Last 24 hours) at 02/08/14 1351 Last data filed at 02/08/14 1012  Gross per 24 hour  Intake 2161.25 ml  Output   1100 ml  Net 1061.25 ml    Exam  General: Well developed, well nourished, NAD, appears stated age  HEENT: NCAT, mucous membranes moist.   Cardiovascular: S1 S2 auscultated, no rubs, murmurs or gallops. Regular rate and rhythm.  Respiratory: Clear to auscultation bilaterally with equal chest rise  Abdomen: Soft, nontender, nondistended, + bowel sounds  Extremities: warm dry without cyanosis clubbing or edema.  Bandage on left hip, clean.  Data Review   Micro Results Recent Results (from the past 240 hour(s))  SURGICAL PCR SCREEN     Status: None   Collection Time    02/07/14  2:32 PM      Result Value Ref Range Status   MRSA, PCR NEGATIVE  NEGATIVE Final   Staphylococcus aureus NEGATIVE  NEGATIVE Final   Comment:            The Xpert SA Assay (FDA     approved for NASAL specimens     in patients over 69 years of age),     is one component of     a comprehensive surveillance     program.  Test performance has     been validated by [facility] for patients greater     than or equal to 69 year old.     It is not intended     to diagnose infection nor to     guide or monitor treatment.    Radiology Reports Dg Chest 1 View  02/07/2014   CLINICAL DATA:  Fall, left hip fracture  EXAM: CHEST - 1 VIEW  COMPARISON:  06/07/2013  FINDINGS: Stable cardiomegaly with mild vascular congestion. Right hemidiaphragm remains elevated as before. No superimposed CHF or pneumonia. Negative for effusion or pneumothorax. Trachea midline. Postop changes in the right supraclavicular region. Atherosclerosis of the aorta noted.  IMPRESSION: Stable cardiomegaly without CHF or pneumonia.  Chronic right hemidiaphragm elevation.   Electronically Signed   By: Ruel Favorsrevor  Shick M.D.    On: 02/07/2014 08:56   Dg Lumbar Spine Complete  02/07/2014   CLINICAL DATA:  Fall last night while getting off of a bedside commode. Low back and left leg pain which has increased with difficulty moving the left leg. Initial encounter.  EXAM: LUMBAR SPINE - COMPLETE 4+ VIEW  COMPARISON:  10/05/2012  FINDINGS: There are 5 non rib-bearing lumbar type vertebral bodies. Mild lumbar dextroscoliosis is again seen, minimally more prominent than on the prior study. No definite listhesis or compression fracture is identified, however evaluation of the lower lumbar spine is mildly limited by obliquity on cross-table lateral radiographs due to scoliosis. Moderate to severe disc space narrowing is present at L4-5 greater than L3-4, grossly similar to the prior study. Moderate multilevel osteophytosis is present. Atherosclerotic aortic calcification is noted. No definite pars defects are identified. 1.1 cm sclerotic focus in the supra-acetabular left ilium is unchanged and likely benign, possibly a bone island.  IMPRESSION: No acute osseous abnormality identified. Mild scoliosis and moderate multilevel spondylosis.   Electronically Signed   By: Sebastian Ache   On: 02/07/2014 09:00   Dg Hip Complete Left  02/07/2014   CLINICAL DATA:  Status post fall Perma bedside commode last night. Left hip pain.  EXAM: LEFT HIP - COMPLETE 2+ VIEW  COMPARISON:  None.  FINDINGS: The patient has an acute fracture through the base of the left femoral neck. The femoral head is located. No other acute bony or joint abnormality is identified.  IMPRESSION: Acute basicervical left femoral neck fracture.   Electronically Signed   By: Drusilla Kanner M.D.   On: 02/07/2014 08:53   X-ray Femur Left Ap And Lateral  02/08/2014   CLINICAL DATA:  Left hip fracture, ORIF.  EXAM: DG C-ARM 1-60 MIN - NRPT [facility]; LEFT FEMUR - 2 VIEW  COMPARISON:  None.  FINDINGS: Intraoperative fluoroscopy is utilized for surgical control purposes. Fluoroscopy time is  reported at 1.5 min.  Spot fluoroscopic images of the left hip demonstrate internal fixation of a fracture of the left femoral neck using intra medullary rod with distal locking screw in proximal compression bolt. Near-anatomic alignment is demonstrated.  IMPRESSION: Intraoperative fluoroscopy obtained, demonstrating internal fixation of left femoral neck fracture.   Electronically Signed   By: Burman Nieves M.D.   On: 02/08/2014 00:02   Dg C-arm 1-60 Min-no Report  02/08/2014   CLINICAL DATA:  Left hip fracture, ORIF.  EXAM: DG C-ARM 1-60 MIN - [facility]PT [facility]; LEFT FEMUR - 2 VIEW  COMPARISON:  None.  FINDINGS: Intraoperative fluoroscopy is utilized for surgical control purposes. Fluoroscopy time is reported at 1.5 min.  Spot fluoroscopic images of the left hip demonstrate internal fixation of a fracture of the left femoral neck using intra medullary rod with distal locking screw in proximal compression bolt. Near-anatomic alignment is demonstrated.  IMPRESSION: Intraoperative fluoroscopy obtained, demonstrating internal fixation of left femoral neck fracture.   Electronically Signed   By: Burman Nieves M.D.   On: 02/08/2014 00:02    CBC  Recent Labs Lab 02/07/14 0935 02/08/14 0144  WBC 9.9 9.4  HGB 13.2 11.2*  HCT 40.3 33.3*  PLT 309 210  MCV 88.8 90.2  MCH 29.1 30.4  MCHC 32.8 33.6  RDW 13.7 13.7  LYMPHSABS 1.5  --   MONOABS 0.7  --   EOSABS 0.2  --   BASOSABS 0.0  --     Chemistries   Recent Labs Lab 02/07/14 1107 02/08/14 0144  NA 139 140  K 4.3 3.9  CL 103 102  CO2 27 28  GLUCOSE 109* 112*  BUN 10 10  CREATININE 0.32* 0.39*  CALCIUM 8.8 8.2*  AST 34  --   ALT 31  --   ALKPHOS 72  --  BILITOT 0.5  --    ------------------------------------------------------------------------------------------------------------------ estimated creatinine clearance is 75.8 ml/min (by C-G formula based on Cr of  0.39). ------------------------------------------------------------------------------------------------------------------ No results found for this basename: HGBA1C,  in the last 72 hours ------------------------------------------------------------------------------------------------------------------ No results found for this basename: CHOL, HDL, LDLCALC, TRIG, CHOLHDL, LDLDIRECT,  in the last 72 hours ------------------------------------------------------------------------------------------------------------------ No results found for this basename: TSH, T4TOTAL, FREET3, T3FREE, THYROIDAB,  in the last 72 hours ------------------------------------------------------------------------------------------------------------------ No results found for this basename: VITAMINB12, FOLATE, FERRITIN, TIBC, IRON, RETICCTPCT,  in the last 72 hours  Coagulation profile  Recent Labs Lab 02/07/14 0935  INR 0.87    No results found for this basename: DDIMER,  in the last 72 hours  Cardiac Enzymes No results found for this basename: CK, CKMB, TROPONINI, MYOGLOBIN,  in the last 168 hours ------------------------------------------------------------------------------------------------------------------ No components found with this basename: POCBNP,     Jhamari Markowicz D.O. on 02/08/2014 at 1:51 PM  Between 7am to 7pm - Pager - [phone]  After 7pm go to [email or web address] - password [email or web address]  And look for the night coverage person covering for me after hours  [facility] Office  [phone]

## 2014-02-09 LAB — CBC
HEMATOCRIT: 33.6 % — AB (ref 39.0–52.0)
Hemoglobin: 10.8 g/dL — ABNORMAL LOW (ref 13.0–17.0)
MCH: 28.8 pg (ref 26.0–34.0)
MCHC: 32.1 g/dL (ref 30.0–36.0)
MCV: 89.6 fL (ref 78.0–100.0)
Platelets: 195 10*3/uL (ref 150–400)
RBC: 3.75 MIL/uL — ABNORMAL LOW (ref 4.22–5.81)
RDW: 13.5 % (ref 11.5–15.5)
WBC: 7.4 10*3/uL (ref 4.0–10.5)

## 2014-02-09 LAB — BASIC METABOLIC PANEL
Anion gap: 11 (ref 5–15)
BUN: 7 mg/dL (ref 6–23)
CHLORIDE: 102 meq/L (ref 96–112)
CO2: 28 mEq/L (ref 19–32)
CREATININE: 0.37 mg/dL — AB (ref 0.50–1.35)
Calcium: 8.7 mg/dL (ref 8.4–10.5)
GFR calc Af Amer: >90 mL/min (ref >90)
GFR calc non Af Amer: >90 mL/min (ref >90)
GLUCOSE: 126 mg/dL — AB (ref 70–99)
Potassium: 4.3 mEq/L (ref 3.7–5.3)
Sodium: 141 mEq/L (ref 137–147)

## 2014-02-09 MED ORDER — FERROUS SULFATE 325 (65 FE) MG PO TABS
325.0000 mg | ORAL_TABLET | Freq: Two times a day (BID) | ORAL | Status: DC
Start: 2014-02-09 — End: 2014-05-19

## 2014-02-09 NOTE — Progress Notes (Signed)
SLP  Note  Patient Details Name: Rodena MedinSao Dimitri MRN: [id number] DOB: 07/23/1945   Pt for D/C this afternoon.  Independent with swallowing strategies - may resume his regular diet upon return home.  Educated pt.  No further SLP needs.          Blenda MountsCouture, Bettejane Leavens Laurice 02/09/2014, 1:11 PM

## 2014-02-09 NOTE — Progress Notes (Addendum)
Subjective: 2 Days Post-Op Procedure(s) (LRB): IM Nail (Left) Patient reports pain as mild.  Getting PT this afternoon.  Slow progress this am.  Objective: Vital signs in last 24 hours: Temp:  [98.7 F (37.1 C)-99.7 F (37.6 C)] 98.7 F (37.1 C) (10/21 1355) Pulse Rate:  [63-82] 63 (10/21 1355) Resp:  [16-18] 18 (10/21 1355) BP: (142-153)/(64-108) 153/108 mmHg (10/21 1355) SpO2:  [95 %-96 %] 96 % (10/21 1355)  Intake/Output from previous day: 10/20 0701 - 10/21 0700 In: 720 [P.O.:720] Out: 825 [Urine:825] Intake/Output this shift: Total I/O In: 480 [P.O.:480] Out: 500 [Urine:500]   Recent Labs  02/07/14 0935 02/08/14 0144 02/09/14 0428  HGB 13.2 11.2* 10.8*    Recent Labs  02/08/14 0144 02/09/14 0428  WBC 9.4 7.4  RBC 3.69* 3.75*  HCT 33.3* 33.6*  PLT 210 195    Recent Labs  02/08/14 0144 02/09/14 0428  NA 140 141  K 3.9 4.3  CL 102 102  CO2 28 28  BUN 10 7  CREATININE 0.39* 0.37*  GLUCOSE 112* 126*  CALCIUM 8.2* 8.7    Recent Labs  02/07/14 0935  INR 0.87  Left lower ext exam:  Neurovascular intact Sensation intact distally Intact pulses distally Dorsiflexion/Plantar flexion intact Incision: dressing C/D/I Compartment soft  Assessment/Plan: 2 Days Post-Op Procedure(s) (LRB): IM Nail (Left) Plan; Discharge home with home health May TDWB on Left with walker. ASA 325mg  daily with food x 1 month for DVT prophylaxis. Norco 5mg  prn pain  F/U with Dr Luiz BlareGraves in 2 weeks.  Larhonda Dettloff G 02/09/2014, 2:16 PM

## 2014-02-09 NOTE — Discharge Instructions (Signed)
Hip Fracture °A hip fracture is a fracture of the upper part of your thigh bone (femur).  °CAUSES °A hip fracture is caused by a direct blow to the side of your hip. This is usually the result of a fall but can occur in other circumstances, such as an automobile accident. °RISK FACTORS °There is an increased risk of hip fractures in people with: °· An unsteady walking pattern (gait) and those with conditions that contribute to poor balance, such as Parkinson's disease or dementia. °· Osteopenia and osteoporosis. °· Cancer that spreads to the leg bones. °· Certain metabolic diseases. °SYMPTOMS  °Symptoms of hip fracture include: °· Pain over the injured hip. °· Inability to put weight on the leg in which the fracture occurred (although, some patients are able to walk after a hip fracture). °· Toes and foot of the affected leg point outward when you lie down. °DIAGNOSIS °A physical exam can determine if a hip fracture is likely to have occurred. X-ray exams are needed to confirm the fracture and to look for other injuries. The X-ray exam can help to determine the type of hip fracture. Rarely, the fracture is not visible on an X-ray image and a CT scan or MRI will have to be done. °TREATMENT  °The treatment for a fracture is usually surgery. This means using a screw, nail, or rod to hold the bones in place.  °HOME CARE INSTRUCTIONS °Take all medicines as directed by your health care provider. °SEEK MEDICAL CARE IF: °Pain continues, even after taking pain medicine. °MAKE SURE YOU: °· Understand these instructions.   °· Will watch your condition. °· Will get help right away if you are not doing well or get worse. °Document Released: 04/08/2005 Document Revised: 04/13/2013 Document Reviewed: 11/18/2012 °ExitCare® Patient Information ©2015 [employer]. This information is not intended to replace advice given to you by your health care provider. Make sure you discuss any questions you have with your health care  provider. ° °

## 2014-02-09 NOTE — Progress Notes (Signed)
Occupational Therapy Treatment Patient Details Name: Thomas Raymond MRN: [id number] DOB: 01/03/1946 Today's Date: 02/09/2014    History of present illness Pt was admitted for L IM Nail for femur fx.  He is NWB and has a h/o ALS   OT comments  Pt very motivated.  Used interpreter line for girlfriend.  Also had interpreter reinforce to pt that they should not get OOB until therapist at home trains them due to high falls risk.  Follow Up Recommendations  Home health OT;Supervision/Assistance - 24 hour    Equipment Recommendations  Hospital bed (electric and sliding board)    Recommendations for Other Services      Precautions / Restrictions Precautions Precautions: Fall Restrictions LLE Weight Bearing: Touchdown weight bearing       Mobility Bed Mobility         Supine to sit: +2 for physical assistance;Total assist Sit to supine: +2 for physical assistance;Total assist   General bed mobility comments: girlfriend assisted with LLE for EOB  Transfers Overall transfer level: Needs assistance Equipment used: None (sliding board) Transfers: Lateral/Scoot Transfers           General transfer comment: used sliding board:  see adls above    Balance Overall balance assessment: History of Falls Sitting-balance support: Bilateral upper extremity supported Sitting balance-Leahy Scale: Poor Sitting balance - Comments: min guard to mod A; pt relies heavily on arms for support, will list to r when not using UE's for support                           ADL Overall ADL's : Needs assistance/impaired                         Toilet Transfer: Maximal assistance (bedpan)             General ADL Comments: Pt seen with PT.  Used interpreter line to communicate with girlfriend.  She assisted to roll for bedpan and will do this for LB adls.  She has assisted him before; emphasized NWB.  Performed sliding board transfer to w/c and back to bed.  Emphasized that they  will not do this without therapy staff training them and then with 2 caregivers.  Pt lacks trunk control and is not ready for drop arm commode at this time.        Vision                     Perception     Praxis      Cognition   Behavior During Therapy: WFL for tasks assessed/performed Overall Cognitive Status: Within Functional Limits for tasks assessed                       Extremity/Trunk Assessment               Exercises     Shoulder Instructions       General Comments      Pertinent Vitals/ Pain       Pain Assessment: Faces Pain Score: 8  Faces Pain Scale: Hurts whole lot Pain Location: L hip/thigh Pain Intervention(s): Patient requesting pain meds-RN notified;Ice applied;Repositioned;Monitored during session  Home Living  Prior Functioning/Environment              Frequency Min 2X/week     Progress Toward Goals  OT Goals(current goals can now be found in the care plan section)  Progress towards OT goals: Progressing toward goals     Plan      Co-evaluation    PT/OT/SLP Co-Evaluation/Treatment: Yes Reason for Co-Treatment: Complexity of the patient's impairments (multi-system involvement);For patient/therapist safety PT goals addressed during session: Mobility/safety with mobility OT goals addressed during session: ADL's and self-care      End of Session     Activity Tolerance Patient limited by fatigue;Patient limited by pain   Patient Left in bed;with call bell/phone within reach   Nurse Communication          Time: 9604-54091513-1605 OT Time Calculation (min): 52 min  Charges: OT General Charges $OT Visit: 1 Procedure OT Treatments $Self Care/Home Management : 8-22 mins  Davonna Ertl 02/09/2014, 4:24 PM   Marica OtterMaryellen Jerrico Covello, OTR/L [phone] 02/09/2014

## 2014-02-09 NOTE — Discharge Summary (Signed)
Physician Discharge Summary  McDowell OZH:[id number] DOB: [birth year] DOA: 02/07/2014  PCP: Oliver Barre, MD  Admit date: 02/07/2014 Discharge date: 02/09/2014  Time spent: 45 minutes  Recommendations for Outpatient Follow-up:  Patient will be discharged to home with home health physical therapy as well as occupational therapy. Patient will need to follow up with his primary care physician within one to 2 weeks of discharge as well as Dr. Luiz Blare, orthopedic surgeon. Patient should resume a dysphagia 2 diet.  Patient should continue taking his medications as prescribed.  Discharge Diagnoses:  Left femoral neck fracture Other bone disease with dysphasia Hypertension Hyperlipidemia Chronic leg pain  Discharge Condition: Stable  Diet recommendation: Dysphagia 2  Filed Weights   02/07/14 1703  Weight: 72.576 kg (160 lb)    History of present illness:  on 02/07/2014  68 year old male with a history of motor neuron disease, hyperlipidemia, hypertension presented with a mechanical fall on the evening prior to admission. The patient had increasing pain through the night prompting him to come to the emergency department. The patient fell as he was getting up off the bed side commode getting back to his bed. He did not call anyone because he did not want to trouble anybody. The patient denied any syncope. He denied any chest pain, shortness breath, nausea, vomiting, diarrhea, abdominal pain, dizziness. The patient has chronic lower extremity weakness secondary to his motor neuron disease. In addition he has chronic dysphagia secondary to his motor neuron disease. He sees a Advertising account executive, Dr. Nita Sickle at [facility]. At baseline, the patient uses a walker to assist with ambulation. In the emergency department, CBC and BMP were unremarkable. His enzymes were unremarkable. EKG showed sinus rhythm with incomplete RBBB, but this was unchanged from previous EKGs. Chest x-ray was  negative for any infiltrates. X-ray of the right leg showed a left femoral neck fracture.  Hospital Course:  Left femoral neck fracture  -Orthopedics consulted and appreciated  -POD 2: Intramedullary rod fixation of basicervical neck fracture, by Dr. Luiz Blare  -Continue pain control as needed  -PT and OT consulted and recommended home health -continue incentive spirometry  -Patient will need to followup with Dr. Luiz Blare in 2 weeks.  Motor neuron disease with Dysphagia  -speech therapy eval recommended dysphagia 2 diet -PT/OT consulted and recommended Home health  Hypertension  -Continue atenolol  -Lotrel was held due to relatively soft BP, but patient may resume at discharge  Hyperlipidemia  -Continue statin   Chronic leg pain  -Continue Lyrica   Procedures  Intramedullary rod fixation of basicervical neck fracture.   Consults  Orthopedic surgery  Discharge Exam: Filed Vitals:   02/09/14 0539  BP: 142/66  Pulse: 82  Temp: 99.7 F (37.6 C)  Resp: 16   Exam  General: Well developed, well nourished, NAD, appears stated age  HEENT: NCAT, mucous membranes moist.  Cardiovascular: S1 S2 auscultated, no rubs, murmurs or gallops. Regular rate and rhythm.  Respiratory: Clear to auscultation bilaterally with equal chest rise  Abdomen: Soft, nontender, nondistended, + bowel sounds  Extremities: warm dry without cyanosis clubbing or edema. Bandage on left hip, clean.  Discharge Instructions      Discharge Instructions   Discharge instructions    Complete by:  As directed   Patient will be discharged to home with home health physical therapy as well as occupational therapy. Patient will need to follow up with his primary care physician within one to 2 weeks of discharge as well as Dr. Luiz Blare,  orthopedic surgeon. Patient should resume a dysphagia 2 diet.  Patient should continue taking his medications as prescribed.     Non weight bearing    Complete by:  As directed    Laterality:  left  Extremity:  Lower  Bed to chair   Pt is a non ambulator.    X 6 weeks            Medication List         amLODipine-benazepril 10-40 MG per capsule  Commonly known as:  LOTREL  Take 1 capsule by mouth daily.     aspirin EC 325 MG tablet  Take 1 tablet (325 mg total) by mouth daily.     atenolol 50 MG tablet  Commonly known as:  TENORMIN  Take 50 mg by mouth at bedtime.     cetirizine 10 MG tablet  Commonly known as:  ZYRTEC  Take 1 tablet (10 mg total) by mouth daily.     ferrous sulfate 325 (65 FE) MG tablet  Take 1 tablet (325 mg total) by mouth 2 (two) times daily with a meal.     HYDROcodone-acetaminophen 5-325 MG per tablet  Commonly known as:  NORCO  Take 1 tablet by mouth every 6 (six) hours as needed for moderate pain.     meloxicam 7.5 MG tablet  Commonly known as:  MOBIC  Take 7.5 mg by mouth daily.     omeprazole 20 MG capsule  Commonly known as:  PRILOSEC  Take 20 mg by mouth daily.     pregabalin 75 MG capsule  Commonly known as:  LYRICA  Take 1 capsule (75 mg total) by mouth 2 (two) times daily.     rosuvastatin 20 MG tablet  Commonly known as:  CRESTOR  Take 1 tablet (20 mg total) by mouth daily.     traMADol 50 MG tablet  Commonly known as:  ULTRAM  Take by mouth every 6 (six) hours as needed.       No Known Allergies Follow-up Information   Follow up with GRAVES,[name] L, MD. Schedule an appointment as soon as possible for a visit in 2 weeks.   Specialty:  Orthopedic Surgery   Contact information:   [address]iv[address] Spence[address] [place] [address] [phone]       Follow up with [facility]. (home health physical therapy)    Contact information:   [address] [place] [address] [phone]       Follow up with Oliver BarreJames [name], MD. Schedule an appointment as soon as possible for a visit in 1 week. [facility](Hospital followup)    Specialties:  Internal Medicine, Radiology   Contact information:   [address] [facility] [place] [address] [phone]        The results of significant diagnostics from this hospitalization (including imaging, microbiology, ancillary and laboratory) are listed below for reference.    Significant Diagnostic Studies: Dg Chest 1 View  02/07/2014   CLINICAL DATA:  Fall, left hip fracture  EXAM: CHEST - 1 VIEW  COMPARISON:  06/07/2013  FINDINGS: Stable cardiomegaly with mild vascular congestion. Right hemidiaphragm remains elevated as before. No superimposed CHF or pneumonia. Negative for effusion or pneumothorax. Trachea midline. Postop changes in the right supraclavicular region. Atherosclerosis of the aorta noted.  IMPRESSION: Stable cardiomegaly without CHF or pneumonia.  Chronic right hemidiaphragm elevation.   Electronically Signed   By: Ruel Favorsrevor  Shick M.D.   On: 02/07/2014 08:56   Dg  Lumbar Spine Complete  02/07/2014   CLINICAL DATA:  Fall last night while getting off of a bedside commode. Low back and left leg pain which has increased with difficulty moving the left leg. Initial encounter.  EXAM: LUMBAR SPINE - COMPLETE 4+ VIEW  COMPARISON:  10/05/2012  FINDINGS: There are 5 non rib-bearing lumbar type vertebral bodies. Mild lumbar dextroscoliosis is again seen, minimally more prominent than on the prior study. No definite listhesis or compression fracture is identified, however evaluation of the lower lumbar spine is mildly limited by obliquity on cross-table lateral radiographs due to scoliosis. Moderate to severe disc space narrowing is present at L4-5 greater than L3-4, grossly similar to the prior study. Moderate multilevel osteophytosis is present. Atherosclerotic aortic calcification is noted. No definite pars defects are identified. 1.1 cm sclerotic focus in the supra-acetabular left ilium is unchanged and likely benign, possibly a bone island.  IMPRESSION: No acute osseous abnormality identified. Mild scoliosis and moderate multilevel spondylosis.    Electronically Signed   By: Sebastian Ache   On: 02/07/2014 09:00   Dg Hip Complete Left  02/07/2014   CLINICAL DATA:  Status post fall Perma bedside commode last night. Left hip pain.  EXAM: LEFT HIP - COMPLETE 2+ VIEW  COMPARISON:  None.  FINDINGS: The patient has an acute fracture through the base of the left femoral neck. The femoral head is located. No other acute bony or joint abnormality is identified.  IMPRESSION: Acute basicervical left femoral neck fracture.   Electronically Signed   By: Drusilla Kanner M.D.   On: 02/07/2014 08:53   X-ray Femur Left Ap And Lateral  02/08/2014   CLINICAL DATA:  Left hip fracture, ORIF.  EXAM: DG C-ARM 1-60 MIN - NRPT [facility]; LEFT FEMUR - 2 VIEW  COMPARISON:  None.  FINDINGS: Intraoperative fluoroscopy is utilized for surgical control purposes. Fluoroscopy time is reported at 1.5 min.  Spot fluoroscopic images of the left hip demonstrate internal fixation of a fracture of the left femoral neck using intra medullary rod with distal locking screw in proximal compression bolt. Near-anatomic alignment is demonstrated.  IMPRESSION: Intraoperative fluoroscopy obtained, demonstrating internal fixation of left femoral neck fracture.   Electronically Signed   By: Burman Nieves M.D.   On: 02/08/2014 00:02   Dg C-arm 1-60 Min-no Report  02/08/2014   CLINICAL DATA:  Left hip fracture, ORIF.  EXAM: DG C-ARM 1-60 MIN - NRPT [facility]; LEFT FEMUR - 2 VIEW  COMPARISON:  None.  FINDINGS: Intraoperative fluoroscopy is utilized for surgical control purposes. Fluoroscopy time is reported at 1.5 min.  Spot fluoroscopic images of the left hip demonstrate internal fixation of a fracture of the left femoral neck using intra medullary rod with distal locking screw in proximal compression bolt. Near-anatomic alignment is demonstrated.  IMPRESSION: Intraoperative fluoroscopy obtained, demonstrating internal fixation of left femoral neck fracture.   Electronically Signed   By: Burman Nieves  M.D.   On: 02/08/2014 00:02    Microbiology: Recent Results (from the past 240 hour(s))  SURGICAL PCR SCREEN     Status: None   Collection Time    02/07/14  2:32 PM      Result Value Ref Range Status   MRSA, PCR NEGATIVE  NEGATIVE Final   Staphylococcus aureus NEGATIVE  NEGATIVE Final   Comment:            The Xpert SA Assay (FDA     approved for NASAL specimens     in patients  over 69 years of age),     is one component of     a comprehensive surveillance     program.  Test performance has     been validated by [facility] for patients greater     than or equal to 69 year old.     It is not intended     to diagnose infection nor to     guide or monitor treatment.     Labs: Basic Metabolic Panel:  Recent Labs Lab 02/07/14 1107 02/08/14 0144 02/09/14 0428  NA 139 140 141  K 4.3 3.9 4.3  CL 103 102 102  CO2 27 28 28   GLUCOSE 109* 112* 126*  BUN 10 10 7   CREATININE 0.32* 0.39* 0.37*  CALCIUM 8.8 8.2* 8.7   Liver Function Tests:  Recent Labs Lab 02/07/14 1107  AST 34  ALT 31  ALKPHOS 72  BILITOT 0.5  PROT 7.1  ALBUMIN 3.3*   No results found for this basename: LIPASE, AMYLASE,  in the last 168 hours No results found for this basename: AMMONIA,  in the last 168 hours CBC:  Recent Labs Lab 02/07/14 0935 02/08/14 0144 02/09/14 0428  WBC 9.9 9.4 7.4  NEUTROABS 7.6  --   --   HGB 13.2 11.2* 10.8*  HCT 40.3 33.3* 33.6*  MCV 88.8 90.2 89.6  PLT 309 210 195   Cardiac Enzymes: No results found for this basename: CKTOTAL, CKMB, CKMBINDEX, TROPONINI,  in the last 168 hours BNP: BNP (last 3 results)  Recent Labs  06/07/13 0447  PROBNP 126.8*   CBG: No results found for this basename: GLUCAP,  in the last 168 hours     Signed:  Edsel PetrinMIKHAIL, Shalissa Easterwood  [facility] 02/09/2014, 11:29 AM

## 2014-02-09 NOTE — Progress Notes (Signed)
Physical Therapy Treatment Patient Details Name: Rodena MedinSao Cerro MRN: [id number] DOB: 02/14/1946 Today's Date: 02/09/2014    History of Present Illness Pt was admitted for L IM Nail for femur fx.  He is NWB and has a h/o ALS    PT Comments    Patient's family member present for instruction in transfers and mobility. Interpreter phone utilzed for communication.  Follow Up Recommendations  Home health PT;Supervision/Assistance - 24 hour     Equipment Recommendations  Hospital bed (sliding board)    Recommendations for Other Services       Precautions / Restrictions Precautions Precautions: Fall Restrictions LLE Weight Bearing: Touchdown weight bearing    Mobility  Bed Mobility               General bed mobility comments: MD upgraded to TDWB  Transfers Overall transfer level: Needs assistance Equipment used: None (sliding board) Transfers: Lateral/Scoot Transfers           General transfer comment: Pt's caregiver present for  sliding board transfers , requiring 2 person max assist.-instruction provided  via interpreter phone. [facility] and [facility].  Ambulation/Gait                 Stairs            Wheelchair Mobility    Modified Rankin (Stroke Patients Only)       Balance Overall balance assessment: History of Falls Sitting-balance support: Bilateral upper extremity supported;Feet supported Sitting balance-Leahy Scale: Poor Sitting balance - Comments: min guard to mod A; pt relies heavily on arms for support, will list to r when not using UE's for support                            Cognition                            Exercises      General Comments        Pertinent Vitals/Pain Pain Assessment: Faces Faces Pain Scale: Hurts whole lot Pain Location: L hip/thigh Pain Intervention(s): Patient requesting pain meds-RN notified;Ice applied;Repositioned;Monitored during session    Home Living                       Prior Function            PT Goals (current goals can now be found in the care plan section) Progress towards PT goals: Progressing toward goals    Frequency  Min 6X/week    PT Plan      Co-evaluation PT/OT/SLP Co-Evaluation/Treatment: Yes Reason for Co-Treatment: Complexity of the patient's impairments (multi-system involvement);For patient/therapist safety PT goals addressed during session: Mobility/safety with mobility OT goals addressed during session: ADL's and self-care     End of Session Equipment Utilized During Treatment: Gait belt Activity Tolerance: Patient limited by pain;Patient limited by fatigue Patient left: in bed;with call bell/phone within reach;with family/visitor present     Time: 9147-82951513-1605 PT Time Calculation (min): 52 min  Charges:  $Therapeutic Activity: 8-22 mins $Self Care/Home Management: 8-22                    G Codes:      Rada HayHill, Valton Schwartz Elizabeth 02/09/2014, 4:18 PM

## 2014-02-10 DIAGNOSIS — S72002D Fracture of unspecified part of neck of left femur, subsequent encounter for closed fracture with routine healing: Secondary | ICD-10-CM

## 2014-02-10 LAB — CBC
HCT: 29.8 % — ABNORMAL LOW (ref 39.0–52.0)
Hemoglobin: 9.6 g/dL — ABNORMAL LOW (ref 13.0–17.0)
MCH: 29 pg (ref 26.0–34.0)
MCHC: 32.2 g/dL (ref 30.0–36.0)
MCV: 90 fL (ref 78.0–100.0)
Platelets: 176 10*3/uL (ref 150–400)
RBC: 3.31 MIL/uL — ABNORMAL LOW (ref 4.22–5.81)
RDW: 13.7 % (ref 11.5–15.5)
WBC: 6.3 10*3/uL (ref 4.0–10.5)

## 2014-02-10 NOTE — Progress Notes (Signed)
Pt alert and oriented. Patient BP rechecked at 90/50. MD notified, no recommendations or new orders were given.

## 2014-02-10 NOTE — Progress Notes (Signed)
Pt received discharge instructions, concerns were addressed. Pt A&O. Pt discharged to home with transportation service. No other significant changes noted. 

## 2014-02-10 NOTE — Progress Notes (Signed)
Pt received discharge instructions, concerns were addressed. Pt A&O. Pt discharged to home with transportation service. No other significant changes noted.

## 2014-02-10 NOTE — Progress Notes (Signed)
Please see full discharge summary dictated on 02/09/2014. Patient's discharge was held due to obtaining a hospital bed for home use. This is being set up. Patient will be discharged home. No changes.  Patient is stable for discharge.   Time spent: 10 minutes  Sourish Allender D.O. [facility] Pager [phone]  If 7PM-7AM, please contact night-coverage [email or web address] Password [email or web address] 02/10/2014, 10:14 AM

## 2014-02-11 ENCOUNTER — Telehealth: Payer: Self-pay | Admitting: *Deleted

## 2014-02-11 DIAGNOSIS — S72012D Unspecified intracapsular fracture of left femur, subsequent encounter for closed fracture with routine healing: Secondary | ICD-10-CM | POA: Diagnosis not present

## 2014-02-11 DIAGNOSIS — I10 Essential (primary) hypertension: Secondary | ICD-10-CM | POA: Diagnosis not present

## 2014-02-11 DIAGNOSIS — G8929 Other chronic pain: Secondary | ICD-10-CM | POA: Diagnosis not present

## 2014-02-11 DIAGNOSIS — W19XXXD Unspecified fall, subsequent encounter: Secondary | ICD-10-CM | POA: Diagnosis not present

## 2014-02-11 DIAGNOSIS — G1221 Amyotrophic lateral sclerosis: Secondary | ICD-10-CM | POA: Diagnosis not present

## 2014-02-11 DIAGNOSIS — S72042A Displaced fracture of base of neck of left femur, initial encounter for closed fracture: Secondary | ICD-10-CM | POA: Diagnosis not present

## 2014-02-11 DIAGNOSIS — R1310 Dysphagia, unspecified: Secondary | ICD-10-CM | POA: Diagnosis not present

## 2014-02-11 NOTE — Telephone Encounter (Signed)
Transition Care Management Follow-up Telephone Call D/C 02/09/14   How have you been since you were released from the hospital? Called pt due to language bearing he wanted me to talk with the PT [facility]. She stated that he can not walk at this time & family doesn't have transportation. [facility] received order from Dr. Luiz Raymond for OT & PT. Thomas MessierKathy is going to contact Dr. Luiz Raymond to see if they can get a SW order. Once he is able to get transportation family will call to make appt with Dr. Jonny RuizJohn.../lmb    Do you understand why you were in the hospital?  Do you understand the discharge instrcutions? Items Reviewed:  Medications reviewed:   Allergies reviewed:   Dietary changes reviewed:   Referrals reviewed:    Functional Questionnaire:   Activities of Daily Living (ADLs):   PT states he are independent in the following: ambulation, bathing and hygiene and dressing States they require assistance with the following: ambulation, bathing and hygiene and dressing   Any transportation issues/concerns?: YES   Any patient concerns? N/A   Confirmed importance and date/time of follow-up visits scheduled: N/A   Confirmed with patient if condition begins to worsen call PCP or go to the ER.  Patient was given the Call-a-Nurse line [phone]:  N/A

## 2014-02-24 DIAGNOSIS — S72142D Displaced intertrochanteric fracture of left femur, subsequent encounter for closed fracture with routine healing: Secondary | ICD-10-CM | POA: Diagnosis not present

## 2014-03-24 DIAGNOSIS — Z9889 Other specified postprocedural states: Secondary | ICD-10-CM | POA: Diagnosis not present

## 2014-04-04 ENCOUNTER — Telehealth: Payer: Self-pay | Admitting: Internal Medicine

## 2014-04-04 NOTE — Telephone Encounter (Signed)
[facility], sent to Dr. Jonny RuizJohn. 04/04/14/ss.

## 2014-04-12 DIAGNOSIS — R1310 Dysphagia, unspecified: Secondary | ICD-10-CM | POA: Diagnosis not present

## 2014-04-12 DIAGNOSIS — S72042A Displaced fracture of base of neck of left femur, initial encounter for closed fracture: Secondary | ICD-10-CM | POA: Diagnosis not present

## 2014-04-12 DIAGNOSIS — R2689 Other abnormalities of gait and mobility: Secondary | ICD-10-CM | POA: Diagnosis not present

## 2014-04-12 DIAGNOSIS — I10 Essential (primary) hypertension: Secondary | ICD-10-CM | POA: Diagnosis not present

## 2014-04-12 DIAGNOSIS — G1221 Amyotrophic lateral sclerosis: Secondary | ICD-10-CM | POA: Diagnosis not present

## 2014-04-12 DIAGNOSIS — G8929 Other chronic pain: Secondary | ICD-10-CM | POA: Diagnosis not present

## 2014-04-12 DIAGNOSIS — W19XXXD Unspecified fall, subsequent encounter: Secondary | ICD-10-CM | POA: Diagnosis not present

## 2014-04-12 DIAGNOSIS — Z8781 Personal history of (healed) traumatic fracture: Secondary | ICD-10-CM | POA: Diagnosis not present

## 2014-04-12 DIAGNOSIS — Z9889 Other specified postprocedural states: Secondary | ICD-10-CM | POA: Diagnosis not present

## 2014-04-13 DIAGNOSIS — G1221 Amyotrophic lateral sclerosis: Secondary | ICD-10-CM | POA: Diagnosis not present

## 2014-04-13 DIAGNOSIS — G8929 Other chronic pain: Secondary | ICD-10-CM | POA: Diagnosis not present

## 2014-04-13 DIAGNOSIS — R1310 Dysphagia, unspecified: Secondary | ICD-10-CM | POA: Diagnosis not present

## 2014-04-13 DIAGNOSIS — W19XXXD Unspecified fall, subsequent encounter: Secondary | ICD-10-CM | POA: Diagnosis not present

## 2014-04-13 DIAGNOSIS — R2689 Other abnormalities of gait and mobility: Secondary | ICD-10-CM | POA: Diagnosis not present

## 2014-04-13 DIAGNOSIS — I10 Essential (primary) hypertension: Secondary | ICD-10-CM | POA: Diagnosis not present

## 2014-04-18 DIAGNOSIS — R1310 Dysphagia, unspecified: Secondary | ICD-10-CM | POA: Diagnosis not present

## 2014-04-18 DIAGNOSIS — R2689 Other abnormalities of gait and mobility: Secondary | ICD-10-CM | POA: Diagnosis not present

## 2014-04-18 DIAGNOSIS — I10 Essential (primary) hypertension: Secondary | ICD-10-CM | POA: Diagnosis not present

## 2014-04-18 DIAGNOSIS — G8929 Other chronic pain: Secondary | ICD-10-CM | POA: Diagnosis not present

## 2014-04-18 DIAGNOSIS — W19XXXD Unspecified fall, subsequent encounter: Secondary | ICD-10-CM | POA: Diagnosis not present

## 2014-04-18 DIAGNOSIS — G1221 Amyotrophic lateral sclerosis: Secondary | ICD-10-CM | POA: Diagnosis not present

## 2014-04-20 ENCOUNTER — Telehealth: Payer: Self-pay | Admitting: Neurology

## 2014-04-20 DIAGNOSIS — G1221 Amyotrophic lateral sclerosis: Secondary | ICD-10-CM | POA: Diagnosis not present

## 2014-04-20 DIAGNOSIS — G8929 Other chronic pain: Secondary | ICD-10-CM | POA: Diagnosis not present

## 2014-04-20 DIAGNOSIS — R2689 Other abnormalities of gait and mobility: Secondary | ICD-10-CM | POA: Diagnosis not present

## 2014-04-20 DIAGNOSIS — R1310 Dysphagia, unspecified: Secondary | ICD-10-CM | POA: Diagnosis not present

## 2014-04-20 DIAGNOSIS — I10 Essential (primary) hypertension: Secondary | ICD-10-CM | POA: Diagnosis not present

## 2014-04-20 DIAGNOSIS — W19XXXD Unspecified fall, subsequent encounter: Secondary | ICD-10-CM | POA: Diagnosis not present

## 2014-04-20 NOTE — Telephone Encounter (Signed)
Pt canceled his f/u on 04/25/14. Pt did not give reason. He will call later to r/s.

## 2014-04-25 ENCOUNTER — Ambulatory Visit: Payer: Medicare Other | Admitting: Neurology

## 2014-04-26 DIAGNOSIS — M7062 Trochanteric bursitis, left hip: Secondary | ICD-10-CM | POA: Diagnosis not present

## 2014-04-26 DIAGNOSIS — S72142D Displaced intertrochanteric fracture of left femur, subsequent encounter for closed fracture with routine healing: Secondary | ICD-10-CM | POA: Diagnosis not present

## 2014-04-27 DIAGNOSIS — G1221 Amyotrophic lateral sclerosis: Secondary | ICD-10-CM | POA: Diagnosis not present

## 2014-04-27 DIAGNOSIS — I10 Essential (primary) hypertension: Secondary | ICD-10-CM | POA: Diagnosis not present

## 2014-04-27 DIAGNOSIS — W19XXXD Unspecified fall, subsequent encounter: Secondary | ICD-10-CM | POA: Diagnosis not present

## 2014-04-27 DIAGNOSIS — R1310 Dysphagia, unspecified: Secondary | ICD-10-CM | POA: Diagnosis not present

## 2014-04-27 DIAGNOSIS — G8929 Other chronic pain: Secondary | ICD-10-CM | POA: Diagnosis not present

## 2014-04-27 DIAGNOSIS — R2689 Other abnormalities of gait and mobility: Secondary | ICD-10-CM | POA: Diagnosis not present

## 2014-04-29 DIAGNOSIS — G1221 Amyotrophic lateral sclerosis: Secondary | ICD-10-CM | POA: Diagnosis not present

## 2014-04-29 DIAGNOSIS — G8929 Other chronic pain: Secondary | ICD-10-CM | POA: Diagnosis not present

## 2014-04-29 DIAGNOSIS — R2689 Other abnormalities of gait and mobility: Secondary | ICD-10-CM | POA: Diagnosis not present

## 2014-04-29 DIAGNOSIS — I10 Essential (primary) hypertension: Secondary | ICD-10-CM | POA: Diagnosis not present

## 2014-04-29 DIAGNOSIS — W19XXXD Unspecified fall, subsequent encounter: Secondary | ICD-10-CM | POA: Diagnosis not present

## 2014-04-29 DIAGNOSIS — R1310 Dysphagia, unspecified: Secondary | ICD-10-CM | POA: Diagnosis not present

## 2014-05-05 ENCOUNTER — Encounter: Payer: Self-pay | Admitting: Physical Therapy

## 2014-05-05 ENCOUNTER — Ambulatory Visit: Payer: Medicare Other | Attending: Internal Medicine | Admitting: Physical Therapy

## 2014-05-05 ENCOUNTER — Ambulatory Visit: Payer: Medicare Other | Admitting: Physical Therapy

## 2014-05-05 DIAGNOSIS — W19XXXD Unspecified fall, subsequent encounter: Secondary | ICD-10-CM | POA: Diagnosis not present

## 2014-05-05 DIAGNOSIS — I10 Essential (primary) hypertension: Secondary | ICD-10-CM | POA: Diagnosis not present

## 2014-05-05 DIAGNOSIS — R269 Unspecified abnormalities of gait and mobility: Secondary | ICD-10-CM | POA: Insufficient documentation

## 2014-05-05 DIAGNOSIS — G8929 Other chronic pain: Secondary | ICD-10-CM | POA: Diagnosis not present

## 2014-05-05 DIAGNOSIS — R2689 Other abnormalities of gait and mobility: Secondary | ICD-10-CM | POA: Diagnosis not present

## 2014-05-05 DIAGNOSIS — G1221 Amyotrophic lateral sclerosis: Secondary | ICD-10-CM

## 2014-05-05 DIAGNOSIS — R1310 Dysphagia, unspecified: Secondary | ICD-10-CM | POA: Diagnosis not present

## 2014-05-05 NOTE — Therapy (Signed)
[facility] [address] [place], [place], [address] Phone: [phone]   Fax:  [phone]  Patient Details  Name: Thomas Raymond MRN: [id number] Date of Birth: 07/29/1945 Referring Provider:  [name], James W, MD  Encounter Date: 05/05/2014   See Scanned Assessment on this date of service.   Nivedita Mirabella PT, DPT 05/05/2014, 10:29 AM  [facility] [address] [place], [place], [address] Phone: [phone]   Fax:  [phone]

## 2014-05-09 ENCOUNTER — Ambulatory Visit: Payer: Medicare Other | Admitting: Physical Therapy

## 2014-05-17 ENCOUNTER — Ambulatory Visit: Payer: Medicare Other | Admitting: Internal Medicine

## 2014-05-18 DIAGNOSIS — W19XXXD Unspecified fall, subsequent encounter: Secondary | ICD-10-CM | POA: Diagnosis not present

## 2014-05-18 DIAGNOSIS — G1221 Amyotrophic lateral sclerosis: Secondary | ICD-10-CM | POA: Diagnosis not present

## 2014-05-18 DIAGNOSIS — R2689 Other abnormalities of gait and mobility: Secondary | ICD-10-CM | POA: Diagnosis not present

## 2014-05-18 DIAGNOSIS — R1310 Dysphagia, unspecified: Secondary | ICD-10-CM | POA: Diagnosis not present

## 2014-05-18 DIAGNOSIS — G8929 Other chronic pain: Secondary | ICD-10-CM | POA: Diagnosis not present

## 2014-05-18 DIAGNOSIS — I10 Essential (primary) hypertension: Secondary | ICD-10-CM | POA: Diagnosis not present

## 2014-05-19 ENCOUNTER — Ambulatory Visit (INDEPENDENT_AMBULATORY_CARE_PROVIDER_SITE_OTHER): Payer: Medicare Other | Admitting: Internal Medicine

## 2014-05-19 ENCOUNTER — Encounter: Payer: Self-pay | Admitting: Internal Medicine

## 2014-05-19 VITALS — BP 152/70 | HR 57 | Temp 97.6°F | Wt 160.0 lb

## 2014-05-19 DIAGNOSIS — R7302 Impaired glucose tolerance (oral): Secondary | ICD-10-CM | POA: Diagnosis not present

## 2014-05-19 DIAGNOSIS — G1221 Amyotrophic lateral sclerosis: Secondary | ICD-10-CM

## 2014-05-19 DIAGNOSIS — Z23 Encounter for immunization: Secondary | ICD-10-CM

## 2014-05-19 DIAGNOSIS — I10 Essential (primary) hypertension: Secondary | ICD-10-CM | POA: Diagnosis not present

## 2014-05-19 NOTE — Assessment & Plan Note (Signed)
Labile, ok to cont tx as is to avoid too lower BP

## 2014-05-19 NOTE — Assessment & Plan Note (Signed)
/  stable overall by history and exam, recent data reviewed with pt, and pt to continue medical treatment as before,  to f/u any worsening symptoms or concerns Lab Results  Component Value Date   HGBA1C 6.2 10/05/2013

## 2014-05-19 NOTE — Progress Notes (Signed)
Pre visit review using our clinic review tool, if applicable. No additional management support is needed unless otherwise documented below in the visit note. 

## 2014-05-19 NOTE — Patient Instructions (Addendum)
You had the flu shot today  Please continue all other medications as before, and refills have been done if requested.  Please have the pharmacy call with any other refills you may need.  Please keep your appointments with your specialists as you may have planned  Please return in 6 months, or sooner if needed 

## 2014-05-19 NOTE — Progress Notes (Signed)
Subjective:    Patient ID: Thomas Raymond, male    DOB: 02/27/1946, 69 y.o.   MRN: [id number]  HPI  Pt here for power wheelchair assessment  I have reviewed and concur with recent PT evaluation.  Wt Readings from Last 3 Encounters:  05/19/14 160 lb (72.576 kg)  02/07/14 160 lb (72.576 kg)  01/17/14 156 lb (70.761 kg)   Reason for need for power wheelchair in the home: pt needs power wheelchair to move from room to room, o/w unable.  Needs tilt and recline function with the power wheelchair due to inability to pressure relieve to the lower body , and needs the reclined position for ADL care  Wt Readings from Last 3 Encounters:  05/19/14 160 lb (72.576 kg)  02/07/14 160 lb (72.576 kg)  01/17/14 156 lb (70.761 kg)   BP Readings from Last 3 Encounters:  05/19/14 152/70  02/10/14 90/50  01/17/14 160/62   BP labile, sometimes low as above. Due for flu shot  Pt denies polydipsia, polyuria  Past Medical History  Diagnosis Date  . ALLERGIC RHINITIS   . ALS   . HYPERLIPIDEMIA   . HYPERTENSION   . VERTIGO   . LOW BACK PAIN   . Impaired glucose tolerance 02/17/2011   Past Surgical History  Procedure Laterality Date  . Lipoma removal  2003  . Tracheostomy    . Percutaneous gastrostomy tube repalcement      04/1999 and removed 07/1999  . Compression hip screw Left 02/07/2014    Procedure: IM Nail;  Surgeon: Harvie JuniorJohn L Graves, MD;  Location: [facility];  Service: Orthopedics;  Laterality: Left;    reports that he has never smoked. He has never used smokeless tobacco. He reports that he does not drink alcohol or use illicit drugs. family history includes Other in his father and mother; Other (age of onset: 2) in his daughter and son. No Known Allergies Current Outpatient Prescriptions on File Prior to Visit  Medication Sig Dispense Refill  . amLODipine-benazepril (LOTREL) 10-40 MG per capsule Take 1 capsule by mouth daily. 90 capsule 3  . aspirin EC 325 MG tablet Take 1 tablet (325 mg total)  by mouth daily. 30 tablet 0  . atenolol (TENORMIN) 50 MG tablet Take 50 mg by mouth at bedtime.    . cetirizine (ZYRTEC) 10 MG tablet Take 1 tablet (10 mg total) by mouth daily. 90 tablet 3  . HYDROcodone-acetaminophen (NORCO) 5-325 MG per tablet Take 1 tablet by mouth every 6 (six) hours as needed for moderate pain. 40 tablet 0  . rosuvastatin (CRESTOR) 20 MG tablet Take 1 tablet (20 mg total) by mouth daily. 90 tablet 3  . meloxicam (MOBIC) 7.5 MG tablet Take 7.5 mg by mouth daily.    [name]. omeprazole (PRILOSEC) 20 MG capsule Take 20 mg by mouth daily.     No current facility-administered medications on file prior to visit.     Review of Systems  Constitutional: Negative for unusual diaphoresis or other sweats  HENT: Negative for ringing in ear Eyes: Negative for double vision or worsening visual disturbance.  Respiratory: Negative for stridor.   Gastrointestinal: Negative for vomiting or other signifcant bowel change Genitourinary: Negative for hematuria or decreased urine volume.  Musculoskeletal: Negative for other MSK swelling Skin: Negative for color change and worsening wound.  Neurological: Negative for worsening tremors Psychiatric/Behavioral: Negative for decreased concentration or agitation other than above       Objective:   Physical Exam BP 152/70 mmHg  Pulse 57  Temp(Src) 97.6 F (36.4 C) (Oral)  Wt 160 lb (72.576 kg)  SpO2 95% VS noted,  Constitutional: Pt appears well-developed, well-nourished.  HENT: Head: NCAT.  Right Ear: External ear normal.  Left Ear: External ear normal.  Eyes: . Pupils are equal, round, and reactive to light. Conjunctivae and EOM are normal Neck: Normal range of motion. Neck supple.  Cardiovascular: Normal rate and regular rhythm.   Pulmonary/Chest: Effort normal and breath sounds without rales or wheezing.  Abd:  Soft, NT, ND, + BS Neurological: Pt is alert. Not confused , motor o/w as documented per mobility exam/PT Skin: Skin is warm.  No rash Psychiatric: Pt behavior is normal. No agitation.      Assessment & Plan:

## 2014-05-19 NOTE — Assessment & Plan Note (Signed)
Stable, for mobility exam today,  to f/u any worsening symptoms or concerns \

## 2014-05-23 ENCOUNTER — Telehealth: Payer: Self-pay | Admitting: Internal Medicine

## 2014-05-23 NOTE — Telephone Encounter (Signed)
Pt request refill for hydrocodone.  °

## 2014-05-24 DIAGNOSIS — I10 Essential (primary) hypertension: Secondary | ICD-10-CM | POA: Diagnosis not present

## 2014-05-24 DIAGNOSIS — G1221 Amyotrophic lateral sclerosis: Secondary | ICD-10-CM | POA: Diagnosis not present

## 2014-05-24 DIAGNOSIS — R2689 Other abnormalities of gait and mobility: Secondary | ICD-10-CM | POA: Diagnosis not present

## 2014-05-24 DIAGNOSIS — W19XXXD Unspecified fall, subsequent encounter: Secondary | ICD-10-CM | POA: Diagnosis not present

## 2014-05-24 DIAGNOSIS — G8929 Other chronic pain: Secondary | ICD-10-CM | POA: Diagnosis not present

## 2014-05-24 DIAGNOSIS — R1310 Dysphagia, unspecified: Secondary | ICD-10-CM | POA: Diagnosis not present

## 2014-05-24 MED ORDER — HYDROCODONE-ACETAMINOPHEN 5-325 MG PO TABS: 1.0000 | ORAL_TABLET | Freq: Four times a day (QID) | ORAL | Status: DC | PRN

## 2014-05-24 NOTE — Telephone Encounter (Signed)
Done hardcopy to robin  

## 2014-05-25 NOTE — Telephone Encounter (Signed)
Called the patients number and phone not accepting calls at this time.called the daughter left message to call back

## 2014-05-25 NOTE — Telephone Encounter (Signed)
Called the patient phone number not excepting calls

## 2014-05-26 NOTE — Telephone Encounter (Signed)
Left msg on triage requesting refill status on pain med. Called pt back inform him he has to pick rx up at the office. rx ready for pick-up...Raechel Chute/lmb

## 2014-05-30 DIAGNOSIS — R32 Unspecified urinary incontinence: Secondary | ICD-10-CM | POA: Diagnosis not present

## 2014-06-07 DIAGNOSIS — R2689 Other abnormalities of gait and mobility: Secondary | ICD-10-CM | POA: Diagnosis not present

## 2014-06-07 DIAGNOSIS — G8929 Other chronic pain: Secondary | ICD-10-CM | POA: Diagnosis not present

## 2014-06-07 DIAGNOSIS — R1310 Dysphagia, unspecified: Secondary | ICD-10-CM | POA: Diagnosis not present

## 2014-06-07 DIAGNOSIS — I10 Essential (primary) hypertension: Secondary | ICD-10-CM | POA: Diagnosis not present

## 2014-06-07 DIAGNOSIS — G1221 Amyotrophic lateral sclerosis: Secondary | ICD-10-CM | POA: Diagnosis not present

## 2014-06-07 DIAGNOSIS — W19XXXD Unspecified fall, subsequent encounter: Secondary | ICD-10-CM | POA: Diagnosis not present

## 2014-08-02 DIAGNOSIS — R32 Unspecified urinary incontinence: Secondary | ICD-10-CM | POA: Diagnosis not present

## 2014-08-09 ENCOUNTER — Other Ambulatory Visit: Payer: Self-pay | Admitting: Internal Medicine

## 2014-08-24 ENCOUNTER — Telehealth: Payer: Self-pay | Admitting: Internal Medicine

## 2014-08-24 NOTE — Telephone Encounter (Signed)
Patient called just to let you know that he's going to [place] in June for 2[place]3 months and will need his medication sent to a pharmacy there. I told him to just give us a call when the medication needs filled with the pharmacy location and we will send them there.

## 2014-09-26 MED ORDER — ROSUVASTATIN CALCIUM 20 MG PO TABS: 20.0000 mg | ORAL_TABLET | Freq: Every day | ORAL | Status: DC

## 2014-09-26 MED ORDER — ATENOLOL 50 MG PO TABS: 50.0000 mg | ORAL_TABLET | Freq: Every day | ORAL | Status: DC

## 2014-09-26 MED ORDER — CETIRIZINE HCL 10 MG PO TABS: 10.0000 mg | ORAL_TABLET | Freq: Every day | ORAL | Status: DC

## 2014-09-26 MED ORDER — AMLODIPINE BESY-BENAZEPRIL HCL 10-40 MG PO CAPS: 1.0000 | ORAL_CAPSULE | Freq: Every day | ORAL | Status: DC

## 2014-09-26 MED ORDER — OMEPRAZOLE 20 MG PO CPDR: 20.0000 mg | DELAYED_RELEASE_CAPSULE | Freq: Every day | ORAL | Status: DC

## 2014-09-26 NOTE — Telephone Encounter (Signed)
Refills sent to [facility],,,/lmb

## 2014-09-26 NOTE — Telephone Encounter (Signed)
Pt is now in [place] and daughter called and said that he needs 3 month supply called in to pharmacy and they are going to [place] them [place]   Crestor Cetirizine [facility] Atenolol Amlodipine Omeprozole  [facility] [address]   Best number [phone] Daughter

## 2014-10-19 ENCOUNTER — Other Ambulatory Visit: Payer: Self-pay | Admitting: Internal Medicine

## 2014-11-17 ENCOUNTER — Ambulatory Visit: Payer: Medicare Other | Admitting: Internal Medicine

## 2015-02-04 ENCOUNTER — Other Ambulatory Visit: Payer: Self-pay | Admitting: Neurology

## 2015-02-06 ENCOUNTER — Other Ambulatory Visit: Payer: Self-pay | Admitting: *Deleted

## 2015-02-06 MED ORDER — PREGABALIN 75 MG PO CAPS: 75.0000 mg | ORAL_CAPSULE | Freq: Two times a day (BID) | ORAL | Status: DC

## 2015-02-06 NOTE — Telephone Encounter (Signed)
Rx sent 

## 2015-02-14 ENCOUNTER — Ambulatory Visit: Payer: Medicare Other | Admitting: Internal Medicine

## 2015-02-23 ENCOUNTER — Other Ambulatory Visit (INDEPENDENT_AMBULATORY_CARE_PROVIDER_SITE_OTHER): Payer: Medicare Other

## 2015-02-23 ENCOUNTER — Ambulatory Visit (INDEPENDENT_AMBULATORY_CARE_PROVIDER_SITE_OTHER): Payer: Medicare Other | Admitting: Internal Medicine

## 2015-02-23 ENCOUNTER — Encounter: Payer: Self-pay | Admitting: Internal Medicine

## 2015-02-23 VITALS — BP 164/78 | HR 53 | Temp 97.5°F

## 2015-02-23 DIAGNOSIS — R7302 Impaired glucose tolerance (oral): Secondary | ICD-10-CM

## 2015-02-23 DIAGNOSIS — E785 Hyperlipidemia, unspecified: Secondary | ICD-10-CM

## 2015-02-23 DIAGNOSIS — G1221 Amyotrophic lateral sclerosis: Secondary | ICD-10-CM

## 2015-02-23 DIAGNOSIS — N320 Bladder-neck obstruction: Secondary | ICD-10-CM | POA: Diagnosis not present

## 2015-02-23 DIAGNOSIS — I10 Essential (primary) hypertension: Secondary | ICD-10-CM | POA: Diagnosis not present

## 2015-02-23 LAB — BASIC METABOLIC PANEL
BUN: 9 mg/dL (ref 6–23)
CALCIUM: 9.7 mg/dL (ref 8.4–10.5)
CHLORIDE: 104 meq/L (ref 96–112)
CO2: 31 mEq/L (ref 19–32)
CREATININE: 0.46 mg/dL (ref 0.40–1.50)
GFR: 192.19 mL/min (ref >60.00)
Glucose, Bld: 82 mg/dL (ref 70–99)
Potassium: 4.3 mEq/L (ref 3.5–5.1)
Sodium: 142 mEq/L (ref 135–145)

## 2015-02-23 LAB — CBC WITH DIFFERENTIAL/PLATELET
BASOS PCT: 2.7 % (ref 0.0–3.0)
Basophils Absolute: 0.2 10*3/uL — ABNORMAL HIGH (ref 0.0–0.1)
EOS PCT: 3.1 % (ref 0.0–5.0)
Eosinophils Absolute: 0.2 10*3/uL (ref 0.0–0.7)
HEMATOCRIT: 46.5 % (ref 39.0–52.0)
HEMOGLOBIN: 15.2 g/dL (ref 13.0–17.0)
LYMPHS PCT: 27.9 % (ref 12.0–46.0)
Lymphs Abs: 2 10*3/uL (ref 0.7–4.0)
MCHC: 32.8 g/dL (ref 30.0–36.0)
MCV: 86.4 fl (ref 78.0–100.0)
MONOS PCT: 8.3 % (ref 3.0–12.0)
Monocytes Absolute: 0.6 10*3/uL (ref 0.1–1.0)
NEUTROS ABS: 4.3 10*3/uL (ref 1.4–7.7)
Neutrophils Relative %: 58 % (ref 43.0–77.0)
PLATELETS: 235 10*3/uL (ref 150.0–400.0)
RBC: 5.39 Mil/uL (ref 4.22–5.81)
RDW: 14.8 % (ref 11.5–15.5)
WBC: 7.3 10*3/uL (ref 4.0–10.5)

## 2015-02-23 LAB — URINALYSIS, ROUTINE W REFLEX MICROSCOPIC
BILIRUBIN URINE: NEGATIVE
Hgb urine dipstick: NEGATIVE
Ketones, ur: NEGATIVE
Leukocytes, UA: NEGATIVE
NITRITE: NEGATIVE
PH: 7 (ref 5.0–8.0)
RBC / HPF: NONE SEEN (ref 0–?)
SPECIFIC GRAVITY, URINE: 1.01 (ref 1.000–1.030)
Total Protein, Urine: NEGATIVE
Urine Glucose: NEGATIVE
Urobilinogen, UA: 0.2 (ref 0.0–1.0)
WBC UA: NONE SEEN (ref 0–?)

## 2015-02-23 LAB — TSH: TSH: 1.09 u[IU]/mL (ref 0.35–4.50)

## 2015-02-23 LAB — HEPATIC FUNCTION PANEL
ALT: 24 U/L (ref 0–53)
AST: 24 U/L (ref 0–37)
Albumin: 4.3 g/dL (ref 3.5–5.2)
Alkaline Phosphatase: 76 U/L (ref 39–117)
BILIRUBIN DIRECT: 0.1 mg/dL (ref 0.0–0.3)
BILIRUBIN TOTAL: 0.5 mg/dL (ref 0.2–1.2)
Total Protein: 8 g/dL (ref 6.0–8.3)

## 2015-02-23 LAB — LIPID PANEL
CHOL/HDL RATIO: 3
Cholesterol: 143 mg/dL (ref 0–200)
HDL: 51.6 mg/dL (ref >39.00)
LDL CALC: 72 mg/dL (ref 0–99)
NONHDL: 91.65
Triglycerides: 99 mg/dL (ref 0.0–149.0)
VLDL: 19.8 mg/dL (ref 0.0–40.0)

## 2015-02-23 LAB — HEMOGLOBIN A1C: Hgb A1c MFr Bld: 6.2 % (ref 4.6–6.5)

## 2015-02-23 LAB — PSA: PSA: 0.23 ng/mL (ref 0.10–4.00)

## 2015-02-23 MED ORDER — AMLODIPINE BESY-BENAZEPRIL HCL 10-40 MG PO CAPS: 1.0000 | ORAL_CAPSULE | Freq: Every day | ORAL | Status: DC

## 2015-02-23 MED ORDER — CETIRIZINE HCL 10 MG PO TABS: 10.0000 mg | ORAL_TABLET | Freq: Every day | ORAL | Status: DC

## 2015-02-23 MED ORDER — OMEPRAZOLE 20 MG PO CPDR: 20.0000 mg | DELAYED_RELEASE_CAPSULE | Freq: Every day | ORAL | Status: DC

## 2015-02-23 MED ORDER — ATENOLOL 50 MG PO TABS: 50.0000 mg | ORAL_TABLET | Freq: Every day | ORAL | Status: DC

## 2015-02-23 NOTE — Assessment & Plan Note (Signed)
Also for psa as he is due 

## 2015-02-23 NOTE — Assessment & Plan Note (Signed)
stable overall by history and exam, recent data reviewed with pt, and pt to continue medical treatment as before,  to f/u any worsening symptoms or concerns Lab Results  Component Value Date   HGBA1C 6.2 10/05/2013

## 2015-02-23 NOTE — Progress Notes (Signed)
Pre visit review using our clinic review tool, if applicable. No additional management support is needed unless otherwise documented below in the visit note. 

## 2015-02-23 NOTE — Patient Instructions (Addendum)
You had the flu shot today  Please continue all other medications as before, and refills have been done if requested.  Please have the pharmacy call with any other refills you may need.  Please continue your efforts at being more active, low cholesterol diet, and weight control.  You are otherwise up to date with prevention measures today.  Please keep your appointments with your specialists as you may have planned  You will be contacted regarding the referral for: Home Health, and Neurology  Please go to the LAB in the Basement (turn left off the elevator) for the tests to be done today  You will be contacted by phone if any changes need to be made immediately.  Otherwise, you will receive a letter about your results with an explanation, but please check with MyChart first.  Please remember to sign up for MyChart if you have not done so, as this will be important to you in the future with finding out test results, communicating by private email, and scheduling acute appointments online when needed.  Please return in 6 months, or sooner if needed

## 2015-02-23 NOTE — Assessment & Plan Note (Signed)
stable overall by history and exam, recent data reviewed with pt, and pt to continue medical treatment as before,  to f/u any worsening symptoms or concerns BP Readings from Last 3 Encounters:  02/23/15 164/78  05/19/14 152/70  02/10/14 90/50

## 2015-02-23 NOTE — Progress Notes (Signed)
Subjective:    Patient ID: Thomas Raymond, male    DOB: 10/15/1945, 69 y.o.   MRN: [id number]  HPI  Here for yearly f/u;  Overall doing ok;  Pt denies Chest pain, worsening SOB, DOE, wheezing, orthopnea, PND, worsening LE edema, palpitations, dizziness or syncope.  Pt denies neurological change such as new headache, facial or extremity weakness.  Pt denies polydipsia, polyuria, or low sugar symptoms. Pt states overall good compliance with treatment and medications, good tolerability, and has been trying to follow appropriate diet.  Pt denies worsening depressive symptoms, suicidal ideation or panic. No fever, night sweats, wt loss, loss of appetite, or other constitutional symptoms.  Pt states good ability with ADL's, has low fall risk, home safety reviewed and adequate, no other significant changes in hearing or vision  Has been out of BP meds recently, needs re-start Past Medical History  Diagnosis Date  . ALLERGIC RHINITIS   . ALS   . HYPERLIPIDEMIA   . HYPERTENSION   . VERTIGO   . LOW BACK PAIN   . Impaired glucose tolerance 02/17/2011   Past Surgical History  Procedure Laterality Date  . Lipoma removal  2003  . Tracheostomy    . Percutaneous gastrostomy tube repalcement      04/1999 and removed 07/1999  . Compression hip screw Left 02/07/2014    Procedure: IM Nail;  Surgeon: Harvie JuniorJohn L Graves, MD;  Location: [facility];  Service: Orthopedics;  Laterality: Left;    reports that he has never smoked. He has never used smokeless tobacco. He reports that he does not drink alcohol or use illicit drugs. family history includes Other in his father and mother; Other (age of onset: 2) in his daughter and son. No Known Allergies Current Outpatient Prescriptions on File Prior to Visit  Medication Sig Dispense Refill  . aspirin EC 325 MG tablet Take 1 tablet (325 mg total) by mouth daily. 30 tablet 0  . HYDROcodone-acetaminophen (NORCO) 5-325 MG per tablet Take 1 tablet by mouth every 6 (six) hours as needed  for moderate pain. 40 tablet 0  . LYRICA 75 MG capsule TAKE ONE CAPSULE BY MOUTH TWICE A DAY 60 capsule 0  . meloxicam (MOBIC) 7.5 MG tablet Take 7.5 mg by mouth daily.    [name]. omeprazole (PRILOSEC) 20 MG capsule Take 1 capsule (20 mg total) by mouth daily. 90 capsule 2  . pregabalin (LYRICA) 75 MG capsule Take 1 capsule (75 mg total) by mouth 2 (two) times daily. 60 capsule 0  . rosuvastatin (CRESTOR) 20 MG tablet Take 1 tablet (20 mg total) by mouth daily. 90 tablet 2   No current facility-administered medications on file prior to visit.   Review of Systems Constitutional: Negative for increased diaphoresis, other activity, appetite or siginficant weight change other than noted HENT: Negative for worsening hearing loss, ear pain, facial swelling, mouth sores and neck stiffness.   Eyes: Negative for other worsening pain, redness or visual disturbance.  Respiratory: Negative for shortness of breath and wheezing  Cardiovascular: Negative for chest pain and palpitations.  Gastrointestinal: Negative for diarrhea, blood in stool, abdominal distention or other pain Genitourinary: Negative for hematuria, flank pain or change in urine volume.  Musculoskeletal: Negative for myalgias or other joint complaints.  Skin: Negative for color change and wound or drainage.  Neurological: Negative for syncope and numbness. other than noted Hematological: Negative for adenopathy. or other swelling Psychiatric/Behavioral: Negative for hallucinations, SI, self-injury, decreased concentration or other worsening agitation.  Objective:   Physical Exam BP 164/78 mmHg  Pulse 53  Temp(Src) 97.5 F (36.4 C) (Oral)  SpO2 98% VS noted, in wheelchair Constitutional: Pt is oriented to person, place, and time. Appears well-developed and well-nourished, in no significant distress Head: Normocephalic and atraumatic.  Right Ear: External ear normal.  Left Ear: External ear normal.  Nose: Nose normal.    Mouth/Throat: Oropharynx is clear and moist.  Eyes: Conjunctivae and EOM are normal. Pupils are equal, round, and reactive to light.  Neck: Normal range of motion. Neck supple. No JVD present. No tracheal deviation present or significant neck LA or mass Cardiovascular: Normal rate, regular rhythm, normal heart sounds and intact distal pulses.   Pulmonary/Chest: Effort normal and breath sounds without rales or wheezing  Abdominal: Soft. Bowel sounds are normal. NT. No HSM  Musculoskeletal: Normal range of motion. Exhibits no edema.  Lymphadenopathy:  Has no cervical adenopathy.  Neurological: Pt is alert and oriented to person, place, and time. Pt has normal reflexes. No cranial nerve deficit. Motor grossly intact to upper extrem;s Skin: Skin is warm and dry. No rash noted.  Psychiatric:  Has normal mood and affect. Behavior is normal.      Assessment & Plan:

## 2015-02-23 NOTE — Assessment & Plan Note (Signed)
For re-start statin, o/w stable overall by history and exam, recent data reviewed with pt, and pt to continue medical treatment as before,  to f/u any worsening symptoms or concerns Lab Results  Component Value Date   LDLCALC 57 10/05/2013

## 2015-02-23 NOTE — Assessment & Plan Note (Signed)
[facility], and neurology referral

## 2015-03-06 ENCOUNTER — Telehealth: Payer: Self-pay | Admitting: Internal Medicine

## 2015-03-06 NOTE — Telephone Encounter (Signed)
Pt request home health [facility] to be [name] so the nurse can come out to [name] home. Please help, any question please call [place]  Phone # [phone]

## 2015-03-07 NOTE — Telephone Encounter (Signed)
HH referral was placed at last office visit to [facility] per family request. Please clarify with pt/family. Thanks

## 2015-03-08 NOTE — Telephone Encounter (Signed)
Left vm to call back with clarification

## 2015-03-10 ENCOUNTER — Encounter: Payer: Self-pay | Admitting: Internal Medicine

## 2015-03-12 ENCOUNTER — Other Ambulatory Visit: Payer: Self-pay | Admitting: Neurology

## 2015-03-13 NOTE — Telephone Encounter (Signed)
Pt daughter called and was saying that the home health [facility] not [facility]

## 2015-03-13 NOTE — Telephone Encounter (Signed)
Does a new referral need to be put in?

## 2015-03-14 NOTE — Telephone Encounter (Signed)
[facility] faxed to [facility].

## 2015-03-24 ENCOUNTER — Telehealth: Payer: Self-pay | Admitting: Internal Medicine

## 2015-03-24 NOTE — Telephone Encounter (Signed)
Marylene Landngela called from [facility] stated that she is visiting Mr. Mallery and he does not need the nurse aid but he need personal care services from [facility] instead ( they come out and help him with bathing, laundry,house keeping 3 hours---5 days a week. Please get the order to them, Inetta Fermoina from [facility] will be calling us as well about this.   [facility] # [phone]

## 2015-03-24 NOTE — Telephone Encounter (Signed)
Done hardcopy to Dahlia  

## 2015-03-24 NOTE — Telephone Encounter (Signed)
Order placed in hanging basket. Per Marylene LandAngela, [facility] to contact office for orders. I tried to call for the fax number but number provide was incorrect

## 2015-03-29 NOTE — Telephone Encounter (Signed)
Order placed in cabinet for pt/agency pick up

## 2015-04-06 DIAGNOSIS — Z7689 Persons encountering health services in other specified circumstances: Secondary | ICD-10-CM

## 2015-07-19 ENCOUNTER — Inpatient Hospital Stay (HOSPITAL_COMMUNITY)
Admission: EM | Admit: 2015-07-19 | Discharge: 2015-07-21 | DRG: 445 | Disposition: A | Discharge status: Home / Self Care | Payer: Medicare Other | Attending: Surgery | Admitting: Surgery

## 2015-07-19 ENCOUNTER — Inpatient Hospital Stay (HOSPITAL_COMMUNITY): Payer: Medicare Other

## 2015-07-19 ENCOUNTER — Emergency Department (HOSPITAL_COMMUNITY): Payer: Medicare Other

## 2015-07-19 ENCOUNTER — Encounter (HOSPITAL_COMMUNITY): Payer: Self-pay | Admitting: Emergency Medicine

## 2015-07-19 DIAGNOSIS — I10 Essential (primary) hypertension: Secondary | ICD-10-CM | POA: Diagnosis present

## 2015-07-19 DIAGNOSIS — K8000 Calculus of gallbladder with acute cholecystitis without obstruction: Secondary | ICD-10-CM | POA: Diagnosis not present

## 2015-07-19 DIAGNOSIS — Z79899 Other long term (current) drug therapy: Secondary | ICD-10-CM

## 2015-07-19 DIAGNOSIS — Z7982 Long term (current) use of aspirin: Secondary | ICD-10-CM | POA: Diagnosis not present

## 2015-07-19 DIAGNOSIS — R1084 Generalized abdominal pain: Secondary | ICD-10-CM | POA: Diagnosis not present

## 2015-07-19 DIAGNOSIS — K828 Other specified diseases of gallbladder: Secondary | ICD-10-CM | POA: Diagnosis not present

## 2015-07-19 DIAGNOSIS — J309 Allergic rhinitis, unspecified: Secondary | ICD-10-CM | POA: Diagnosis present

## 2015-07-19 DIAGNOSIS — I517 Cardiomegaly: Secondary | ICD-10-CM | POA: Diagnosis present

## 2015-07-19 DIAGNOSIS — K819 Cholecystitis, unspecified: Secondary | ICD-10-CM | POA: Diagnosis present

## 2015-07-19 DIAGNOSIS — Z993 Dependence on wheelchair: Secondary | ICD-10-CM | POA: Diagnosis not present

## 2015-07-19 DIAGNOSIS — Z8673 Personal history of transient ischemic attack (TIA), and cerebral infarction without residual deficits: Secondary | ICD-10-CM

## 2015-07-19 DIAGNOSIS — E785 Hyperlipidemia, unspecified: Secondary | ICD-10-CM | POA: Diagnosis present

## 2015-07-19 DIAGNOSIS — G1221 Amyotrophic lateral sclerosis: Secondary | ICD-10-CM | POA: Diagnosis present

## 2015-07-19 DIAGNOSIS — R109 Unspecified abdominal pain: Secondary | ICD-10-CM

## 2015-07-19 DIAGNOSIS — R1011 Right upper quadrant pain: Secondary | ICD-10-CM | POA: Diagnosis not present

## 2015-07-19 DIAGNOSIS — K810 Acute cholecystitis: Secondary | ICD-10-CM | POA: Diagnosis not present

## 2015-07-19 LAB — COMPREHENSIVE METABOLIC PANEL
ALT: 18 U/L (ref 17–63)
ANION GAP: 8 (ref 5–15)
AST: 22 U/L (ref 15–41)
Albumin: 3.6 g/dL (ref 3.5–5.0)
Alkaline Phosphatase: 69 U/L (ref 38–126)
BUN: 14 mg/dL (ref 6–20)
CHLORIDE: 106 mmol/L (ref 101–111)
CO2: 24 mmol/L (ref 22–32)
Calcium: 8.8 mg/dL — ABNORMAL LOW (ref 8.9–10.3)
Creatinine, Ser: 0.47 mg/dL — ABNORMAL LOW (ref 0.61–1.24)
Glucose, Bld: 113 mg/dL — ABNORMAL HIGH (ref 65–99)
POTASSIUM: 3.8 mmol/L (ref 3.5–5.1)
SODIUM: 138 mmol/L (ref 135–145)
Total Bilirubin: 0.1 mg/dL — ABNORMAL LOW (ref 0.3–1.2)
Total Protein: 7.3 g/dL (ref 6.5–8.1)

## 2015-07-19 LAB — URINALYSIS, ROUTINE W REFLEX MICROSCOPIC
Bilirubin Urine: NEGATIVE
Glucose, UA: NEGATIVE mg/dL
HGB URINE DIPSTICK: NEGATIVE
Ketones, ur: NEGATIVE mg/dL
LEUKOCYTES UA: NEGATIVE
NITRITE: NEGATIVE
PROTEIN: 30 mg/dL — AB
SPECIFIC GRAVITY, URINE: 1.014 (ref 1.005–1.030)
pH: 6.5 (ref 5.0–8.0)

## 2015-07-19 LAB — CBC WITH DIFFERENTIAL/PLATELET
Basophils Absolute: 0 10*3/uL (ref 0.0–0.1)
Basophils Relative: 1 %
EOS ABS: 0.3 10*3/uL (ref 0.0–0.7)
EOS PCT: 3 %
HCT: 40.6 % (ref 39.0–52.0)
Hemoglobin: 13.2 g/dL (ref 13.0–17.0)
LYMPHS ABS: 1.4 10*3/uL (ref 0.7–4.0)
LYMPHS PCT: 18 %
MCH: 28.7 pg (ref 26.0–34.0)
MCHC: 32.5 g/dL (ref 30.0–36.0)
MCV: 88.3 fL (ref 78.0–100.0)
MONO ABS: 0.6 10*3/uL (ref 0.1–1.0)
Monocytes Relative: 7 %
Neutro Abs: 5.8 10*3/uL (ref 1.7–7.7)
Neutrophils Relative %: 71 %
PLATELETS: 230 10*3/uL (ref 150–400)
RBC: 4.6 MIL/uL (ref 4.22–5.81)
RDW: 13.9 % (ref 11.5–15.5)
WBC: 8.1 10*3/uL (ref 4.0–10.5)

## 2015-07-19 LAB — URINE MICROSCOPIC-ADD ON: RBC / HPF: NONE SEEN RBC/hpf (ref 0–5)

## 2015-07-19 LAB — LIPASE, BLOOD: LIPASE: 32 U/L (ref 11–51)

## 2015-07-19 MED ORDER — OXYCODONE-ACETAMINOPHEN 5-325 MG PO TABS: 1.0000 | ORAL_TABLET | ORAL | Status: DC | PRN

## 2015-07-19 MED ORDER — ONDANSETRON 4 MG PO TBDP: 4.0000 mg | ORAL_TABLET | Freq: Four times a day (QID) | ORAL | Status: DC | PRN

## 2015-07-19 MED ORDER — MORPHINE SULFATE (PF) 2 MG/ML IV SOLN: 2.0000 mg | INTRAVENOUS | Status: DC | PRN

## 2015-07-19 MED ORDER — AMLODIPINE BESY-BENAZEPRIL HCL 10-40 MG PO CAPS: 1.0000 | ORAL_CAPSULE | Freq: Every day | ORAL | Status: DC

## 2015-07-19 MED ORDER — BUPIVACAINE-EPINEPHRINE (PF) 0.25% -1:200000 IJ SOLN
INTRAMUSCULAR | Status: AC
  Filled 2015-07-19: qty 30

## 2015-07-19 MED ORDER — ONDANSETRON HCL 4 MG/2ML IJ SOLN: 4.0000 mg | Freq: Four times a day (QID) | INTRAMUSCULAR | Status: DC | PRN

## 2015-07-19 MED ORDER — ACETAMINOPHEN 325 MG PO TABS: 650.0000 mg | ORAL_TABLET | Freq: Four times a day (QID) | ORAL | Status: DC | PRN

## 2015-07-19 MED ORDER — BENAZEPRIL HCL 40 MG PO TABS
40.0000 mg | ORAL_TABLET | Freq: Every day | ORAL | Status: DC
  Administered 2015-07-19 – 2015-07-21 (×3): 40 mg via ORAL
  Filled 2015-07-19 (×3): qty 1

## 2015-07-19 MED ORDER — DEXTROSE 5 % IV SOLN
2.0000 g | Freq: Once | INTRAVENOUS | Status: AC
  Administered 2015-07-19: 2 g via INTRAVENOUS
  Filled 2015-07-19: qty 2

## 2015-07-19 MED ORDER — ACETAMINOPHEN 650 MG RE SUPP: 650.0000 mg | Freq: Four times a day (QID) | RECTAL | Status: DC | PRN

## 2015-07-19 MED ORDER — DEXTROSE 5 % IV SOLN
2.0000 g | INTRAVENOUS | Status: DC
  Administered 2015-07-20 – 2015-07-21 (×2): 2 g via INTRAVENOUS
  Filled 2015-07-19 (×2): qty 2

## 2015-07-19 MED ORDER — IOPAMIDOL (ISOVUE-300) INJECTION 61%
100.0000 mL | Freq: Once | INTRAVENOUS | Status: AC | PRN
  Administered 2015-07-19: 100 mL via INTRAVENOUS

## 2015-07-19 MED ORDER — MORPHINE SULFATE (PF) 4 MG/ML IV SOLN
4.0000 mg | Freq: Once | INTRAVENOUS | Status: AC
  Administered 2015-07-19: 4 mg via INTRAVENOUS
  Filled 2015-07-19: qty 1

## 2015-07-19 MED ORDER — ENOXAPARIN SODIUM 40 MG/0.4ML ~~LOC~~ SOLN
40.0000 mg | SUBCUTANEOUS | Status: DC
  Administered 2015-07-19 – 2015-07-20 (×2): 40 mg via SUBCUTANEOUS
  Filled 2015-07-19 (×3): qty 0.4

## 2015-07-19 MED ORDER — ATENOLOL 50 MG PO TABS
50.0000 mg | ORAL_TABLET | Freq: Every day | ORAL | Status: DC
  Administered 2015-07-19: 50 mg via ORAL
  Filled 2015-07-19 (×3): qty 1

## 2015-07-19 MED ORDER — AMLODIPINE BESYLATE 5 MG PO TABS
5.0000 mg | ORAL_TABLET | Freq: Every day | ORAL | Status: DC
  Administered 2015-07-19 – 2015-07-21 (×3): 5 mg via ORAL
  Filled 2015-07-19 (×3): qty 1

## 2015-07-19 MED ORDER — PANTOPRAZOLE SODIUM 40 MG PO TBEC
40.0000 mg | DELAYED_RELEASE_TABLET | Freq: Every day | ORAL | Status: DC
  Administered 2015-07-20: 40 mg via ORAL
  Filled 2015-07-19 (×2): qty 1

## 2015-07-19 MED ORDER — IOHEXOL 300 MG/ML  SOLN
50.0000 mL | Freq: Once | INTRAMUSCULAR | Status: AC | PRN
  Administered 2015-07-19: 50 mL via ORAL

## 2015-07-19 MED ORDER — ROSUVASTATIN CALCIUM 20 MG PO TABS
20.0000 mg | ORAL_TABLET | Freq: Every day | ORAL | Status: DC
  Administered 2015-07-19 – 2015-07-21 (×3): 20 mg via ORAL
  Filled 2015-07-19 (×3): qty 1

## 2015-07-19 MED ORDER — PREGABALIN 75 MG PO CAPS
75.0000 mg | ORAL_CAPSULE | Freq: Two times a day (BID) | ORAL | Status: DC
  Administered 2015-07-19 – 2015-07-20 (×2): 75 mg via ORAL
  Filled 2015-07-19: qty 1
  Filled 2015-07-19: qty 3
  Filled 2015-07-19 (×2): qty 1

## 2015-07-19 MED ORDER — GI COCKTAIL ~~LOC~~
30.0000 mL | Freq: Once | ORAL | Status: AC
  Administered 2015-07-19: 30 mL via ORAL
  Filled 2015-07-19: qty 30

## 2015-07-19 MED ORDER — ONDANSETRON HCL 4 MG/2ML IJ SOLN
4.0000 mg | Freq: Once | INTRAMUSCULAR | Status: AC
  Administered 2015-07-19: 4 mg via INTRAVENOUS
  Filled 2015-07-19: qty 2

## 2015-07-19 MED ORDER — DEXTROSE-NACL 5-0.9 % IV SOLN
INTRAVENOUS | Status: DC
  Administered 2015-07-19 – 2015-07-20 (×2): via INTRAVENOUS
  Administered 2015-07-20: 75 mL/h via INTRAVENOUS
  Administered 2015-07-21: 08:00:00 via INTRAVENOUS

## 2015-07-19 NOTE — H&P (Signed)
Chief Complaint: abdominal pain HPI: Thomas Raymond is a 70 year old male with a history of ALS, HTN, HLD who is wheelchair bound at baseline presents with epigastric abdominal which started at 2AM.  Previous episode 1 week ago which lasted about 1 hour and resolved.  Denies fever, chills or sweats.  Denies nausea, vomiting, diarrhea.  Denies ill close contacts.  No aggravating or alleviating factors. No modifying factors.  Work up shows, a normal white count, LFTs are normal.  CT of A/P suggests cholecystitis; pericholecystic fluid, distended gallbladder and subtle evidence of gallstones.  We have therefore been asked to evaluate.   Past Medical History  Diagnosis Date  . ALLERGIC RHINITIS   . ALS   . HYPERLIPIDEMIA   . HYPERTENSION   . VERTIGO   . LOW BACK PAIN   . Impaired glucose tolerance 02/17/2011    Past Surgical History  Procedure Laterality Date  . Lipoma removal  2003  . Tracheostomy    . Percutaneous gastrostomy tube repalcement      04/1999 and removed 07/1999  . Compression hip screw Left 02/07/2014    Procedure: IM Nail;  Surgeon: Alta Corning, MD;  Location: [facility];  Service: Orthopedics;  Laterality: Left;    Family History  Problem Relation Age of Onset  . Other Mother     Deceased  . Other Father     Deceased  . Other Son 2    x 2 starvation/illness from [place]  . Other Daughter 2    x 2 starvation/illness from [place]   Social History:  reports that he has never smoked. He has never used smokeless tobacco. He reports that he does not drink alcohol or use illicit drugs.  Allergies: No Known Allergies   (Not in a hospital admission)  Results for orders placed or performed during the hospital encounter of 07/19/15 (from the past 48 hour(s))  Urinalysis, Routine w reflex microscopic (not at [facility])     Status: Abnormal   Collection Time: 07/19/15  8:03 AM  Result Value Ref Range   Color, Urine YELLOW YELLOW   APPearance CLEAR CLEAR   Specific  Gravity, Urine 1.014 1.005 - 1.030   pH 6.5 5.0 - 8.0   Glucose, UA NEGATIVE NEGATIVE mg/dL   Hgb urine dipstick NEGATIVE NEGATIVE   Bilirubin Urine NEGATIVE NEGATIVE   Ketones, ur NEGATIVE NEGATIVE mg/dL   Protein, ur 30 (A) NEGATIVE mg/dL   Nitrite NEGATIVE NEGATIVE   Leukocytes, UA NEGATIVE NEGATIVE  Urine microscopic-add on     Status: Abnormal   Collection Time: 07/19/15  8:03 AM  Result Value Ref Range   Squamous Epithelial / LPF 0-5 (A) NONE SEEN   WBC, UA 0-5 0 - 5 WBC/hpf   RBC / HPF NONE SEEN 0 - 5 RBC/hpf   Bacteria, UA RARE (A) NONE SEEN  CBC with Differential     Status: None   Collection Time: 07/19/15  8:16 AM  Result Value Ref Range   WBC 8.1 4.0 - 10.5 K/uL   RBC 4.60 4.22 - 5.81 MIL/uL   Hemoglobin 13.2 13.0 - 17.0 g/dL   HCT 40.6 39.0 - 52.0 %   MCV 88.3 78.0 - 100.0 fL   MCH 28.7 26.0 - 34.0 pg   MCHC 32.5 30.0 - 36.0 g/dL   RDW 13.9 11.5 - 15.5 %   Platelets 230 150 - 400 K/uL   Neutrophils Relative % 71 %   Neutro Abs 5.8 1.7 -  7.7 K/uL   Lymphocytes Relative 18 %   Lymphs Abs 1.4 0.7 - 4.0 K/uL   Monocytes Relative 7 %   Monocytes Absolute 0.6 0.1 - 1.0 K/uL   Eosinophils Relative 3 %   Eosinophils Absolute 0.3 0.0 - 0.7 K/uL   Basophils Relative 1 %   Basophils Absolute 0.0 0.0 - 0.1 K/uL  Comprehensive metabolic panel     Status: Abnormal   Collection Time: 07/19/15  8:16 AM  Result Value Ref Range   Sodium 138 135 - 145 mmol/L   Potassium 3.8 3.5 - 5.1 mmol/L   Chloride 106 101 - 111 mmol/L   CO2 24 22 - 32 mmol/L   Glucose, Bld 113 (H) 65 - 99 mg/dL   BUN 14 6 - 20 mg/dL   Creatinine, Ser 0.47 (L) 0.61 - 1.24 mg/dL   Calcium 8.8 (L) 8.9 - 10.3 mg/dL   Total Protein 7.3 6.5 - 8.1 g/dL   Albumin 3.6 3.5 - 5.0 g/dL   AST 22 15 - 41 U/L   ALT 18 17 - 63 U/L   Alkaline Phosphatase 69 38 - 126 U/L   Total Bilirubin 0.1 (L) 0.3 - 1.2 mg/dL   GFR calc non Af Amer >60 >60 mL/min   GFR calc Af Amer >60 >60 mL/min    Comment: (NOTE) The eGFR  has been calculated using the CKD EPI equation. This calculation has not been validated in all clinical situations. eGFR's persistently <60 mL/min signify possible Chronic Kidney Disease.    Anion gap 8 5 - 15  Lipase, blood     Status: None   Collection Time: 07/19/15  8:16 AM  Result Value Ref Range   Lipase 32 11 - 51 U/L   Ct Abdomen Pelvis W Contrast  07/19/2015  CLINICAL DATA:  70 year old male with left upper quadrant and epigastric pain since 0200 hours. Initial encounter. EXAM: CT ABDOMEN AND PELVIS WITH CONTRAST TECHNIQUE: Multidetector CT imaging of the abdomen and pelvis was performed using the standard protocol following bolus administration of intravenous contrast. CONTRAST:  131m ISOVUE-300 IOPAMIDOL (ISOVUE-300) INJECTION 61% COMPARISON:  Lumbar radiographs 02/07/2014 and earlier. Lumbar MRI 10/01/2003. FINDINGS: Mild elevation of the right hemidiaphragm with minor lower lobe atelectasis bilaterally. Cardiomegaly. No pericardial or pleural effusion. Degenerative changes in the spine. Postoperative changes to the proximal left femur. No acute osseous abnormality identified. No pelvic free fluid. Unremarkable urinary bladder. Decompressed rectum. Redundant sigmoid colon which is largely decompressed. There is mild gaseous distension of the proximal sigmoid. Negative left colon. Negative transverse colon. Mild interposition of the hepatic flexure between the liver and the dome of the diaphragm. Negative right colon and appendix. Decompressed terminal ileum. Oral contrast has not yet reached the distal most small bowel. No dilated or abnormal small bowel loops. Decompressed stomach and duodenum. No abdominal free air or free fluid. Mild to moderate pericholecystic inflammation, an the gallbladder appears mildly distended. Subtle evidence of gallstones in the lower fundus and neck (coronal image 53). Associated mild hyper enhancement of the liver parenchyma around the gallbladder. Upper  limits of normal intrahepatic and extrahepatic biliary tree. Liver and pancreas otherwise within normal limits. Adrenal glands are within normal limits. The portal venous system is patent. Aortoiliac calcified atherosclerosis noted. Major arterial structures remain patent in the abdomen and pelvis. There is some renal cortical volume loss with multiple small benign renal cysts. Renal contrast enhancement and excretion is symmetric. No lymphadenopathy. IMPRESSION: 1. Strong CT evidence of acute cholecystitis. Recommend  surgery consultation. Right upper quadrant ultrasound would evaluate further if necessary. 2. No other acute findings in the abdomen or pelvis. Mild lung base atelectasis. Imaging findings of potential clinical significance: Calcified aortic atherosclerosis. Electronically Signed   By: Genevie Ann M.D.   On: 07/19/2015 10:29    Review of Systems  Constitutional: Negative for fever, chills, weight loss, malaise/fatigue and diaphoresis.  Eyes: Negative for blurred vision, double vision, photophobia, pain, discharge and redness.  Respiratory: Negative for cough, hemoptysis, sputum production, shortness of breath and wheezing.   Cardiovascular: Negative for chest pain, palpitations, orthopnea, claudication, leg swelling and PND.  Gastrointestinal: Positive for abdominal pain. Negative for heartburn, nausea, vomiting, diarrhea, constipation, blood in stool and melena.  Genitourinary: Negative for dysuria, urgency, frequency and hematuria.  Neurological: Negative for dizziness, tingling, tremors, sensory change, speech change, focal weakness, seizures, loss of consciousness and weakness.    Blood pressure 151/63, pulse 56, temperature 97.9 F (36.6 C), temperature source Oral, resp. rate 16, SpO2 98 %. Physical Exam  Constitutional: He is oriented to person, place, and time. He appears well-nourished. No distress.  HENT:  Head: Normocephalic and atraumatic.  Mouth/Throat: No oropharyngeal  exudate.  Cardiovascular: Normal rate, regular rhythm, normal heart sounds and intact distal pulses.  Exam reveals no gallop and no friction rub.   No murmur heard. Respiratory: Effort normal and breath sounds normal. No respiratory distress. He has no wheezes. He has no rales. He exhibits no tenderness.  GI: Soft. Bowel sounds are normal. He exhibits no mass. There is no rebound and no guarding.  ttp epigastric region   Musculoskeletal: Normal range of motion. He exhibits no edema.  Neurological: He is alert and oriented to person, place, and time.  Skin: Skin is warm and dry. No rash noted. He is not diaphoretic. No erythema. No pallor.  Psychiatric: He has a normal mood and affect. His behavior is normal. Judgment and thought content normal.     Assessment/Plan RUQ abdominal pain-likely acute calculous cholecystitis.  Will confirm with abdominal US.  Admit for IV antibiotics and pain control.  Likely surgery in AM pending [place] results. ID-rocephin ALS-wheelchair bound, able to perform ADLs HTN-home meds HLD-home meds  VTE prophylaxis-SCD/lovenox Dispo-admit to floor   Erby Pian, NP  Pager [phone] 07/19/2015, 11:18 AM

## 2015-07-19 NOTE — ED Notes (Signed)
Pt comes Ed via ems, c/o is generalized left epi gastric pain. No N/V/D noted. Pain started tonight around 2 am, work up from sleep in severe pain. Pt has a hx of gerd.  Pt has HTN, and had a stroke in 2000, and a left femur fracture in 2002.  No known allergies, pt comes from home address listed up to date. Family in room.  Vs on arrival are bp 155/114, pulse 46 regular, rr 16, spo2 100 on room air. Pt is running bradycardia, HTN is noted however.  No recent injuries or falls noted.

## 2015-07-19 NOTE — ED Notes (Signed)
Bed: [id number] Expected date:  Expected time:  Means of arrival:  Comments: Abdominal pain 70 yr old

## 2015-07-19 NOTE — ED Provider Notes (Signed)
CSN: [id number]     Arrival date & time 07/19/15  4098 History   First MD Initiated Contact with Patient 07/19/15 [phone]     Chief Complaint  Patient presents with  . Abdominal Pain     (Consider location/radiation/quality/duration/timing/severity/associated sxs/prior Treatment) HPI Zeke Aker is a 70 y.o. male with history of acid reflux, hypertension, CVA, ALS, presents to emergency department complaining of abdominal pain. Patient states that his pain woke him up around 2 AM this morning. He reports pain in the upper abdomen. Denies any nausea or vomiting. Last bowel movement was yesterday and normal. Denies any blood in his stool or emesis. Nothing is making his pain better or worse. Denies history of similar pain in the past. No urinary symptoms. No fever or chills. He did not take any medications prior to coming in. He has history of percutaneous gastrostomy tube placing in the past, no other abdominal surgeries reported.  Past Medical History  Diagnosis Date  . ALLERGIC RHINITIS   . ALS   . HYPERLIPIDEMIA   . HYPERTENSION   . VERTIGO   . LOW BACK PAIN   . Impaired glucose tolerance 02/17/2011   Past Surgical History  Procedure Laterality Date  . Lipoma removal  2003  . Tracheostomy    . Percutaneous gastrostomy tube repalcement      04/1999 and removed 07/1999  . Compression hip screw Left 02/07/2014    Procedure: IM Nail;  Surgeon: Harvie Junior, MD;  Location: [facility];  Service: Orthopedics;  Laterality: Left;   Family History  Problem Relation Age of Onset  . Other Mother     Deceased  . Other Father     Deceased  . Other Son 2    x 2 starvation/illness from [place]  . Other Daughter 2    x 2 starvation/illness from [place]   Social History  Substance Use Topics  . Smoking status: Never Smoker   . Smokeless tobacco: Never Used  . Alcohol Use: No    Review of Systems  Constitutional: Negative for fever and chills.  Respiratory: Negative for cough,  chest tightness and shortness of breath.   Cardiovascular: Negative for chest pain, palpitations and leg swelling.  Gastrointestinal: Positive for abdominal pain. Negative for nausea, vomiting, diarrhea and abdominal distention.  Genitourinary: Negative for dysuria, urgency, frequency and hematuria.  Musculoskeletal: Negative for myalgias, arthralgias, neck pain and neck stiffness.  Skin: Negative for rash.  Allergic/Immunologic: Negative for immunocompromised state.  Neurological: Negative for dizziness, weakness, light-headedness, numbness and headaches.  All other systems reviewed and are negative.     Allergies  Review of patient's allergies indicates no known allergies.  Home Medications   Prior to Admission medications   Medication Sig Start Date End Date Taking? Authorizing Provider  amLODipine-benazepril (LOTREL) 10-40 MG capsule Take 1 capsule by mouth daily. 02/23/15   Corwin Levins, MD  aspirin EC 325 MG tablet Take 1 tablet (325 mg total) by mouth daily. 02/07/14   Marshia Ly, PA-C  atenolol (TENORMIN) 50 MG tablet Take 1 tablet (50 mg total) by mouth at bedtime. 02/23/15   Corwin Levins, MD  cetirizine (ZYRTEC) 10 MG tablet Take 1 tablet (10 mg total) by mouth daily. 02/23/15   Corwin Levins, MD  HYDROcodone-acetaminophen (NORCO) 5-325 MG per tablet Take 1 tablet by mouth every 6 (six) hours as needed for moderate pain. 05/24/14   Corwin Levins, MD  meloxicam (MOBIC) 7.5 MG tablet Take 7.5 mg  by mouth daily.    Historical Provider, MD  omeprazole (PRILOSEC) 20 MG capsule Take 1 capsule (20 mg total) by mouth daily. 09/26/14   Corwin Levins, MD  pregabalin (LYRICA) 75 MG capsule Take 1 capsule (75 mg total) by mouth 2 (two) times daily. 03/13/15   Donika Concha Se, DO  rosuvastatin (CRESTOR) 20 MG tablet Take 1 tablet (20 mg total) by mouth daily. 09/26/14   Corwin Levins, MD   BP 169/68 mmHg  Temp(Src) 97.9 F (36.6 C) (Oral)  Resp 47  SpO2 99% Physical Exam  Constitutional: He  appears well-developed and well-nourished. No distress.  HENT:  Head: Normocephalic and atraumatic.  Eyes: Conjunctivae are normal.  Neck: Neck supple.  Cardiovascular: Regular rhythm and normal heart sounds.   bradycardic  Pulmonary/Chest: Effort normal. No respiratory distress. He has no wheezes. He has no rales.  Abdominal: Soft. Bowel sounds are normal. He exhibits no distension. There is tenderness. There is no rebound.  LUQ, RUQ tenderness  Musculoskeletal: He exhibits no edema.  Neurological: He is alert.  Skin: Skin is warm and dry.  Nursing note and vitals reviewed.   ED Course  Procedures (including critical care time) Labs Review Labs Reviewed  COMPREHENSIVE METABOLIC PANEL - Abnormal; Notable for the following:    Glucose, Bld 113 (*)    Creatinine, Ser 0.47 (*)    Calcium 8.8 (*)    Total Bilirubin 0.1 (*)    All other components within normal limits  URINALYSIS, ROUTINE W REFLEX MICROSCOPIC (NOT AT [facility]) - Abnormal; Notable for the following:    Protein, ur 30 (*)    All other components within normal limits  URINE MICROSCOPIC-ADD ON - Abnormal; Notable for the following:    Squamous Epithelial / LPF 0-5 (*)    Bacteria, UA RARE (*)    All other components within normal limits  CBC WITH DIFFERENTIAL/PLATELET  LIPASE, BLOOD    Imaging Review Ct Abdomen Pelvis W Contrast  07/19/2015  CLINICAL DATA:  70 year old male with left upper quadrant and epigastric pain since 0200 hours. Initial encounter. EXAM: CT ABDOMEN AND PELVIS WITH CONTRAST TECHNIQUE: Multidetector CT imaging of the abdomen and pelvis was performed using the standard protocol following bolus administration of intravenous contrast. CONTRAST:  ISOVUE-300 IOPAMIDOL (ISOVUE-300) INJECTION 61% COMPARISON:  Lumbar radiographs 02/07/2014 and earlier. Lumbar MRI 10/01/2003. FINDINGS: Mild elevation of the right hemidiaphragm with minor lower lobe atelectasis bilaterally. Cardiomegaly. No pericardial or  pleural effusion. Degenerative changes in the spine. Postoperative changes to the proximal left femur. No acute osseous abnormality identified. No pelvic free fluid. Unremarkable urinary bladder. Decompressed rectum. Redundant sigmoid colon which is largely decompressed. There is mild gaseous distension of the proximal sigmoid. Negative left colon. Negative transverse colon. Mild interposition of the hepatic flexure between the liver and the dome of the diaphragm. Negative right colon and appendix. Decompressed terminal ileum. Oral contrast has not yet reached the distal most small bowel. No dilated or abnormal small bowel loops. Decompressed stomach and duodenum. No abdominal free air or free fluid. Mild to moderate pericholecystic inflammation, an the gallbladder appears mildly distended. Subtle evidence of gallstones in the lower fundus and neck (coronal image 53). Associated mild hyper enhancement of the liver parenchyma around the gallbladder. Upper limits of normal intrahepatic and extrahepatic biliary tree. Liver and pancreas otherwise within normal limits. Adrenal glands are within normal limits. The portal venous system is patent. Aortoiliac calcified atherosclerosis noted. Major arterial structures remain patent in the abdomen  and pelvis. There is some renal cortical volume loss with multiple small benign renal cysts. Renal contrast enhancement and excretion is symmetric. No lymphadenopathy. IMPRESSION: 1. Strong CT evidence of acute cholecystitis. Recommend surgery consultation. Right upper quadrant ultrasound would evaluate further if necessary. 2. No other acute findings in the abdomen or pelvis. Mild lung base atelectasis. Imaging findings of potential clinical significance: Calcified aortic atherosclerosis. Electronically Signed   By: Odessa FlemingH  Hall M.D.   On: 07/19/2015 10:29   [place] Abdomen Limited Ruq  07/19/2015  CLINICAL DATA:  Left upper quadrant and epigastric pain since 2 a.m. this morning. EXAM: US  ABDOMEN LIMITED - RIGHT UPPER QUADRANT COMPARISON:  CT performed earlier today. FINDINGS: Gallbladder: Distended with slight gallbladder wall thickening at 3.3 mm. Negative sonographic Murphy's. Common bile duct: Diameter: Upper limits normal in diameter at 6 mm.  No visible stone Liver: No focal lesion identified. Within normal limits in parenchymal echogenicity. IMPRESSION: Distended gallbladder with slight gallbladder wall thickening. No visible stones or evidence of sonographic Murphy sign. Cannot exclude early acalculous cholecystitis. Electronically Signed   By: Charlett NoseKevin  Dover M.D.   On: 07/19/2015 12:35   I have personally reviewed and evaluated these images and lab results as part of my medical decision-making.   EKG Interpretation None      MDM   Final diagnoses:  Acute calculous cholecystitis  Abdominal pain   Pt with hx of CVA, ALS, htn, presents with epigastric pain. Will get labs and CT abdomen and pelvis further evaluation. Urinalysis ordered as well. We'll try GI cocktail, if no relief will try opiates for pain relief.   CT scan showing possible cholecystitis. I discussed with general surgery, they will come by and see patient. Will hold off on ultrasound for now. Antibiotics ordered. Patient informed of the results. Patient does have normal white blood cell count and normal LFTs and lipase. VS show mildly elevated BP otherwise unremarkable. Pain initially not improved with gi cocktail. Morphine ordered   Filed Vitals:   07/19/15 0911 07/19/15 0930 07/19/15 1300 07/19/15 1437  BP: 151/63 144/92 117/64 125/64  Pulse: 56 105 52 44  Temp:   98.1 F (36.7 C) 98.3 F (36.8 C)  TempSrc:   Oral Oral  Resp: 16 14 15 13   SpO2: 98% 90% 99% 98%     Jaynie Crumbleatyana Kaidin Boehle, PA-C 07/19/15 1543  Gilda Creasehristopher J Pollina, MD 07/20/15 [phone]

## 2015-07-19 NOTE — ED Notes (Signed)
12:15 pt can go to floor,

## 2015-07-19 NOTE — ED Notes (Signed)
Patient transported to CT 

## 2015-07-20 ENCOUNTER — Inpatient Hospital Stay (HOSPITAL_COMMUNITY): Payer: Medicare Other

## 2015-07-20 LAB — COMPREHENSIVE METABOLIC PANEL
ALBUMIN: 3.2 g/dL — AB (ref 3.5–5.0)
ALK PHOS: 127 U/L — AB (ref 38–126)
ALT: 123 U/L — AB (ref 17–63)
AST: 141 U/L — ABNORMAL HIGH (ref 15–41)
Anion gap: 7 (ref 5–15)
BUN: 8 mg/dL (ref 6–20)
CALCIUM: 8.6 mg/dL — AB (ref 8.9–10.3)
CHLORIDE: 110 mmol/L (ref 101–111)
CO2: 27 mmol/L (ref 22–32)
CREATININE: 0.33 mg/dL — AB (ref 0.61–1.24)
GFR calc non Af Amer: >60 mL/min (ref >60)
GLUCOSE: 112 mg/dL — AB (ref 65–99)
Potassium: 4 mmol/L (ref 3.5–5.1)
SODIUM: 144 mmol/L (ref 135–145)
Total Bilirubin: 0.4 mg/dL (ref 0.3–1.2)
Total Protein: 6.3 g/dL — ABNORMAL LOW (ref 6.5–8.1)

## 2015-07-20 LAB — CBC
HCT: 36.1 % — ABNORMAL LOW (ref 39.0–52.0)
HEMOGLOBIN: 11.9 g/dL — AB (ref 13.0–17.0)
MCH: 29.5 pg (ref 26.0–34.0)
MCHC: 33 g/dL (ref 30.0–36.0)
MCV: 89.4 fL (ref 78.0–100.0)
PLATELETS: 216 10*3/uL (ref 150–400)
RBC: 4.04 MIL/uL — AB (ref 4.22–5.81)
RDW: 14 % (ref 11.5–15.5)
WBC: 5.4 10*3/uL (ref 4.0–10.5)

## 2015-07-20 LAB — SURGICAL PCR SCREEN
MRSA, PCR: NEGATIVE
Staphylococcus aureus: NEGATIVE

## 2015-07-20 MED ORDER — TECHNETIUM TC 99M MEBROFENIN IV KIT: 7.0000 | PACK | Freq: Once | INTRAVENOUS | Status: DC | PRN

## 2015-07-20 MED ORDER — MORPHINE SULFATE (PF) 4 MG/ML IV SOLN
INTRAVENOUS | Status: AC
  Administered 2015-07-20: 3 mg via INTRAVENOUS
  Filled 2015-07-20: qty 1

## 2015-07-20 MED ORDER — MORPHINE SULFATE (PF) 4 MG/ML IV SOLN
3.0000 mg | Freq: Once | INTRAVENOUS | Status: AC
  Administered 2015-07-20: 3 mg via INTRAVENOUS

## 2015-07-20 NOTE — Progress Notes (Signed)
Patient ID: Thomas Raymond, male   DOB: [birth year], 70 y.o.   MRN: [id number]     [facility]      [address] [place], [place] [address]    Phone: [phone] FAX: [phone]     Subjective: No pain.  No n/v.  LFTs increased, t bili remains normal. HIDA positive for cholecystitis. [place] without evidence of stones or sludge.   Objective:  Vital signs:  Filed Vitals:   07/19/15 2228 07/20/15 0116 07/20/15 0551 07/20/15 1105  BP: 117/60 120/60 110/64 120/57  Pulse: 41 43 57 43  Temp: 98.6 F (37 C) 98.3 F (36.8 C) 98.6 F (37 C) 98.1 F (36.7 C)  TempSrc: Oral Oral Oral Oral  Resp: _0 Height:      Weight:      SpO2: 94% 93% 99% 97%    Last BM Date: 07/19/15  Intake/Output   Yesterday:  03/29 0701 - 03/30 0700 In: 1260 [P.O.:360; I.V.:900] Out: 800 [Urine:800] This shift:      Physical Exam: General: Pt awake/alert/oriented x4 in and in no acute distress  Abdomen: Soft.  Nondistended.  Non tender.  No evidence of peritonitis.  No incarcerated hernias.   Problem List:   Active Problems:   Acute calculous cholecystitis    Results:   Labs: Results for orders placed or performed during the hospital encounter of 07/19/15 (from the past 48 hour(s))  Urinalysis, Routine w reflex microscopic (not at [facility])     Status: Abnormal   Collection Time: 07/19/15  8:03 AM  Result Value Ref Range   Color, Urine YELLOW YELLOW   APPearance CLEAR CLEAR   Specific Gravity, Urine 1.014 1.005 - 1.030   pH 6.5 5.0 - 8.0   Glucose, UA NEGATIVE NEGATIVE mg/dL   Hgb urine dipstick NEGATIVE NEGATIVE   Bilirubin Urine NEGATIVE NEGATIVE   Ketones, ur NEGATIVE NEGATIVE mg/dL   Protein, ur 30 (A) NEGATIVE mg/dL   Nitrite NEGATIVE NEGATIVE   Leukocytes, UA NEGATIVE NEGATIVE  Urine microscopic-add on     Status: Abnormal   Collection Time: 07/19/15  8:03 AM  Result Value Ref Range   Squamous Epithelial / LPF 0-5 (A) NONE SEEN    WBC, UA 0-5 0 - 5 WBC/hpf   RBC / HPF NONE SEEN 0 - 5 RBC/hpf   Bacteria, UA RARE (A) NONE SEEN  CBC with Differential     Status: None   Collection Time: 07/19/15  8:16 AM  Result Value Ref Range   WBC 8.1 4.0 - 10.5 K/uL   RBC 4.60 4.22 - 5.81 MIL/uL   Hemoglobin 13.2 13.0 - 17.0 g/dL   HCT 40.6 39.0 - 52.0 %   MCV 88.3 78.0 - 100.0 fL   MCH 28.7 26.0 - 34.0 pg   MCHC 32.5 30.0 - 36.0 g/dL   RDW 13.9 11.5 - 15.5 %   Platelets 230 150 - 400 K/uL   Neutrophils Relative % 71 %   Neutro Abs 5.8 1.7 - 7.7 K/uL   Lymphocytes Relative 18 %   Lymphs Abs 1.4 0.7 - 4.0 K/uL   Monocytes Relative 7 %   Monocytes Absolute 0.6 0.1 - 1.0 K/uL   Eosinophils Relative 3 %   Eosinophils Absolute 0.3 0.0 - 0.7 K/uL   Basophils Relative 1 %   Basophils Absolute 0.0 0.0 - 0.1 K/uL  Comprehensive metabolic panel     Status: Abnormal   Collection Time: 07/19/15  8:16 AM  Result Value Ref Range   Sodium 138 135 - 145 mmol/L   Potassium 3.8 3.5 - 5.1 mmol/L   Chloride 106 101 - 111 mmol/L   CO2 24 22 - 32 mmol/L   Glucose, Bld 113 (H) 65 - 99 mg/dL   BUN 14 6 - 20 mg/dL   Creatinine, Ser 0.47 (L) 0.61 - 1.24 mg/dL   Calcium 8.8 (L) 8.9 - 10.3 mg/dL   Total Protein 7.3 6.5 - 8.1 g/dL   Albumin 3.6 3.5 - 5.0 g/dL   AST 22 15 - 41 U/L   ALT 18 17 - 63 U/L   Alkaline Phosphatase 69 38 - 126 U/L   Total Bilirubin 0.1 (L) 0.3 - 1.2 mg/dL   GFR calc non Af Amer >60 >60 mL/min   GFR calc Af Amer >60 >60 mL/min    Comment: (NOTE) The eGFR has been calculated using the CKD EPI equation. This calculation has not been validated in all clinical situations. eGFR's persistently <60 mL/min signify possible Chronic Kidney Disease.    Anion gap 8 5 - 15  Lipase, blood     Status: None   Collection Time: 07/19/15  8:16 AM  Result Value Ref Range   Lipase 32 11 - 51 U/L  Surgical pcr screen     Status: None   Collection Time: 07/20/15  1:15 AM  Result Value Ref Range   MRSA, PCR NEGATIVE NEGATIVE    Staphylococcus aureus NEGATIVE NEGATIVE    Comment:        The Xpert SA Assay (FDA approved for NASAL specimens in patients over 25 years of age), is one component of a comprehensive surveillance program.  Test performance has been validated by [facility] for patients greater than or equal to 52 year old. It is not intended to diagnose infection nor to guide or monitor treatment.   Comprehensive metabolic panel     Status: Abnormal   Collection Time: 07/20/15  4:34 AM  Result Value Ref Range   Sodium 144 135 - 145 mmol/L   Potassium 4.0 3.5 - 5.1 mmol/L   Chloride 110 101 - 111 mmol/L   CO2 27 22 - 32 mmol/L   Glucose, Bld 112 (H) 65 - 99 mg/dL   BUN 8 6 - 20 mg/dL   Creatinine, Ser 0.33 (L) 0.61 - 1.24 mg/dL   Calcium 8.6 (L) 8.9 - 10.3 mg/dL   Total Protein 6.3 (L) 6.5 - 8.1 g/dL   Albumin 3.2 (L) 3.5 - 5.0 g/dL   AST 141 (H) 15 - 41 U/L   ALT 123 (H) 17 - 63 U/L   Alkaline Phosphatase 127 (H) 38 - 126 U/L   Total Bilirubin 0.4 0.3 - 1.2 mg/dL   GFR calc non Af Amer >60 >60 mL/min   GFR calc Af Amer >60 >60 mL/min    Comment: (NOTE) The eGFR has been calculated using the CKD EPI equation. This calculation has not been validated in all clinical situations. eGFR's persistently <60 mL/min signify possible Chronic Kidney Disease.    Anion gap 7 5 - 15  CBC     Status: Abnormal   Collection Time: 07/20/15  4:34 AM  Result Value Ref Range   WBC 5.4 4.0 - 10.5 K/uL   RBC 4.04 (L) 4.22 - 5.81 MIL/uL   Hemoglobin 11.9 (L) 13.0 - 17.0 g/dL   HCT 36.1 (L) 39.0 - 52.0 %   MCV 89.4 78.0 - 100.0 fL  MCH 29.5 26.0 - 34.0 pg   MCHC 33.0 30.0 - 36.0 g/dL   RDW 14.0 11.5 - 15.5 %   Platelets 216 150 - 400 K/uL    Imaging / Studies: Nm Hepatobiliary Liver Func  07/20/2015  CLINICAL DATA:  Abdominal pain, distended gallbladder, acute cholecystitis EXAM: NUCLEAR MEDICINE HEPATOBILIARY IMAGING TECHNIQUE: Sequential images of the abdomen were obtained out to 60 minutes following  intravenous administration of radiopharmaceutical. RADIOPHARMACEUTICALS:  7.0 mCi Tc-36m Choletec IV COMPARISON:  Right upper quadrant sign dated 07/19/2015. CT abdomen pelvis dated 07/19/2015. FINDINGS: Normal hepatic excretion. Small bowel is visualized at 15 minutes. Gallbladder was not visualized at 60 minutes. 2.9 mg morphine IV was administered. Gallbladder was not visualized following an additional 30 minutes after morphine administration. IMPRESSION: Lack of visualization of the gallbladder 30 minutes following administration of morphine, suggesting cystic duct obstruction. This appearance is compatible with cholecystitis. Electronically Signed   By: SJulian HyM.D.   On: 07/20/2015 11:26   Ct Abdomen Pelvis W Contrast  07/19/2015  CLINICAL DATA:  70year old male with left upper quadrant and epigastric pain since 0200 hours. Initial encounter. EXAM: CT ABDOMEN AND PELVIS WITH CONTRAST TECHNIQUE: Multidetector CT imaging of the abdomen and pelvis was performed using the standard protocol following bolus administration of intravenous contrast. CONTRAST:  1081mISOVUE-300 IOPAMIDOL (ISOVUE-300) INJECTION 61% COMPARISON:  Lumbar radiographs 02/07/2014 and earlier. Lumbar MRI 10/01/2003. FINDINGS: Mild elevation of the right hemidiaphragm with minor lower lobe atelectasis bilaterally. Cardiomegaly. No pericardial or pleural effusion. Degenerative changes in the spine. Postoperative changes to the proximal left femur. No acute osseous abnormality identified. No pelvic free fluid. Unremarkable urinary bladder. Decompressed rectum. Redundant sigmoid colon which is largely decompressed. There is mild gaseous distension of the proximal sigmoid. Negative left colon. Negative transverse colon. Mild interposition of the hepatic flexure between the liver and the dome of the diaphragm. Negative right colon and appendix. Decompressed terminal ileum. Oral contrast has not yet reached the distal most small bowel.  No dilated or abnormal small bowel loops. Decompressed stomach and duodenum. No abdominal free air or free fluid. Mild to moderate pericholecystic inflammation, an the gallbladder appears mildly distended. Subtle evidence of gallstones in the lower fundus and neck (coronal image 53). Associated mild hyper enhancement of the liver parenchyma around the gallbladder. Upper limits of normal intrahepatic and extrahepatic biliary tree. Liver and pancreas otherwise within normal limits. Adrenal glands are within normal limits. The portal venous system is patent. Aortoiliac calcified atherosclerosis noted. Major arterial structures remain patent in the abdomen and pelvis. There is some renal cortical volume loss with multiple small benign renal cysts. Renal contrast enhancement and excretion is symmetric. No lymphadenopathy. IMPRESSION: 1. Strong CT evidence of acute cholecystitis. Recommend surgery consultation. Right upper quadrant ultrasound would evaluate further if necessary. 2. No other acute findings in the abdomen or pelvis. Mild lung base atelectasis. Imaging findings of potential clinical significance: Calcified aortic atherosclerosis. Electronically Signed   By: H Genevie Ann.D.   On: 07/19/2015 10:29   UsKoreabdomen Limited Ruq  07/19/2015  CLINICAL DATA:  Left upper quadrant and epigastric pain since 2 a.m. this morning. EXAM: US[place]BDOMEN LIMITED - RIGHT UPPER QUADRANT COMPARISON:  CT performed earlier today. FINDINGS: Gallbladder: Distended with slight gallbladder wall thickening at 3.3 mm. Negative sonographic Murphy's. Common bile duct: Diameter: Upper limits normal in diameter at 6 mm.  No visible stone Liver: No focal lesion identified. Within normal limits in parenchymal echogenicity. IMPRESSION: Distended gallbladder with  slight gallbladder wall thickening. No visible stones or evidence of sonographic Murphy sign. Cannot exclude early acalculous cholecystitis. Electronically Signed   By: Rolm Baptise M.D.    On: 07/19/2015 12:35    Medications / Allergies:  Scheduled Meds: . amLODipine  5 mg Oral Daily   And  . benazepril  40 mg Oral Daily  . atenolol  50 mg Oral QHS  . cefTRIAXone (ROCEPHIN)  IV  2 g Intravenous Q24H  . enoxaparin (LOVENOX) injection  40 mg Subcutaneous Q24H  . pantoprazole  40 mg Oral Daily  . pregabalin  75 mg Oral BID  . rosuvastatin  20 mg Oral Daily   Continuous Infusions: . dextrose 5 % and 0.9% NaCl 75 mL/hr at 07/20/15 0144   PRN Meds:.acetaminophen **OR** acetaminophen, morphine injection, ondansetron **OR** ondansetron (ZOFRAN) IV, oxyCODONE-acetaminophen, technetium TC 17M mebrofenin  Antibiotics: Anti-infectives    Start     Dose/Rate Route Frequency Ordered Stop   07/19/15 1130  cefTRIAXone (ROCEPHIN) 2 g in dextrose 5 % 50 mL IVPB     2 g 100 mL/hr over 30 Minutes Intravenous Every 24 hours 07/19/15 1117     07/19/15 1115  cefTRIAXone (ROCEPHIN) 2 g in dextrose 5 % 50 mL IVPB     2 g 100 mL/hr over 30 Minutes Intravenous  Once 07/19/15 1103 07/19/15 1215        Assessment/Plan Acalculous cholecystitis-clinically improved, watch LFTs.  Given improvement and hx of ALS, we have opted for non operative management.  Continue with antibiotics, start diet, repeat labs in AM ID-rocephin ALS-wheelchair bound, able to perform ADLs HTN-home meds HLD-home meds  VTE prophylaxis-SCD/lovenox Dispo-continue inpatient    Erby Pian, [facility] Pager [phone])   07/20/2015

## 2015-07-21 LAB — CBC
HCT: 37.1 % — ABNORMAL LOW (ref 39.0–52.0)
HEMOGLOBIN: 12 g/dL — AB (ref 13.0–17.0)
MCH: 28.4 pg (ref 26.0–34.0)
MCHC: 32.3 g/dL (ref 30.0–36.0)
MCV: 87.7 fL (ref 78.0–100.0)
PLATELETS: 211 10*3/uL (ref 150–400)
RBC: 4.23 MIL/uL (ref 4.22–5.81)
RDW: 13.8 % (ref 11.5–15.5)
WBC: 5.1 10*3/uL (ref 4.0–10.5)

## 2015-07-21 LAB — COMPREHENSIVE METABOLIC PANEL
ALK PHOS: 116 U/L (ref 38–126)
ALT: 87 U/L — AB (ref 17–63)
AST: 65 U/L — ABNORMAL HIGH (ref 15–41)
Albumin: 3 g/dL — ABNORMAL LOW (ref 3.5–5.0)
Anion gap: 8 (ref 5–15)
BILIRUBIN TOTAL: 0.6 mg/dL (ref 0.3–1.2)
BUN: 6 mg/dL (ref 6–20)
CALCIUM: 8.5 mg/dL — AB (ref 8.9–10.3)
CO2: 26 mmol/L (ref 22–32)
Chloride: 108 mmol/L (ref 101–111)
Glucose, Bld: 128 mg/dL — ABNORMAL HIGH (ref 65–99)
Potassium: 3.8 mmol/L (ref 3.5–5.1)
Sodium: 142 mmol/L (ref 135–145)
TOTAL PROTEIN: 6.4 g/dL — AB (ref 6.5–8.1)

## 2015-07-21 MED ORDER — ACETAMINOPHEN 325 MG PO TABS: 650.0000 mg | ORAL_TABLET | Freq: Four times a day (QID) | ORAL | Status: DC | PRN

## 2015-07-21 MED ORDER — AMOXICILLIN-POT CLAVULANATE 875-125 MG PO TABS: 1.0000 | ORAL_TABLET | Freq: Two times a day (BID) | ORAL | Status: DC

## 2015-07-21 NOTE — Discharge Instructions (Signed)
Low-Fat Diet for Pancreatitis or Gallbladder Conditions °A low-fat diet can be helpful if you have pancreatitis or a gallbladder condition. With these conditions, your pancreas and gallbladder have trouble digesting fats. A healthy eating plan with less fat will help rest your pancreas and gallbladder and reduce your symptoms. °WHAT DO I NEED TO KNOW ABOUT THIS DIET? °· Eat a low-fat diet. °¨ Reduce your fat intake to less than 20-30% of your total daily calories. This is less than 50-60 g of fat per day. °¨ Remember that you need some fat in your diet. Ask your dietician what your daily goal should be. °¨ Choose nonfat and low-fat healthy foods. Look for the words "nonfat," "low fat," or "fat free." °¨ As a guide, look on the label and choose foods with less than 3 g of fat per serving. Eat only one serving. °· Avoid alcohol. °· Do not smoke. If you need help quitting, talk with your health care provider. °· Eat small frequent meals instead of three large heavy meals. °WHAT FOODS CAN I EAT? °Grains °Include healthy grains and starches such as potatoes, wheat bread, fiber-rich cereal, and brown rice. Choose whole grain options whenever possible. In adults, whole grains should account for 45-65% of your daily calories.  °Fruits and Vegetables °Eat plenty of fruits and vegetables. Fresh fruits and vegetables add fiber to your diet. °Meats and Other Protein Sources °Eat lean meat such as chicken and pork. Trim any fat off of meat before cooking it. Eggs, fish, and beans are other sources of protein. In adults, these foods should account for 10-35% of your daily calories. °Dairy °Choose low-fat milk and dairy options. Dairy includes fat and protein, as well as calcium.  °Fats and Oils °Limit high-fat foods such as fried foods, sweets, baked goods, sugary drinks.  °Other °Creamy sauces and condiments, such as mayonnaise, can add extra fat. Think about whether or not you need to use them, or use smaller amounts or low fat  options. °WHAT FOODS ARE NOT RECOMMENDED? °· High fat foods, such as: °¨ Baked goods. °¨ Ice cream. °¨ French toast. °¨ Sweet rolls. °¨ Pizza. °¨ Cheese bread. °¨ Foods covered with batter, butter, creamy sauces, or cheese. °¨ Fried foods. °¨ Sugary drinks and desserts. °· Foods that cause gas or bloating °  °This information is not intended to replace advice given to you by your health care provider. Make sure you discuss any questions you have with your health care provider. °  °Document Released: 04/13/2013 Document Reviewed: 04/13/2013 °Elsevier Interactive Patient Education ©2016 [employer] ° °

## 2015-07-21 NOTE — Progress Notes (Signed)
Discharge instructions discussed with Pt , wife and daughter. Pt declined interpreter . Explained what interpreter line was and how to use --declined . States he understands. States he understands. Able to answer questions about follow up with Dr Oliver BarreJames [name] and has appointment . Pt  and daughter able to answer questions about diet restrictions.

## 2015-07-21 NOTE — Discharge Summary (Signed)
Physician Discharge Summary  Thomas Raymond ZOX:[id number] DOB: [birth year] DOA: 07/19/2015  PCP: Thomas Barre, MD  Consultation: none  Admit date: 07/19/2015 Discharge date: 07/21/2015  Recommendations for Outpatient Follow-up:   Follow-up Information    Follow up with Thomas Barre, MD.   Specialties:  Internal Medicine, Radiology   Contact information:   [address] [facility] [place] [address] [phone]       Follow up with Thomas Merino, MD.   Specialty:  General Surgery   Why:  As needed   Contact information:   [address] [place] [address] [phone]      Discharge Diagnoses:  1. Acalculous cholecystitis  2. ALS 3. HTN 4. HLD   Surgical Procedure: none  Discharge Condition: stable Disposition: home  Diet recommendation: low fat  Filed Weights   07/19/15 2100  Weight: 72.576 kg (160 lb)     HPI: Thomas Raymond is a 70 year old male with a history of ALS, HTN, HLD who is wheelchair bound at baseline presents with epigastric abdominal which started at 2AM. Previous episode 1 week ago which lasted about 1 hour and resolved. Denies fever, chills or sweats. Denies nausea, vomiting, diarrhea. Denies ill close contacts. No aggravating or alleviating factors. No modifying factors. Work up shows, a normal white count, LFTs are normal. CT of A/P suggests cholecystitis; pericholecystic fluid, distended gallbladder and subtle evidence of gallstones. We have therefore been asked to evaluate.   Hospital Course:  The patient was admitted and had a abdominal US which did not show any stones.  HIDA scan revealed acalculous cholecystitis.  He was treated with rocephin.  Given lack of stones and ALS, we opted for non operative management.  He improved quickly, LFTs trended down.  Diet was advanced.  On HD#3 he was felt stable for discharge home with 1 week of Augmentin and a low fat diet.  He may follow up with his PCP.  Warning signs that warrant immediate  attention discussed.  He knows to call with questions or concerns.   Physical Exam: General: Pt awake/alert/oriented x4 in and in no acute distress  Abdomen: Soft. Nondistended. Non tender. No evidence of peritonitis. No incarcerated hernias.   Discharge Instructions     Medication List    STOP taking these medications        HYDROcodone-acetaminophen 5-325 MG tablet  Commonly known as:  NORCO      TAKE these medications        acetaminophen 325 MG tablet  Commonly known as:  TYLENOL  Take 2 tablets (650 mg total) by mouth every 6 (six) hours as needed for mild pain (or temp > 100).     amLODipine-benazepril 10-40 MG capsule  Commonly known as:  LOTREL  Take 1 capsule by mouth daily.     amoxicillin-clavulanate 875-125 MG tablet  Commonly known as:  AUGMENTIN  Take 1 tablet by mouth 2 (two) times daily.     atenolol 50 MG tablet  Commonly known as:  TENORMIN  Take 1 tablet (50 mg total) by mouth at bedtime.     cetirizine 10 MG tablet  Commonly known as:  ZYRTEC  Take 1 tablet (10 mg total) by mouth daily.     omeprazole 20 MG capsule  Commonly known as:  PRILOSEC  Take 1 capsule (20 mg total) by mouth daily.     pregabalin 75 MG capsule  Commonly known as:  LYRICA  Take 1 capsule (75 mg  total) by mouth 2 (two) times daily.     rosuvastatin 20 MG tablet  Commonly known as:  CRESTOR  Take 1 tablet (20 mg total) by mouth daily.           Follow-up Information    Follow up with Thomas BarreJames [name], MD.   Specialties:  Internal Medicine, Radiology   Contact information:   [address] [facility] [place] [address] [phone]       Follow up with Thomas MerinoMARTIN,Thomas B, MD.   Specialty:  General Surgery   Why:  As needed   Contact information:   [address] [place] [address] [phone]        The results of significant diagnostics from this hospitalization (including imaging, microbiology, ancillary and laboratory) are listed below  for reference.    Significant Diagnostic Studies: Nm Hepatobiliary Liver Func  07/20/2015  CLINICAL DATA:  Abdominal pain, distended gallbladder, acute cholecystitis EXAM: NUCLEAR MEDICINE HEPATOBILIARY IMAGING TECHNIQUE: Sequential images of the abdomen were obtained out to 60 minutes following intravenous administration of radiopharmaceutical. RADIOPHARMACEUTICALS:  7.0 mCi [id number]  Choletec IV COMPARISON:  Right upper quadrant sign dated 07/19/2015. CT abdomen pelvis dated 07/19/2015. FINDINGS: Normal hepatic excretion. Small bowel is visualized at 15 minutes. Gallbladder was not visualized at 60 minutes. 2.9 mg morphine IV was administered. Gallbladder was not visualized following an additional 30 minutes after morphine administration. IMPRESSION: Lack of visualization of the gallbladder 30 minutes following administration of morphine, suggesting cystic duct obstruction. This appearance is compatible with cholecystitis. Electronically Signed   By: Charline BillsSriyesh  Krishnan M.D.   On: 07/20/2015 11:26   Ct Abdomen Pelvis W Contrast  07/19/2015  CLINICAL DATA:  70 year old male with left upper quadrant and epigastric pain since 0200 hours. Initial encounter. EXAM: CT ABDOMEN AND PELVIS WITH CONTRAST TECHNIQUE: Multidetector CT imaging of the abdomen and pelvis was performed using the standard protocol following bolus administration of intravenous contrast. CONTRAST:  100mL ISOVUE-300 IOPAMIDOL (ISOVUE-300) INJECTION 61% COMPARISON:  Lumbar radiographs 02/07/2014 and earlier. Lumbar MRI 10/01/2003. FINDINGS: Mild elevation of the right hemidiaphragm with minor lower lobe atelectasis bilaterally. Cardiomegaly. No pericardial or pleural effusion. Degenerative changes in the spine. Postoperative changes to the proximal left femur. No acute osseous abnormality identified. No pelvic free fluid. Unremarkable urinary bladder. Decompressed rectum. Redundant sigmoid colon which is largely decompressed. There is mild gaseous  distension of the proximal sigmoid. Negative left colon. Negative transverse colon. Mild interposition of the hepatic flexure between the liver and the dome of the diaphragm. Negative right colon and appendix. Decompressed terminal ileum. Oral contrast has not yet reached the distal most small bowel. No dilated or abnormal small bowel loops. Decompressed stomach and duodenum. No abdominal free air or free fluid. Mild to moderate pericholecystic inflammation, an the gallbladder appears mildly distended. Subtle evidence of gallstones in the lower fundus and neck (coronal image 53). Associated mild hyper enhancement of the liver parenchyma around the gallbladder. Upper limits of normal intrahepatic and extrahepatic biliary tree. Liver and pancreas otherwise within normal limits. Adrenal glands are within normal limits. The portal venous system is patent. Aortoiliac calcified atherosclerosis noted. Major arterial structures remain patent in the abdomen and pelvis. There is some renal cortical volume loss with multiple small benign renal cysts. Renal contrast enhancement and excretion is symmetric. No lymphadenopathy. IMPRESSION: 1. Strong CT evidence of acute cholecystitis. Recommend surgery consultation. Right upper quadrant ultrasound would evaluate further if necessary. 2. No other acute findings in the abdomen  or pelvis. Mild lung base atelectasis. Imaging findings of potential clinical significance: Calcified aortic atherosclerosis. Electronically Signed   By: Odessa Fleming M.D.   On: 07/19/2015 10:29   US Abdomen Limited Ruq  07/19/2015  CLINICAL DATA:  Left upper quadrant and epigastric pain since 2 a.m. this morning. EXAM: US ABDOMEN LIMITED - RIGHT UPPER QUADRANT COMPARISON:  CT performed earlier today. FINDINGS: Gallbladder: Distended with slight gallbladder wall thickening at 3.3 mm. Negative sonographic Murphy's. Common bile duct: Diameter: Upper limits normal in diameter at 6 mm.  No visible stone Liver: No  focal lesion identified. Within normal limits in parenchymal echogenicity. IMPRESSION: Distended gallbladder with slight gallbladder wall thickening. No visible stones or evidence of sonographic Murphy sign. Cannot exclude early acalculous cholecystitis. Electronically Signed   By: Charlett Nose M.D.   On: 07/19/2015 12:35    Microbiology: Recent Results (from the past 240 hour(s))  Surgical pcr screen     Status: None   Collection Time: 07/20/15  1:15 AM  Result Value Ref Range Status   MRSA, PCR NEGATIVE NEGATIVE Final   Staphylococcus aureus NEGATIVE NEGATIVE Final    Comment:        The Xpert SA Assay (FDA approved for NASAL specimens in patients over 47 years of age), is one component of a comprehensive surveillance program.  Test performance has been validated by [facility] for patients greater than or equal to 18 year old. It is not intended to diagnose infection nor to guide or monitor treatment.      Labs: Basic Metabolic Panel:  Recent Labs Lab 07/19/15 0816 07/20/15 0434 07/21/15 0424  NA 138 144 142  K 3.8 4.0 3.8  CL 106 110 108  CO2 GLUCOSE 113* 112* 128*  BUN CREATININE 0.47* 0.33* <0.30*  CALCIUM 8.8* 8.6* 8.5*   Liver Function Tests:  Recent Labs Lab 07/19/15 0816 07/20/15 0434 07/21/15 0424  AST 22 141* 65*  ALT 18 123* 87*  ALKPHOS 69 127* 116  BILITOT 0.1* 0.4 0.6  PROT 7.3 6.3* 6.4*  ALBUMIN 3.6 3.2* 3.0*    Recent Labs Lab 07/19/15 0816  LIPASE 32   No results for input(s): AMMONIA in the last 168 hours. CBC:  Recent Labs Lab 07/19/15 0816 07/20/15 0434 07/21/15 0424  WBC 8.1 5.4 5.1  NEUTROABS 5.8  --   --   HGB 13.2 11.9* 12.0*  HCT 40.6 36.1* 37.1*  MCV 88.3 89.4 87.7  PLT 230 216 211   Cardiac Enzymes: No results for input(s): CKTOTAL, CKMB, CKMBINDEX, TROPONINI in the last 168 hours. BNP: BNP (last 3 results) No results for input(s): BNP in the last 8760 hours.  ProBNP (last 3 results) No  results for input(s): PROBNP in the last 8760 hours.  CBG: No results for input(s): GLUCAP in the last 168 hours.  Active Problems:   Acalculous cholecystitis   Time coordinating discharge: <30 mins   Signed:  Chryl Holten, ANP-BC

## 2015-07-24 ENCOUNTER — Other Ambulatory Visit: Payer: Self-pay | Admitting: Internal Medicine

## 2015-08-03 ENCOUNTER — Other Ambulatory Visit: Payer: Self-pay | Admitting: Neurology

## 2015-08-03 NOTE — Telephone Encounter (Signed)
Rx refused.  Patient has not been seen since 2015.

## 2015-08-11 ENCOUNTER — Other Ambulatory Visit: Payer: Self-pay | Admitting: Neurology

## 2015-08-11 NOTE — Telephone Encounter (Signed)
Rx refused.  Patient needs an appointment. 

## 2015-08-23 ENCOUNTER — Ambulatory Visit: Payer: Medicare Other | Admitting: Internal Medicine

## 2015-08-24 ENCOUNTER — Ambulatory Visit (INDEPENDENT_AMBULATORY_CARE_PROVIDER_SITE_OTHER): Payer: Medicare Other | Admitting: Internal Medicine

## 2015-08-24 VITALS — BP 146/84 | HR 80 | Temp 97.5°F | Resp 20 | Wt 150.0 lb

## 2015-08-24 DIAGNOSIS — R7302 Impaired glucose tolerance (oral): Secondary | ICD-10-CM

## 2015-08-24 DIAGNOSIS — E785 Hyperlipidemia, unspecified: Secondary | ICD-10-CM

## 2015-08-24 DIAGNOSIS — G1221 Amyotrophic lateral sclerosis: Secondary | ICD-10-CM

## 2015-08-24 DIAGNOSIS — I10 Essential (primary) hypertension: Secondary | ICD-10-CM | POA: Diagnosis not present

## 2015-08-24 MED ORDER — PREGABALIN 75 MG PO CAPS: 75.0000 mg | ORAL_CAPSULE | Freq: Two times a day (BID) | ORAL | Status: DC

## 2015-08-24 NOTE — Progress Notes (Signed)
Pre visit review using our clinic review tool, if applicable. No additional management support is needed unless otherwise documented below in the visit note. 

## 2015-08-24 NOTE — Patient Instructions (Addendum)
Please continue all other medications as before, and refills have been done if requested - the lyrica  Please have the pharmacy call with any other refills you may need.  Please continue your efforts at being more active, low cholesterol diet, and weight control.  You are otherwise up to date with prevention measures today.  Please keep your appointments with your specialists as you may have planned  You will be contacted regarding the referral for: Dr Allena KatzPatel - neurology  Please return in 6 months, or sooner if needed

## 2015-08-24 NOTE — Progress Notes (Signed)
Subjective:    Patient ID: Thomas Raymond, male    DOB: 10/02/1945, 70 y.o.   MRN: [id number]  HPI Here to f/u; overall doing ok,  Pt denies chest pain, increasing sob or doe, wheezing, orthopnea, PND, increased LE swelling, palpitations, dizziness or syncope.  Pt denies new neurological symptoms such as new headache, or facial or extremity weakness or numbness.  Pt denies polydipsia, polyuria, or low sugar episode.   Pt denies new neurological symptoms such as new headache, or facial or extremity weakness or numbness.   Pt states overall good compliance with meds, mostly trying to follow appropriate diet, with wt overall stable,  but little exercise however. Wt Readings from Last 3 Encounters:  08/24/15 150 lb (68.04 kg)  07/19/15 160 lb (72.576 kg)  05/19/14 160 lb (72.576 kg)  Wts are estimates due to pt not able to stand for wt. Past Medical History  Diagnosis Date  . ALLERGIC RHINITIS   . ALS   . HYPERLIPIDEMIA   . HYPERTENSION   . VERTIGO   . LOW BACK PAIN   . Impaired glucose tolerance 02/17/2011   Past Surgical History  Procedure Laterality Date  . Lipoma removal  2003  . Tracheostomy    . Percutaneous gastrostomy tube repalcement      04/1999 and removed 07/1999  . Compression hip screw Left 02/07/2014    Procedure: IM Nail;  Surgeon: Harvie JuniorJohn L Graves, MD;  Location: [facility];  Service: Orthopedics;  Laterality: Left;    reports that he has never smoked. He has never used smokeless tobacco. He reports that he does not drink alcohol or use illicit drugs. family history includes Other in his father and mother; Other (age of onset: 2) in his daughter and son. No Known Allergies Current Outpatient Prescriptions on File Prior to Visit  Medication Sig Dispense Refill  . acetaminophen (TYLENOL) 325 MG tablet Take 2 tablets (650 mg total) by mouth every 6 (six) hours as needed for mild pain (or temp > 100).    [name]. amLODipine-benazepril (LOTREL) 10-40 MG capsule TAKE ONE CAPSULE BY MOUTH EVERY  DAY 90 capsule 2  . atenolol (TENORMIN) 50 MG tablet Take 1 tablet (50 mg total) by mouth at bedtime. 90 tablet 1  . cetirizine (ZYRTEC) 10 MG tablet Take 1 tablet (10 mg total) by mouth daily. 90 tablet 1  . omeprazole (PRILOSEC) 20 MG capsule Take 1 capsule (20 mg total) by mouth daily. 90 capsule 2  . rosuvastatin (CRESTOR) 20 MG tablet Take 1 tablet (20 mg total) by mouth daily. 90 tablet 2   No current facility-administered medications on file prior to visit.    Review of Systems  Constitutional: Negative for unusual diaphoresis or night sweats HENT: Negative for ear swelling or discharge Eyes: Negative for worsening visual haziness  Respiratory: Negative for choking and stridor.   Gastrointestinal: Negative for distension or worsening eructation Genitourinary: Negative for retention or change in urine volume.  Musculoskeletal: Negative for other MSK pain or swelling Skin: Negative for color change and worsening wound Neurological: Negative for tremors and numbness other than noted  Psychiatric/Behavioral: Negative for decreased concentration or agitation other than above       Objective:   Physical Exam BP 146/84 mmHg  Pulse 80  Temp(Src) 97.5 F (36.4 C) (Oral)  Resp 20  Wt 150 lb (68.04 kg)  SpO2 96% VS noted,  Constitutional: Pt appears in no apparent distress HENT: Head: NCAT.  Right Ear: External ear normal.  Left Ear: External ear normal.  Eyes: . Pupils are equal, round, and reactive to light. Conjunctivae and EOM are normal Neck: Normal range of motion. Neck supple.  Cardiovascular: Normal rate and regular rhythm.   Pulmonary/Chest: Effort normal and breath sounds without rales or wheezing.  Neurological: Pt is alert. Not confused  Skin: Skin is warm. No rash, no LE edema Psychiatric: Pt behavior is normal. No agitation.      Assessment & Plan:

## 2015-08-26 NOTE — Assessment & Plan Note (Signed)
stable overall by history and exam, recent data reviewed with pt, and pt to continue medical treatment as before,  to f/u any worsening symptoms or concerns Lab Results  Component Value Date   LDLCALC 72 02/23/2015

## 2015-08-26 NOTE — Assessment & Plan Note (Signed)
stable overall by history and exam, recent data reviewed with pt, and pt to continue medical treatment as before,  to f/u any worsening symptoms or concerns BP Readings from Last 3 Encounters:  08/24/15 146/84  07/21/15 122/68  02/23/15 164/78

## 2015-08-26 NOTE — Assessment & Plan Note (Signed)
stable overall by history and exam, recent data reviewed with pt, and pt to continue medical treatment as before,  to f/u any worsening symptoms or concerns Lab Results  Component Value Date   HGBA1C 6.2 02/23/2015    

## 2015-08-26 NOTE — Assessment & Plan Note (Signed)
stable overall by history and exam, due for neuro f/u , for referral and pt to continue medical treatment as before,  to f/u any worsening symptoms

## 2015-09-21 ENCOUNTER — Encounter: Payer: Self-pay | Admitting: Internal Medicine

## 2015-09-24 ENCOUNTER — Other Ambulatory Visit: Payer: Self-pay | Admitting: Internal Medicine

## 2015-09-30 ENCOUNTER — Encounter (HOSPITAL_COMMUNITY): Payer: Self-pay | Admitting: *Deleted

## 2015-09-30 ENCOUNTER — Emergency Department (HOSPITAL_COMMUNITY): Payer: Medicare Other

## 2015-09-30 ENCOUNTER — Emergency Department (HOSPITAL_COMMUNITY)
Admission: EM | Admit: 2015-09-30 | Discharge: 2015-09-30 | Disposition: A | Discharge status: Home / Self Care | Payer: Medicare Other | Attending: Emergency Medicine | Admitting: Emergency Medicine

## 2015-09-30 DIAGNOSIS — S42211A Unspecified displaced fracture of surgical neck of right humerus, initial encounter for closed fracture: Secondary | ICD-10-CM | POA: Diagnosis not present

## 2015-09-30 DIAGNOSIS — Z79899 Other long term (current) drug therapy: Secondary | ICD-10-CM | POA: Insufficient documentation

## 2015-09-30 DIAGNOSIS — Y9301 Activity, walking, marching and hiking: Secondary | ICD-10-CM | POA: Insufficient documentation

## 2015-09-30 DIAGNOSIS — M79601 Pain in right arm: Secondary | ICD-10-CM | POA: Diagnosis not present

## 2015-09-30 DIAGNOSIS — E785 Hyperlipidemia, unspecified: Secondary | ICD-10-CM | POA: Diagnosis not present

## 2015-09-30 DIAGNOSIS — I10 Essential (primary) hypertension: Secondary | ICD-10-CM | POA: Insufficient documentation

## 2015-09-30 DIAGNOSIS — W010XXA Fall on same level from slipping, tripping and stumbling without subsequent striking against object, initial encounter: Secondary | ICD-10-CM | POA: Diagnosis not present

## 2015-09-30 DIAGNOSIS — Y999 Unspecified external cause status: Secondary | ICD-10-CM | POA: Insufficient documentation

## 2015-09-30 DIAGNOSIS — Y929 Unspecified place or not applicable: Secondary | ICD-10-CM | POA: Insufficient documentation

## 2015-09-30 DIAGNOSIS — T148 Other injury of unspecified body region: Secondary | ICD-10-CM | POA: Diagnosis not present

## 2015-09-30 DIAGNOSIS — M25511 Pain in right shoulder: Secondary | ICD-10-CM | POA: Diagnosis present

## 2015-09-30 MED ORDER — HYDROCODONE-ACETAMINOPHEN 5-325 MG PO TABS: 1.0000 | ORAL_TABLET | ORAL | Status: DC | PRN

## 2015-09-30 MED ORDER — OXYCODONE-ACETAMINOPHEN 5-325 MG PO TABS
1.0000 | ORAL_TABLET | ORAL | Status: DC | PRN
  Administered 2015-09-30: 1 via ORAL
  Filled 2015-09-30: qty 1

## 2015-09-30 MED ORDER — MORPHINE SULFATE (PF) 4 MG/ML IV SOLN
4.0000 mg | Freq: Once | INTRAVENOUS | Status: AC
  Administered 2015-09-30: 4 mg via INTRAVENOUS
  Filled 2015-09-30: qty 1

## 2015-09-30 NOTE — ED Provider Notes (Signed)
CSN: [id number]     Arrival date & time 09/30/15  1121 History   First MD Initiated Contact with Patient 09/30/15 1217     Chief Complaint  Patient presents with  . Fall  . Shoulder Pain    HPI Pt was walking and started to slip.  He has history of ALS and difficulty with ambulation. His wife caught him and tried to help him up.  He hurt his shoulder as she held his arm when he was falling.  He now has pain moving his right shoulder.  NO head injury.  NO chest pain.  No abdominal pain.  No new numbness or weakness. Past Medical History  Diagnosis Date  . ALLERGIC RHINITIS   . ALS   . HYPERLIPIDEMIA   . HYPERTENSION   . VERTIGO   . LOW BACK PAIN   . Impaired glucose tolerance 02/17/2011   Past Surgical History  Procedure Laterality Date  . Lipoma removal  2003  . Tracheostomy    . Percutaneous gastrostomy tube repalcement      04/1999 and removed 07/1999  . Compression hip screw Left 02/07/2014    Procedure: IM Nail;  Surgeon: Harvie Junior, MD;  Location: [facility];  Service: Orthopedics;  Laterality: Left;   Family History  Problem Relation Age of Onset  . Other Mother     Deceased  . Other Father     Deceased  . Other Son 2    x 2 starvation/illness from [place]  . Other Daughter 2    x 2 starvation/illness from refugee [place]   Social History  Substance Use Topics  . Smoking status: Never Smoker   . Smokeless tobacco: Never Used  . Alcohol Use: No    Review of Systems  All other systems reviewed and are negative.     Allergies  Review of patient's allergies indicates no known allergies.  Home Medications   Prior to Admission medications   Medication Sig Start Date End Date Taking? Authorizing Provider  acetaminophen (TYLENOL) 325 MG tablet Take 2 tablets (650 mg total) by mouth every 6 (six) hours as needed for mild pain (or temp > 100). 07/21/15  Yes Emina Riebock, NP  amLODipine-benazepril (LOTREL) 10-40 MG capsule TAKE ONE CAPSULE BY MOUTH EVERY DAY  07/25/15  Yes Corwin Levins, MD  atenolol (TENORMIN) 50 MG tablet Take 1 tablet (50 mg total) by mouth at bedtime. 02/23/15  Yes Corwin Levins, MD  cetirizine (ZYRTEC) 10 MG tablet Take 1 tablet (10 mg total) by mouth daily. 02/23/15  Yes Corwin Levins, MD  omeprazole (PRILOSEC) 20 MG capsule TAKE ONE CAPSULE BY MOUTH EVERY DAY 09/25/15  Yes Corwin Levins, MD  pregabalin (LYRICA) 75 MG capsule Take 1 capsule (75 mg total) by mouth 2 (two) times daily. 08/24/15  Yes Corwin Levins, MD  rosuvastatin (CRESTOR) 20 MG tablet Take 1 tablet (20 mg total) by mouth daily. 09/26/14  Yes Corwin Levins, MD  HYDROcodone-acetaminophen (NORCO/VICODIN) 5-325 MG tablet Take 1-2 tablets by mouth every 4 (four) hours as needed. 09/30/15   Linwood Dibbles, MD   BP 137/84 mmHg  Pulse 49  Temp(Src) 97.8 F (36.6 C) (Oral)  Resp 14  SpO2 98% Physical Exam  Constitutional: No distress.  HENT:  Head: Normocephalic and atraumatic.  Right Ear: External ear normal.  Left Ear: External ear normal.  Eyes: Conjunctivae are normal. Right eye exhibits no discharge. Left eye exhibits no discharge. No scleral icterus.  Neck: Neck supple. No tracheal deviation present.  Cardiovascular: Normal rate, regular rhythm and intact distal pulses.   Pulmonary/Chest: Effort normal and breath sounds normal. No stridor. No respiratory distress. He has no wheezes. He has no rales.  Abdominal: Soft. Bowel sounds are normal. He exhibits no distension. There is no tenderness. There is no rebound and no guarding.  Musculoskeletal: He exhibits no edema.       Right shoulder: He exhibits decreased range of motion, tenderness and swelling.       Left shoulder: Normal.       Right elbow: Normal.      Right hip: Normal.       Left hip: Normal.       Right ankle: Normal.       Left ankle: Normal.       Cervical back: Normal.  Neurological: He is alert. He has normal strength. No cranial nerve deficit (no facial droop, extraocular movements intact, no slurred  speech) or sensory deficit. He exhibits normal muscle tone. He displays no seizure activity. Coordination normal.  Reflex Scores:      Patellar reflexes are 4+ on the right side. Equal grip strength and plantar flexion strength bilaterally  Skin: Skin is warm and dry. No rash noted.  Psychiatric: He has a normal mood and affect.  Nursing note and vitals reviewed.   ED Course  Procedures  Medications  oxyCODONE-acetaminophen (PERCOCET/ROXICET) 5-325 MG per tablet 1 tablet (1 tablet Oral Given 09/30/15 1157)  morphine 4 MG/ML injection 4 mg (4 mg Intravenous Given 09/30/15 1238)    Imaging Review Dg Shoulder Right  09/30/2015  CLINICAL DATA:  70 year old male status post fall with pain in the right shoulder EXAM: RIGHT SHOULDER - 2+ VIEW COMPARISON:  None. FINDINGS: There is an acute displaced fracture through the anatomic neck of the humerus. There is surrounding soft tissue swelling and likely hematoma. The humeral head remains located with respect to the glenoid. The visualized thorax is unremarkable. IMPRESSION: Acute displaced fracture through the anatomic neck of the humerus. Electronically Signed   By: Malachy MoanHeath  McCullough M.D.   On: 09/30/2015 13:05   I have personally reviewed and evaluated these images and lab results as part of my medical decision-making.   MDM   Final diagnoses:  Humeral surgical neck fracture, right, closed, initial encounter    Patient's x-rays show a fracture through the anatomic neck of humerus.  No evidence of dislocation. Patient was treated with pain medications.  I have ordered a shoulder immobilizer.  I discussed the findings with the patient, his wife and his daughter.  Patient will be discharged home. I'll give him a referral to an orthopedic doctor. Patient does have a history of ALS and he requested some home health assistance because of this new fracture. I have placed An order for a home health assessment.    Linwood DibblesJon Jalexa Pifer, MD 09/30/15 1415

## 2015-09-30 NOTE — ED Notes (Signed)
Bed: WA08 Expected date:  Expected time:  Means of arrival:  Comments: EMS- 70 yo shoulder dislocation

## 2015-09-30 NOTE — Discharge Instructions (Signed)
Humerus Fracture Treated With Immobilization °The humerus is the large bone in your upper arm. You have a broken (fractured) humerus. These fractures are easily diagnosed with X-rays. °TREATMENT  °Simple fractures which will heal without disability are treated with simple immobilization. Immobilization means you will wear a cast, splint, or sling. You have a fracture which will do well with immobilization. The fracture will heal well simply by being held in a good position until it is stable enough to begin range of motion exercises. Do not take part in activities which would further injure your arm.  °HOME CARE INSTRUCTIONS  °· Put ice on the injured area. °¨ Put ice in a plastic bag. °¨ Place a towel between your skin and the bag. °¨ Leave the ice on for 15-20 minutes, 03-04 times a day. °· If you have a cast: °¨ Do not scratch the skin under the cast using sharp or pointed objects. °¨ Check the skin around the cast every day. You may put lotion on any red or sore areas. °¨ Keep your cast dry and clean. °· If you have a splint: °¨ Wear the splint as directed. °¨ Keep your splint dry and clean. °¨ You may loosen the elastic around the splint if your fingers become numb, tingle, or turn cold or blue. °· If you have a sling: °¨ Wear the sling as directed. °· Do not put pressure on any part of your cast or splint until it is fully hardened. °· Your cast or splint can be protected during bathing with a plastic bag. Do not lower the cast or splint into water. °· Only take over-the-counter or prescription medicines for pain, discomfort, or fever as directed by your caregiver. °· Do range of motion exercises as instructed by your caregiver. °· Follow up as directed by your caregiver. This is very important in order to avoid permanent injury or disability and chronic pain. °SEEK IMMEDIATE MEDICAL CARE IF:  °· Your skin or nails in the injured arm turn blue or gray. °· Your arm feels cold or numb. °· You develop severe pain  in the injured arm. °· You are having problems with the medicines you were given. °MAKE SURE YOU:  °· Understand these instructions. °· Will watch your condition. °· Will get help right away if you are not doing well or get worse. °  °This information is not intended to replace advice given to you by your health care provider. Make sure you discuss any questions you have with your health care provider. °  °Document Released: 07/15/2000 Document Revised: 04/29/2014 Document Reviewed: 08/31/2014 °Elsevier Interactive Patient Education ©2016 [employer] ° °

## 2015-09-30 NOTE — ED Notes (Addendum)
Per EMS, pt complains of right shoulder pain since slipping and falling this morning. Pt's wife caught the pt by his right shoulder as he slipped, helping him lower to the floor. Pt has hx of ALS. Pt denies head injury or loss of consciousness. Pain is 10/10 in right shoulder.   Pt speaks [place], video interpreter used.

## 2015-09-30 NOTE — ED Notes (Signed)
PTAR Cancelled  

## 2015-09-30 NOTE — ED Notes (Signed)
Daughter at bedside at present time reports will transport pt home. Staff assist pt to call via wheelchair.

## 2015-10-16 ENCOUNTER — Emergency Department (HOSPITAL_COMMUNITY): Payer: Medicare Other

## 2015-10-16 ENCOUNTER — Other Ambulatory Visit: Payer: Self-pay

## 2015-10-16 ENCOUNTER — Inpatient Hospital Stay (HOSPITAL_COMMUNITY)
Admission: EM | Admit: 2015-10-16 | Discharge: 2015-10-21 | DRG: 444 | Disposition: A | Discharge status: Home Health | Payer: Medicare Other | Attending: Internal Medicine | Admitting: Internal Medicine

## 2015-10-16 ENCOUNTER — Encounter (HOSPITAL_COMMUNITY): Payer: Self-pay | Admitting: Emergency Medicine

## 2015-10-16 DIAGNOSIS — Z79899 Other long term (current) drug therapy: Secondary | ICD-10-CM | POA: Diagnosis not present

## 2015-10-16 DIAGNOSIS — S42211D Unspecified displaced fracture of surgical neck of right humerus, subsequent encounter for fracture with routine healing: Secondary | ICD-10-CM | POA: Diagnosis not present

## 2015-10-16 DIAGNOSIS — Z993 Dependence on wheelchair: Secondary | ICD-10-CM

## 2015-10-16 DIAGNOSIS — E876 Hypokalemia: Secondary | ICD-10-CM | POA: Diagnosis not present

## 2015-10-16 DIAGNOSIS — Y92009 Unspecified place in unspecified non-institutional (private) residence as the place of occurrence of the external cause: Secondary | ICD-10-CM | POA: Diagnosis not present

## 2015-10-16 DIAGNOSIS — R918 Other nonspecific abnormal finding of lung field: Secondary | ICD-10-CM | POA: Diagnosis not present

## 2015-10-16 DIAGNOSIS — E785 Hyperlipidemia, unspecified: Secondary | ICD-10-CM | POA: Diagnosis present

## 2015-10-16 DIAGNOSIS — G1221 Amyotrophic lateral sclerosis: Secondary | ICD-10-CM | POA: Diagnosis present

## 2015-10-16 DIAGNOSIS — R1012 Left upper quadrant pain: Secondary | ICD-10-CM | POA: Diagnosis not present

## 2015-10-16 DIAGNOSIS — R1032 Left lower quadrant pain: Secondary | ICD-10-CM | POA: Diagnosis not present

## 2015-10-16 DIAGNOSIS — R9389 Abnormal findings on diagnostic imaging of other specified body structures: Secondary | ICD-10-CM

## 2015-10-16 DIAGNOSIS — S42211S Unspecified displaced fracture of surgical neck of right humerus, sequela: Secondary | ICD-10-CM | POA: Diagnosis not present

## 2015-10-16 DIAGNOSIS — K810 Acute cholecystitis: Secondary | ICD-10-CM | POA: Diagnosis not present

## 2015-10-16 DIAGNOSIS — R1013 Epigastric pain: Secondary | ICD-10-CM | POA: Diagnosis not present

## 2015-10-16 DIAGNOSIS — K8050 Calculus of bile duct without cholangitis or cholecystitis without obstruction: Secondary | ICD-10-CM | POA: Diagnosis not present

## 2015-10-16 DIAGNOSIS — J9601 Acute respiratory failure with hypoxia: Secondary | ICD-10-CM | POA: Insufficient documentation

## 2015-10-16 DIAGNOSIS — R0902 Hypoxemia: Secondary | ICD-10-CM

## 2015-10-16 DIAGNOSIS — K819 Cholecystitis, unspecified: Secondary | ICD-10-CM | POA: Diagnosis present

## 2015-10-16 DIAGNOSIS — I10 Essential (primary) hypertension: Secondary | ICD-10-CM | POA: Diagnosis not present

## 2015-10-16 DIAGNOSIS — S42211A Unspecified displaced fracture of surgical neck of right humerus, initial encounter for closed fracture: Secondary | ICD-10-CM | POA: Diagnosis present

## 2015-10-16 DIAGNOSIS — R109 Unspecified abdominal pain: Secondary | ICD-10-CM | POA: Diagnosis not present

## 2015-10-16 DIAGNOSIS — S42213A Unspecified displaced fracture of surgical neck of unspecified humerus, initial encounter for closed fracture: Secondary | ICD-10-CM | POA: Diagnosis present

## 2015-10-16 DIAGNOSIS — J9811 Atelectasis: Secondary | ICD-10-CM | POA: Diagnosis not present

## 2015-10-16 DIAGNOSIS — W19XXXA Unspecified fall, initial encounter: Secondary | ICD-10-CM | POA: Diagnosis present

## 2015-10-16 DIAGNOSIS — J90 Pleural effusion, not elsewhere classified: Secondary | ICD-10-CM | POA: Diagnosis not present

## 2015-10-16 LAB — URINALYSIS, ROUTINE W REFLEX MICROSCOPIC
Bilirubin Urine: NEGATIVE
Glucose, UA: NEGATIVE mg/dL
Hgb urine dipstick: NEGATIVE
KETONES UR: NEGATIVE mg/dL
LEUKOCYTES UA: NEGATIVE
NITRITE: NEGATIVE
PROTEIN: 100 mg/dL — AB
Specific Gravity, Urine: 1.042 — ABNORMAL HIGH (ref 1.005–1.030)
pH: 6 (ref 5.0–8.0)

## 2015-10-16 LAB — COMPREHENSIVE METABOLIC PANEL
ALBUMIN: 4.2 g/dL (ref 3.5–5.0)
ALK PHOS: 116 U/L (ref 38–126)
ALT: 27 U/L (ref 17–63)
ANION GAP: 10 (ref 5–15)
AST: 30 U/L (ref 15–41)
BILIRUBIN TOTAL: 1.2 mg/dL (ref 0.3–1.2)
BUN: 12 mg/dL (ref 6–20)
CALCIUM: 8.9 mg/dL (ref 8.9–10.3)
CO2: 24 mmol/L (ref 22–32)
Chloride: 103 mmol/L (ref 101–111)
Creatinine, Ser: 0.32 mg/dL — ABNORMAL LOW (ref 0.61–1.24)
GFR calc Af Amer: >60 mL/min (ref >60)
GLUCOSE: 126 mg/dL — AB (ref 65–99)
POTASSIUM: 3.8 mmol/L (ref 3.5–5.1)
Sodium: 137 mmol/L (ref 135–145)
TOTAL PROTEIN: 8.3 g/dL — AB (ref 6.5–8.1)

## 2015-10-16 LAB — CBC WITH DIFFERENTIAL/PLATELET
BASOS ABS: 0 10*3/uL (ref 0.0–0.1)
BASOS PCT: 0 %
EOS ABS: 0.1 10*3/uL (ref 0.0–0.7)
Eosinophils Relative: 1 %
HCT: 41.8 % (ref 39.0–52.0)
Hemoglobin: 13.9 g/dL (ref 13.0–17.0)
LYMPHS ABS: 2.4 10*3/uL (ref 0.7–4.0)
Lymphocytes Relative: 19 %
MCH: 29.1 pg (ref 26.0–34.0)
MCHC: 33.3 g/dL (ref 30.0–36.0)
MCV: 87.6 fL (ref 78.0–100.0)
MONOS PCT: 5 %
Monocytes Absolute: 0.7 10*3/uL (ref 0.1–1.0)
NEUTROS ABS: 9.6 10*3/uL — AB (ref 1.7–7.7)
NEUTROS PCT: 75 %
PLATELETS: 382 10*3/uL (ref 150–400)
RBC: 4.77 MIL/uL (ref 4.22–5.81)
RDW: 13.8 % (ref 11.5–15.5)
WBC: 12.9 10*3/uL — ABNORMAL HIGH (ref 4.0–10.5)

## 2015-10-16 LAB — URINE MICROSCOPIC-ADD ON
Bacteria, UA: NONE SEEN
RBC / HPF: NONE SEEN RBC/hpf (ref 0–5)
SQUAMOUS EPITHELIAL / LPF: NONE SEEN
WBC UA: NONE SEEN WBC/hpf (ref 0–5)

## 2015-10-16 LAB — I-STAT CG4 LACTIC ACID, ED: LACTIC ACID, VENOUS: 2.63 mmol/L — AB (ref 0.5–2.0)

## 2015-10-16 LAB — LIPASE, BLOOD: LIPASE: 27 U/L (ref 11–51)

## 2015-10-16 MED ORDER — ONDANSETRON HCL 4 MG/2ML IJ SOLN: 4.0000 mg | Freq: Four times a day (QID) | INTRAMUSCULAR | Status: DC | PRN

## 2015-10-16 MED ORDER — SODIUM CHLORIDE 0.9 % IV SOLN
INTRAVENOUS | Status: DC
  Administered 2015-10-16 – 2015-10-19 (×4): via INTRAVENOUS

## 2015-10-16 MED ORDER — HYDROMORPHONE HCL 1 MG/ML IJ SOLN
0.5000 mg | INTRAMUSCULAR | Status: DC | PRN
  Administered 2015-10-17: 1 mg via INTRAVENOUS
  Filled 2015-10-16: qty 1

## 2015-10-16 MED ORDER — ONDANSETRON HCL 4 MG/2ML IJ SOLN
4.0000 mg | Freq: Once | INTRAMUSCULAR | Status: AC
  Administered 2015-10-16: 4 mg via INTRAVENOUS
  Filled 2015-10-16: qty 2

## 2015-10-16 MED ORDER — ONDANSETRON HCL 4 MG PO TABS: 4.0000 mg | ORAL_TABLET | Freq: Four times a day (QID) | ORAL | Status: DC | PRN

## 2015-10-16 MED ORDER — IOPAMIDOL (ISOVUE-300) INJECTION 61%
100.0000 mL | Freq: Once | INTRAVENOUS | Status: AC | PRN
  Administered 2015-10-16: 100 mL via INTRAVENOUS

## 2015-10-16 MED ORDER — ROSUVASTATIN CALCIUM 20 MG PO TABS
20.0000 mg | ORAL_TABLET | Freq: Every day | ORAL | Status: DC
  Administered 2015-10-18 – 2015-10-21 (×4): 20 mg via ORAL
  Filled 2015-10-16 (×5): qty 1

## 2015-10-16 MED ORDER — ATENOLOL 25 MG PO TABS
50.0000 mg | ORAL_TABLET | Freq: Every day | ORAL | Status: DC
  Administered 2015-10-17 – 2015-10-20 (×4): 50 mg via ORAL
  Filled 2015-10-16 (×5): qty 2

## 2015-10-16 MED ORDER — ACETAMINOPHEN 325 MG PO TABS
650.0000 mg | ORAL_TABLET | Freq: Four times a day (QID) | ORAL | Status: DC | PRN
  Administered 2015-10-18 (×2): 650 mg via ORAL
  Filled 2015-10-16 (×2): qty 2

## 2015-10-16 MED ORDER — HYDROMORPHONE HCL 1 MG/ML IJ SOLN
1.0000 mg | Freq: Once | INTRAMUSCULAR | Status: AC
  Administered 2015-10-16: 1 mg via INTRAVENOUS
  Filled 2015-10-16: qty 1

## 2015-10-16 MED ORDER — ACETAMINOPHEN 650 MG RE SUPP: 650.0000 mg | Freq: Four times a day (QID) | RECTAL | Status: DC | PRN

## 2015-10-16 MED ORDER — PIPERACILLIN-TAZOBACTAM 3.375 G IVPB
3.3750 g | Freq: Three times a day (TID) | INTRAVENOUS | Status: DC
Start: 2015-10-17 — End: 2015-10-21
  Administered 2015-10-17 – 2015-10-21 (×13): 3.375 g via INTRAVENOUS
  Filled 2015-10-16 (×15): qty 50

## 2015-10-16 MED ORDER — HYDRALAZINE HCL 20 MG/ML IJ SOLN
10.0000 mg | INTRAMUSCULAR | Status: DC | PRN
Start: 2015-10-16 — End: 2015-10-21

## 2015-10-16 MED ORDER — PIPERACILLIN-TAZOBACTAM 3.375 G IVPB
3.3750 g | Freq: Once | INTRAVENOUS | Status: AC
  Administered 2015-10-16: 3.375 g via INTRAVENOUS
  Filled 2015-10-16: qty 50

## 2015-10-16 MED ORDER — PREGABALIN 75 MG PO CAPS
75.0000 mg | ORAL_CAPSULE | Freq: Two times a day (BID) | ORAL | Status: DC
  Administered 2015-10-17 – 2015-10-21 (×8): 75 mg via ORAL
  Filled 2015-10-16 (×9): qty 1

## 2015-10-16 NOTE — ED Notes (Signed)
Per EMS patient reports LUQ abdominal pain which began around 1400 today.  Began having pain after eating lunch.

## 2015-10-16 NOTE — H&P (Signed)
CatawissaSao Powley ZOX:[id number] DOB: 08/12/1945 DOA: 10/16/2015     PCP: Oliver BarreJames [name], MD   Outpatient Specialists: Neurology Allena KatzPatel,  Patient coming from:   home Lives   With family    Chief Complaint: Abdominal pain started at 2 PM today after eating lunch  HPI: Thomas Raymond is a 70 y.o. male with medical history significant of ALS,  HTN, acalculus cholecystitis    Presented with 2 PM today patient developed central and radiating to the Left upper quadrant   abdominal pain. In March 2017 he was admitted for acalculous cholecystitis treated with supportive management and antibiotics at that time no operative intervention was done secondary to chronic history of ALS.  During admission ultrasound did not show any stones Heide skin at that shunt showed acalculous cholecystitis he was treated Rocephin Today he denies any fevers reports some chills no nausea no vomiting no diarrhea Decreased gas production but had 2 BM today.   IN ER: Afebrile heart rate 60 blood pressure 165/71 WBC 12.9 hemoglobin 13.9 creatinine 0.3 to lactic acid 2.63 UA concentrated but no evidence of UTI LFTs not elevated total bilirubin 1.2 Lipase 27 CT showing new air in the gallbladder with chronic thickening of the gallbladder wall now representing emphysematous cholecystitis Case was discussed with general surgery who will see in consult but requests admission to medicine service at this point feels that operative interventions is less likely  Hospitalist was called for admission for cholecystitis and abdominal pain  Review of Systems:    Pertinent positives include: abdominal pain, fatigue, weight loss   Constitutional:  No weight loss, night sweats, Fevers, chills,  HEENT:  No headaches, Difficulty swallowing,Tooth/dental problems,Sore throat,  No sneezing, itching, ear ache, nasal congestion, post nasal drip,  Cardio-vascular:  No chest pain, Orthopnea, PND, anasarca, dizziness, palpitations.no Bilateral lower  extremity swelling  GI:  No heartburn, indigestion, , nausea, vomiting, diarrhea, change in bowel habits, loss of appetite, melena, blood in stool, hematemesis Resp:  no shortness of breath at rest. No dyspnea on exertion, No excess mucus, no productive cough, No non-productive cough, No coughing up of blood.No change in color of mucus.No wheezing. Skin:  no rash or lesions. No jaundice GU:  no dysuria, change in color of urine, no urgency or frequency. No straining to urinate.  No flank pain.  Musculoskeletal:  No joint pain or no joint swelling. No decreased range of motion. No back pain.  Psych:  No change in mood or affect. No depression or anxiety. No memory loss.  Neuro: no localizing neurological complaints, no tingling, no weakness, no double vision, no gait abnormality, no slurred speech, no confusion  As per HPI otherwise 10 point review of systems negative.   Past Medical History: Past Medical History  Diagnosis Date  . ALLERGIC RHINITIS   . ALS   . HYPERLIPIDEMIA   . HYPERTENSION   . VERTIGO   . LOW BACK PAIN   . Impaired glucose tolerance 02/17/2011   Past Surgical History  Procedure Laterality Date  . Lipoma removal  2003  . Tracheostomy    . Percutaneous gastrostomy tube repalcement      04/1999 and removed 07/1999  . Compression hip screw Left 02/07/2014    Procedure: IM Nail;  Surgeon: Harvie JuniorJohn L Graves, MD;  Location: [facility];  Service: Orthopedics;  Laterality: Left;     Social History:  Ambulatory  wheelchair bound     reports that he has never smoked. He has never used smokeless  tobacco. He reports that he does not drink alcohol or use illicit drugs.  Allergies:  No Known Allergies     Family History:    Family History  Problem Relation Age of Onset  . Other Mother     Deceased  . Other Father     Deceased  . Other Son 2    x 2 starvation/illness from [place]  . Other Daughter 2    x 2 starvation/illness from refugee [place]     Medications: Prior to Admission medications   Medication Sig Start Date End Date Taking? Authorizing Provider  acetaminophen (TYLENOL) 325 MG tablet Take 2 tablets (650 mg total) by mouth every 6 (six) hours as needed for mild pain (or temp > 100). 07/21/15  Yes Emina Riebock, NP  amLODipine-benazepril (LOTREL) 10-40 MG capsule TAKE ONE CAPSULE BY MOUTH EVERY DAY 07/25/15  Yes Corwin LevinsJames W [name], MD  atenolol (TENORMIN) 50 MG tablet Take 1 tablet (50 mg total) by mouth at bedtime. 02/23/15  Yes Corwin LevinsJames W [name], MD  cetirizine (ZYRTEC) 10 MG tablet Take 1 tablet (10 mg total) by mouth daily. 02/23/15  Yes Corwin LevinsJames W [name], MD  HYDROcodone-acetaminophen (NORCO/VICODIN) 5-325 MG tablet Take 1-2 tablets by mouth every 4 (four) hours as needed. Patient taking differently: Take 1-2 tablets by mouth every 4 (four) hours as needed for moderate pain or severe pain.  09/30/15  Yes Linwood DibblesJon Knapp, MD  omeprazole (PRILOSEC) 20 MG capsule TAKE ONE CAPSULE BY MOUTH EVERY DAY Patient taking differently: TAKE 20 MG BY MOUTH EVERY DAY 09/25/15  Yes Corwin LevinsJames W [name], MD  pregabalin (LYRICA) 75 MG capsule Take 1 capsule (75 mg total) by mouth 2 (two) times daily. 08/24/15  Yes Corwin LevinsJames W [name], MD  rosuvastatin (CRESTOR) 20 MG tablet Take 1 tablet (20 mg total) by mouth daily. 09/26/14  Yes Corwin LevinsJames W [name], MD    Physical Exam: Patient Vitals for the past 24 hrs:  BP Temp Temp src Pulse Resp SpO2  10/16/15 2033 165/71 mmHg 98.4 F (36.9 C) Oral 60 15 100 %  10/16/15 1801 127/79 mmHg 97.4 F (36.3 C) Oral (!) 53 16 100 %  10/16/15 1753 - - - - - 98 %    1. General:  in No Acute distress 2. Psychological: Alert and  Oriented 3. Head/ENT:    Dry Mucous Membranes                          Head Non traumatic, neck supple                          Normal  Dentition 4. SKIN:  decreased Skin turgor,  Skin clean Dry and intact no rash 5. Heart: Regular rate and rhythm no  Murmur, Rub or gallop 6. Lungs:  Clear to auscultation bilaterally, no  wheezes or crackles   7. Abdomen: Soft, RUQ tender, Non distended 8. Lower extremities: no clubbing, cyanosis, or edema 9. Neurologically Grossly intact, moving all 4 extremities equally 10. MSK: Normal range of motion   body mass index is unknown because there is no weight on file.  Labs on Admission:   Labs on Admission: I have personally reviewed following labs and imaging studies  CBC:  Recent Labs Lab 10/16/15 1808  WBC 12.9*  NEUTROABS 9.6*  HGB 13.9  HCT 41.8  MCV 87.6  PLT 382   Basic Metabolic Panel:  Recent Labs Lab 10/16/15  1808  NA 137  K 3.8  CL 103  CO2 24  GLUCOSE 126*  BUN 12  CREATININE 0.32*  CALCIUM 8.9   GFR: CrCl cannot be calculated (Unknown ideal weight.). Liver Function Tests:  Recent Labs Lab 10/16/15 1808  AST 30  ALT 27  ALKPHOS 116  BILITOT 1.2  PROT 8.3*  ALBUMIN 4.2    Recent Labs Lab 10/16/15 1808  LIPASE 27   No results for input(s): AMMONIA in the last 168 hours. Coagulation Profile: No results for input(s): INR, PROTIME in the last 168 hours. Cardiac Enzymes: No results for input(s): CKTOTAL, CKMB, CKMBINDEX, TROPONINI in the last 168 hours. BNP (last 3 results) No results for input(s): PROBNP in the last 8760 hours. HbA1C: No results for input(s): HGBA1C in the last 72 hours. CBG: No results for input(s): GLUCAP in the last 168 hours. Lipid Profile: No results for input(s): CHOL, HDL, LDLCALC, TRIG, CHOLHDL, LDLDIRECT in the last 72 hours. Thyroid Function Tests: No results for input(s): TSH, T4TOTAL, FREET4, T3FREE, THYROIDAB in the last 72 hours. Anemia Panel: No results for input(s): VITAMINB12, FOLATE, FERRITIN, TIBC, IRON, RETICCTPCT in the last 72 hours. Urine analysis:    Component Value Date/Time   COLORURINE YELLOW 10/16/2015 2030   APPEARANCEUR CLEAR 10/16/2015 2030   LABSPEC 1.042* 10/16/2015 2030   PHURINE 6.0 10/16/2015 2030   GLUCOSEU NEGATIVE 10/16/2015 2030   GLUCOSEU NEGATIVE  02/23/2015 1507   HGBUR NEGATIVE 10/16/2015 2030   BILIRUBINUR NEGATIVE 10/16/2015 2030   KETONESUR NEGATIVE 10/16/2015 2030   PROTEINUR 100* 10/16/2015 2030   UROBILINOGEN 0.2 02/23/2015 1507   NITRITE NEGATIVE 10/16/2015 2030   LEUKOCYTESUR NEGATIVE 10/16/2015 2030   Sepsis Labs: @LABRCNTIP (procalcitonin:4,lacticidven:4) )No results found for this or any previous visit (from the past 240 hour(s)).     UA   no evidence of UTI  Lab Results  Component Value Date   HGBA1C 6.2 02/23/2015    CrCl cannot be calculated (Unknown ideal weight.).  BNP (last 3 results) No results for input(s): PROBNP in the last 8760 hours.   ECG REPORT Not obtained  There were no vitals filed for this visit.   Cultures:    Component Value Date/Time   SDES URINE, CLEAN CATCH 06/08/2013 0819   SPECREQUEST NONE 06/08/2013 0819   CULT  06/07/2013 0549    NO GROWTH 5 DAYS Performed at [facility]   REPTSTATUS 06/08/2013 FINAL 06/08/2013 0819     Radiological Exams on Admission: Ct Abdomen Pelvis W Contrast  10/16/2015  CLINICAL DATA:  Left lower quadrant pain after eating lunch today. EXAM: CT ABDOMEN AND PELVIS WITH CONTRAST TECHNIQUE: Multidetector CT imaging of the abdomen and pelvis was performed using the standard protocol following bolus administration of intravenous contrast. CONTRAST:  ISOVUE-300 IOPAMIDOL (ISOVUE-300) INJECTION 61% COMPARISON:  CT scan dated 07/18/2013 FINDINGS: Lower chest:  No acute findings. Hepatobiliary: There is air in the gallbladder. No air in the bile ducts. There is slight chronic prominence of the intra and extrahepatic bile ducts which is stable. Slight chronic thickening of the gallbladder wall. No air within the wall of the gallbladder. No liver masses. Pancreas: No mass, inflammatory changes, or other significant abnormality. Spleen: Within normal limits in size and appearance. Adrenals/Urinary Tract: Normal adrenal glands. Three small cysts in  the right kidney, unchanged, the largest being 15 mm in the lower pole. 2 tiny cysts in the left kidney. No hydronephrosis. Bladder is normal. Stomach/Bowel: The bowel appears normal including the terminal ileum  and appendix. No diverticular disease. Vascular/Lymphatic: Aortic atherosclerosis.  No adenopathy. Reproductive: Normal. Other: No free air or free fluid. Musculoskeletal: Severe atrophy of paraspinal muscles, pelvic muscles, and buttock and proximal thigh muscles. Chronic degenerative disc and joint disease in the lumbar spine. Intra medullary nail and compression screw in the proximal left femur. IMPRESSION: 1. New air in the gallbladder with chronic thickening of the gallbladder wall. This could represent emphysematous cholecystitis. 2. No other significant change since the prior exam. 3. Severe atrophy of the muscles of the pelvis, paraspinal muscles and proximal thigh muscles. Electronically Signed   By: Francene Boyers M.D.   On: 10/16/2015 20:00    Chart has been reviewed    Assessment/Plan  69 y.o. male with medical history significant of ALS,  HTN, acalculus cholecystitis here now with emphysematous cholecystitis  Present on Admission:  . Cholecystitis - will obtain ultrasound to evaluate for presence of gallstones. Defer to general surgery for being a candidate for cholecystectomy. Continue Zosyn, keep nothing by mouth  Abdominal pain atypical for cholecystitis more appears to be on the left. CT scan consistent with gaseous distention of the stomach as well as colon it is possible that that is what contributed to patient's pain. . ALS chronic patient is wheelchair bound at baseline supportive management  . Essential hypertension continue home medications once able to tolerate by mouth for now give hydralazine when necessary    Other plan as per orders.  DVT prophylaxis:  SCD    Code Status:  FULL CODE as per patient   Family Communication:   Family  at  Bedside  plan of care  was discussed with   Wife Paan [phone]  Disposition Plan:    To home once workup is complete and patient is stable   Consults called: General Surgery Dr.Gerkin  Admission status:   inpatient       Level of care  medical floor     Shaynna Husby 10/16/2015, 10:43 PM    [facility]  Pager [phone]   after 2 AM please page floor coverage PA If 7AM-7PM, please contact the day team taking care of the patient  [email or web address]  Password [email or web address]

## 2015-10-16 NOTE — ED Notes (Signed)
Pt unable to give urine sample,urinal at bedside

## 2015-10-17 ENCOUNTER — Inpatient Hospital Stay (HOSPITAL_COMMUNITY): Payer: Medicare Other

## 2015-10-17 DIAGNOSIS — R109 Unspecified abdominal pain: Secondary | ICD-10-CM

## 2015-10-17 LAB — COMPREHENSIVE METABOLIC PANEL
ALK PHOS: 136 U/L — AB (ref 38–126)
ALT: 85 U/L — ABNORMAL HIGH (ref 17–63)
ANION GAP: 8 (ref 5–15)
AST: 136 U/L — ABNORMAL HIGH (ref 15–41)
Albumin: 3.8 g/dL (ref 3.5–5.0)
BILIRUBIN TOTAL: 0.7 mg/dL (ref 0.3–1.2)
BUN: 9 mg/dL (ref 6–20)
CALCIUM: 8.4 mg/dL — AB (ref 8.9–10.3)
CO2: 23 mmol/L (ref 22–32)
Chloride: 107 mmol/L (ref 101–111)
Creatinine, Ser: <0.3 mg/dL — ABNORMAL LOW (ref 0.61–1.24)
Glucose, Bld: 154 mg/dL — ABNORMAL HIGH (ref 65–99)
POTASSIUM: 3.5 mmol/L (ref 3.5–5.1)
Sodium: 138 mmol/L (ref 135–145)
TOTAL PROTEIN: 7.7 g/dL (ref 6.5–8.1)

## 2015-10-17 LAB — MAGNESIUM: MAGNESIUM: 1.9 mg/dL (ref 1.7–2.4)

## 2015-10-17 LAB — CBC
HEMATOCRIT: 38.1 % — AB (ref 39.0–52.0)
HEMOGLOBIN: 12.9 g/dL — AB (ref 13.0–17.0)
MCH: 29.1 pg (ref 26.0–34.0)
MCHC: 33.9 g/dL (ref 30.0–36.0)
MCV: 85.8 fL (ref 78.0–100.0)
Platelets: 313 10*3/uL (ref 150–400)
RBC: 4.44 MIL/uL (ref 4.22–5.81)
RDW: 13.7 % (ref 11.5–15.5)
WBC: 13.4 10*3/uL — AB (ref 4.0–10.5)

## 2015-10-17 LAB — SURGICAL PCR SCREEN
MRSA, PCR: NEGATIVE
STAPHYLOCOCCUS AUREUS: NEGATIVE

## 2015-10-17 LAB — PROTIME-INR
INR: 1.19 (ref 0.00–1.49)
Prothrombin Time: 14.8 seconds (ref 11.6–15.2)

## 2015-10-17 LAB — PHOSPHORUS: PHOSPHORUS: 3.3 mg/dL (ref 2.5–4.6)

## 2015-10-17 LAB — LACTIC ACID, PLASMA
LACTIC ACID, VENOUS: 1.6 mmol/L (ref 0.5–1.9)
Lactic Acid, Venous: 1.8 mmol/L (ref 0.5–2.0)

## 2015-10-17 LAB — TSH: TSH: 0.629 u[IU]/mL (ref 0.350–4.500)

## 2015-10-17 MED ORDER — FENTANYL CITRATE (PF) 100 MCG/2ML IJ SOLN
INTRAMUSCULAR | Status: AC | PRN
  Administered 2015-10-17 (×2): 50 ug via INTRAVENOUS

## 2015-10-17 MED ORDER — MIDAZOLAM HCL 2 MG/2ML IJ SOLN
INTRAMUSCULAR | Status: AC | PRN
  Administered 2015-10-17: 1 mg via INTRAVENOUS

## 2015-10-17 MED ORDER — LIDOCAINE HCL 1 % IJ SOLN
INTRAMUSCULAR | Status: AC
  Filled 2015-10-17: qty 20

## 2015-10-17 MED ORDER — HYDROMORPHONE HCL 2 MG/ML IJ SOLN
INTRAMUSCULAR | Status: AC
  Filled 2015-10-17: qty 1

## 2015-10-17 MED ORDER — MIDAZOLAM HCL 2 MG/2ML IJ SOLN
INTRAMUSCULAR | Status: AC
  Filled 2015-10-17: qty 6

## 2015-10-17 MED ORDER — FENTANYL CITRATE (PF) 100 MCG/2ML IJ SOLN
INTRAMUSCULAR | Status: AC
  Filled 2015-10-17: qty 4

## 2015-10-17 MED ORDER — MEPERIDINE HCL 100 MG/ML IJ SOLN
INTRAMUSCULAR | Status: AC
  Filled 2015-10-17: qty 1

## 2015-10-17 MED ORDER — MIDAZOLAM HCL 2 MG/2ML IJ SOLN
INTRAMUSCULAR | Status: DC | PRN
  Administered 2015-10-17: 1 mg via INTRAVENOUS

## 2015-10-17 NOTE — Progress Notes (Signed)
Pharmacy Antibiotic Note  Thomas Raymond is a 70 y.o. male admitted on 10/16/2015 with Intra-abdominal infection.  Pharmacy has been consulted for zosyn dosing.  Plan: Zosyn 3.375g IV q8h (4 hour infusion).  Height: 5\' 5"  (165.1 cm) Weight: 151 lb (68.493 kg) IBW/kg (Calculated) : 61.5  Temp (24hrs), Avg:98.8 F (37.1 C), Min:97.4 F (36.3 C), Max:99.6 F (37.6 C)   Recent Labs Lab 10/16/15 1808 10/16/15 1819 10/16/15 2333 10/17/15 0222  WBC 12.9*  --   --  13.4*  CREATININE 0.32*  --   --  <0.30*  LATICACIDVEN  --  2.63* 1.8 1.6    CrCl cannot be calculated (Patient has no serum creatinine result on file.).    No Known Allergies  Antimicrobials this admission: Zosyn 6/27 >>  Dose adjustments this admission: -  Microbiology results: Pending  Thank you for allowing pharmacy to be a part of this patient's care.  Aleene DavidsonGrimsley Jr, Willard Farquharson Crowford 10/17/2015 4:50 AM

## 2015-10-17 NOTE — Procedures (Signed)
Interventional Radiology Procedure Note  Procedure: Perc cholecystostomy tube (53F) placement under CT guidance.  Bile is bloody.  There has been evolution to frank emphysematous cholecystitis.   Complications: None  Estimated Blood Loss: 0  Recommendations: - Cultures sent - Tube to bag drainage  Signed,  Sterling BigHeath K. Penelopi Mikrut, MD

## 2015-10-17 NOTE — Progress Notes (Signed)
PROGRESS NOTE    McCarrSao Ratterree  [id number] DOB: 09/19/1945 DOA: 10/16/2015 PCP: Oliver BarreJames [name], MD  Outpatient Specialists:   Brief Narrative: 70 y.o. male with medical history significant of ALS, HTN, acalculus cholecystitis, presents with abdominal pain. In March 2017 he was admitted for acalculous cholecystitis treated with supportive management and antibiotics at that time no operative intervention was done secondary to chronic history of ALS. During admission ultrasound did not show any stones HIDA Scan showed acalculous cholecystitis, and patient was treated Rocephin.   Assessment & Plan:   Active Problems:   ALS - Continue current management.   Essential hypertension - Optimize   Cholecystitis - Surgical input is appreciated. IR already consulted for possible drainage. Continue antibiotics.  DVT prophylaxis: SCD   Code Status: FULL CODE as per patient   Family Communication: Family at Bedside plan of care was discussed with Wife Paan [phone]  Disposition Plan: To home once workup is complete and patient is stable   Consults called: General Surgery Dr.Gerkin  Admission status: inpatient   Consultants: Surgery and IR.  Subjective: No fever or chills. No nausea or vomiting. Abdominal pain is improving.  Objective: Filed Vitals:   10/16/15 2254 10/16/15 2336 10/17/15 0335 10/17/15 0840  BP: 168/81 157/57 152/57 147/71  Pulse: 73 78 71 80  Temp:  99.6 F (37.6 C) 99.6 F (37.6 C) 99.3 F (37.4 C)  TempSrc:  Oral Oral Oral  Resp: 16 20 20 18   Height:   5\' 5"  (1.651 m)   Weight:   68.493 kg (151 lb)   SpO2: 98% 95% 94% 92%   No intake or output data in the 24 hours ending 10/17/15 1323 Filed Weights   10/17/15 0335  Weight: 68.493 kg (151 lb)    Examination:  General exam: Appears calm and comfortable. Dry buccal mucosa.  Respiratory system: Clear to auscultation.   Cardiovascular system: S1 & S2 Gastrointestinal system: Vague abdominal  tenderness. Organs are difficult to assess. Central nervous system: Alert and oriented. Limited motor movement due to ALS Extremities: No edema  Data Reviewed: I have personally reviewed following labs and imaging studies  CBC:  Recent Labs Lab 10/16/15 1808 10/17/15 0222  WBC 12.9* 13.4*  NEUTROABS 9.6*  --   HGB 13.9 12.9*  HCT 41.8 38.1*  MCV 87.6 85.8  PLT 382 313   Basic Metabolic Panel:  Recent Labs Lab 10/16/15 1808 10/17/15 0222  NA 137 138  K 3.8 3.5  CL 103 107  CO2 24 23  GLUCOSE 126* 154*  BUN 12 9  CREATININE 0.32* <0.30*  CALCIUM 8.9 8.4*  MG  --  1.9  PHOS  --  3.3   GFR: CrCl cannot be calculated (Patient has no serum creatinine result on file.). Liver Function Tests:  Recent Labs Lab 10/16/15 1808 10/17/15 0222  AST 30 136*  ALT 27 85*  ALKPHOS 116 136*  BILITOT 1.2 0.7  PROT 8.3* 7.7  ALBUMIN 4.2 3.8    Recent Labs Lab 10/16/15 1808  LIPASE 27   No results for input(s): AMMONIA in the last 168 hours. Coagulation Profile:  Recent Labs Lab 10/17/15 1233  INR 1.19   Cardiac Enzymes: No results for input(s): CKTOTAL, CKMB, CKMBINDEX, TROPONINI in the last 168 hours. BNP (last 3 results) No results for input(s): PROBNP in the last 8760 hours. HbA1C: No results for input(s): HGBA1C in the last 72 hours. CBG: No results for input(s): GLUCAP in the last 168 hours. Lipid Profile: No  results for input(s): CHOL, HDL, LDLCALC, TRIG, CHOLHDL, LDLDIRECT in the last 72 hours. Thyroid Function Tests:  Recent Labs  10/17/15 0222  TSH 0.629   Anemia Panel: No results for input(s): VITAMINB12, FOLATE, FERRITIN, TIBC, IRON, RETICCTPCT in the last 72 hours. Urine analysis:    Component Value Date/Time   COLORURINE YELLOW 10/16/2015 2030   APPEARANCEUR CLEAR 10/16/2015 2030   LABSPEC 1.042* 10/16/2015 2030   PHURINE 6.0 10/16/2015 2030   GLUCOSEU NEGATIVE 10/16/2015 2030   GLUCOSEU NEGATIVE 02/23/2015 1507   HGBUR NEGATIVE  10/16/2015 2030   BILIRUBINUR NEGATIVE 10/16/2015 2030   KETONESUR NEGATIVE 10/16/2015 2030   PROTEINUR 100* 10/16/2015 2030   UROBILINOGEN 0.2 02/23/2015 1507   NITRITE NEGATIVE 10/16/2015 2030   LEUKOCYTESUR NEGATIVE 10/16/2015 2030   Sepsis Labs: @LABRCNTIP (procalcitonin:4,lacticidven:4)  ) Recent Results (from the past 240 hour(s))  Surgical PCR screen     Status: None   Collection Time: 10/16/15 11:58 PM  Result Value Ref Range Status   MRSA, PCR NEGATIVE NEGATIVE Final   Staphylococcus aureus NEGATIVE NEGATIVE Final    Comment:        The Xpert SA Assay (FDA approved for NASAL specimens in patients over 71 years of age), is one component of a comprehensive surveillance program.  Test performance has been validated by [facility] for patients greater than or equal to 71 year old. It is not intended to diagnose infection nor to guide or monitor treatment.          Radiology Studies: Ct Abdomen Pelvis W Contrast  10/16/2015  CLINICAL DATA:  Left lower quadrant pain after eating lunch today. EXAM: CT ABDOMEN AND PELVIS WITH CONTRAST TECHNIQUE: Multidetector CT imaging of the abdomen and pelvis was performed using the standard protocol following bolus administration of intravenous contrast. CONTRAST:  100mL ISOVUE-300 IOPAMIDOL (ISOVUE-300) INJECTION 61% COMPARISON:  CT scan dated 07/18/2013 FINDINGS: Lower chest:  No acute findings. Hepatobiliary: There is air in the gallbladder. No air in the bile ducts. There is slight chronic prominence of the intra and extrahepatic bile ducts which is stable. Slight chronic thickening of the gallbladder wall. No air within the wall of the gallbladder. No liver masses. Pancreas: No mass, inflammatory changes, or other significant abnormality. Spleen: Within normal limits in size and appearance. Adrenals/Urinary Tract: Normal adrenal glands. Three small cysts in the right kidney, unchanged, the largest being 15 mm in the lower pole. 2 tiny  cysts in the left kidney. No hydronephrosis. Bladder is normal. Stomach/Bowel: The bowel appears normal including the terminal ileum and appendix. No diverticular disease. Vascular/Lymphatic: Aortic atherosclerosis.  No adenopathy. Reproductive: Normal. Other: No free air or free fluid. Musculoskeletal: Severe atrophy of paraspinal muscles, pelvic muscles, and buttock and proximal thigh muscles. Chronic degenerative disc and joint disease in the lumbar spine. Intra medullary nail and compression screw in the proximal left femur. IMPRESSION: 1. New air in the gallbladder with chronic thickening of the gallbladder wall. This could represent emphysematous cholecystitis. 2. No other significant change since the prior exam. 3. Severe atrophy of the muscles of the pelvis, paraspinal muscles and proximal thigh muscles. Electronically Signed   By: Francene BoyersJames  Maxwell M.D.   On: 10/16/2015 20:00        Scheduled Meds: . atenolol  50 mg Oral QHS  . piperacillin-tazobactam (ZOSYN)  IV  3.375 g Intravenous Q8H  . pregabalin  75 mg Oral BID  . rosuvastatin  20 mg Oral Daily   Continuous Infusions: . sodium chloride 125 mL/hr  at 10/16/15 2344     LOS: 1 day    Time spent: 25 Minutes    Berton Mount, MD  [facility] Pager #: [phone] 7PM-7AM contact night coverage as above

## 2015-10-17 NOTE — Care Management Note (Signed)
Case Management Note  Patient Details  Name: Rodena MedinSao Renwick MRN: [id number] Date of Birth: 11/06/1945  Subjective/Objective:        70 yo admitted with Cholecystitis. Hx of ALS.            Action/Plan: Pt from home with family.  He mobilizes with a wheelchair.  Chart reviewed and CM following for DC needs.  Expected Discharge Date:                  Expected Discharge Plan:  Home/Self Care  In-House Referral:     Discharge planning Services  CM Consult  Post Acute Care Choice:    Choice offered to:     DME Arranged:    DME Agency:     HH Arranged:    HH Agency:     Status of Service:  In process, will continue to follow  If discussed at Long Length of Stay Meetings, dates discussed:    Additional CommentsBartholome Bill:  Jatin Naumann H, RN 10/17/2015, 1:03 PM  [phone]

## 2015-10-17 NOTE — Consult Note (Signed)
Reason for Consult:  Recurrent acalculous cholecystitis Referring Physician: Toy Baker, MD PCP:  Cathlean Cower, MD  Thomas Raymond is an 70 y.o. male.  Hx from chart and interpretor [phone]  HPI: Thomas Raymond hospitalized 3/29-31/17 with acalculous cholecystitis. Ultrasound ws consistent with cholecystitis, without any gallstone.  HIDA was also positive for cholecystitis.  Thomas Raymond has a hx of ALS and is wheelchair bound.  Thomas Raymond was started on IV antibiotics and improved.  Because of his ALS no further intervention was recommended.  Thomas Raymond went home on an additional week of Augmentin.  Thomas Raymond returned to the ED last PM with LUQ pain that started after eating lunch, around 1400 yesterday.   Work up in the ED shows Tm 99.6, VSS.  WBC 12.9, lactate 2.63, LFT's are normal.  Lipase 27.  UA is normal. CT scan shows:  There is air in the gallbladder. No air in the bile ducts. There is slight chronic prominence of the intra and extrahepatic bile ducts which is stable. Slight chronic thickening of the gallbladder wall. No air within the wall of the gallbladder.  No liver masses. Pancreas: No mass, inflammatory changes, or other significant abnormality.  This could represent emphysematous cholecystitis.  We are ask to see.   Past Medical History  Diagnosis Date  . ALLERGIC RHINITIS   . ALS   . HYPERLIPIDEMIA   . HYPERTENSION   . VERTIGO   . LOW BACK PAIN   . Impaired glucose tolerance 02/17/2011    Past Surgical History  Procedure Laterality Date  . Lipoma removal  2003  . Tracheostomy    . Percutaneous gastrostomy tube repalcement      04/1999 and removed 07/1999  . Compression hip screw Left 02/07/2014    Procedure: IM Nail;  Surgeon: Alta Corning, MD;  Location: [facility];  Service: Orthopedics;  Laterality: Left;    Family History  Problem Relation Age of Onset  . Other Mother     Deceased  . Other Father     Deceased  . Other Son 2    x 2 starvation/illness from [place]  . Other Daughter 2    x 2  starvation/illness from refugee [place]    Social History:  reports that Thomas Raymond has never smoked. Thomas Raymond has never used smokeless tobacco. Thomas Raymond reports that Thomas Raymond does not drink alcohol or use illicit drugs.  Allergies: No Known Allergies  Medications:  Prior to Admission:  Prescriptions prior to admission  Medication Sig Dispense Refill Last Dose  . acetaminophen (TYLENOL) 325 MG tablet Take 2 tablets (650 mg total) by mouth every 6 (six) hours as needed for mild pain (or temp > 100).   UNKNOWN  . amLODipine-benazepril (LOTREL) 10-40 MG capsule TAKE ONE CAPSULE BY MOUTH EVERY DAY 90 capsule 2 10/16/2015 at Unknown time  . atenolol (TENORMIN) 50 MG tablet Take 1 tablet (50 mg total) by mouth at bedtime. 90 tablet 1 10/15/2015 at 2100  . cetirizine (ZYRTEC) 10 MG tablet Take 1 tablet (10 mg total) by mouth daily. 90 tablet 1 10/16/2015 at Unknown time  . HYDROcodone-acetaminophen (NORCO/VICODIN) 5-325 MG tablet Take 1-2 tablets by mouth every 4 (four) hours as needed. (Thomas Raymond taking differently: Take 1-2 tablets by mouth every 4 (four) hours as needed for moderate pain or severe pain. ) 30 tablet 0 Past Week at Unknown time  . omeprazole (PRILOSEC) 20 MG capsule TAKE ONE CAPSULE BY MOUTH EVERY DAY (Thomas Raymond taking differently: TAKE 20 MG BY MOUTH EVERY DAY) 90 capsule 1  10/16/2015 at Unknown time  . pregabalin (LYRICA) 75 MG capsule Take 1 capsule (75 mg total) by mouth 2 (two) times daily. 60 capsule 5 10/16/2015 at Unknown time  . rosuvastatin (CRESTOR) 20 MG tablet Take 1 tablet (20 mg total) by mouth daily. 90 tablet 2 10/16/2015 at Unknown time   Scheduled: . atenolol  50 mg Oral QHS  . piperacillin-tazobactam (ZOSYN)  IV  3.375 g Intravenous Q8H  . pregabalin  75 mg Oral BID  . rosuvastatin  20 mg Oral Daily   Continuous: . sodium chloride 125 mL/hr at 10/16/15 2344   VWA:[id number] **OR** acetaminophen, hydrALAZINE, HYDROmorphone (DILAUDID) injection, ondansetron **OR** ondansetron (ZOFRAN)  IV Anti-infectives    Start     Dose/Rate Route Frequency Ordered Stop   10/17/15 0600  piperacillin-tazobactam (ZOSYN) IVPB 3.375 g     3.375 g 12.5 mL/hr over 240 Minutes Intravenous Every 8 hours 10/16/15 2335     10/16/15 2115  piperacillin-tazobactam (ZOSYN) IVPB 3.375 g     3.375 g 12.5 mL/hr over 240 Minutes Intravenous  Once 10/16/15 2103 10/16/15 2128      Results for orders placed or performed during the hospital encounter of 10/16/15 (from the past 48 hour(s))  Comprehensive metabolic panel     Status: Abnormal   Collection Time: 10/16/15  6:08 PM  Result Value Ref Range   Sodium 137 135 - 145 mmol/L   Potassium 3.8 3.5 - 5.1 mmol/L   Chloride 103 101 - 111 mmol/L   CO2 24 22 - 32 mmol/L   Glucose, Bld 126 (H) 65 - 99 mg/dL   BUN 12 6 - 20 mg/dL   Creatinine, Ser 0.32 (L) 0.61 - 1.24 mg/dL   Calcium 8.9 8.9 - 10.3 mg/dL   Total Protein 8.3 (H) 6.5 - 8.1 g/dL   Albumin 4.2 3.5 - 5.0 g/dL   AST 30 15 - 41 U/L   ALT 27 17 - 63 U/L   Alkaline Phosphatase 116 38 - 126 U/L   Total Bilirubin 1.2 0.3 - 1.2 mg/dL   GFR calc non Af Amer >60 >60 mL/min   GFR calc Af Amer >60 >60 mL/min    Comment: (NOTE) The eGFR has been calculated using the CKD EPI equation. This calculation has not been validated in all clinical situations. eGFR's persistently <60 mL/min signify possible Chronic Kidney Disease.    Anion gap 10 5 - 15  Lipase, blood     Status: None   Collection Time: 10/16/15  6:08 PM  Result Value Ref Range   Lipase 27 11 - 51 U/L  CBC with Differential     Status: Abnormal   Collection Time: 10/16/15  6:08 PM  Result Value Ref Range   WBC 12.9 (H) 4.0 - 10.5 K/uL   RBC 4.77 4.22 - 5.81 MIL/uL   Hemoglobin 13.9 13.0 - 17.0 g/dL   HCT 41.8 39.0 - 52.0 %   MCV 87.6 78.0 - 100.0 fL   MCH 29.1 26.0 - 34.0 pg   MCHC 33.3 30.0 - 36.0 g/dL   RDW 13.8 11.5 - 15.5 %   Platelets 382 150 - 400 K/uL   Neutrophils Relative % 75 %   Neutro Abs 9.6 (H) 1.7 - 7.7 K/uL    Lymphocytes Relative 19 %   Lymphs Abs 2.4 0.7 - 4.0 K/uL   Monocytes Relative 5 %   Monocytes Absolute 0.7 0.1 - 1.0 K/uL   Eosinophils Relative 1 %   Eosinophils Absolute 0.1 0.0 -  0.7 K/uL   Basophils Relative 0 %   Basophils Absolute 0.0 0.0 - 0.1 K/uL  I-Stat CG4 Lactic Acid, ED     Status: Abnormal   Collection Time: 10/16/15  6:19 PM  Result Value Ref Range   Lactic Acid, Venous 2.63 (HH) 0.5 - 2.0 mmol/L   Comment NOTIFIED PHYSICIAN   Urinalysis, Routine w reflex microscopic     Status: Abnormal   Collection Time: 10/16/15  8:30 PM  Result Value Ref Range   Color, Urine YELLOW YELLOW   APPearance CLEAR CLEAR   Specific Gravity, Urine 1.042 (H) 1.005 - 1.030   pH 6.0 5.0 - 8.0   Glucose, UA NEGATIVE NEGATIVE mg/dL   Hgb urine dipstick NEGATIVE NEGATIVE   Bilirubin Urine NEGATIVE NEGATIVE   Ketones, ur NEGATIVE NEGATIVE mg/dL   Protein, ur 100 (A) NEGATIVE mg/dL   Nitrite NEGATIVE NEGATIVE   Leukocytes, UA NEGATIVE NEGATIVE  Urine microscopic-add on     Status: None   Collection Time: 10/16/15  8:30 PM  Result Value Ref Range   Squamous Epithelial / LPF NONE SEEN NONE SEEN   WBC, UA NONE SEEN 0 - 5 WBC/hpf   RBC / HPF NONE SEEN 0 - 5 RBC/hpf   Bacteria, UA NONE SEEN NONE SEEN  Lactic acid, plasma     Status: None   Collection Time: 10/16/15 11:33 PM  Result Value Ref Range   Lactic Acid, Venous 1.8 0.5 - 2.0 mmol/L  Surgical PCR screen     Status: None   Collection Time: 10/16/15 11:58 PM  Result Value Ref Range   MRSA, PCR NEGATIVE NEGATIVE   Staphylococcus aureus NEGATIVE NEGATIVE    Comment:        The Xpert SA Assay (FDA approved for NASAL specimens in patients over 34 years of age), is one component of a comprehensive surveillance program.  Test performance has been validated by [facility] for patients greater than or equal to 59 year old. It is not intended to diagnose infection nor to guide or monitor treatment.   Lactic acid, plasma     Status:  None   Collection Time: 10/17/15  2:22 AM  Result Value Ref Range   Lactic Acid, Venous 1.6 0.5 - 1.9 mmol/L  Magnesium     Status: None   Collection Time: 10/17/15  2:22 AM  Result Value Ref Range   Magnesium 1.9 1.7 - 2.4 mg/dL  Phosphorus     Status: None   Collection Time: 10/17/15  2:22 AM  Result Value Ref Range   Phosphorus 3.3 2.5 - 4.6 mg/dL  TSH     Status: None   Collection Time: 10/17/15  2:22 AM  Result Value Ref Range   TSH 0.629 0.350 - 4.500 uIU/mL  Comprehensive metabolic panel     Status: Abnormal   Collection Time: 10/17/15  2:22 AM  Result Value Ref Range   Sodium 138 135 - 145 mmol/L   Potassium 3.5 3.5 - 5.1 mmol/L   Chloride 107 101 - 111 mmol/L   CO2 23 22 - 32 mmol/L   Glucose, Bld 154 (H) 65 - 99 mg/dL   BUN 9 6 - 20 mg/dL   Creatinine, Ser <0.30 (L) 0.61 - 1.24 mg/dL   Calcium 8.4 (L) 8.9 - 10.3 mg/dL   Total Protein 7.7 6.5 - 8.1 g/dL   Albumin 3.8 3.5 - 5.0 g/dL   AST 136 (H) 15 - 41 U/L   ALT 85 (H) 17 -  63 U/L   Alkaline Phosphatase 136 (H) 38 - 126 U/L   Total Bilirubin 0.7 0.3 - 1.2 mg/dL   GFR calc non Af Amer NOT CALCULATED >60 mL/min   GFR calc Af Amer NOT CALCULATED >60 mL/min    Comment: (NOTE) The eGFR has been calculated using the CKD EPI equation. This calculation has not been validated in all clinical situations. eGFR's persistently <60 mL/min signify possible Chronic Kidney Disease.    Anion gap 8 5 - 15  CBC     Status: Abnormal   Collection Time: 10/17/15  2:22 AM  Result Value Ref Range   WBC 13.4 (H) 4.0 - 10.5 K/uL   RBC 4.44 4.22 - 5.81 MIL/uL   Hemoglobin 12.9 (L) 13.0 - 17.0 g/dL   HCT 38.1 (L) 39.0 - 52.0 %   MCV 85.8 78.0 - 100.0 fL   MCH 29.1 26.0 - 34.0 pg   MCHC 33.9 30.0 - 36.0 g/dL   RDW 13.7 11.5 - 15.5 %   Platelets 313 150 - 400 K/uL    Ct Abdomen Pelvis W Contrast  10/16/2015  CLINICAL DATA:  Left lower quadrant pain after eating lunch today. EXAM: CT ABDOMEN AND PELVIS WITH CONTRAST TECHNIQUE:  Multidetector CT imaging of the abdomen and pelvis was performed using the standard protocol following bolus administration of intravenous contrast. CONTRAST:  189m ISOVUE-300 IOPAMIDOL (ISOVUE-300) INJECTION 61% COMPARISON:  CT scan dated 07/18/2013 FINDINGS: Lower chest:  No acute findings. Hepatobiliary: There is air in the gallbladder. No air in the bile ducts. There is slight chronic prominence of the intra and extrahepatic bile ducts which is stable. Slight chronic thickening of the gallbladder wall. No air within the wall of the gallbladder. No liver masses. Pancreas: No mass, inflammatory changes, or other significant abnormality. Spleen: Within normal limits in size and appearance. Adrenals/Urinary Tract: Normal adrenal glands. Three small cysts in the right kidney, unchanged, the largest being 15 mm in the lower pole. 2 tiny cysts in the left kidney. No hydronephrosis. Bladder is normal. Stomach/Bowel: The bowel appears normal including the terminal ileum and appendix. No diverticular disease. Vascular/Lymphatic: Aortic atherosclerosis.  No adenopathy. Reproductive: Normal. Other: No free air or free fluid. Musculoskeletal: Severe atrophy of paraspinal muscles, pelvic muscles, and buttock and proximal thigh muscles. Chronic degenerative disc and joint disease in the lumbar spine. Intra medullary nail and compression screw in the proximal left femur. IMPRESSION: 1. New air in the gallbladder with chronic thickening of the gallbladder wall. This could represent emphysematous cholecystitis. 2. No other significant change since the prior exam. 3. Severe atrophy of the muscles of the pelvis, paraspinal muscles and proximal thigh muscles. Electronically Signed   By: JLorriane ShireM.D.   On: 10/16/2015 20:00    Review of Systems  Constitutional: Negative for fever and chills.  HENT: Negative.   Eyes: Negative.   Respiratory: Negative.   Cardiovascular: Positive for leg swelling (if Thomas Raymond is sitting a long  time.).  Gastrointestinal: Positive for abdominal pain (pain is mostly in the left upper quadrant) and constipation (chronic issue). Negative for heartburn, nausea, vomiting, diarrhea, blood in stool and melena.  Genitourinary:       Nocturia 3-4 times per night  Musculoskeletal: Positive for back pain.       Bilateral lower leg weakness.  Thomas Raymond is pretty much wheel chair bound at this point.  Skin: Negative.   Neurological: Positive for focal weakness and weakness. Negative for dizziness, tingling, tremors, sensory change, seizures and  loss of consciousness.  Endo/Heme/Allergies: Negative.   Psychiatric/Behavioral: Negative.    Blood pressure 152/57, pulse 71, temperature 99.6 F (37.6 C), temperature source Oral, resp. rate 20, height 5' 5"  (1.651 m), weight 68.493 kg (151 lb), SpO2 94 %. Physical Exam  Constitutional: Thomas Raymond is oriented to person, place, and time. Thomas Raymond appears well-developed and well-nourished. No distress.  HENT:  Head: Normocephalic and atraumatic.  Mouth/Throat: No oropharyngeal exudate.  Eyes: Right eye exhibits no discharge. Left eye exhibits no discharge. No scleral icterus.  Neck: Normal range of motion. Neck supple. No JVD present. No tracheal deviation present. No thyromegaly present.  Cardiovascular: Normal rate, regular rhythm, normal heart sounds and intact distal pulses.   No murmur heard. Respiratory: Effort normal and breath sounds normal. No respiratory distress. Thomas Raymond has no wheezes. Thomas Raymond has no rales. Thomas Raymond exhibits no tenderness.  GI: Soft. Thomas Raymond exhibits no distension and no mass. There is tenderness (tender both right and left upper quadrants). There is no rebound and no guarding.  Diminished bowel sounds Gastrostomy site on left and another scar to the left upper abdomen  Musculoskeletal: Thomas Raymond exhibits no edema or tenderness.  Sling right arm with marked ecchymosis right shoulder and upper arm.  Lymphadenopathy:    Thomas Raymond has no cervical adenopathy.  Neurological: Thomas Raymond is  alert and oriented to person, place, and time. No cranial nerve deficit.  Both lower legs very weak, Thomas Raymond can only lift them about 6 inches off the bed.  Skin: Skin is warm and dry. No rash noted. Thomas Raymond is not diaphoretic. No erythema. No pallor.  Psychiatric: Thomas Raymond has a normal mood and affect. His behavior is normal. Judgment and thought content normal.    Assessment/Plan: Recurrent cholecystitis Amyotrophic Lateral sclerosis Hypertension Hyperlipidemia   PLAN:  CT reviewed by Dr. Marcello Moores.  She recommends we ask IR to see if they can drain gallbladder.  This along with antibiotics would be the primary recommendation.  If IR cannot drain the gallbladder then antibiotics alone would be the second line of treatment.  With the ALS, anesthesia has a very high morbidity.  We will follow with you.    JENNINGS,WILLARD 10/17/2015, 8:34 AM

## 2015-10-17 NOTE — Consult Note (Signed)
Chief Complaint: Patient was seen in consultation today for percutaneous cholecystostomy Chief Complaint  Patient presents with  . Abdominal Pain    Referring Physician(s): Thomas,A  Supervising Physician: Malachy Moan  Patient Status: Inpatient  History of Present Illness: Thomas Raymond is a 70 y.o. male with history of ALS, hyperlipidemia, hypertension, and recent hospitalization in late March 2017 for acalculous cholecystitis. He was treated with antibiotic therapy and no further surgical intervention was performed at the time due to patient's ALS. Patient returned to the ED yesterday evening with upper abdominal pain that began after eating lunch. Prior HIDA scan in March 2017 showed lack of visualization of the gallbladder .Subsequent CT scan of the abdomen and pelvis 10/16/15 revealed new air in the gallbladder with chronic thickening of the gallbladder wall ,possibly representing emphysematous cholecystitis. Patient was reevaluated by surgery and request has now been received for percutaneous cholecystostomy. Temperature currently 99.3, CBC 13.4, hemoglobin 12.9, platelets 313K, creatinine 0.30, total bilirubin 0.7, additional LFTs elevated, PT/INR 14.8/1.19.   Past Medical History  Diagnosis Date  . ALLERGIC RHINITIS   . ALS   . HYPERLIPIDEMIA   . HYPERTENSION   . VERTIGO   . LOW BACK PAIN   . Impaired glucose tolerance 02/17/2011    Past Surgical History  Procedure Laterality Date  . Lipoma removal  2003  . Tracheostomy    . Percutaneous gastrostomy tube repalcement      04/1999 and removed 07/1999  . Compression hip screw Left 02/07/2014    Procedure: IM Nail;  Surgeon: Harvie Junior, MD;  Location: [facility];  Service: Orthopedics;  Laterality: Left;    Allergies: Review of patient's allergies indicates no known allergies.  Medications: Prior to Admission medications   Medication Sig Start Date End Date Taking? Authorizing Provider  acetaminophen (TYLENOL)  325 MG tablet Take 2 tablets (650 mg total) by mouth every 6 (six) hours as needed for mild pain (or temp > 100). 07/21/15  Yes Emina Riebock, NP  amLODipine-benazepril (LOTREL) 10-40 MG capsule TAKE ONE CAPSULE BY MOUTH EVERY DAY 07/25/15  Yes Corwin Levins, MD  atenolol (TENORMIN) 50 MG tablet Take 1 tablet (50 mg total) by mouth at bedtime. 02/23/15  Yes Corwin Levins, MD  cetirizine (ZYRTEC) 10 MG tablet Take 1 tablet (10 mg total) by mouth daily. 02/23/15  Yes Corwin Levins, MD  HYDROcodone-acetaminophen (NORCO/VICODIN) 5-325 MG tablet Take 1-2 tablets by mouth every 4 (four) hours as needed. Patient taking differently: Take 1-2 tablets by mouth every 4 (four) hours as needed for moderate pain or severe pain.  09/30/15  Yes Linwood Dibbles, MD  omeprazole (PRILOSEC) 20 MG capsule TAKE ONE CAPSULE BY MOUTH EVERY DAY Patient taking differently: TAKE 20 MG BY MOUTH EVERY DAY 09/25/15  Yes Corwin Levins, MD  pregabalin (LYRICA) 75 MG capsule Take 1 capsule (75 mg total) by mouth 2 (two) times daily. 08/24/15  Yes Corwin Levins, MD  rosuvastatin (CRESTOR) 20 MG tablet Take 1 tablet (20 mg total) by mouth daily. 09/26/14  Yes Corwin Levins, MD     Family History  Problem Relation Age of Onset  . Other Mother     Deceased  . Other Father     Deceased  . Other Son 2    x 2 starvation/illness from refugee [place]  . Other Daughter 2    x 2 starvation/illness from refugee camp    Social History   Social History  . Marital  Status: Widowed    Spouse Name: N/A  . Number of Children: 4 D  . Years of Education: N/A   Occupational History  . disabled    Social History Main Topics  . Smoking status: Never Smoker   . Smokeless tobacco: Never Used  . Alcohol Use: No  . Drug Use: No  . Sexual Activity: No   Other Topics Concern  . None   Social History Narrative   He lives with wife.   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  He went on disability in 2000 after diagnosis of ALS.   He  moved from [place] in 1985.    Review of Systems see above; currently denies chest pain, dyspnea, cough,  nausea, vomiting or abnormal bleeding. Positive for upper abdominal pain, bilateral lower leg weakness secondary to ALS, occasional back pain, nocturia.  Vital Signs: BP 147/71 mmHg  Pulse 80  Temp(Src) 99.3 F (37.4 C) (Oral)  Resp 18  Ht 5\' 5"  (1.651 m)  Wt 151 lb (68.493 kg)  BMI 25.13 kg/m2  SpO2 92%  Physical Exam patient awake, alert. Chest clear to auscultation bilaterally. Heart with regular rate and rhythm. Abdomen soft, few bowel sounds, tender epigastric as well as right upper and left upper quadrant regions, left upper quadrant scar noted from prior gastrostomy tube; right upper extremity in sling- patient has known acute displaced fracture through the neck of the humerus; noted weakness in both lower extremities but not significantly edematous.  Mallampati Score:     Imaging: Dg Shoulder Right  09/30/2015  CLINICAL DATA:  70 year old male status post fall with pain in the right shoulder EXAM: RIGHT SHOULDER - 2+ VIEW COMPARISON:  None. FINDINGS: There is an acute displaced fracture through the anatomic neck of the humerus. There is surrounding soft tissue swelling and likely hematoma. The humeral head remains located with respect to the glenoid. The visualized thorax is unremarkable. IMPRESSION: Acute displaced fracture through the anatomic neck of the humerus. Electronically Signed   By: Malachy MoanHeath  McCullough M.D.   On: 09/30/2015 13:05   Ct Abdomen Pelvis W Contrast  10/16/2015  CLINICAL DATA:  Left lower quadrant pain after eating lunch today. EXAM: CT ABDOMEN AND PELVIS WITH CONTRAST TECHNIQUE: Multidetector CT imaging of the abdomen and pelvis was performed using the standard protocol following bolus administration of intravenous contrast. CONTRAST:  100mL ISOVUE-300 IOPAMIDOL (ISOVUE-300) INJECTION 61% COMPARISON:  CT scan dated 07/18/2013 FINDINGS: Lower chest:  No  acute findings. Hepatobiliary: There is air in the gallbladder. No air in the bile ducts. There is slight chronic prominence of the intra and extrahepatic bile ducts which is stable. Slight chronic thickening of the gallbladder wall. No air within the wall of the gallbladder. No liver masses. Pancreas: No mass, inflammatory changes, or other significant abnormality. Spleen: Within normal limits in size and appearance. Adrenals/Urinary Tract: Normal adrenal glands. Three small cysts in the right kidney, unchanged, the largest being 15 mm in the lower pole. 2 tiny cysts in the left kidney. No hydronephrosis. Bladder is normal. Stomach/Bowel: The bowel appears normal including the terminal ileum and appendix. No diverticular disease. Vascular/Lymphatic: Aortic atherosclerosis.  No adenopathy. Reproductive: Normal. Other: No free air or free fluid. Musculoskeletal: Severe atrophy of paraspinal muscles, pelvic muscles, and buttock and proximal thigh muscles. Chronic degenerative disc and joint disease in the lumbar spine. Intra medullary nail and compression screw in the proximal left femur. IMPRESSION: 1. New air in the gallbladder with chronic thickening  of the gallbladder wall. This could represent emphysematous cholecystitis. 2. No other significant change since the prior exam. 3. Severe atrophy of the muscles of the pelvis, paraspinal muscles and proximal thigh muscles. Electronically Signed   By: Francene BoyersJames  Maxwell M.D.   On: 10/16/2015 20:00    Labs:  CBC:  Recent Labs  07/20/15 0434 07/21/15 0424 10/16/15 1808 10/17/15 0222  WBC 5.4 5.1 12.9* 13.4*  HGB 11.9* 12.0* 13.9 12.9*  HCT 36.1* 37.1* 41.8 38.1*  PLT 216 211 382 313    COAGS:  Recent Labs  10/17/15 1233  INR 1.19    BMP:  Recent Labs  07/20/15 0434 07/21/15 0424 10/16/15 1808 10/17/15 0222  NA 144 142 137 138  K 4.0 3.8 3.8 3.5  CL 110 108 103 107  CO2 27 26 24 23   GLUCOSE 112* 128* 126* 154*  BUN 8 6 12 9   CALCIUM  8.6* 8.5* 8.9 8.4*  CREATININE 0.33* <0.30* 0.32* <0.30*  GFRNONAA >60 NOT CALCULATED >60 NOT CALCULATED  GFRAA >60 NOT CALCULATED >60 NOT CALCULATED    LIVER FUNCTION TESTS:  Recent Labs  07/20/15 0434 07/21/15 0424 10/16/15 1808 10/17/15 0222  BILITOT 0.4 0.6 1.2 0.7  AST 141* 65* 30 136*  ALT 123* 87* 27 85*  ALKPHOS 127* 116 116 136*  PROT 6.3* 6.4* 8.3* 7.7  ALBUMIN 3.2* 3.0* 4.2 3.8    TUMOR MARKERS: No results for input(s): AFPTM, CEA, CA199, CHROMGRNA in the last 8760 hours.  Assessment and Plan: Patient with history of acalculous cholecystitis and advanced ALS. Previously treated with antibiotic therapy. Recently readmitted with abdominal pain, leukocytosis . Positive HIDA scan in March 2017. Patient poor surgical candidate due to ALS.  Request now received for percutaneous cholecystostomy. Imaging studies were reviewed by Dr. Archer AsaMcCullough. Details/risks of cholecystostomy, included but not limited to, internal bleeding, infection, injury to adjacent organs discussed with patient via an interpreter with his understanding and consent. Procedure tentatively scheduled for later today or tomorrow.  Thank you for this interesting consult.  I greatly enjoyed meeting [id number] and look forward to participating in their care.  A copy of this report was sent to the requesting provider on this  date.  Electronically Signed: D. Jeananne Rama 10/17/2015, 2:47 PM   Approximate 40 minutes were spent face to face in clinical consultation, greater than 50% of which was counseling/coordinating care for percutaneous cholecystostomy

## 2015-10-18 DIAGNOSIS — K810 Acute cholecystitis: Principal | ICD-10-CM | POA: Diagnosis present

## 2015-10-18 DIAGNOSIS — E876 Hypokalemia: Secondary | ICD-10-CM | POA: Diagnosis present

## 2015-10-18 LAB — CBC
HCT: 33.2 % — ABNORMAL LOW (ref 39.0–52.0)
Hemoglobin: 11.1 g/dL — ABNORMAL LOW (ref 13.0–17.0)
MCH: 28.9 pg (ref 26.0–34.0)
MCHC: 33.4 g/dL (ref 30.0–36.0)
MCV: 86.5 fL (ref 78.0–100.0)
PLATELETS: 175 10*3/uL (ref 150–400)
RBC: 3.84 MIL/uL — AB (ref 4.22–5.81)
RDW: 14.5 % (ref 11.5–15.5)
WBC: 9.6 10*3/uL (ref 4.0–10.5)

## 2015-10-18 LAB — COMPREHENSIVE METABOLIC PANEL
ALT: 53 U/L (ref 17–63)
AST: 42 U/L — AB (ref 15–41)
Albumin: 2.5 g/dL — ABNORMAL LOW (ref 3.5–5.0)
Alkaline Phosphatase: 104 U/L (ref 38–126)
Anion gap: 5 (ref 5–15)
BUN: 11 mg/dL (ref 6–20)
CALCIUM: 7.6 mg/dL — AB (ref 8.9–10.3)
CHLORIDE: 111 mmol/L (ref 101–111)
CO2: 22 mmol/L (ref 22–32)
Glucose, Bld: 112 mg/dL — ABNORMAL HIGH (ref 65–99)
POTASSIUM: 2.6 mmol/L — AB (ref 3.5–5.1)
SODIUM: 138 mmol/L (ref 135–145)
TOTAL PROTEIN: 5.5 g/dL — AB (ref 6.5–8.1)
Total Bilirubin: 1.1 mg/dL (ref 0.3–1.2)

## 2015-10-18 LAB — MAGNESIUM: MAGNESIUM: 1.7 mg/dL (ref 1.7–2.4)

## 2015-10-18 MED ORDER — MAGNESIUM SULFATE 2 GM/50ML IV SOLN
2.0000 g | Freq: Once | INTRAVENOUS | Status: AC
  Administered 2015-10-18: 2 g via INTRAVENOUS
  Filled 2015-10-18: qty 50

## 2015-10-18 MED ORDER — ROSUVASTATIN CALCIUM 20 MG PO TABS: 20.0000 mg | ORAL_TABLET | Freq: Every day | ORAL | Status: DC

## 2015-10-18 MED ORDER — POTASSIUM CHLORIDE CRYS ER 20 MEQ PO TBCR
40.0000 meq | EXTENDED_RELEASE_TABLET | ORAL | Status: AC
  Administered 2015-10-18 (×2): 40 meq via ORAL
  Filled 2015-10-18 (×2): qty 2

## 2015-10-18 NOTE — Progress Notes (Signed)
CRITICAL VALUE ALERT  Critical value received:  K+ 2.6    Date of notification:  10/18/2015   Time of notification:  0930  Critical value read back: yes  Nurse who received alert:  Nicoletta BaAbby Antwan Bribiesca, RN  MD notified (1st page): Dr. Gonzella Lexhungel  Time of first page:  0932  MD notified (2nd page):  Time of second page:  Responding MD:   Time MD responded:

## 2015-10-18 NOTE — Progress Notes (Addendum)
PROGRESS NOTE                                                                                                                                                                                                             Patient Demographics:    Thomas Raymond, is a 70 y.o. male, DOB - Jan 30, 1946, ZOX:[id number]  Admit date - 10/16/2015   Admitting Physician Therisa Doyne, MD  Outpatient Primary MD for the patient is Oliver Barre, MD  LOS - 2  Outpatient Specialists: None  Chief Complaint  Patient presents with  . Abdominal Pain       Brief Narrative  70 year old male with history of ALS, hypertension, acalculus cholecystitis presented with abdominal pain. He was admitted with similar symptoms in March 2017, was manages supportive care and antibiotics given chronic history of ALS. CT abdomen showed emphysematous cholecystitis and patient underwent percutaneous cholecystostomy drain placement by IR.   Subjective:   Patient reports minimal right upper quadrant pain. No nausea or vomiting.   Assessment  & Plan :   Principal problem Emphysematous acalculus cholecystitis  status post cholecystostomy tube placement by IR on 6/27. Symptoms improved. Continue empiric antibiotics. Tolerating clears, diet advanced to full liquid today. Surgery and IR following. Recommend to continue drinking for at least 4-6 weeks.  Hypokalemia/hypomagnesemia Replenish  ALS Continue home medications  Essential hypertension Stable. Continue home medications     Code Status : Full code  Family Communication  : None at bedside  Disposition Plan  : Home possibly in 1-2 days once tolerating diet  Barriers For Discharge :   Consults  :   [facility] IR  Procedures  : CT-guided percutaneous cholecystostomy drain placement   DVT Prophylaxis  :  Lovenox -   Lab Results  Component Value Date   PLT 175 10/18/2015    Antibiotics  :     Anti-infectives    Start     Dose/Rate Route Frequency Ordered Stop   10/17/15 0600  piperacillin-tazobactam (ZOSYN) IVPB 3.375 g     3.375 g 12.5 mL/hr over 240 Minutes Intravenous Every 8 hours 10/16/15 2335     10/16/15 2115  piperacillin-tazobactam (ZOSYN) IVPB 3.375 g     3.375 g 12.5 mL/hr over 240 Minutes Intravenous  Once 10/16/15 2103 10/16/15 2128        Objective:  Filed Vitals:   10/17/15 1824 10/17/15 2033 10/18/15 0449 10/18/15 1500  BP: 157/71 119/58 122/56 114/52  Pulse: 95 87 76 65  Temp: 99 F (37.2 C) 99.9 F (37.7 C) 99.8 F (37.7 C) 97.6 F (36.4 C)  TempSrc: Oral Oral Oral Oral  Resp: Height:      Weight:      SpO2:  90% 94% 94%    Wt Readings from Last 3 Encounters:  10/17/15 68.493 kg (151 lb)  08/24/15 68.04 kg (150 lb)  07/19/15 72.576 kg (160 lb)     Intake/Output Summary (Last 24 hours) at 10/18/15 1557 Last data filed at 10/18/15 1610  Gross per 24 hour  Intake 3085.42 ml  Output    100 ml  Net 2985.42 ml     Physical Exam  Gen: not in distress HEENT:  moist mucosa, supple neck Chest: clear b/l, no added sounds CVS: N S1&S2, no murmurs, rubs or gallop GI: soft,  ND, BS+, Cholecystostomy drain in place, minimal tenderness Musculoskeletal: warm, no edema CNS: Alert and oriented    Data Review:    CBC  Recent Labs Lab 10/16/15 1808 10/17/15 0222 10/18/15 0837  WBC 12.9* 13.4* 9.6  HGB 13.9 12.9* 11.1*  HCT 41.8 38.1* 33.2*  PLT 382 313 175  MCV 87.6 85.8 86.5  MCH 29.1 29.1 28.9  MCHC 33.3 33.9 33.4  RDW 13.8 13.7 14.5  LYMPHSABS 2.4  --   --   MONOABS 0.7  --   --   EOSABS 0.1  --   --   BASOSABS 0.0  --   --     Chemistries   Recent Labs Lab 10/16/15 1808 10/17/15 0222 10/18/15 0837  NA 137 138 138  K 3.8 3.5 2.6*  CL 103 107 111  CO2 GLUCOSE 126* 154* 112*  BUN CREATININE 0.32* <0.30* <0.30*  CALCIUM 8.9 8.4* 7.6*  MG  --  1.9 1.7  AST 30 136* 42*  ALT 27  85* 53  ALKPHOS 116 136* 104  BILITOT 1.2 0.7 1.1   ------------------------------------------------------------------------------------------------------------------ No results for input(s): CHOL, HDL, LDLCALC, TRIG, CHOLHDL, LDLDIRECT in the last 72 hours.  Lab Results  Component Value Date   HGBA1C 6.2 02/23/2015   ------------------------------------------------------------------------------------------------------------------  Recent Labs  10/17/15 0222  TSH 0.629   ------------------------------------------------------------------------------------------------------------------ No results for input(s): VITAMINB12, FOLATE, FERRITIN, TIBC, IRON, RETICCTPCT in the last 72 hours.  Coagulation profile  Recent Labs Lab 10/17/15 1233  INR 1.19    No results for input(s): DDIMER in the last 72 hours.  Cardiac Enzymes No results for input(s): CKMB, TROPONINI, MYOGLOBIN in the last 168 hours.  Invalid input(s): CK ------------------------------------------------------------------------------------------------------------------ No results found for: BNP  Inpatient Medications  Scheduled Meds: . atenolol  50 mg Oral QHS  . piperacillin-tazobactam (ZOSYN)  IV  3.375 g Intravenous Q8H  . pregabalin  75 mg Oral BID  . rosuvastatin  20 mg Oral Daily   Continuous Infusions: . sodium chloride 125 mL/hr at 10/18/15 1023   PRN Meds:.acetaminophen **OR** acetaminophen, hydrALAZINE, HYDROmorphone (DILAUDID) injection, midazolam, ondansetron **OR** ondansetron (ZOFRAN) IV  Micro Results Recent Results (from the past 240 hour(s))  Surgical PCR screen     Status: None   Collection Time: 10/16/15 11:58 PM  Result Value Ref Range Status   MRSA, PCR NEGATIVE NEGATIVE Final   Staphylococcus aureus NEGATIVE NEGATIVE Final    Comment:  The Xpert SA Assay (FDA approved for NASAL specimens in patients over 59 years of age), is one component of a comprehensive  surveillance program.  Test performance has been validated by [facility] for patients greater than or equal to 12 year old. It is not intended to diagnose infection nor to guide or monitor treatment.   Aerobic/Anaerobic Culture (surgical/deep wound)     Status: None (Preliminary result)   Collection Time: 10/17/15  6:16 PM  Result Value Ref Range Status   Specimen Description BILE  Final   Special Requests Normal  Final   Gram Stain   Final    ABUNDANT WBC PRESENT, PREDOMINANTLY PMN ABUNDANT GRAM VARIABLE ROD    Culture   Final    NO GROWTH < 24 HOURS Performed at [facility]    Report Status PENDING  Incomplete    Radiology Reports Dg Shoulder Right  09/30/2015  CLINICAL DATA:  70 year old male status post fall with pain in the right shoulder EXAM: RIGHT SHOULDER - 2+ VIEW COMPARISON:  None. FINDINGS: There is an acute displaced fracture through the anatomic neck of the humerus. There is surrounding soft tissue swelling and likely hematoma. The humeral head remains located with respect to the glenoid. The visualized thorax is unremarkable. IMPRESSION: Acute displaced fracture through the anatomic neck of the humerus. Electronically Signed   By: Malachy Moan M.D.   On: 09/30/2015 13:05   US Abdomen Complete  10/17/2015  CLINICAL DATA:  Cholecystitis EXAM: ABDOMEN ULTRASOUND COMPLETE COMPARISON:  CT scan 10/16/2015 FINDINGS: Gallbladder: There is limited visualization and assessment of the gallbladder due to air artifact from intraluminal air as noted on yesterday CT scan. Acute cholecystitis cannot be excluded. Clinic calculation is necessary. Further correlation with biliary scan could be performed as clinically warranted. Common bile duct: Diameter: 2 mm in diameter within normal limits. Liver: No definite focal hepatic mass. IVC: No abnormality visualized. Pancreas: Visualized portion unremarkable. Spleen: Size and appearance within normal limits.  Measures 5.7 cm. Right  Kidney: Length: 9.8 cm. There is normal echogenicity. A cyst is noted in lower pole measures 1.8 x 1.3 cm. Second cyst in lower pole measures 1.1 cm Left Kidney: Length: 11.1 cm. Echogenicity within normal limits. No mass or hydronephrosis visualized. There is a midpole cyst measures 1 x 0.8 cm. Abdominal aorta: No aneurysm visualized. Measures up to 2.8 cm in diameter. Other findings: None. IMPRESSION: 1. Significant limited visualization and assessment of the gallbladder due to intraluminal air as noted on yesterday CT scan. Cholecystitis cannot be excluded. Clinical correlation is necessary. Further evaluation with biliary scan could be performed as clinically warranted. 2. Normal CBD. 3. No hydronephrosis.  Bilateral renal cysts. Electronically Signed   By: Natasha Mead M.D.   On: 10/17/2015 16:36   Ct Abdomen Pelvis W Contrast  10/16/2015  CLINICAL DATA:  Left lower quadrant pain after eating lunch today. EXAM: CT ABDOMEN AND PELVIS WITH CONTRAST TECHNIQUE: Multidetector CT imaging of the abdomen and pelvis was performed using the standard protocol following bolus administration of intravenous contrast. CONTRAST:  ISOVUE-300 IOPAMIDOL (ISOVUE-300) INJECTION 61% COMPARISON:  CT scan dated 07/18/2013 FINDINGS: Lower chest:  No acute findings. Hepatobiliary: There is air in the gallbladder. No air in the bile ducts. There is slight chronic prominence of the intra and extrahepatic bile ducts which is stable. Slight chronic thickening of the gallbladder wall. No air within the wall of the gallbladder. No liver masses. Pancreas: No mass, inflammatory changes, or other significant  abnormality. Spleen: Within normal limits in size and appearance. Adrenals/Urinary Tract: Normal adrenal glands. Three small cysts in the right kidney, unchanged, the largest being 15 mm in the lower pole. 2 tiny cysts in the left kidney. No hydronephrosis. Bladder is normal. Stomach/Bowel: The bowel appears normal including the  terminal ileum and appendix. No diverticular disease. Vascular/Lymphatic: Aortic atherosclerosis.  No adenopathy. Reproductive: Normal. Other: No free air or free fluid. Musculoskeletal: Severe atrophy of paraspinal muscles, pelvic muscles, and buttock and proximal thigh muscles. Chronic degenerative disc and joint disease in the lumbar spine. Intra medullary nail and compression screw in the proximal left femur. IMPRESSION: 1. New air in the gallbladder with chronic thickening of the gallbladder wall. This could represent emphysematous cholecystitis. 2. No other significant change since the prior exam. 3. Severe atrophy of the muscles of the pelvis, paraspinal muscles and proximal thigh muscles. Electronically Signed   By: Francene BoyersJames  Maxwell M.D.   On: 10/16/2015 20:00   Ct Image Guided Drainage By Percutaneous Catheter  10/18/2015  INDICATION: 70 year old male with a history of amyotrophic lateral sclerosis and recurrent acalculous cholecystitis. He is an extremely poor surgical candidate given his diagnosis of ALS. He presents for percutaneous cholecystostomy tube placement. Unfortunately, this could not be performed in the usual fashion with ultrasound guidance given evolution to frank emphysematous cholecystitis which obscures the gallbladder. Therefore, cholecystostomy tube placement will be performed with CT guidance. EXAM: CT GUIDED CHOLECYSTOSTOMY TUBE PLACEMENT MEDICATIONS: Patient is on intravenous Zosyn 3.375 g q.8 hours. No additional antibiotic prophylaxis was given. ANESTHESIA/SEDATION: Moderate (conscious) sedation was employed during this procedure. A total of Versed 2 mg and Fentanyl 100 mcg was administered intravenously. Moderate Sedation Time: 13 minutes. The patient's level of consciousness and vital signs were monitored continuously by radiology nursing throughout the procedure under my direct supervision. FLUOROSCOPY TIME:  None. COMPLICATIONS: None immediate. Estimated blood loss:  0  PROCEDURE: Informed written consent was obtained from the patient after a thorough discussion of the procedural risks, benefits and alternatives. All questions were addressed. A timeout was performed prior to the initiation of the procedure. A planning axial CT scan was performed. There is persistent colonic interposition. The: Drapes over the gallbladder. There is been evolution to fall minute emphysematous cholecystitis with a significant increase in gallbladder wall thickening and stranding in the pericholecystic fat. A suitable transhepatic window that would avoid the colon was selected. The skin was marked. The region was then sterilely prepped and draped in standard fashion using chlorhexidine skin prep. Local anesthesia was attained by infiltration with 1% lidocaine. A small dermatotomy was made. Under intermittent CT guidance, an 18 gauge trocar needle was advanced along a short transhepatic course into the gallbladder lumen. A 0.035 Amplatz wire was then coiled within the gallbladder lumen in the tract dilated to 10 [place]. A Cook 10 [place] all-purpose drainage catheter was then advanced over the wire and positioned in the gallbladder lumen. There was immediate return of bloody bile. A sample was collected and sent to the lab for culture and sensitivities. The drainage catheter was then secured in place with 0 Prolene suture and an adhesive fixation device. The catheter was connected to gravity bag drainage. The patient was returned to the floor in stable condition. IMPRESSION: 1. Interval evolution to frank emphysematous cholecystitis. 2. Successful placement of a 10 French transhepatic percutaneous cholecystostomy tube using CT guidance. 3. Bile cultures are pending. These results were called by telephone at the time of interpretation on 10/18/2015 at 8:31 am  to Dr. Sherrie GeorgeWILLARD JENNINGS , who verbally acknowledged these results. Signed, Sterling BigHeath K. McCullough, MD [facility]  [facility] Electronically Signed   By: Malachy MoanHeath  McCullough M.D.   On: 10/18/2015 08:32    Time Spent in minutes  25   Eddie NorthHUNGEL, Zurii Hewes M.D on 10/18/2015 at 3:57 PM  Between 7am to 7pm - Pager - [phone]  After 7pm go to [email or web address] - password [email or web address]  [facility] -  Office  [phone]

## 2015-10-18 NOTE — Progress Notes (Signed)
Patient ID: Thomas Raymond, male   DOB: 09/15/1945, 70 y.o.   MRN: [id number]    Referring Physician(s): Thomas,A  Supervising Physician: Simonne ComeWatts, [name]  Patient Status:  Inpatient  Chief Complaint:  cholecystitis  Subjective:  Pt doing ok today; has had 1 loose stool this am; some soreness at RUQ drain site but not severe ; denies N/V; asking when he can go home   Allergies: Review of patient's allergies indicates no known allergies.  Medications: Prior to Admission medications   Medication Sig Start Date End Date Taking? Authorizing Provider  acetaminophen (TYLENOL) 325 MG tablet Take 2 tablets (650 mg total) by mouth every 6 (six) hours as needed for mild pain (or temp > 100). 07/21/15  Yes Emina Riebock, NP  amLODipine-benazepril (LOTREL) 10-40 MG capsule TAKE ONE CAPSULE BY MOUTH EVERY DAY 07/25/15  Yes Corwin LevinsJames W [name], MD  atenolol (TENORMIN) 50 MG tablet Take 1 tablet (50 mg total) by mouth at bedtime. 02/23/15  Yes Corwin LevinsJames W [name], MD  cetirizine (ZYRTEC) 10 MG tablet Take 1 tablet (10 mg total) by mouth daily. 02/23/15  Yes Corwin LevinsJames W [name], MD  HYDROcodone-acetaminophen (NORCO/VICODIN) 5-325 MG tablet Take 1-2 tablets by mouth every 4 (four) hours as needed. Patient taking differently: Take 1-2 tablets by mouth every 4 (four) hours as needed for moderate pain or severe pain.  09/30/15  Yes Linwood DibblesJon Knapp, MD  omeprazole (PRILOSEC) 20 MG capsule TAKE ONE CAPSULE BY MOUTH EVERY DAY Patient taking differently: TAKE 20 MG BY MOUTH EVERY DAY 09/25/15  Yes Corwin LevinsJames W [name], MD  pregabalin (LYRICA) 75 MG capsule Take 1 capsule (75 mg total) by mouth 2 (two) times daily. 08/24/15  Yes Corwin LevinsJames W [name], MD  rosuvastatin (CRESTOR) 20 MG tablet Take 1 tablet (20 mg total) by mouth daily. 09/26/14  Yes Corwin LevinsJames W [name], MD     Vital Signs: BP 122/56 mmHg  Pulse 76  Temp(Src) 99.8 F (37.7 C) (Oral)  Resp 20  Ht 5\' 5"  (1.651 m)  Wt 151 lb (68.493 kg)  BMI 25.13 kg/m2  SpO2 94%  Physical Exam RUQ/GB drain intact,  insertion site ok, mildly tender; output 100 cc blood-tinged bile; cx's pend; drain irrigated with sterile NS  Imaging: Koreas Abdomen Complete  10/17/2015  CLINICAL DATA:  Cholecystitis EXAM: ABDOMEN ULTRASOUND COMPLETE COMPARISON:  CT scan 10/16/2015 FINDINGS: Gallbladder: There is limited visualization and assessment of the gallbladder due to air artifact from intraluminal air as noted on yesterday CT scan. Acute cholecystitis cannot be excluded. Clinic calculation is necessary. Further correlation with biliary scan could be performed as clinically warranted. Common bile duct: Diameter: 2 mm in diameter within normal limits. Liver: No definite focal hepatic mass. IVC: No abnormality visualized. Pancreas: Visualized portion unremarkable. Spleen: Size and appearance within normal limits.  Measures 5.7 cm. Right Kidney: Length: 9.8 cm. There is normal echogenicity. A cyst is noted in lower pole measures 1.8 x 1.3 cm. Second cyst in lower pole measures 1.1 cm Left Kidney: Length: 11.1 cm. Echogenicity within normal limits. No mass or hydronephrosis visualized. There is a midpole cyst measures 1 x 0.8 cm. Abdominal aorta: No aneurysm visualized. Measures up to 2.8 cm in diameter. Other findings: None. IMPRESSION: 1. Significant limited visualization and assessment of the gallbladder due to intraluminal air as noted on yesterday CT scan. Cholecystitis cannot be excluded. Clinical correlation is necessary. Further evaluation with biliary scan could be performed as clinically warranted. 2. Normal CBD. 3. No hydronephrosis.  Bilateral renal cysts. Electronically Signed  By: Natasha MeadLiviu  Pop M.D.   On: 10/17/2015 16:36   Ct Abdomen Pelvis W Contrast  10/16/2015  CLINICAL DATA:  Left lower quadrant pain after eating lunch today. EXAM: CT ABDOMEN AND PELVIS WITH CONTRAST TECHNIQUE: Multidetector CT imaging of the abdomen and pelvis was performed using the standard protocol following bolus administration of intravenous  contrast. CONTRAST:  100mL ISOVUE-300 IOPAMIDOL (ISOVUE-300) INJECTION 61% COMPARISON:  CT scan dated 07/18/2013 FINDINGS: Lower chest:  No acute findings. Hepatobiliary: There is air in the gallbladder. No air in the bile ducts. There is slight chronic prominence of the intra and extrahepatic bile ducts which is stable. Slight chronic thickening of the gallbladder wall. No air within the wall of the gallbladder. No liver masses. Pancreas: No mass, inflammatory changes, or other significant abnormality. Spleen: Within normal limits in size and appearance. Adrenals/Urinary Tract: Normal adrenal glands. Three small cysts in the right kidney, unchanged, the largest being 15 mm in the lower pole. 2 tiny cysts in the left kidney. No hydronephrosis. Bladder is normal. Stomach/Bowel: The bowel appears normal including the terminal ileum and appendix. No diverticular disease. Vascular/Lymphatic: Aortic atherosclerosis.  No adenopathy. Reproductive: Normal. Other: No free air or free fluid. Musculoskeletal: Severe atrophy of paraspinal muscles, pelvic muscles, and buttock and proximal thigh muscles. Chronic degenerative disc and joint disease in the lumbar spine. Intra medullary nail and compression screw in the proximal left femur. IMPRESSION: 1. New air in the gallbladder with chronic thickening of the gallbladder wall. This could represent emphysematous cholecystitis. 2. No other significant change since the prior exam. 3. Severe atrophy of the muscles of the pelvis, paraspinal muscles and proximal thigh muscles. Electronically Signed   By: Francene BoyersJames  Maxwell M.D.   On: 10/16/2015 20:00   Ct Image Guided Drainage By Percutaneous Catheter  10/18/2015  INDICATION: 70 year old male with a history of amyotrophic lateral sclerosis and recurrent acalculous cholecystitis. He is an extremely poor surgical candidate given his diagnosis of ALS. He presents for percutaneous cholecystostomy tube placement. Unfortunately, this could  not be performed in the usual fashion with ultrasound guidance given evolution to frank emphysematous cholecystitis which obscures the gallbladder. Therefore, cholecystostomy tube placement will be performed with CT guidance. EXAM: CT GUIDED CHOLECYSTOSTOMY TUBE PLACEMENT MEDICATIONS: Patient is on intravenous Zosyn 3.375 g q.8 hours. No additional antibiotic prophylaxis was given. ANESTHESIA/SEDATION: Moderate (conscious) sedation was employed during this procedure. A total of Versed 2 mg and Fentanyl 100 mcg was administered intravenously. Moderate Sedation Time: 13 minutes. The patient's level of consciousness and vital signs were monitored continuously by radiology nursing throughout the procedure under my direct supervision. FLUOROSCOPY TIME:  None. COMPLICATIONS: None immediate. Estimated blood loss:  0 PROCEDURE: Informed written consent was obtained from the patient after a thorough discussion of the procedural risks, benefits and alternatives. All questions were addressed. A timeout was performed prior to the initiation of the procedure. A planning axial CT scan was performed. There is persistent colonic interposition. The: Drapes over the gallbladder. There is been evolution to fall minute emphysematous cholecystitis with a significant increase in gallbladder wall thickening and stranding in the pericholecystic fat. A suitable transhepatic window that would avoid the colon was selected. The skin was marked. The region was then sterilely prepped and draped in standard fashion using chlorhexidine skin prep. Local anesthesia was attained by infiltration with 1% lidocaine. A small dermatotomy was made. Under intermittent CT guidance, an 18 gauge trocar needle was advanced along a short transhepatic course into the gallbladder lumen. A  0.035 Amplatz wire was then coiled within the gallbladder lumen in the tract dilated to 10 [place]. A Cook 10 [place] all-purpose drainage catheter was then advanced over the wire  and positioned in the gallbladder lumen. There was immediate return of bloody bile. A sample was collected and sent to the lab for culture and sensitivities. The drainage catheter was then secured in place with 0 Prolene suture and an adhesive fixation device. The catheter was connected to gravity bag drainage. The patient was returned to the floor in stable condition. IMPRESSION: 1. Interval evolution to frank emphysematous cholecystitis. 2. Successful placement of a 10 French transhepatic percutaneous cholecystostomy tube using CT guidance. 3. Bile cultures are pending. These results were called by telephone at the time of interpretation on 10/18/2015 at 8:31 am to Dr. Sherrie George , who verbally acknowledged these results. Signed, Sterling Big, MD [facility] Electronically Signed   By: Malachy Moan M.D.   On: 10/18/2015 08:32    Labs:  CBC:  Recent Labs  07/21/15 0424 10/16/15 1808 10/17/15 0222 10/18/15 0837  WBC 5.1 12.9* 13.4* 9.6  HGB 12.0* 13.9 12.9* 11.1*  HCT 37.1* 41.8 38.1* 33.2*  PLT 211 382 313 175    COAGS:  Recent Labs  10/17/15 1233  INR 1.19    BMP:  Recent Labs  07/21/15 0424 10/16/15 1808 10/17/15 0222 10/18/15 0837  NA 142 137 138 138  K 3.8 3.8 3.5 2.6*  CL 108 103 107 111  CO2 GLUCOSE 128* 126* 154* 112*  BUN CALCIUM 8.5* 8.9 8.4* 7.6*  CREATININE <0.30* 0.32* <0.30* <0.30*  GFRNONAA NOT CALCULATED >60 NOT CALCULATED NOT CALCULATED  GFRAA NOT CALCULATED >60 NOT CALCULATED NOT CALCULATED    LIVER FUNCTION TESTS:  Recent Labs  07/21/15 0424 10/16/15 1808 10/17/15 0222 10/18/15 0837  BILITOT 0.6 1.2 0.7 1.1  AST 65* 30 136* 42*  ALT 87* 27 85* 53  ALKPHOS 116 116 136* 104  PROT 6.4* 8.3* 7.7 5.5*  ALBUMIN 3.0* 4.2 3.8 2.5*    Assessment and Plan: Pt with hx emphysematous cholecystitis, s/p perc cholecystostomy 6/27; temp 99.8; WBC 9.6,  HGB 11.1, K 2.6- replace; t bili 1.1, creat .30; bile cx's pend; cont drain for at least 4-6 weeks unless cholecystectomy done in interim; cont drain irrigation; other plans as per CCS/TRH   Electronically Signed: D. Jeananne Rama 10/18/2015, 9:49 AM   I spent a total of 15 minutes at the the patient's bedside AND on the patient's hospital floor or unit, greater than 50% of which was counseling/coordinating care for percutaneous cholecystostomy

## 2015-10-18 NOTE — Progress Notes (Signed)
Subjective: He looks good, and with his limited [place] notes he feels much better.  Still some tenderness right side, but overall looks good.  Wants to know when he can go home.  Objective: Vital signs in last 24 hours: Temp:  [99 F (37.2 C)-99.9 F (37.7 C)] 99.8 F (37.7 C) (06/28 0449) Pulse Rate:  [68-95] 76 (06/28 0449) Resp:  [18-24] 20 (06/28 0449) BP: (119-157)/(55-77) 122/56 mmHg (06/28 0449) SpO2:  [90 %-98 %] 94 % (06/28 0449) Last BM Date: 10/16/15 NPO IV 2935 Other ? -> 100 Afebrile, TM 99.9 last PM, VSS Labs pending but WBC down to 9.6 Intake/Output from previous day: 06/27 0701 - 06/28 0700 In: 3085.4 [I.V.:2935.4; IV Piggyback:150] Out: 100  Intake/Output this shift:    General appearance: alert, cooperative and no distress Resp: clear to auscultation bilaterally GI: soft, tender right side, but better than yesterday.  + BS  Bile drainage is not much and is brown bloody looking.    Lab Results:   Recent Labs  10/17/15 0222 10/18/15 0837  WBC 13.4* 9.6  HGB 12.9* 11.1*  HCT 38.1* 33.2*  PLT 313 175    BMET  Recent Labs  10/16/15 1808 10/17/15 0222  NA 137 138  K 3.8 3.5  CL 103 107  CO2 24 23  GLUCOSE 126* 154*  BUN 12 9  CREATININE 0.32* <0.30*  CALCIUM 8.9 8.4*   PT/INR  Recent Labs  10/17/15 1233  LABPROT 14.8  INR 1.19     Recent Labs Lab 10/16/15 1808 10/17/15 0222  AST 30 136*  ALT 27 85*  ALKPHOS 116 136*  BILITOT 1.2 0.7  PROT 8.3* 7.7  ALBUMIN 4.2 3.8     Lipase     Component Value Date/Time   LIPASE 27 10/16/2015 1808     Studies/Results: Koreas Abdomen Complete  10/17/2015  CLINICAL DATA:  Cholecystitis EXAM: ABDOMEN ULTRASOUND COMPLETE COMPARISON:  CT scan 10/16/2015 FINDINGS: Gallbladder: There is limited visualization and assessment of the gallbladder due to air artifact from intraluminal air as noted on yesterday CT scan. Acute cholecystitis cannot be excluded. Clinic calculation is necessary.  Further correlation with biliary scan could be performed as clinically warranted. Common bile duct: Diameter: 2 mm in diameter within normal limits. Liver: No definite focal hepatic mass. IVC: No abnormality visualized. Pancreas: Visualized portion unremarkable. Spleen: Size and appearance within normal limits.  Measures 5.7 cm. Right Kidney: Length: 9.8 cm. There is normal echogenicity. A cyst is noted in lower pole measures 1.8 x 1.3 cm. Second cyst in lower pole measures 1.1 cm Left Kidney: Length: 11.1 cm. Echogenicity within normal limits. No mass or hydronephrosis visualized. There is a midpole cyst measures 1 x 0.8 cm. Abdominal aorta: No aneurysm visualized. Measures up to 2.8 cm in diameter. Other findings: None. IMPRESSION: 1. Significant limited visualization and assessment of the gallbladder due to intraluminal air as noted on yesterday CT scan. Cholecystitis cannot be excluded. Clinical correlation is necessary. Further evaluation with biliary scan could be performed as clinically warranted. 2. Normal CBD. 3. No hydronephrosis.  Bilateral renal cysts. Electronically Signed   By: Natasha MeadLiviu  Pop M.D.   On: 10/17/2015 16:36   Ct Abdomen Pelvis W Contrast  10/16/2015  CLINICAL DATA:  Left lower quadrant pain after eating lunch today. EXAM: CT ABDOMEN AND PELVIS WITH CONTRAST TECHNIQUE: Multidetector CT imaging of the abdomen and pelvis was performed using the standard protocol following bolus administration of intravenous contrast. CONTRAST:  100mL ISOVUE-300 IOPAMIDOL (ISOVUE-300) INJECTION  61% COMPARISON:  CT scan dated 07/18/2013 FINDINGS: Lower chest:  No acute findings. Hepatobiliary: There is air in the gallbladder. No air in the bile ducts. There is slight chronic prominence of the intra and extrahepatic bile ducts which is stable. Slight chronic thickening of the gallbladder wall. No air within the wall of the gallbladder. No liver masses. Pancreas: No mass, inflammatory changes, or other significant  abnormality. Spleen: Within normal limits in size and appearance. Adrenals/Urinary Tract: Normal adrenal glands. Three small cysts in the right kidney, unchanged, the largest being 15 mm in the lower pole. 2 tiny cysts in the left kidney. No hydronephrosis. Bladder is normal. Stomach/Bowel: The bowel appears normal including the terminal ileum and appendix. No diverticular disease. Vascular/Lymphatic: Aortic atherosclerosis.  No adenopathy. Reproductive: Normal. Other: No free air or free fluid. Musculoskeletal: Severe atrophy of paraspinal muscles, pelvic muscles, and buttock and proximal thigh muscles. Chronic degenerative disc and joint disease in the lumbar spine. Intra medullary nail and compression screw in the proximal left femur. IMPRESSION: 1. New air in the gallbladder with chronic thickening of the gallbladder wall. This could represent emphysematous cholecystitis. 2. No other significant change since the prior exam. 3. Severe atrophy of the muscles of the pelvis, paraspinal muscles and proximal thigh muscles. Electronically Signed   By: Francene Boyers M.D.   On: 10/16/2015 20:00   Ct Image Guided Drainage By Percutaneous Catheter  10/18/2015  INDICATION: 70 year old male with a history of amyotrophic lateral sclerosis and recurrent acalculous cholecystitis. He is an extremely poor surgical candidate given his diagnosis of ALS. He presents for percutaneous cholecystostomy tube placement. Unfortunately, this could not be performed in the usual fashion with ultrasound guidance given evolution to frank emphysematous cholecystitis which obscures the gallbladder. Therefore, cholecystostomy tube placement will be performed with CT guidance. EXAM: CT GUIDED CHOLECYSTOSTOMY TUBE PLACEMENT MEDICATIONS: Patient is on intravenous Zosyn 3.375 g q.8 hours. No additional antibiotic prophylaxis was given. ANESTHESIA/SEDATION: Moderate (conscious) sedation was employed during this procedure. A total of Versed 2 mg and  Fentanyl 100 mcg was administered intravenously. Moderate Sedation Time: 13 minutes. The patient's level of consciousness and vital signs were monitored continuously by radiology nursing throughout the procedure under my direct supervision. FLUOROSCOPY TIME:  None. COMPLICATIONS: None immediate. Estimated blood loss:  0 PROCEDURE: Informed written consent was obtained from the patient after a thorough discussion of the procedural risks, benefits and alternatives. All questions were addressed. A timeout was performed prior to the initiation of the procedure. A planning axial CT scan was performed. There is persistent colonic interposition. The: Drapes over the gallbladder. There is been evolution to fall minute emphysematous cholecystitis with a significant increase in gallbladder wall thickening and stranding in the pericholecystic fat. A suitable transhepatic window that would avoid the colon was selected. The skin was marked. The region was then sterilely prepped and draped in standard fashion using chlorhexidine skin prep. Local anesthesia was attained by infiltration with 1% lidocaine. A small dermatotomy was made. Under intermittent CT guidance, an 18 gauge trocar needle was advanced along a short transhepatic course into the gallbladder lumen. A 0.035 Amplatz wire was then coiled within the gallbladder lumen in the tract dilated to 10 [place]. A Cook 10 [place] all-purpose drainage catheter was then advanced over the wire and positioned in the gallbladder lumen. There was immediate return of bloody bile. A sample was collected and sent to the lab for culture and sensitivities. The drainage catheter was then secured in place with 0  Prolene suture and an adhesive fixation device. The catheter was connected to gravity bag drainage. The patient was returned to the floor in stable condition. IMPRESSION: 1. Interval evolution to frank emphysematous cholecystitis. 2. Successful placement of a 10 French transhepatic  percutaneous cholecystostomy tube using CT guidance. 3. Bile cultures are pending. These results were called by telephone at the time of interpretation on 10/18/2015 at 8:31 am to Dr. Sherrie GeorgeWILLARD Florian Chauca , who verbally acknowledged these results. Signed, Sterling BigHeath K. McCullough, MD [facility] Electronically Signed   By: Malachy MoanHeath  McCullough M.D.   On: 10/18/2015 08:32    Medications: . atenolol  50 mg Oral QHS  . piperacillin-tazobactam (ZOSYN)  IV  3.375 g Intravenous Q8H  . pregabalin  75 mg Oral BID  . rosuvastatin  20 mg Oral Daily   . sodium chloride 125 mL/hr at 10/17/15 1900    Hypertension Hyperlipidemia  Assessment/Plan Emphysematous acalculous cholecystitis S/p IR cholecystostomy tube placement with CT guidance 10/17/15 FEN:  Clear/IV fluids ID:  Zosyn day 3 DVT:  SCD only    Plan:  I discussed the case with Dr. Archer AsaMcCullough, and he is still concerned over the significant change between the first and second CT.  I got labs on him and so far the exam and WBC is improved.  I will discuss with Dr. Maisie Fushomas, and advance him to full liquids this AM.    LOS: 2 days    Kenyada Hy 10/18/2015 [phone]

## 2015-10-19 ENCOUNTER — Inpatient Hospital Stay (HOSPITAL_COMMUNITY): Payer: Medicare Other

## 2015-10-19 DIAGNOSIS — R0902 Hypoxemia: Secondary | ICD-10-CM

## 2015-10-19 DIAGNOSIS — S42213A Unspecified displaced fracture of surgical neck of unspecified humerus, initial encounter for closed fracture: Secondary | ICD-10-CM | POA: Diagnosis present

## 2015-10-19 DIAGNOSIS — S42211S Unspecified displaced fracture of surgical neck of right humerus, sequela: Secondary | ICD-10-CM

## 2015-10-19 LAB — BASIC METABOLIC PANEL
ANION GAP: 8 (ref 5–15)
BUN: 6 mg/dL (ref 6–20)
CHLORIDE: 112 mmol/L — AB (ref 101–111)
CO2: 20 mmol/L — AB (ref 22–32)
Calcium: 7.9 mg/dL — ABNORMAL LOW (ref 8.9–10.3)
Creatinine, Ser: 0.31 mg/dL — ABNORMAL LOW (ref 0.61–1.24)
GFR calc non Af Amer: >60 mL/min (ref >60)
Glucose, Bld: 87 mg/dL (ref 65–99)
POTASSIUM: 3.9 mmol/L (ref 3.5–5.1)
Sodium: 140 mmol/L (ref 135–145)

## 2015-10-19 MED ORDER — HYDROCODONE-ACETAMINOPHEN 5-325 MG PO TABS: 2.0000 | ORAL_TABLET | Freq: Four times a day (QID) | ORAL | Status: DC | PRN

## 2015-10-19 MED ORDER — CIPROFLOXACIN HCL 500 MG PO TABS: 500.0000 mg | ORAL_TABLET | Freq: Two times a day (BID) | ORAL | Status: DC

## 2015-10-19 MED ORDER — IOPAMIDOL (ISOVUE-300) INJECTION 61%
75.0000 mL | Freq: Once | INTRAVENOUS | Status: AC | PRN
  Administered 2015-10-19: 75 mL via INTRAVENOUS

## 2015-10-19 NOTE — Care Management Important Message (Signed)
Important Message  Patient Details  Name: Thomas Raymond MRN: [id number] Date of Birth: 03/08/1946   Medicare Important Message Given:  Yes    Haskell FlirtJamison, Priscila Bean 10/19/2015, 9:03 AMImportant Message  Patient Details  Name: Thomas Raymond MRN: [id number] Date of Birth: 09/24/1945   Medicare Important Message Given:  Yes    Haskell FlirtJamison, Sereena Marando 10/19/2015, 9:03 AM

## 2015-10-19 NOTE — Care Management Note (Signed)
Case Management Note  Patient Details  Name: Thomas Raymond MRN: [id number] Date of Birth: 01/31/1946  Subjective/Objective:   Date:  October 19, 2015 Chart reviewed for concurrent status and case management needs. Will continue to follow the patient for changes and needs:  hlome with [facility];th for tube care and flushes/ representative aware at [facility]. Thomas Raymond, BSN, [place]3, [place]M   [phone]                 Action/Plan:   Expected Discharge Date:                  Expected Discharge Plan:  Home/Self Care  In-House Referral:     Discharge planning Services  CM Consult  Post Acute Care Choice:    Choice offered to:     DME Arranged:    DME Agency:     HH Arranged:    HH Agency:     Status of Service:  In process, will continue to follow  If discussed at Long Length of Stay Meetings, dates discussed:    Additional Comments:  Thomas Raymond, Thomas Basden Lynn, RN 10/19/2015, 9:25 AM

## 2015-10-19 NOTE — Progress Notes (Signed)
PROGRESS NOTE                                                                                                                                                                                                             Patient Demographics:    Rodena MedinSao Levandoski, is a 70 y.o. male, DOB - 05/05/1945, ZOX:[id number]  Admit date - 10/16/2015   Admitting Physician Therisa DoyneAnastassia Doutova, MD  Outpatient Primary MD for the patient is Oliver BarreJames [name], MD  LOS - 3  Outpatient Specialists: None  Chief Complaint  Patient presents with  . Abdominal Pain       Brief Narrative  70 year old male with history of ALS, hypertension, acalculus cholecystitis presented with abdominal pain. He was admitted with similar symptoms in March 2017, was manages supportive care and antibiotics given chronic history of ALS. CT abdomen showed emphysematous cholecystitis and patient underwent percutaneous cholecystostomy drain placement by IR.   Subjective:   Denies any new symptoms. abd pain better   Assessment  & Plan :   Principal problem Emphysematous acalculus cholecystitis  status post cholecystostomy tube placement by IR on 6/27. Symptoms improved. Continue empiric antibiotics. Tolerating clears, diet advanced to soft. Plan on total  2 weeks of abx on discharge. Recommend to continue drain for at least 4-6 weeks.IR to follow up in 4 weeks. Surgery f/up in 5-6 weeks HHRN and PT upon discharge.    Hypoxia  o2 sat drops to 82% on RA. improves on 3L. Afebrile and no tachycardia.   CXR shows new bibasilar opacities, increased right paratracheal density, adenopathy vs infiltrate. Will obtain CT chest to evaluate further. Hold off on discharge.  Hypokalemia/hypomagnesemia Replenish  ALS Continue home medications  Essential hypertension Stable. Continue home medications  Right humeral neck fracture Secondary to fall at home. Was seen in the ED on 6/10.  Had outpt follow up with Dr Roda ShuttersXu on 6/28 but pt cancelled it since he's here. Ive asked him to reschedule his appt within 1-2 weeks.Has  limited mobility of rt arm. Prn pani control.     Code Status : Full code  Family Communication  : wife at bedside  Disposition Plan  : Home possibly tomorrow if resp status stable and CT chest non concerning.  Barriers For Discharge : hypoxia  Consults  :   [facility] IR  Procedures  :  CT-guided percutaneous cholecystostomy drain placement   DVT Prophylaxis  :  Lovenox -   Lab Results  Component Value Date   PLT 175 10/18/2015    Antibiotics  :    Anti-infectives    Start     Dose/Rate Route Frequency Ordered Stop   10/19/15 0000  ciprofloxacin (CIPRO) 500 MG tablet     500 mg Oral 2 times daily 10/19/15 1133 11/01/15 2359   10/17/15 0600  piperacillin-tazobactam (ZOSYN) IVPB 3.375 g     3.375 g 12.5 mL/hr over 240 Minutes Intravenous Every 8 hours 10/16/15 2335     10/16/15 2115  piperacillin-tazobactam (ZOSYN) IVPB 3.375 g     3.375 g 12.5 mL/hr over 240 Minutes Intravenous  Once 10/16/15 2103 10/16/15 2128        Objective:   Filed Vitals:   10/18/15 0449 10/18/15 1500 10/18/15 2022 10/19/15 0508  BP: 122/56 114/52 158/71 148/75  Pulse: 76 65 92 81  Temp: 99.8 F (37.7 C) 97.6 F (36.4 C) 98.1 F (36.7 C) 98.2 F (36.8 C)  TempSrc: Oral Oral Oral Oral  Resp: 20 18 20 20   Height:      Weight:      SpO2: 94% 94% 90% 87%    Wt Readings from Last 3 Encounters:  10/17/15 68.493 kg (151 lb)  08/24/15 68.04 kg (150 lb)  07/19/15 72.576 kg (160 lb)     Intake/Output Summary (Last 24 hours) at 10/19/15 1553 Last data filed at 10/19/15 0701  Gross per 24 hour  Intake      0 ml  Output     80 ml  Net    -80 ml     Physical Exam  Gen: not in distress HEENT:  moist mucosa, supple neck Chest: clear b/l, no added sounds CVS: N S1&S2, no murmurs,  GI: soft,  ND, BS+, Cholecystostomy drain in place, non  tender Musculoskeletal: warm, no edema, limited rt arm mobility. CNS: Alert and oriented    Data Review:    CBC  Recent Labs Lab 10/16/15 1808 10/17/15 0222 10/18/15 0837  WBC 12.9* 13.4* 9.6  HGB 13.9 12.9* 11.1*  HCT 41.8 38.1* 33.2*  PLT 382 313 175  MCV 87.6 85.8 86.5  MCH 29.1 29.1 28.9  MCHC 33.3 33.9 33.4  RDW 13.8 13.7 14.5  LYMPHSABS 2.4  --   --   MONOABS 0.7  --   --   EOSABS 0.1  --   --   BASOSABS 0.0  --   --     Chemistries   Recent Labs Lab 10/16/15 1808 10/17/15 0222 10/18/15 0837 10/19/15 0524  NA 137 138 138 140  K 3.8 3.5 2.6* 3.9  CL 103 107 111 112*  CO2 24 23 22  20*  GLUCOSE 126* 154* 112* 87  BUN 12 9 11 6   CREATININE 0.32* <0.30* <0.30* 0.31*  CALCIUM 8.9 8.4* 7.6* 7.9*  MG  --  1.9 1.7  --   AST 30 136* 42*  --   ALT 27 85* 53  --   ALKPHOS 116 136* 104  --   BILITOT 1.2 0.7 1.1  --    ------------------------------------------------------------------------------------------------------------------ No results for input(s): CHOL, HDL, LDLCALC, TRIG, CHOLHDL, LDLDIRECT in the last 72 hours.  Lab Results  Component Value Date   HGBA1C 6.2 02/23/2015   ------------------------------------------------------------------------------------------------------------------  Recent Labs  10/17/15 0222  TSH 0.629   ------------------------------------------------------------------------------------------------------------------ No results for input(s): VITAMINB12, FOLATE, FERRITIN, TIBC, IRON, RETICCTPCT in the last  72 hours.  Coagulation profile  Recent Labs Lab 10/17/15 1233  INR 1.19    No results for input(s): DDIMER in the last 72 hours.  Cardiac Enzymes No results for input(s): CKMB, TROPONINI, MYOGLOBIN in the last 168 hours.  Invalid input(s): CK ------------------------------------------------------------------------------------------------------------------ No results found for: BNP  Inpatient  Medications  Scheduled Meds: . atenolol  50 mg Oral QHS  . piperacillin-tazobactam (ZOSYN)  IV  3.375 g Intravenous Q8H  . pregabalin  75 mg Oral BID  . rosuvastatin  20 mg Oral Daily   Continuous Infusions: . sodium chloride 75 mL/hr at 10/19/15 0819   PRN Meds:.acetaminophen **OR** acetaminophen, hydrALAZINE, HYDROmorphone (DILAUDID) injection, midazolam, ondansetron **OR** ondansetron (ZOFRAN) IV  Micro Results Recent Results (from the past 240 hour(s))  Surgical PCR screen     Status: None   Collection Time: 10/16/15 11:58 PM  Result Value Ref Range Status   MRSA, PCR NEGATIVE NEGATIVE Final   Staphylococcus aureus NEGATIVE NEGATIVE Final    Comment:        The Xpert SA Assay (FDA approved for NASAL specimens in patients over 47 years of age), is one component of a comprehensive surveillance program.  Test performance has been validated by [facility] for patients greater than or equal to 58 year old. It is not intended to diagnose infection nor to guide or monitor treatment.   Aerobic/Anaerobic Culture (surgical/deep wound)     Status: None (Preliminary result)   Collection Time: 10/17/15  6:16 PM  Result Value Ref Range Status   Specimen Description BILE  Final   Special Requests Normal  Final   Gram Stain   Final    ABUNDANT WBC PRESENT, PREDOMINANTLY PMN ABUNDANT GRAM VARIABLE ROD    Culture   Final    NO GROWTH 2 DAYS NO ANAEROBES ISOLATED; CULTURE IN PROGRESS FOR 5 DAYS Performed at [facility]    Report Status PENDING  Incomplete    Radiology Reports Dg Shoulder Right  09/30/2015  CLINICAL DATA:  70 year old male status post fall with pain in the right shoulder EXAM: RIGHT SHOULDER - 2+ VIEW COMPARISON:  None. FINDINGS: There is an acute displaced fracture through the anatomic neck of the humerus. There is surrounding soft tissue swelling and likely hematoma. The humeral head remains located with respect to the glenoid. The visualized thorax is  unremarkable. IMPRESSION: Acute displaced fracture through the anatomic neck of the humerus. Electronically Signed   By: Malachy Moan M.D.   On: 09/30/2015 13:05   US Abdomen Complete  10/17/2015  CLINICAL DATA:  Cholecystitis EXAM: ABDOMEN ULTRASOUND COMPLETE COMPARISON:  CT scan 10/16/2015 FINDINGS: Gallbladder: There is limited visualization and assessment of the gallbladder due to air artifact from intraluminal air as noted on yesterday CT scan. Acute cholecystitis cannot be excluded. Clinic calculation is necessary. Further correlation with biliary scan could be performed as clinically warranted. Common bile duct: Diameter: 2 mm in diameter within normal limits. Liver: No definite focal hepatic mass. IVC: No abnormality visualized. Pancreas: Visualized portion unremarkable. Spleen: Size and appearance within normal limits.  Measures 5.7 cm. Right Kidney: Length: 9.8 cm. There is normal echogenicity. A cyst is noted in lower pole measures 1.8 x 1.3 cm. Second cyst in lower pole measures 1.1 cm Left Kidney: Length: 11.1 cm. Echogenicity within normal limits. No mass or hydronephrosis visualized. There is a midpole cyst measures 1 x 0.8 cm. Abdominal aorta: No aneurysm visualized. Measures up to 2.8 cm in diameter. Other findings: None. IMPRESSION:  1. Significant limited visualization and assessment of the gallbladder due to intraluminal air as noted on yesterday CT scan. Cholecystitis cannot be excluded. Clinical correlation is necessary. Further evaluation with biliary scan could be performed as clinically warranted. 2. Normal CBD. 3. No hydronephrosis.  Bilateral renal cysts. Electronically Signed   By: Natasha Mead M.D.   On: 10/17/2015 16:36   Ct Abdomen Pelvis W Contrast  10/16/2015  CLINICAL DATA:  Left lower quadrant pain after eating lunch today. EXAM: CT ABDOMEN AND PELVIS WITH CONTRAST TECHNIQUE: Multidetector CT imaging of the abdomen and pelvis was performed using the standard protocol  following bolus administration of intravenous contrast. CONTRAST:  ISOVUE-300 IOPAMIDOL (ISOVUE-300) INJECTION 61% COMPARISON:  CT scan dated 07/18/2013 FINDINGS: Lower chest:  No acute findings. Hepatobiliary: There is air in the gallbladder. No air in the bile ducts. There is slight chronic prominence of the intra and extrahepatic bile ducts which is stable. Slight chronic thickening of the gallbladder wall. No air within the wall of the gallbladder. No liver masses. Pancreas: No mass, inflammatory changes, or other significant abnormality. Spleen: Within normal limits in size and appearance. Adrenals/Urinary Tract: Normal adrenal glands. Three small cysts in the right kidney, unchanged, the largest being 15 mm in the lower pole. 2 tiny cysts in the left kidney. No hydronephrosis. Bladder is normal. Stomach/Bowel: The bowel appears normal including the terminal ileum and appendix. No diverticular disease. Vascular/Lymphatic: Aortic atherosclerosis.  No adenopathy. Reproductive: Normal. Other: No free air or free fluid. Musculoskeletal: Severe atrophy of paraspinal muscles, pelvic muscles, and buttock and proximal thigh muscles. Chronic degenerative disc and joint disease in the lumbar spine. Intra medullary nail and compression screw in the proximal left femur. IMPRESSION: 1. New air in the gallbladder with chronic thickening of the gallbladder wall. This could represent emphysematous cholecystitis. 2. No other significant change since the prior exam. 3. Severe atrophy of the muscles of the pelvis, paraspinal muscles and proximal thigh muscles. Electronically Signed   By: Francene Boyers M.D.   On: 10/16/2015 20:00   Dg Chest Port 1 View  10/19/2015  CLINICAL DATA:  Hypoxia.  History of hypertension. EXAM: PORTABLE CHEST 1 VIEW COMPARISON:  02/07/2014 FINDINGS: Heart is enlarged. Patchy infiltrates are identified at the lung bases bilaterally, increased since prior study. Elevation of right hemidiaphragm  appears stable. There is increased right paratracheal density compared prior studies. IMPRESSION: 1. Interval development of bibasilar opacities, favoring infectious process or atelectasis. 2. Increased right paratracheal density raising the question of adenopathy, infiltrate, or tortuous great vessels. However, given the interval change, further evaluation is recommended. 3. CT of the chest is recommended. Intravenous contrast is recommended unless it is contraindicated. These results will be called to the ordering clinician or representative by the Radiologist Assistant, and communication documented in the PACS or zVision Dashboard. Electronically Signed   By: Norva Pavlov M.D.   On: 10/19/2015 15:45   Ct Image Guided Drainage By Percutaneous Catheter  10/18/2015  INDICATION: 70 year old male with a history of amyotrophic lateral sclerosis and recurrent acalculous cholecystitis. He is an extremely poor surgical candidate given his diagnosis of ALS. He presents for percutaneous cholecystostomy tube placement. Unfortunately, this could not be performed in the usual fashion with ultrasound guidance given evolution to frank emphysematous cholecystitis which obscures the gallbladder. Therefore, cholecystostomy tube placement will be performed with CT guidance. EXAM: CT GUIDED CHOLECYSTOSTOMY TUBE PLACEMENT MEDICATIONS: Patient is on intravenous Zosyn 3.375 g q.8 hours. No additional antibiotic prophylaxis was given. ANESTHESIA/SEDATION:  Moderate (conscious) sedation was employed during this procedure. A total of Versed 2 mg and Fentanyl 100 mcg was administered intravenously. Moderate Sedation Time: 13 minutes. The patient's level of consciousness and vital signs were monitored continuously by radiology nursing throughout the procedure under my direct supervision. FLUOROSCOPY TIME:  None. COMPLICATIONS: None immediate. Estimated blood loss:  0 PROCEDURE: Informed written consent was obtained from the patient  after a thorough discussion of the procedural risks, benefits and alternatives. All questions were addressed. A timeout was performed prior to the initiation of the procedure. A planning axial CT scan was performed. There is persistent colonic interposition. The: Drapes over the gallbladder. There is been evolution to fall minute emphysematous cholecystitis with a significant increase in gallbladder wall thickening and stranding in the pericholecystic fat. A suitable transhepatic window that would avoid the colon was selected. The skin was marked. The region was then sterilely prepped and draped in standard fashion using chlorhexidine skin prep. Local anesthesia was attained by infiltration with 1% lidocaine. A small dermatotomy was made. Under intermittent CT guidance, an 18 gauge trocar needle was advanced along a short transhepatic course into the gallbladder lumen. A 0.035 Amplatz wire was then coiled within the gallbladder lumen in the tract dilated to 10 [place]. A Cook 10 [place] all-purpose drainage catheter was then advanced over the wire and positioned in the gallbladder lumen. There was immediate return of bloody bile. A sample was collected and sent to the lab for culture and sensitivities. The drainage catheter was then secured in place with 0 Prolene suture and an adhesive fixation device. The catheter was connected to gravity bag drainage. The patient was returned to the floor in stable condition. IMPRESSION: 1. Interval evolution to frank emphysematous cholecystitis. 2. Successful placement of a 10 French transhepatic percutaneous cholecystostomy tube using CT guidance. 3. Bile cultures are pending. These results were called by telephone at the time of interpretation on 10/18/2015 at 8:31 am to Dr. Sherrie George , who verbally acknowledged these results. Signed, Sterling Big, MD [facility] Radiology Electronically Signed   By: Malachy Moan  M.D.   On: 10/18/2015 08:32    Time Spent in minutes  25   Eddie North M.D on 10/19/2015 at 3:53 PM  Between 7am to 7pm - Pager - [phone]  After 7pm go to [email or web address] - password [facility]  [facility] -  Office  [phone]

## 2015-10-19 NOTE — Progress Notes (Signed)
Patient ID: Thomas Raymond, male   DOB: [birth year], 70 y.o.   MRN: [id number]    Referring Physician(s): Clovis Pu  Supervising Physician: Simonne Come  Patient Status:  Inpatient  Chief Complaint:  cholecystitis  Subjective:  Pt denies sig abd pain,N/V; still having a few loose stools  Allergies: Review of patient's allergies indicates no known allergies.  Medications: Prior to Admission medications   Medication Sig Start Date End Date Taking? Authorizing Provider  acetaminophen (TYLENOL) 325 MG tablet Take 2 tablets (650 mg total) by mouth every 6 (six) hours as needed for mild pain (or temp > 100). 07/21/15  Yes Emina Riebock, NP  amLODipine-benazepril (LOTREL) 10-40 MG capsule TAKE ONE CAPSULE BY MOUTH EVERY DAY 07/25/15  Yes Corwin Levins, MD  atenolol (TENORMIN) 50 MG tablet Take 1 tablet (50 mg total) by mouth at bedtime. 02/23/15  Yes Corwin Levins, MD  cetirizine (ZYRTEC) 10 MG tablet Take 1 tablet (10 mg total) by mouth daily. 02/23/15  Yes Corwin Levins, MD  HYDROcodone-acetaminophen (NORCO/VICODIN) 5-325 MG tablet Take 1-2 tablets by mouth every 4 (four) hours as needed. Patient taking differently: Take 1-2 tablets by mouth every 4 (four) hours as needed for moderate pain or severe pain.  09/30/15  Yes Linwood Dibbles, MD  omeprazole (PRILOSEC) 20 MG capsule TAKE ONE CAPSULE BY MOUTH EVERY DAY Patient taking differently: TAKE 20 MG BY MOUTH EVERY DAY 09/25/15  Yes Corwin Levins, MD  pregabalin (LYRICA) 75 MG capsule Take 1 capsule (75 mg total) by mouth 2 (two) times daily. 08/24/15  Yes Corwin Levins, MD  rosuvastatin (CRESTOR) 20 MG tablet Take 1 tablet (20 mg total) by mouth daily. 09/26/14  Yes Corwin Levins, MD     Vital Signs: BP 148/75 mmHg  Pulse 81  Temp(Src) 98.2 F (36.8 C) (Oral)  Resp 20  Ht  (1.651 m)  Wt 151 lb (68.493 kg)  BMI 25.13 kg/m2  SpO2 87%  Physical Exam GB drain intact, insertion site ok, minimal tenderness; output 50 cc blood-tinged bile; cx's  pend  Imaging: US Abdomen Complete  10/17/2015  CLINICAL DATA:  Cholecystitis EXAM: ABDOMEN ULTRASOUND COMPLETE COMPARISON:  CT scan 10/16/2015 FINDINGS: Gallbladder: There is limited visualization and assessment of the gallbladder due to air artifact from intraluminal air as noted on yesterday CT scan. Acute cholecystitis cannot be excluded. Clinic calculation is necessary. Further correlation with biliary scan could be performed as clinically warranted. Common bile duct: Diameter: 2 mm in diameter within normal limits. Liver: No definite focal hepatic mass. IVC: No abnormality visualized. Pancreas: Visualized portion unremarkable. Spleen: Size and appearance within normal limits.  Measures 5.7 cm. Right Kidney: Length: 9.8 cm. There is normal echogenicity. A cyst is noted in lower pole measures 1.8 x 1.3 cm. Second cyst in lower pole measures 1.1 cm Left Kidney: Length: 11.1 cm. Echogenicity within normal limits. No mass or hydronephrosis visualized. There is a midpole cyst measures 1 x 0.8 cm. Abdominal aorta: No aneurysm visualized. Measures up to 2.8 cm in diameter. Other findings: None. IMPRESSION: 1. Significant limited visualization and assessment of the gallbladder due to intraluminal air as noted on yesterday CT scan. Cholecystitis cannot be excluded. Clinical correlation is necessary. Further evaluation with biliary scan could be performed as clinically warranted. 2. Normal CBD. 3. No hydronephrosis.  Bilateral renal cysts. Electronically Signed   By: Natasha Mead M.D.   On: 10/17/2015 16:36   Ct Abdomen Pelvis W Contrast  10/16/2015  CLINICAL DATA:  Left lower quadrant pain after eating lunch today. EXAM: CT ABDOMEN AND PELVIS WITH CONTRAST TECHNIQUE: Multidetector CT imaging of the abdomen and pelvis was performed using the standard protocol following bolus administration of intravenous contrast. CONTRAST:  100mL ISOVUE-300 IOPAMIDOL (ISOVUE-300) INJECTION 61% COMPARISON:  CT scan dated 07/18/2013  FINDINGS: Lower chest:  No acute findings. Hepatobiliary: There is air in the gallbladder. No air in the bile ducts. There is slight chronic prominence of the intra and extrahepatic bile ducts which is stable. Slight chronic thickening of the gallbladder wall. No air within the wall of the gallbladder. No liver masses. Pancreas: No mass, inflammatory changes, or other significant abnormality. Spleen: Within normal limits in size and appearance. Adrenals/Urinary Tract: Normal adrenal glands. Three small cysts in the right kidney, unchanged, the largest being 15 mm in the lower pole. 2 tiny cysts in the left kidney. No hydronephrosis. Bladder is normal. Stomach/Bowel: The bowel appears normal including the terminal ileum and appendix. No diverticular disease. Vascular/Lymphatic: Aortic atherosclerosis.  No adenopathy. Reproductive: Normal. Other: No free air or free fluid. Musculoskeletal: Severe atrophy of paraspinal muscles, pelvic muscles, and buttock and proximal thigh muscles. Chronic degenerative disc and joint disease in the lumbar spine. Intra medullary nail and compression screw in the proximal left femur. IMPRESSION: 1. New air in the gallbladder with chronic thickening of the gallbladder wall. This could represent emphysematous cholecystitis. 2. No other significant change since the prior exam. 3. Severe atrophy of the muscles of the pelvis, paraspinal muscles and proximal thigh muscles. Electronically Signed   By: Francene BoyersJames  Maxwell M.D.   On: 10/16/2015 20:00   Ct Image Guided Drainage By Percutaneous Catheter  10/18/2015  INDICATION: 70 year old male with a history of amyotrophic lateral sclerosis and recurrent acalculous cholecystitis. He is an extremely poor surgical candidate given his diagnosis of ALS. He presents for percutaneous cholecystostomy tube placement. Unfortunately, this could not be performed in the usual fashion with ultrasound guidance given evolution to frank emphysematous cholecystitis  which obscures the gallbladder. Therefore, cholecystostomy tube placement will be performed with CT guidance. EXAM: CT GUIDED CHOLECYSTOSTOMY TUBE PLACEMENT MEDICATIONS: Patient is on intravenous Zosyn 3.375 g q.8 hours. No additional antibiotic prophylaxis was given. ANESTHESIA/SEDATION: Moderate (conscious) sedation was employed during this procedure. A total of Versed 2 mg and Fentanyl 100 mcg was administered intravenously. Moderate Sedation Time: 13 minutes. The patient's level of consciousness and vital signs were monitored continuously by radiology nursing throughout the procedure under my direct supervision. FLUOROSCOPY TIME:  None. COMPLICATIONS: None immediate. Estimated blood loss:  0 PROCEDURE: Informed written consent was obtained from the patient after a thorough discussion of the procedural risks, benefits and alternatives. All questions were addressed. A timeout was performed prior to the initiation of the procedure. A planning axial CT scan was performed. There is persistent colonic interposition. The: Drapes over the gallbladder. There is been evolution to fall minute emphysematous cholecystitis with a significant increase in gallbladder wall thickening and stranding in the pericholecystic fat. A suitable transhepatic window that would avoid the colon was selected. The skin was marked. The region was then sterilely prepped and draped in standard fashion using chlorhexidine skin prep. Local anesthesia was attained by infiltration with 1% lidocaine. A small dermatotomy was made. Under intermittent CT guidance, an 18 gauge trocar needle was advanced along a short transhepatic course into the gallbladder lumen. A 0.035 Amplatz wire was then coiled within the gallbladder lumen in the tract dilated to 10 [place]. A Cook 10 [place] all-purpose drainage  catheter was then advanced over the wire and positioned in the gallbladder lumen. There was immediate return of bloody bile. A sample was collected and sent  to the lab for culture and sensitivities. The drainage catheter was then secured in place with 0 Prolene suture and an adhesive fixation device. The catheter was connected to gravity bag drainage. The patient was returned to the floor in stable condition. IMPRESSION: 1. Interval evolution to frank emphysematous cholecystitis. 2. Successful placement of a 10 French transhepatic percutaneous cholecystostomy tube using CT guidance. 3. Bile cultures are pending. These results were called by telephone at the time of interpretation on 10/18/2015 at 8:31 am to Dr. Sherrie GeorgeWILLARD JENNINGS , who verbally acknowledged these results. Signed, Sterling BigHeath K. McCullough, MD [facility] Electronically Signed   By: Malachy MoanHeath  McCullough M.D.   On: 10/18/2015 08:32    Labs:  CBC:  Recent Labs  07/21/15 0424 10/16/15 1808 10/17/15 0222 10/18/15 0837  WBC 5.1 12.9* 13.4* 9.6  HGB 12.0* 13.9 12.9* 11.1*  HCT 37.1* 41.8 38.1* 33.2*  PLT 211 382 313 175    COAGS:  Recent Labs  10/17/15 1233  INR 1.19    BMP:  Recent Labs  10/16/15 1808 10/17/15 0222 10/18/15 0837 10/19/15 0524  NA 137 138 138 140  K 3.8 3.5 2.6* 3.9  CL 103 107 111 112*  CO2 24 23 22  20*  GLUCOSE 126* 154* 112* 87  BUN 12 9 11 6   CALCIUM 8.9 8.4* 7.6* 7.9*  CREATININE 0.32* <0.30* <0.30* 0.31*  GFRNONAA >60 NOT CALCULATED NOT CALCULATED >60  GFRAA >60 NOT CALCULATED NOT CALCULATED >60    LIVER FUNCTION TESTS:  Recent Labs  07/21/15 0424 10/16/15 1808 10/17/15 0222 10/18/15 0837  BILITOT 0.6 1.2 0.7 1.1  AST 65* 30 136* 42*  ALT 87* 27 85* 53  ALKPHOS 116 116 136* 104  PROT 6.4* 8.3* 7.7 5.5*  ALBUMIN 3.0* 4.2 3.8 2.5*    Assessment and Plan: Pt with hx emphysematous cholecystitis, s/p perc cholecystostomy 6/27; AF; bile cx's pend; creat ok; drain should remain in place for 4-6 weeks; as OP drain should be irrigated with 5 cc sterile NS daily , output recorded and  dressing changed every 1-2 days; will set up for GB drain injection at IR clinic in 4 weeks; needs f/u with ortho regarding humeral neck fx; other plans as per CCS/IM   Electronically Signed: D. Jeananne RamaKevin Allred 10/19/2015, 10:46 AM   I spent a total of 15 minutes at the the patient's bedside AND on the patient's hospital floor or unit, greater than 50% of which was counseling/coordinating care for percutaneous cholecystostomy

## 2015-10-19 NOTE — Progress Notes (Signed)
Subjective: Interpreter:   [id number] He seems to be doing very well with the drain.  Still sore, but feeling much better.  He is unable to walk because of his ALS.  Was up to the chair yesterday.    Objective: Vital signs in last 24 hours: Temp:  [97.6 F (36.4 C)-98.2 F (36.8 C)] 98.2 F (36.8 C) (06/29 0508) Pulse Rate:  [65-92] 81 (06/29 0508) Resp:  [18-20] 20 (06/29 0508) BP: (114-158)/(52-75) 148/75 mmHg (06/29 0508) SpO2:  [87 %-94 %] 87 % (06/29 0508) Last BM Date: 10/18/15 I/O nothing recorded Afebrile, VSS BMP OK no CBC Intake/Output from previous day: 06/28 0701 - 06/29 0700 In: -  Out: 30  Intake/Output this shift: Total I/O In: -  Out: 50 [Other:50]  General appearance: alert, cooperative and no distress GI: soft, sore, site OK, abdomen is much less tender, just sore now.  tolerating diet  Lab Results:   Recent Labs  10/17/15 0222 10/18/15 0837  WBC 13.4* 9.6  HGB 12.9* 11.1*  HCT 38.1* 33.2*  PLT 313 175    BMET  Recent Labs  10/18/15 0837 10/19/15 0524  NA 138 140  K 2.6* 3.9  CL 111 112*  CO2 22 20*  GLUCOSE 112* 87  BUN 11 6  CREATININE <0.30* 0.31*  CALCIUM 7.6* 7.9*   PT/INR  Recent Labs  10/17/15 1233  LABPROT 14.8  INR 1.19     Recent Labs Lab 10/16/15 1808 10/17/15 0222 10/18/15 0837  AST 30 136* 42*  ALT 27 85* 53  ALKPHOS 116 136* 104  BILITOT 1.2 0.7 1.1  PROT 8.3* 7.7 5.5*  ALBUMIN 4.2 3.8 2.5*     Lipase     Component Value Date/Time   LIPASE 27 10/16/2015 1808     Studies/Results: [place] Abdomen Complete  10/17/2015  CLINICAL DATA:  Cholecystitis EXAM: ABDOMEN ULTRASOUND COMPLETE COMPARISON:  CT scan 10/16/2015 FINDINGS: Gallbladder: There is limited visualization and assessment of the gallbladder due to air artifact from intraluminal air as noted on yesterday CT scan. Acute cholecystitis cannot be excluded. Clinic calculation is necessary. Further correlation with biliary scan could be performed as  clinically warranted. Common bile duct: Diameter: 2 mm in diameter within normal limits. Liver: No definite focal hepatic mass. IVC: No abnormality visualized. Pancreas: Visualized portion unremarkable. Spleen: Size and appearance within normal limits.  Measures 5.7 cm. Right Kidney: Length: 9.8 cm. There is normal echogenicity. A cyst is noted in lower pole measures 1.8 x 1.3 cm. Second cyst in lower pole measures 1.1 cm Left Kidney: Length: 11.1 cm. Echogenicity within normal limits. No mass or hydronephrosis visualized. There is a midpole cyst measures 1 x 0.8 cm. Abdominal aorta: No aneurysm visualized. Measures up to 2.8 cm in diameter. Other findings: None. IMPRESSION: 1. Significant limited visualization and assessment of the gallbladder due to intraluminal air as noted on yesterday CT scan. Cholecystitis cannot be excluded. Clinical correlation is necessary. Further evaluation with biliary scan could be performed as clinically warranted. 2. Normal CBD. 3. No hydronephrosis.  Bilateral renal cysts. Electronically Signed   By: Natasha MeadLiviu  Pop M.D.   On: 10/17/2015 16:36   Ct Image Guided Drainage By Percutaneous Catheter  10/18/2015  INDICATION: 70 year old male with a history of amyotrophic lateral sclerosis and recurrent acalculous cholecystitis. He is an extremely poor surgical candidate given his diagnosis of ALS. He presents for percutaneous cholecystostomy tube placement. Unfortunately, this could not be performed in the usual fashion with ultrasound guidance given evolution to  frank emphysematous cholecystitis which obscures the gallbladder. Therefore, cholecystostomy tube placement will be performed with CT guidance. EXAM: CT GUIDED CHOLECYSTOSTOMY TUBE PLACEMENT MEDICATIONS: Patient is on intravenous Zosyn 3.375 g q.8 hours. No additional antibiotic prophylaxis was given. ANESTHESIA/SEDATION: Moderate (conscious) sedation was employed during this procedure. A total of Versed 2 mg and Fentanyl 100 mcg  was administered intravenously. Moderate Sedation Time: 13 minutes. The patient's level of consciousness and vital signs were monitored continuously by radiology nursing throughout the procedure under my direct supervision. FLUOROSCOPY TIME:  None. COMPLICATIONS: None immediate. Estimated blood loss:  0 PROCEDURE: Informed written consent was obtained from the patient after a thorough discussion of the procedural risks, benefits and alternatives. All questions were addressed. A timeout was performed prior to the initiation of the procedure. A planning axial CT scan was performed. There is persistent colonic interposition. The: Drapes over the gallbladder. There is been evolution to fall minute emphysematous cholecystitis with a significant increase in gallbladder wall thickening and stranding in the pericholecystic fat. A suitable transhepatic window that would avoid the colon was selected. The skin was marked. The region was then sterilely prepped and draped in standard fashion using chlorhexidine skin prep. Local anesthesia was attained by infiltration with 1% lidocaine. A small dermatotomy was made. Under intermittent CT guidance, an 18 gauge trocar needle was advanced along a short transhepatic course into the gallbladder lumen. A 0.035 Amplatz wire was then coiled within the gallbladder lumen in the tract dilated to 10 [place]. A Cook 10 [place] all-purpose drainage catheter was then advanced over the wire and positioned in the gallbladder lumen. There was immediate return of bloody bile. A sample was collected and sent to the lab for culture and sensitivities. The drainage catheter was then secured in place with 0 Prolene suture and an adhesive fixation device. The catheter was connected to gravity bag drainage. The patient was returned to the floor in stable condition. IMPRESSION: 1. Interval evolution to frank emphysematous cholecystitis. 2. Successful placement of a 10 French transhepatic percutaneous  cholecystostomy tube using CT guidance. 3. Bile cultures are pending. These results were called by telephone at the time of interpretation on 10/18/2015 at 8:31 am to Dr. Sherrie GeorgeWILLARD Roberto Hlavaty , who verbally acknowledged these results. Signed, Sterling BigHeath K. McCullough, MD [facility] Electronically Signed   By: Malachy MoanHeath  McCullough M.D.   On: 10/18/2015 08:32    Medications: . atenolol  50 mg Oral QHS  . piperacillin-tazobactam (ZOSYN)  IV  3.375 g Intravenous Q8H  . pregabalin  75 mg Oral BID  . rosuvastatin  20 mg Oral Daily   Hypertension Hyperlipidemia Right humeral head fracture 09/30/15  Assessment/Plan  Emphysematous acalculous cholecystitis S/p IR cholecystostomy tube placement with CT guidance 10/17/15 FEN: full liquids/IV fluids - go to low fat diet ID: Zosyn day 4 DVT: SCD only   Plan:  I have ask Case manager to see and see what services are available for them.  He will go home with the drain.  He needs a total of 2 weeks of antibiotics.  He will follow up with Dr. Maisie Fushomas in 5-6 weeks in our office.  Staff will begin teaching drain care.        LOS: 3 days    Steward Sames 10/19/2015 [phone]

## 2015-10-19 NOTE — Progress Notes (Addendum)
SATURATION QUALIFICATIONS: (This note is used to comply with regulatory documentation for home oxygen)  Patient Saturations on Room Air at Rest = 82%  Patient Saturations on Room Air while Ambulating = pt wheelchair bound  Patient Saturations on 3 Liters of oxygen while Ambulating pt wheelchair bound  Please briefly explain why patient needs home oxygen:    Pt oxygen saturation 82 % at rest. Pt is wheelchair bound and unable to ambulate. Pt oxygen saturation 94 % on 3L at rest.

## 2015-10-20 ENCOUNTER — Inpatient Hospital Stay (HOSPITAL_COMMUNITY): Payer: Medicare Other

## 2015-10-20 DIAGNOSIS — J9601 Acute respiratory failure with hypoxia: Secondary | ICD-10-CM

## 2015-10-20 LAB — COMPREHENSIVE METABOLIC PANEL
ALBUMIN: 2.6 g/dL — AB (ref 3.5–5.0)
ALT: 32 U/L (ref 17–63)
ANION GAP: 6 (ref 5–15)
AST: 23 U/L (ref 15–41)
Alkaline Phosphatase: 132 U/L — ABNORMAL HIGH (ref 38–126)
BUN: 7 mg/dL (ref 6–20)
CHLORIDE: 111 mmol/L (ref 101–111)
CO2: 24 mmol/L (ref 22–32)
Calcium: 8 mg/dL — ABNORMAL LOW (ref 8.9–10.3)
Creatinine, Ser: 0.33 mg/dL — ABNORMAL LOW (ref 0.61–1.24)
GFR calc Af Amer: >60 mL/min (ref >60)
GFR calc non Af Amer: >60 mL/min (ref >60)
GLUCOSE: 108 mg/dL — AB (ref 65–99)
POTASSIUM: 3.4 mmol/L — AB (ref 3.5–5.1)
SODIUM: 141 mmol/L (ref 135–145)
TOTAL PROTEIN: 6.2 g/dL — AB (ref 6.5–8.1)
Total Bilirubin: 0.4 mg/dL (ref 0.3–1.2)

## 2015-10-20 LAB — CBC
HCT: 38.6 % — ABNORMAL LOW (ref 39.0–52.0)
Hemoglobin: 12.5 g/dL — ABNORMAL LOW (ref 13.0–17.0)
MCH: 28.5 pg (ref 26.0–34.0)
MCHC: 32.4 g/dL (ref 30.0–36.0)
MCV: 88.1 fL (ref 78.0–100.0)
PLATELETS: 219 10*3/uL (ref 150–400)
RBC: 4.38 MIL/uL (ref 4.22–5.81)
RDW: 14.9 % (ref 11.5–15.5)
WBC: 9.7 10*3/uL (ref 4.0–10.5)

## 2015-10-20 LAB — AEROBIC/ANAEROBIC CULTURE W GRAM STAIN (SURGICAL/DEEP WOUND)

## 2015-10-20 LAB — AEROBIC/ANAEROBIC CULTURE (SURGICAL/DEEP WOUND): SPECIAL REQUESTS: NORMAL

## 2015-10-20 MED ORDER — IPRATROPIUM-ALBUTEROL 0.5-2.5 (3) MG/3ML IN SOLN
3.0000 mL | Freq: Three times a day (TID) | RESPIRATORY_TRACT | Status: DC
  Administered 2015-10-21: 3 mL via RESPIRATORY_TRACT
  Filled 2015-10-20 (×2): qty 3

## 2015-10-20 MED ORDER — FUROSEMIDE 10 MG/ML IJ SOLN
40.0000 mg | Freq: Once | INTRAMUSCULAR | Status: AC
  Administered 2015-10-20: 40 mg via INTRAVENOUS
  Filled 2015-10-20: qty 4

## 2015-10-20 MED ORDER — IPRATROPIUM-ALBUTEROL 0.5-2.5 (3) MG/3ML IN SOLN
3.0000 mL | Freq: Four times a day (QID) | RESPIRATORY_TRACT | Status: DC
  Administered 2015-10-20 (×3): 3 mL via RESPIRATORY_TRACT
  Filled 2015-10-20 (×3): qty 3

## 2015-10-20 NOTE — Progress Notes (Signed)
Subjective:  He seems to be doing well with the drain.  Now having some hypoxia.  CT chest shows drain in place and no PE.  Still sore, but feeling much better.    Objective: Vital signs in last 24 hours: Temp:  [97.9 F (36.6 C)-98.4 F (36.9 C)] 97.9 F (36.6 C) (06/30 0548) Pulse Rate:  [58-81] 58 (06/30 0548) Resp:  [18-20] 18 (06/30 0548) BP: (144-167)/(66-94) 167/69 mmHg (06/30 0548) SpO2:  [89 %-95 %] 91 % (06/30 0548) Last BM Date: 10/19/15  Intake/Output from previous day: 06/29 0701 - 06/30 0700 In: 1400 [P.O.:450; I.V.:900; IV Piggyback:50] Out: 150  Intake/Output this shift: Total I/O In: 90 [P.O.:90] Out: 100 [Other:100]  General appearance: alert, cooperative and no distress GI: soft, sore, site OK, abdomen is much less tender, just sore now.  tolerating diet  Lab Results:   Recent Labs  10/18/15 0837 10/20/15 0549  WBC 9.6 9.7  HGB 11.1* 12.5*  HCT 33.2* 38.6*  PLT 175 219    BMET  Recent Labs  10/19/15 0524 10/20/15 0549  NA 140 141  K 3.9 3.4*  CL 112* 111  CO2 20* 24  GLUCOSE 87 108*  BUN 6 7  CREATININE 0.31* 0.33*  CALCIUM 7.9* 8.0*   PT/INR  Recent Labs  10/17/15 1233  LABPROT 14.8  INR 1.19     Recent Labs Lab 10/16/15 1808 10/17/15 0222 10/18/15 0837 10/20/15 0549  AST 30 136* 42* 23  ALT 27 85* 53 32  ALKPHOS 116 136* 104 132*  BILITOT 1.2 0.7 1.1 0.4  PROT 8.3* 7.7 5.5* 6.2*  ALBUMIN 4.2 3.8 2.5* 2.6*     Lipase     Component Value Date/Time   LIPASE 27 10/16/2015 1808     Studies/Results: Ct Chest W Contrast  10/19/2015  CLINICAL DATA:  History of talus.  Patient presents with hypoxia. EXAM: CT CHEST WITH CONTRAST TECHNIQUE: Multidetector CT imaging of the chest was performed during intravenous contrast administration. CONTRAST:  75mL ISOVUE-300 IOPAMIDOL (ISOVUE-300) INJECTION 61% COMPARISON:  Chest radiograph 10/19/2015 FINDINGS: Mediastinum/Lymph Nodes: Multiple partially calcified post sub  pathologic in size mediastinal lymph nodes. No evidence of central pulmonary embolus or thoracic dissection. Calcific atherosclerotic disease of the thoracic aorta and coronary arteries. Lungs/Pleura: There is a small right pleural effusion. Bilateral lower lobe atelectatic changes are seen, worse in the right lower lobe were there is segmental atelectasis. Segmental atelectasis of the lateral segment of the right middle lobe is also seen. There is background of paraseptal upper lobe predominant emphysema. Upper abdomen: Pigtail catheters seen within right upper quadrant focal fluid and gas containing collection measuring 8.1 by 5.0 cm. There are surrounding mesenteric inflammatory changes. Musculoskeletal: No chest wall mass or suspicious bone lesions identified. No other acute abnormalities. IMPRESSION: No evidence of pulmonary embolus. Multiple sub pathologic in size partially calcified mediastinal lymph nodes, usually seen as sequela of prior granulomatous disease. Small right pleural effusion with right lower and middle lobe segmental and subsegmental atelectasis. Milder left lower lobe subsegmental atelectasis. Background of upper lobe predominant emphysematous changes. Pigtail catheter within complex fluid and gas collection in the right upper quadrant of the abdomen. Electronically Signed   By: Ted Mcalpineobrinka  Dimitrova M.D.   On: 10/19/2015 19:04   Dg Chest Port 1 View  10/19/2015  CLINICAL DATA:  Hypoxia.  History of hypertension. EXAM: PORTABLE CHEST 1 VIEW COMPARISON:  02/07/2014 FINDINGS: Heart is enlarged. Patchy infiltrates are identified at the lung bases bilaterally, increased since prior  study. Elevation of right hemidiaphragm appears stable. There is increased right paratracheal density compared prior studies. IMPRESSION: 1. Interval development of bibasilar opacities, favoring infectious process or atelectasis. 2. Increased right paratracheal density raising the question of adenopathy, infiltrate,  or tortuous great vessels. However, given the interval change, further evaluation is recommended. 3. CT of the chest is recommended. Intravenous contrast is recommended unless it is contraindicated. These results will be called to the ordering clinician or representative by the Radiologist Assistant, and communication documented in the PACS or zVision Dashboard. Electronically Signed   By: Norva PavlovElizabeth  Brown M.D.   On: 10/19/2015 15:45    Medications: . atenolol  50 mg Oral QHS  . piperacillin-tazobactam (ZOSYN)  IV  3.375 g Intravenous Q8H  . pregabalin  75 mg Oral BID  . rosuvastatin  20 mg Oral Daily   Hypertension Hyperlipidemia Right humeral head fracture 09/30/15  Assessment/Plan  Emphysematous acalculous cholecystitis S/p IR cholecystostomy tube placement with CT guidance 10/17/15 FEN:low fat diet ID: Zosyn day 5 DVT: SCD only   Plan:  Pt stable from perspective of his cholecystitis.  He will go home with the drain.  He needs a total of 2 weeks of antibiotics.  Ok to switch to PO regimen.  He will follow up with Dr. Maisie Fushomas in 5-6 weeks in our office.  Staff will begin teaching drain care.  Will sign off.  Please call us if any questions or concerns.        LOS: 4 days    Nusaiba Guallpa C. 10/20/2015

## 2015-10-20 NOTE — Progress Notes (Signed)
Patient ID: Rodena MedinSao Garzon, male   DOB: 12/06/1945, 70 y.o.   MRN: [id number]    Referring Physician(s):  Thomas,A  Supervising Physician: Richarda OverlieHenn, Adam  Patient Status:  Inpatient  Chief Complaint:  cholecystitis  Subjective: Pt doing ok; has had occ cough, some mild dyspnea;sl RUQ tenderness; denies N/V; occ loose stool; on NRB mask now   Allergies: Review of patient's allergies indicates no known allergies.  Medications: Prior to Admission medications   Medication Sig Start Date End Date Taking? Authorizing Provider  acetaminophen (TYLENOL) 325 MG tablet Take 2 tablets (650 mg total) by mouth every 6 (six) hours as needed for mild pain (or temp > 100). 07/21/15  Yes Emina Riebock, NP  amLODipine-benazepril (LOTREL) 10-40 MG capsule TAKE ONE CAPSULE BY MOUTH EVERY DAY 07/25/15  Yes Corwin LevinsJames W [name], MD  atenolol (TENORMIN) 50 MG tablet Take 1 tablet (50 mg total) by mouth at bedtime. 02/23/15  Yes Corwin LevinsJames W [name], MD  cetirizine (ZYRTEC) 10 MG tablet Take 1 tablet (10 mg total) by mouth daily. 02/23/15  Yes Corwin LevinsJames W [name], MD  omeprazole (PRILOSEC) 20 MG capsule TAKE ONE CAPSULE BY MOUTH EVERY DAY Patient taking differently: TAKE 20 MG BY MOUTH EVERY DAY 09/25/15  Yes Corwin LevinsJames W [name], MD  pregabalin (LYRICA) 75 MG capsule Take 1 capsule (75 mg total) by mouth 2 (two) times daily. 08/24/15  Yes Corwin LevinsJames W [name], MD  rosuvastatin (CRESTOR) 20 MG tablet Take 1 tablet (20 mg total) by mouth daily. 09/26/14  Yes Corwin LevinsJames W [name], MD  ciprofloxacin (CIPRO) 500 MG tablet Take 1 tablet (500 mg total) by mouth 2 (two) times daily. 10/19/15 11/01/15  Nishant Dhungel, MD  HYDROcodone-acetaminophen (NORCO/VICODIN) 5-325 MG tablet Take 2 tablets by mouth every 6 (six) hours as needed for moderate pain. 10/19/15   Nishant Dhungel, MD     Vital Signs: BP 167/69 mmHg  Pulse 58  Temp(Src) 97.9 F (36.6 C) (Oral)  Resp 18  Ht 5\' 5"  (1.651 m)  Wt 151 lb (68.493 kg)  BMI 25.13 kg/m2  SpO2 95%  Physical Exam awake/alert; RUQ drain  intact, insertion site ok, mildly tender, output 150 cc blood-tinged bile; cx's- clost perfringens; drain irrigated with sterile NS without difficulty  Imaging: Ct Chest W Contrast  10/19/2015  CLINICAL DATA:  History of talus.  Patient presents with hypoxia. EXAM: CT CHEST WITH CONTRAST TECHNIQUE: Multidetector CT imaging of the chest was performed during intravenous contrast administration. CONTRAST:  75mL ISOVUE-300 IOPAMIDOL (ISOVUE-300) INJECTION 61% COMPARISON:  Chest radiograph 10/19/2015 FINDINGS: Mediastinum/Lymph Nodes: Multiple partially calcified post sub pathologic in size mediastinal lymph nodes. No evidence of central pulmonary embolus or thoracic dissection. Calcific atherosclerotic disease of the thoracic aorta and coronary arteries. Lungs/Pleura: There is a small right pleural effusion. Bilateral lower lobe atelectatic changes are seen, worse in the right lower lobe were there is segmental atelectasis. Segmental atelectasis of the lateral segment of the right middle lobe is also seen. There is background of paraseptal upper lobe predominant emphysema. Upper abdomen: Pigtail catheters seen within right upper quadrant focal fluid and gas containing collection measuring 8.1 by 5.0 cm. There are surrounding mesenteric inflammatory changes. Musculoskeletal: No chest wall mass or suspicious bone lesions identified. No other acute abnormalities. IMPRESSION: No evidence of pulmonary embolus. Multiple sub pathologic in size partially calcified mediastinal lymph nodes, usually seen as sequela of prior granulomatous disease. Small right pleural effusion with right lower and middle lobe segmental and subsegmental atelectasis. Milder left lower lobe subsegmental atelectasis. Background  of upper lobe predominant emphysematous changes. Pigtail catheter within complex fluid and gas collection in the right upper quadrant of the abdomen. Electronically Signed   By: Ted Mcalpine M.D.   On: 10/19/2015 19:04    US Abdomen Complete  10/17/2015  CLINICAL DATA:  Cholecystitis EXAM: ABDOMEN ULTRASOUND COMPLETE COMPARISON:  CT scan 10/16/2015 FINDINGS: Gallbladder: There is limited visualization and assessment of the gallbladder due to air artifact from intraluminal air as noted on yesterday CT scan. Acute cholecystitis cannot be excluded. Clinic calculation is necessary. Further correlation with biliary scan could be performed as clinically warranted. Common bile duct: Diameter: 2 mm in diameter within normal limits. Liver: No definite focal hepatic mass. IVC: No abnormality visualized. Pancreas: Visualized portion unremarkable. Spleen: Size and appearance within normal limits.  Measures 5.7 cm. Right Kidney: Length: 9.8 cm. There is normal echogenicity. A cyst is noted in lower pole measures 1.8 x 1.3 cm. Second cyst in lower pole measures 1.1 cm Left Kidney: Length: 11.1 cm. Echogenicity within normal limits. No mass or hydronephrosis visualized. There is a midpole cyst measures 1 x 0.8 cm. Abdominal aorta: No aneurysm visualized. Measures up to 2.8 cm in diameter. Other findings: None. IMPRESSION: 1. Significant limited visualization and assessment of the gallbladder due to intraluminal air as noted on yesterday CT scan. Cholecystitis cannot be excluded. Clinical correlation is necessary. Further evaluation with biliary scan could be performed as clinically warranted. 2. Normal CBD. 3. No hydronephrosis.  Bilateral renal cysts. Electronically Signed   By: Natasha Mead M.D.   On: 10/17/2015 16:36   Ct Abdomen Pelvis W Contrast  10/16/2015  CLINICAL DATA:  Left lower quadrant pain after eating lunch today. EXAM: CT ABDOMEN AND PELVIS WITH CONTRAST TECHNIQUE: Multidetector CT imaging of the abdomen and pelvis was performed using the standard protocol following bolus administration of intravenous contrast. CONTRAST:  ISOVUE-300 IOPAMIDOL (ISOVUE-300) INJECTION 61% COMPARISON:  CT scan dated 07/18/2013 FINDINGS:  Lower chest:  No acute findings. Hepatobiliary: There is air in the gallbladder. No air in the bile ducts. There is slight chronic prominence of the intra and extrahepatic bile ducts which is stable. Slight chronic thickening of the gallbladder wall. No air within the wall of the gallbladder. No liver masses. Pancreas: No mass, inflammatory changes, or other significant abnormality. Spleen: Within normal limits in size and appearance. Adrenals/Urinary Tract: Normal adrenal glands. Three small cysts in the right kidney, unchanged, the largest being 15 mm in the lower pole. 2 tiny cysts in the left kidney. No hydronephrosis. Bladder is normal. Stomach/Bowel: The bowel appears normal including the terminal ileum and appendix. No diverticular disease. Vascular/Lymphatic: Aortic atherosclerosis.  No adenopathy. Reproductive: Normal. Other: No free air or free fluid. Musculoskeletal: Severe atrophy of paraspinal muscles, pelvic muscles, and buttock and proximal thigh muscles. Chronic degenerative disc and joint disease in the lumbar spine. Intra medullary nail and compression screw in the proximal left femur. IMPRESSION: 1. New air in the gallbladder with chronic thickening of the gallbladder wall. This could represent emphysematous cholecystitis. 2. No other significant change since the prior exam. 3. Severe atrophy of the muscles of the pelvis, paraspinal muscles and proximal thigh muscles. Electronically Signed   By: Francene Boyers M.D.   On: 10/16/2015 20:00   Dg Chest Port 1 View  10/20/2015  CLINICAL DATA:  Acute hypoxemic respiratory failure. EXAM: PORTABLE CHEST 1 VIEW COMPARISON:  10/19/2015 FINDINGS: Bibasilar airspace opacities again noted, atelectasis or infiltrates. Heart is borderline in size. Suspect small effusions.  No acute bony abnormality. IMPRESSION: Bibasilar atelectasis or infiltrates.  Suspect small effusions. Electronically Signed   By: Charlett Nose M.D.   On: 10/20/2015 08:53   Dg Chest Port  1 View  10/19/2015  CLINICAL DATA:  Hypoxia.  History of hypertension. EXAM: PORTABLE CHEST 1 VIEW COMPARISON:  02/07/2014 FINDINGS: Heart is enlarged. Patchy infiltrates are identified at the lung bases bilaterally, increased since prior study. Elevation of right hemidiaphragm appears stable. There is increased right paratracheal density compared prior studies. IMPRESSION: 1. Interval development of bibasilar opacities, favoring infectious process or atelectasis. 2. Increased right paratracheal density raising the question of adenopathy, infiltrate, or tortuous great vessels. However, given the interval change, further evaluation is recommended. 3. CT of the chest is recommended. Intravenous contrast is recommended unless it is contraindicated. These results will be called to the ordering clinician or representative by the Radiologist Assistant, and communication documented in the PACS or zVision Dashboard. Electronically Signed   By: Norva Pavlov M.D.   On: 10/19/2015 15:45   Ct Image Guided Drainage By Percutaneous Catheter  10/18/2015  INDICATION: 70 year old male with a history of amyotrophic lateral sclerosis and recurrent acalculous cholecystitis. He is an extremely poor surgical candidate given his diagnosis of ALS. He presents for percutaneous cholecystostomy tube placement. Unfortunately, this could not be performed in the usual fashion with ultrasound guidance given evolution to frank emphysematous cholecystitis which obscures the gallbladder. Therefore, cholecystostomy tube placement will be performed with CT guidance. EXAM: CT GUIDED CHOLECYSTOSTOMY TUBE PLACEMENT MEDICATIONS: Patient is on intravenous Zosyn 3.375 g q.8 hours. No additional antibiotic prophylaxis was given. ANESTHESIA/SEDATION: Moderate (conscious) sedation was employed during this procedure. A total of Versed 2 mg and Fentanyl 100 mcg was administered intravenously. Moderate Sedation Time: 13 minutes. The patient's level of  consciousness and vital signs were monitored continuously by radiology nursing throughout the procedure under my direct supervision. FLUOROSCOPY TIME:  None. COMPLICATIONS: None immediate. Estimated blood loss:  0 PROCEDURE: Informed written consent was obtained from the patient after a thorough discussion of the procedural risks, benefits and alternatives. All questions were addressed. A timeout was performed prior to the initiation of the procedure. A planning axial CT scan was performed. There is persistent colonic interposition. The: Drapes over the gallbladder. There is been evolution to fall minute emphysematous cholecystitis with a significant increase in gallbladder wall thickening and stranding in the pericholecystic fat. A suitable transhepatic window that would avoid the colon was selected. The skin was marked. The region was then sterilely prepped and draped in standard fashion using chlorhexidine skin prep. Local anesthesia was attained by infiltration with 1% lidocaine. A small dermatotomy was made. Under intermittent CT guidance, an 18 gauge trocar needle was advanced along a short transhepatic course into the gallbladder lumen. A 0.035 Amplatz wire was then coiled within the gallbladder lumen in the tract dilated to 10 [place]. A Cook 10 [place] all-purpose drainage catheter was then advanced over the wire and positioned in the gallbladder lumen. There was immediate return of bloody bile. A sample was collected and sent to the lab for culture and sensitivities. The drainage catheter was then secured in place with 0 Prolene suture and an adhesive fixation device. The catheter was connected to gravity bag drainage. The patient was returned to the floor in stable condition. IMPRESSION: 1. Interval evolution to frank emphysematous cholecystitis. 2. Successful placement of a 10 French transhepatic percutaneous cholecystostomy tube using CT guidance. 3. Bile cultures are pending. These results were called by  telephone at the time of interpretation on 10/18/2015 at 8:31 am to Dr. Sherrie GeorgeWILLARD JENNINGS , who verbally acknowledged these results. Signed, Sterling BigHeath K. McCullough, MD [facility] Electronically Signed   By: Malachy MoanHeath  McCullough M.D.   On: 10/18/2015 08:32    Labs:  CBC:  Recent Labs  10/16/15 1808 10/17/15 0222 10/18/15 0837 10/20/15 0549  WBC 12.9* 13.4* 9.6 9.7  HGB 13.9 12.9* 11.1* 12.5*  HCT 41.8 38.1* 33.2* 38.6*  PLT 382 313 175 219    COAGS:  Recent Labs  10/17/15 1233  INR 1.19    BMP:  Recent Labs  10/17/15 0222 10/18/15 0837 10/19/15 0524 10/20/15 0549  NA 138 138 140 141  K 3.5 2.6* 3.9 3.4*  CL 107 111 112* 111  CO2 23 22 20* 24  GLUCOSE 154* 112* 87 108*  BUN 9 11 6 7   CALCIUM 8.4* 7.6* 7.9* 8.0*  CREATININE <0.30* <0.30* 0.31* 0.33*  GFRNONAA NOT CALCULATED NOT CALCULATED >60 >60  GFRAA NOT CALCULATED NOT CALCULATED >60 >60    LIVER FUNCTION TESTS:  Recent Labs  10/16/15 1808 10/17/15 0222 10/18/15 0837 10/20/15 0549  BILITOT 1.2 0.7 1.1 0.4  AST 30 136* 42* 23  ALT 27 85* 53 32  ALKPHOS 116 136* 104 132*  PROT 8.3* 7.7 5.5* 6.2*  ALBUMIN 4.2 3.8 2.5* 2.6*    Assessment and Plan: Pt with hx emphysematous cholecystitis, s/p perc cholecystostomy 6/27; AF; bile cx's - clost perfringens; antbx per pharmacy;  WBC 9.7, hgb 12.5, K 3.4, creat .33, t bili 0.4; CXR today- bibas atx, small effusions- can see sympathetic rt effusion with GB drain; CT chest 6/29- no PE; ATX/ sm effusions; drain in appropriate location; will set up for GB drain injection at IR clinic in 4 weeks; other plans as per IM/CCS   Electronically Signed: D. Jeananne RamaKevin Viggo Perko 10/20/2015, 11:21 AM   I spent a total of 20 minutes at the the patient's bedside AND on the patient's hospital floor or unit, greater than 50% of which was counseling/coordinating care for perc cholecystostomy

## 2015-10-20 NOTE — Progress Notes (Signed)
PROGRESS NOTE                                                                                                                                                                                                             Patient Demographics:    Thomas Raymond, is a 70 y.o. male, DOB - [birth year], WUJ:[id number]  Admit date - 10/16/2015   Admitting Physician Therisa Doyne, MD  Outpatient Primary MD for the patient is Oliver Barre, MD  LOS - 4  Outpatient Specialists: None  Chief Complaint  Patient presents with  . Abdominal Pain       Brief Narrative  70 year old male with history of ALS, hypertension, acalculus cholecystitis presented with abdominal pain. He was admitted with similar symptoms in March 2017, was manages supportive care and antibiotics given chronic history of ALS. CT abdomen showed emphysematous cholecystitis and patient underwent percutaneous cholecystostomy drain placement by IR.   Subjective:   Patient became increasingly hypoxic overnight requiring NRB. CT chest done yesterday rule out for PE and suggested atelectasis.   Assessment  & Plan :   Principal problem Emphysematous acalculus cholecystitis  status post cholecystostomy tube placement by IR on 6/27. Symptoms improved. Continue empiric antibiotics. Tolerating clears, diet advanced to soft. Plan on total  2 weeks of abx on discharge. Recommend to continue drain for at least 4-6 weeks.IR to follow up in 4 weeks. Surgery f/up in 5-6 weeks HHRN and PT upon discharge.    Acute hypoxic respiratory failure Noted for some O2 sat drop and plan for home O2 upon discharge however patient became increasingly hypoxic overnight and required NRB. Chest x-ray and CT chest negative for infiltrate or effusion. No signs of PE. Unable to obtain ABG despite multiple attempts. Repeat chest x-ray this morning again suggests bilateral atelectasis. Ordered IV Lasix  and nebs with improvement. Will transition to nasal cannula. Minimize narcotics. Doubt aspiration. Patient already on empiric Zosyn.  Hypokalemia/hypomagnesemia Replenished  ALS Continue home medications  Essential hypertension Stable. Continue home medications  Right humeral neck fracture Secondary to fall at home. Was seen in the ED on 6/10. Had outpt follow up with Dr Roda Shutters on 6/28 but pt cancelled it since he's here. Ive asked him to reschedule his appt within 1-2 weeks.Has  limited mobility of rt arm.     Code Status : Full  code  Family Communication  : wife at bedside  Disposition Plan  : Home possibly tomorrow if resp status stable and CT chest non concerning.  Barriers For Discharge : hypoxia  Consults  :   [facility] IR  Procedures  : CT-guided percutaneous cholecystostomy drain placement   DVT Prophylaxis  :  Lovenox -   Lab Results  Component Value Date   PLT 219 10/20/2015    Antibiotics  :    Anti-infectives    Start     Dose/Rate Route Frequency Ordered Stop   10/19/15 0000  ciprofloxacin (CIPRO) 500 MG tablet     500 mg Oral 2 times daily 10/19/15 1133 11/01/15 2359   10/17/15 0600  piperacillin-tazobactam (ZOSYN) IVPB 3.375 g     3.375 g 12.5 mL/hr over 240 Minutes Intravenous Every 8 hours 10/16/15 2335     10/16/15 2115  piperacillin-tazobactam (ZOSYN) IVPB 3.375 g     3.375 g 12.5 mL/hr over 240 Minutes Intravenous  Once 10/16/15 2103 10/16/15 2128        Objective:   Filed Vitals:   10/19/15 1816 10/19/15 2200 10/20/15 0548 10/20/15 0831  BP: 152/94 158/66 167/69   Pulse: 81 63 58   Temp: 98.3 F (36.8 C) 98.2 F (36.8 C) 97.9 F (36.6 C)   TempSrc: Oral Oral Oral   Resp: 20 18 18    Height:      Weight:      SpO2: 89% 90% 91% 95%    Wt Readings from Last 3 Encounters:  10/17/15 68.493 kg (151 lb)  08/24/15 68.04 kg (150 lb)  07/19/15 72.576 kg (160 lb)     Intake/Output Summary (Last 24 hours) at 10/20/15 1354 Last data filed  at 10/20/15 1150  Gross per 24 hour  Intake   1160 ml  Output    725 ml  Net    435 ml     Physical Exam  Gen: On NRB HEENT:  moist mucosa, supple neck Chest: Diminished bilateral breath sounds CVS: N S1&S2, no murmurs,  GI: soft,  ND, BS+, Cholecystostomy drain in place, non tender Musculoskeletal: warm, no edema, limited rt arm mobility.     Data Review:    CBC  Recent Labs Lab 10/16/15 1808 10/17/15 0222 10/18/15 0837 10/20/15 0549  WBC 12.9* 13.4* 9.6 9.7  HGB 13.9 12.9* 11.1* 12.5*  HCT 41.8 38.1* 33.2* 38.6*  PLT 382 313 175 219  MCV 87.6 85.8 86.5 88.1  MCH 29.1 29.1 28.9 28.5  MCHC 33.3 33.9 33.4 32.4  RDW 13.8 13.7 14.5 14.9  LYMPHSABS 2.4  --   --   --   MONOABS 0.7  --   --   --   EOSABS 0.1  --   --   --   BASOSABS 0.0  --   --   --     Chemistries   Recent Labs Lab 10/16/15 1808 10/17/15 0222 10/18/15 0837 10/19/15 0524 10/20/15 0549  NA 137 138 138 140 141  K 3.8 3.5 2.6* 3.9 3.4*  CL 103 107 111 112* 111  CO2 24 23 22  20* 24  GLUCOSE 126* 154* 112* 87 108*  BUN 12 9 11 6 7   CREATININE 0.32* <0.30* <0.30* 0.31* 0.33*  CALCIUM 8.9 8.4* 7.6* 7.9* 8.0*  MG  --  1.9 1.7  --   --   AST 30 136* 42*  --  23  ALT 27 85* 53  --  32  ALKPHOS 116 136* 104  --  132*  BILITOT 1.2 0.7 1.1  --  0.4   ------------------------------------------------------------------------------------------------------------------ No results for input(s): CHOL, HDL, LDLCALC, TRIG, CHOLHDL, LDLDIRECT in the last 72 hours.  Lab Results  Component Value Date   HGBA1C 6.2 02/23/2015   ------------------------------------------------------------------------------------------------------------------ No results for input(s): TSH, T4TOTAL, T3FREE, THYROIDAB in the last 72 hours.  Invalid input(s): FREET3 ------------------------------------------------------------------------------------------------------------------ No results for input(s): VITAMINB12, FOLATE,  FERRITIN, TIBC, IRON, RETICCTPCT in the last 72 hours.  Coagulation profile  Recent Labs Lab 10/17/15 1233  INR 1.19    No results for input(s): DDIMER in the last 72 hours.  Cardiac Enzymes No results for input(s): CKMB, TROPONINI, MYOGLOBIN in the last 168 hours.  Invalid input(s): CK ------------------------------------------------------------------------------------------------------------------ No results found for: BNP  Inpatient Medications  Scheduled Meds: . atenolol  50 mg Oral QHS  . ipratropium-albuterol  3 mL Nebulization Q6H  . piperacillin-tazobactam (ZOSYN)  IV  3.375 g Intravenous Q8H  . pregabalin  75 mg Oral BID  . rosuvastatin  20 mg Oral Daily   Continuous Infusions:   PRN Meds:.acetaminophen **OR** acetaminophen, hydrALAZINE, HYDROmorphone (DILAUDID) injection, midazolam, ondansetron **OR** ondansetron (ZOFRAN) IV  Micro Results Recent Results (from the past 240 hour(s))  Surgical PCR screen     Status: None   Collection Time: 10/16/15 11:58 PM  Result Value Ref Range Status   MRSA, PCR NEGATIVE NEGATIVE Final   Staphylococcus aureus NEGATIVE NEGATIVE Final    Comment:        The Xpert SA Assay (FDA approved for NASAL specimens in patients over 71 years of age), is one component of a comprehensive surveillance program.  Test performance has been validated by [facility] for patients greater than or equal to 71 year old. It is not intended to diagnose infection nor to guide or monitor treatment.   Aerobic/Anaerobic Culture (surgical/deep wound)     Status: None   Collection Time: 10/17/15  6:16 PM  Result Value Ref Range Status   Specimen Description BILE  Final   Special Requests Normal  Final   Gram Stain   Final    ABUNDANT WBC PRESENT, PREDOMINANTLY PMN ABUNDANT GRAM VARIABLE ROD Performed at [facility]    Culture MODERATE CLOSTRIDIUM PERFRINGENS  Final   Report Status 10/20/2015 FINAL  Final    Radiology Reports Dg  Shoulder Right  09/30/2015  CLINICAL DATA:  70 year old male status post fall with pain in the right shoulder EXAM: RIGHT SHOULDER - 2+ VIEW COMPARISON:  None. FINDINGS: There is an acute displaced fracture through the anatomic neck of the humerus. There is surrounding soft tissue swelling and likely hematoma. The humeral head remains located with respect to the glenoid. The visualized thorax is unremarkable. IMPRESSION: Acute displaced fracture through the anatomic neck of the humerus. Electronically Signed   By: Malachy MoanHeath  McCullough M.D.   On: 09/30/2015 13:05   Ct Chest W Contrast  10/19/2015  CLINICAL DATA:  History of talus.  Patient presents with hypoxia. EXAM: CT CHEST WITH CONTRAST TECHNIQUE: Multidetector CT imaging of the chest was performed during intravenous contrast administration. CONTRAST:  75mL ISOVUE-300 IOPAMIDOL (ISOVUE-300) INJECTION 61% COMPARISON:  Chest radiograph 10/19/2015 FINDINGS: Mediastinum/Lymph Nodes: Multiple partially calcified post sub pathologic in size mediastinal lymph nodes. No evidence of central pulmonary embolus or thoracic dissection. Calcific atherosclerotic disease of the thoracic aorta and coronary arteries. Lungs/Pleura: There is a small right pleural effusion. Bilateral lower lobe atelectatic changes are seen, worse in the right lower lobe were there is segmental atelectasis. Segmental atelectasis  of the lateral segment of the right middle lobe is also seen. There is background of paraseptal upper lobe predominant emphysema. Upper abdomen: Pigtail catheters seen within right upper quadrant focal fluid and gas containing collection measuring 8.1 by 5.0 cm. There are surrounding mesenteric inflammatory changes. Musculoskeletal: No chest wall mass or suspicious bone lesions identified. No other acute abnormalities. IMPRESSION: No evidence of pulmonary embolus. Multiple sub pathologic in size partially calcified mediastinal lymph nodes, usually seen as sequela of prior  granulomatous disease. Small right pleural effusion with right lower and middle lobe segmental and subsegmental atelectasis. Milder left lower lobe subsegmental atelectasis. Background of upper lobe predominant emphysematous changes. Pigtail catheter within complex fluid and gas collection in the right upper quadrant of the abdomen. Electronically Signed   By: Ted Mcalpineobrinka  Dimitrova M.D.   On: 10/19/2015 19:04   [place] Abdomen Complete  10/17/2015  CLINICAL DATA:  Cholecystitis EXAM: ABDOMEN ULTRASOUND COMPLETE COMPARISON:  CT scan 10/16/2015 FINDINGS: Gallbladder: There is limited visualization and assessment of the gallbladder due to air artifact from intraluminal air as noted on yesterday CT scan. Acute cholecystitis cannot be excluded. Clinic calculation is necessary. Further correlation with biliary scan could be performed as clinically warranted. Common bile duct: Diameter: 2 mm in diameter within normal limits. Liver: No definite focal hepatic mass. IVC: No abnormality visualized. Pancreas: Visualized portion unremarkable. Spleen: Size and appearance within normal limits.  Measures 5.7 cm. Right Kidney: Length: 9.8 cm. There is normal echogenicity. A cyst is noted in lower pole measures 1.8 x 1.3 cm. Second cyst in lower pole measures 1.1 cm Left Kidney: Length: 11.1 cm. Echogenicity within normal limits. No mass or hydronephrosis visualized. There is a midpole cyst measures 1 x 0.8 cm. Abdominal aorta: No aneurysm visualized. Measures up to 2.8 cm in diameter. Other findings: None. IMPRESSION: 1. Significant limited visualization and assessment of the gallbladder due to intraluminal air as noted on yesterday CT scan. Cholecystitis cannot be excluded. Clinical correlation is necessary. Further evaluation with biliary scan could be performed as clinically warranted. 2. Normal CBD. 3. No hydronephrosis.  Bilateral renal cysts. Electronically Signed   By: Natasha MeadLiviu  Pop M.D.   On: 10/17/2015 16:36   Ct Abdomen Pelvis  W Contrast  10/16/2015  CLINICAL DATA:  Left lower quadrant pain after eating lunch today. EXAM: CT ABDOMEN AND PELVIS WITH CONTRAST TECHNIQUE: Multidetector CT imaging of the abdomen and pelvis was performed using the standard protocol following bolus administration of intravenous contrast. CONTRAST:  100mL ISOVUE-300 IOPAMIDOL (ISOVUE-300) INJECTION 61% COMPARISON:  CT scan dated 07/18/2013 FINDINGS: Lower chest:  No acute findings. Hepatobiliary: There is air in the gallbladder. No air in the bile ducts. There is slight chronic prominence of the intra and extrahepatic bile ducts which is stable. Slight chronic thickening of the gallbladder wall. No air within the wall of the gallbladder. No liver masses. Pancreas: No mass, inflammatory changes, or other significant abnormality. Spleen: Within normal limits in size and appearance. Adrenals/Urinary Tract: Normal adrenal glands. Three small cysts in the right kidney, unchanged, the largest being 15 mm in the lower pole. 2 tiny cysts in the left kidney. No hydronephrosis. Bladder is normal. Stomach/Bowel: The bowel appears normal including the terminal ileum and appendix. No diverticular disease. Vascular/Lymphatic: Aortic atherosclerosis.  No adenopathy. Reproductive: Normal. Other: No free air or free fluid. Musculoskeletal: Severe atrophy of paraspinal muscles, pelvic muscles, and buttock and proximal thigh muscles. Chronic degenerative disc and joint disease in the lumbar spine. Intra medullary nail and  compression screw in the proximal left femur. IMPRESSION: 1. New air in the gallbladder with chronic thickening of the gallbladder wall. This could represent emphysematous cholecystitis. 2. No other significant change since the prior exam. 3. Severe atrophy of the muscles of the pelvis, paraspinal muscles and proximal thigh muscles. Electronically Signed   By: Francene Boyers M.D.   On: 10/16/2015 20:00   Dg Chest Port 1 View  10/20/2015  CLINICAL DATA:  Acute  hypoxemic respiratory failure. EXAM: PORTABLE CHEST 1 VIEW COMPARISON:  10/19/2015 FINDINGS: Bibasilar airspace opacities again noted, atelectasis or infiltrates. Heart is borderline in size. Suspect small effusions. No acute bony abnormality. IMPRESSION: Bibasilar atelectasis or infiltrates.  Suspect small effusions. Electronically Signed   By: Charlett Nose M.D.   On: 10/20/2015 08:53   Dg Chest Port 1 View  10/19/2015  CLINICAL DATA:  Hypoxia.  History of hypertension. EXAM: PORTABLE CHEST 1 VIEW COMPARISON:  02/07/2014 FINDINGS: Heart is enlarged. Patchy infiltrates are identified at the lung bases bilaterally, increased since prior study. Elevation of right hemidiaphragm appears stable. There is increased right paratracheal density compared prior studies. IMPRESSION: 1. Interval development of bibasilar opacities, favoring infectious process or atelectasis. 2. Increased right paratracheal density raising the question of adenopathy, infiltrate, or tortuous great vessels. However, given the interval change, further evaluation is recommended. 3. CT of the chest is recommended. Intravenous contrast is recommended unless it is contraindicated. These results will be called to the ordering clinician or representative by the Radiologist Assistant, and communication documented in the PACS or zVision Dashboard. Electronically Signed   By: Norva Pavlov M.D.   On: 10/19/2015 15:45   Ct Image Guided Drainage By Percutaneous Catheter  10/18/2015  INDICATION: 70 year old male with a history of amyotrophic lateral sclerosis and recurrent acalculous cholecystitis. He is an extremely poor surgical candidate given his diagnosis of ALS. He presents for percutaneous cholecystostomy tube placement. Unfortunately, this could not be performed in the usual fashion with ultrasound guidance given evolution to frank emphysematous cholecystitis which obscures the gallbladder. Therefore, cholecystostomy tube placement will be  performed with CT guidance. EXAM: CT GUIDED CHOLECYSTOSTOMY TUBE PLACEMENT MEDICATIONS: Patient is on intravenous Zosyn 3.375 g q.8 hours. No additional antibiotic prophylaxis was given. ANESTHESIA/SEDATION: Moderate (conscious) sedation was employed during this procedure. A total of Versed 2 mg and Fentanyl 100 mcg was administered intravenously. Moderate Sedation Time: 13 minutes. The patient's level of consciousness and vital signs were monitored continuously by radiology nursing throughout the procedure under my direct supervision. FLUOROSCOPY TIME:  None. COMPLICATIONS: None immediate. Estimated blood loss:  0 PROCEDURE: Informed written consent was obtained from the patient after a thorough discussion of the procedural risks, benefits and alternatives. All questions were addressed. A timeout was performed prior to the initiation of the procedure. A planning axial CT scan was performed. There is persistent colonic interposition. The: Drapes over the gallbladder. There is been evolution to fall minute emphysematous cholecystitis with a significant increase in gallbladder wall thickening and stranding in the pericholecystic fat. A suitable transhepatic window that would avoid the colon was selected. The skin was marked. The region was then sterilely prepped and draped in standard fashion using chlorhexidine skin prep. Local anesthesia was attained by infiltration with 1% lidocaine. A small dermatotomy was made. Under intermittent CT guidance, an 18 gauge trocar needle was advanced along a short transhepatic course into the gallbladder lumen. A 0.035 Amplatz wire was then coiled within the gallbladder lumen in the tract dilated to 10 [place]. A  Cook 10 [place] all-purpose drainage catheter was then advanced over the wire and positioned in the gallbladder lumen. There was immediate return of bloody bile. A sample was collected and sent to the lab for culture and sensitivities. The drainage catheter was then secured  in place with 0 Prolene suture and an adhesive fixation device. The catheter was connected to gravity bag drainage. The patient was returned to the floor in stable condition. IMPRESSION: 1. Interval evolution to frank emphysematous cholecystitis. 2. Successful placement of a 10 French transhepatic percutaneous cholecystostomy tube using CT guidance. 3. Bile cultures are pending. These results were called by telephone at the time of interpretation on 10/18/2015 at 8:31 am to Dr. Sherrie George , who verbally acknowledged these results. Signed, Sterling Big, MD [facility] Radiology Electronically Signed   By: Malachy Moan M.D.   On: 10/18/2015 08:32    Time Spent in minutes  25   Eddie North M.D on 10/20/2015 at 1:54 PM  Between 7am to 7pm - Pager - [phone]  After 7pm go to [email or web address] - password [facility]  [facility] -  Office  [phone]

## 2015-10-20 NOTE — Progress Notes (Addendum)
Pt c/o of sob is currently on 3L [place] 02 dropped down in the low 80's, gasping for air. Paged Respiratory to the room, placed  the Pt on a non rebreather mask. He seems to  be responding well 02 in the low 90's, continues pulse ox  in use. Will continue to monitor.

## 2015-10-20 NOTE — Progress Notes (Signed)
Pharmacy Antibiotic Note  Thomas MedinSao Raymond is a 70 y.o. male  with ALS and is wheelchair bound admitted on 10/16/2015 with c/o abdominal pain. CT abdomen showed emphysematous cholecystitis -- s/p  percutaneous cholecystostomy drain placement by IR on 6/27 with plan to keep drain in for at least 4-6weeks. Patient's currently on zosyn day #4 with plan to treat for 2 weeks.  [facility]S has signed off but recommend abx can be changed oral at discharge.  - 6/30 CXR: atelectasis or infiltrates - afeb, wbc down wnl, scr low (ALS, wheelchair bound)  Plan: - continue zosyn 3.375 gm IV q8h (infuse over 4 hours) - pharmacy will sign off as current dose is appropriate for indication and renal function - re-consult us if need further assistance  ___________________________  Height: 5\' 5"  (165.1 cm) Weight: 151 lb (68.493 kg) IBW/kg (Calculated) : 61.5  Temp (24hrs), Avg:98.2 F (36.8 C), Min:97.9 F (36.6 C), Max:98.4 F (36.9 C)   Recent Labs Lab 10/16/15 1808 10/16/15 1819 10/16/15 2333 10/17/15 0222 10/18/15 0837 10/19/15 0524 10/20/15 0549  WBC 12.9*  --   --  13.4* 9.6  --  9.7  CREATININE 0.32*  --   --  <0.30* <0.30* 0.31* 0.33*  LATICACIDVEN  --  2.63* 1.8 1.6  --   --   --     Estimated Creatinine Clearance: 74.7 mL/min (by C-G formula based on Cr of 0.33).    No Known Allergies  Antimicrobials this admission: Zosyn 6/26 >>  Microbiology results: 6/26 MRSA PCR (-) 6/27 bile cx: abundant gram variable rod,moderate clostridium perfringens FINAL Thank you for allowing pharmacy to be a part of this patient's care.  Lucia Gaskinsham, Sybel Standish P 10/20/2015 9:57 AM

## 2015-10-20 NOTE — Progress Notes (Signed)
Pt still continues to stat in the low 90's, tired weaning   the pt on 5l [place] 02 in the low 80's. Pt placed back on non re breather  02 low 90's will continue to monitor. I will report off to Ester RN day shift RN.

## 2015-10-21 DIAGNOSIS — S42211D Unspecified displaced fracture of surgical neck of right humerus, subsequent encounter for fracture with routine healing: Secondary | ICD-10-CM

## 2015-10-21 DIAGNOSIS — J9811 Atelectasis: Secondary | ICD-10-CM | POA: Insufficient documentation

## 2015-10-21 DIAGNOSIS — J9601 Acute respiratory failure with hypoxia: Secondary | ICD-10-CM | POA: Diagnosis not present

## 2015-10-21 DIAGNOSIS — I10 Essential (primary) hypertension: Secondary | ICD-10-CM

## 2015-10-21 MED ORDER — CIPROFLOXACIN HCL 500 MG PO TABS: 500.0000 mg | ORAL_TABLET | Freq: Two times a day (BID) | ORAL | Status: AC

## 2015-10-21 MED ORDER — ALBUTEROL SULFATE HFA 108 (90 BASE) MCG/ACT IN AERS: 2.0000 | INHALATION_SPRAY | Freq: Four times a day (QID) | RESPIRATORY_TRACT | Status: DC | PRN

## 2015-10-21 MED ORDER — HYDROCODONE-ACETAMINOPHEN 5-325 MG PO TABS: 2.0000 | ORAL_TABLET | Freq: Four times a day (QID) | ORAL | Status: DC | PRN

## 2015-10-21 NOTE — Progress Notes (Addendum)
13:58 Cm has received call from RN, Tanya cancelling home O2 for 1508 as pul monary saturation reveals pt doe snot fit criteria for insurance to cove home O2 (see pulmonary sat note).  Cm called [facility] to make aware.  [facility]  No other CM needs were communicated.  CM received call from RN requesting home O2; CM has requested pulmonary saturation note.  CM called [facility] rep, tiffany to please authorize for home O2 once pulmonary sat note has been placed by RN.  CM has called [facility] DME rep, Germaine to please deliver [facility] tank [facility]  [facility] set up for HHPt/OT/RN.  NO other CM needs were communicated.

## 2015-10-21 NOTE — Discharge Instructions (Signed)
Cholecystostomy, Care After °Refer to this sheet in the next few weeks. These instructions provide you with information about caring for yourself after your procedure. Your health care provider may also give you more specific instructions. Your treatment has been planned according to current medical practices, but problems sometimes occur. Call your health care provider if you have any problems or questions after your procedure. °WHAT TO EXPECT AFTER THE PROCEDURE °After your procedure, it is common to have soreness near the site of your drainage tube (catheter) or your incision. °HOME CARE INSTRUCTIONS °Incision Care °· Follow instructions from your health care provider about how to take care of your incision. Make sure you: °¨ Wash your hands with soap and water before you change your bandage (dressing). If soap and water are not available, use hand sanitizer. °¨ Change your dressing as told by your health care provider. °¨ Leave stitches (sutures), skin glue, or adhesive strips in place. These skin closures may need to be in place for 2 weeks or longer. If adhesive strip edges start to loosen and curl up, you may trim the loose edges. Do not remove adhesive strips completely unless your health care provider tells you to do that. °· Check your incision and your drainage site every day for signs of infection. Watch for: °¨ Redness, swelling, or pain. °¨ Fluid, blood, or pus. ° General Instructions °· If you were sent home with a surgical drain in place, follow instructions from your health care provider about how to care for your drain and collection bag at home. °· Do not take baths, swim, or use a hot tub until your health care provider approves. Ask your health care provider if you can take showers. You may only be allowed to take sponge baths for bathing. °· Follow instructions from your health care provider about what you may eat or drink. °· Take over-the-counter and prescription medicines only as told by  your health care provider. °· Keep all follow-up visits as told by your health care provider. This is important. °SEEK MEDICAL CARE IF: °· You have redness, swelling, or pain at your incision or drainage site. °· You have nausea or vomiting. °SEEK IMMEDIATE MEDICAL CARE IF: °· Your abdominal pain gets worse. °· You feel dizzy or you faint while standing. °· You have fluid, blood, or pus coming from your incision or drainage site. °· You have a fever. °· You have shortness of breath. °· You have a rapid heartbeat. °· Your nausea or vomiting does not go away. °· Your drainage tube becomes blocked. °· Your drainage tube comes out of your abdomen. °  °This information is not intended to replace advice given to you by your health care provider. Make sure you discuss any questions you have with your health care provider. °  °Document Released: 12/28/2014 Document Reviewed: 07/20/2014 °Elsevier Interactive Patient Education ©2016 [employer] ° °

## 2015-10-21 NOTE — ED Provider Notes (Signed)
CSN: [id number]     Arrival date & time 10/16/15  1734 History   First MD Initiated Contact with Patient 10/16/15 1751     Chief Complaint  Patient presents with  . Abdominal Pain     (Consider location/radiation/quality/duration/timing/severity/associated sxs/prior Treatment) HPI Patient reports severe pain that has developed in his epigastrium and left abdomen. It started after eating lunch. Pain is cramping and intense. No recent fever, vomiting or diarrhea. Patient does have a history of ALS. He has poor baseline mobility. Patient had hospital admission at the end of March for a calculus cholecystitis. Past Medical History  Diagnosis Date  . ALLERGIC RHINITIS   . ALS   . HYPERLIPIDEMIA   . HYPERTENSION   . VERTIGO   . LOW BACK PAIN   . Impaired glucose tolerance 02/17/2011   Past Surgical History  Procedure Laterality Date  . Lipoma removal  2003  . Tracheostomy    . Percutaneous gastrostomy tube repalcement      04/1999 and removed 07/1999  . Compression hip screw Left 02/07/2014    Procedure: IM Nail;  Surgeon: Harvie JuniorJohn L Graves, MD;  Location: [facility];  Service: Orthopedics;  Laterality: Left;   Family History  Problem Relation Age of Onset  . Other Mother     Deceased  . Other Father     Deceased  . Other Son 2    x 2 starvation/illness from [place]  . Other Daughter 2    x 2 starvation/illness from [place]   Social History  Substance Use Topics  . Smoking status: Never Smoker   . Smokeless tobacco: Never Used  . Alcohol Use: No    Review of Systems 10 Systems reviewed and are negative for acute change except as noted in the HPI.    Allergies  Review of patient's allergies indicates no known allergies.  Home Medications   Prior to Admission medications   Medication Sig Start Date End Date Taking? Authorizing Provider  acetaminophen (TYLENOL) 325 MG tablet Take 2 tablets (650 mg total) by mouth every 6 (six) hours as needed for mild pain (or temp  > 100). 07/21/15  Yes Emina Riebock, NP  amLODipine-benazepril (LOTREL) 10-40 MG capsule TAKE ONE CAPSULE BY MOUTH EVERY DAY 07/25/15  Yes Corwin LevinsJames W [name], MD  atenolol (TENORMIN) 50 MG tablet Take 1 tablet (50 mg total) by mouth at bedtime. 02/23/15  Yes Corwin LevinsJames W [name], MD  cetirizine (ZYRTEC) 10 MG tablet Take 1 tablet (10 mg total) by mouth daily. 02/23/15  Yes Corwin LevinsJames W [name], MD  omeprazole (PRILOSEC) 20 MG capsule TAKE ONE CAPSULE BY MOUTH EVERY DAY Patient taking differently: TAKE 20 MG BY MOUTH EVERY DAY 09/25/15  Yes Corwin LevinsJames W [name], MD  pregabalin (LYRICA) 75 MG capsule Take 1 capsule (75 mg total) by mouth 2 (two) times daily. 08/24/15  Yes Corwin LevinsJames W [name], MD  rosuvastatin (CRESTOR) 20 MG tablet Take 1 tablet (20 mg total) by mouth daily. 09/26/14  Yes Corwin LevinsJames W [name], MD  ciprofloxacin (CIPRO) 500 MG tablet Take 1 tablet (500 mg total) by mouth 2 (two) times daily. 10/19/15 11/01/15  Nishant Dhungel, MD  HYDROcodone-acetaminophen (NORCO/VICODIN) 5-325 MG tablet Take 2 tablets by mouth every 6 (six) hours as needed for moderate pain. 10/19/15   Nishant Dhungel, MD   BP 146/63 mmHg  Pulse 42  Temp(Src) 98 F (36.7 C) (Oral)  Resp 18  Ht 5\' 5"  (1.651 m)  Wt 151 lb (68.493 kg)  BMI 25.13 kg/m2  SpO2 97% Physical Exam  Constitutional: He is oriented to person, place, and time.  Patient is alert and nontoxic. He does appear to be in severe pain, holding his upper abdomen. No respiratory distress at rest. Patient does have ALS but his general physical condition is fair.  HENT:  Head: Normocephalic and atraumatic.  Poor dentition. Mucous membranes moist.  Eyes: EOM are normal. No scleral icterus.  Cardiovascular: Normal rate, regular rhythm, normal heart sounds and intact distal pulses.   Pulmonary/Chest: Effort normal and breath sounds normal.  Abdominal: Soft. Bowel sounds are normal.  Severe pain to palpation mid abdomen to epigastrium and left upper quadrant. No guarding. Lower abdomen soft no significant  tenderness.  Musculoskeletal: He exhibits no edema or tenderness.  Patient is essentially nonambulatory however lower extremities are in fair condition. There is no significant peripheral edema and no large degree of atrophy. Skin condition good.  Neurological: He is alert and oriented to person, place, and time.  Patient is alert and oriented. His speech is clear. He is somewhat difficult to understand due to language barrier but content is situationally normal. Patient has use of his upper extremities and can reposition himself in the stretcher. He is not physically immobilized  Skin: Skin is warm and dry.    ED Course  Procedures (including critical care time) Labs Review Labs Reviewed  COMPREHENSIVE METABOLIC PANEL - Abnormal; Notable for the following:    Glucose, Bld 126 (*)    Creatinine, Ser 0.32 (*)    Total Protein 8.3 (*)    All other components within normal limits  CBC WITH DIFFERENTIAL/PLATELET - Abnormal; Notable for the following:    WBC 12.9 (*)    Neutro Abs 9.6 (*)    All other components within normal limits  URINALYSIS, ROUTINE W REFLEX MICROSCOPIC (NOT [facility]) - Abnormal; Notable for the following:    Specific Gravity, Urine 1.042 (*)    Protein, ur 100 (*)    All other components within normal limits  COMPREHENSIVE METABOLIC PANEL - Abnormal; Notable for the following:    Glucose, Bld 154 (*)    Creatinine, Ser <0.30 (*)    Calcium 8.4 (*)    AST 136 (*)    ALT 85 (*)    Alkaline Phosphatase 136 (*)    All other components within normal limits  CBC - Abnormal; Notable for the following:    WBC 13.4 (*)    Hemoglobin 12.9 (*)    HCT 38.1 (*)    All other components within normal limits  CBC - Abnormal; Notable for the following:    RBC 3.84 (*)    Hemoglobin 11.1 (*)    HCT 33.2 (*)    All other components within normal limits  COMPREHENSIVE METABOLIC PANEL - Abnormal; Notable for the following:    Potassium 2.6 (*)    Glucose, Bld 112 (*)     Creatinine, Ser <0.30 (*)    Calcium 7.6 (*)    Total Protein 5.5 (*)    Albumin 2.5 (*)    AST 42 (*)    All other components within normal limits  BASIC METABOLIC PANEL - Abnormal; Notable for the following:    Chloride 112 (*)    CO2 20 (*)    Creatinine, Ser 0.31 (*)    Calcium 7.9 (*)    All other components within normal limits  CBC - Abnormal; Notable for the following:    Hemoglobin 12.5 (*)    HCT 38.6 (*)  All other components within normal limits  COMPREHENSIVE METABOLIC PANEL - Abnormal; Notable for the following:    Potassium 3.4 (*)    Glucose, Bld 108 (*)    Creatinine, Ser 0.33 (*)    Calcium 8.0 (*)    Total Protein 6.2 (*)    Albumin 2.6 (*)    Alkaline Phosphatase 132 (*)    All other components within normal limits  I-STAT CG4 LACTIC ACID, ED - Abnormal; Notable for the following:    Lactic Acid, Venous 2.63 (*)    All other components within normal limits  SURGICAL PCR SCREEN  AEROBIC/ANAEROBIC CULTURE (SURGICAL/DEEP WOUND)  LIPASE, BLOOD  URINE MICROSCOPIC-ADD ON  LACTIC ACID, PLASMA  LACTIC ACID, PLASMA  MAGNESIUM  PHOSPHORUS  TSH  PROTIME-INR  MAGNESIUM    Imaging Review Ct Chest W Contrast  10/19/2015  CLINICAL DATA:  History of talus.  Patient presents with hypoxia. EXAM: CT CHEST WITH CONTRAST TECHNIQUE: Multidetector CT imaging of the chest was performed during intravenous contrast administration. CONTRAST:  75mL ISOVUE-300 IOPAMIDOL (ISOVUE-300) INJECTION 61% COMPARISON:  Chest radiograph 10/19/2015 FINDINGS: Mediastinum/Lymph Nodes: Multiple partially calcified post sub pathologic in size mediastinal lymph nodes. No evidence of central pulmonary embolus or thoracic dissection. Calcific atherosclerotic disease of the thoracic aorta and coronary arteries. Lungs/Pleura: There is a small right pleural effusion. Bilateral lower lobe atelectatic changes are seen, worse in the right lower lobe were there is segmental atelectasis. Segmental  atelectasis of the lateral segment of the right middle lobe is also seen. There is background of paraseptal upper lobe predominant emphysema. Upper abdomen: Pigtail catheters seen within right upper quadrant focal fluid and gas containing collection measuring 8.1 by 5.0 cm. There are surrounding mesenteric inflammatory changes. Musculoskeletal: No chest wall mass or suspicious bone lesions identified. No other acute abnormalities. IMPRESSION: No evidence of pulmonary embolus. Multiple sub pathologic in size partially calcified mediastinal lymph nodes, usually seen as sequela of prior granulomatous disease. Small right pleural effusion with right lower and middle lobe segmental and subsegmental atelectasis. Milder left lower lobe subsegmental atelectasis. Background of upper lobe predominant emphysematous changes. Pigtail catheter within complex fluid and gas collection in the right upper quadrant of the abdomen. Electronically Signed   By: Ted Mcalpine M.D.   On: 10/19/2015 19:04   Dg Chest Port 1 View  10/20/2015  CLINICAL DATA:  Acute hypoxemic respiratory failure. EXAM: PORTABLE CHEST 1 VIEW COMPARISON:  10/19/2015 FINDINGS: Bibasilar airspace opacities again noted, atelectasis or infiltrates. Heart is borderline in size. Suspect small effusions. No acute bony abnormality. IMPRESSION: Bibasilar atelectasis or infiltrates.  Suspect small effusions. Electronically Signed   By: Charlett Nose M.D.   On: 10/20/2015 08:53   Dg Chest Port 1 View  10/19/2015  CLINICAL DATA:  Hypoxia.  History of hypertension. EXAM: PORTABLE CHEST 1 VIEW COMPARISON:  02/07/2014 FINDINGS: Heart is enlarged. Patchy infiltrates are identified at the lung bases bilaterally, increased since prior study. Elevation of right hemidiaphragm appears stable. There is increased right paratracheal density compared prior studies. IMPRESSION: 1. Interval development of bibasilar opacities, favoring infectious process or atelectasis. 2.  Increased right paratracheal density raising the question of adenopathy, infiltrate, or tortuous great vessels. However, given the interval change, further evaluation is recommended. 3. CT of the chest is recommended. Intravenous contrast is recommended unless it is contraindicated. These results will be called to the ordering clinician or representative by the Radiologist Assistant, and communication documented in the PACS or zVision Dashboard. Electronically Signed   By:  Norva Pavlov M.D.   On: 10/19/2015 15:45   I have personally reviewed and evaluated these images and lab results as part of my medical decision-making.   EKG Interpretation None     Consult: Reviewed with Dr. Gerrit Friends. Will treat for suspected cholecystitis and admitted to medical service. MDM   Final diagnoses:  Cholecystitis  Acute cholecystitis without calculus         Patient arrives alert and oriented. He is nontoxic. Airways intact. He presents with severe pain. Patient has known acalculous cholecystitis history. He has leukocytosis. Have consult it with general surgery. Will treat the patient with antibiotics, pain control and admitted to medical service. Surgery will see in consult.    Arby Barrette, MD 10/21/15 1009

## 2015-10-21 NOTE — Progress Notes (Signed)
Patient's VSS, oxygen saturation levels WNL on room air, patient denies pain.  Chole tube flushed without difficulty, dressing changed.  Discharge instructions reviewed with patient and spouse, prescriptions given to patient.  Patient discharged home with home health to follow up.

## 2015-10-21 NOTE — Progress Notes (Signed)
SATURATION QUALIFICATIONS: (This note is used to comply with regulatory documentation for home oxygen)  Patient Saturations on Room Air at Rest = 97%  Patient Saturations on 2 Liters of oxygen at Rest = 100%  Please briefly explain why patient needs home oxygen: Patient is non-ambulatory

## 2015-10-21 NOTE — Discharge Summary (Signed)
Physician Discharge Summary  McCalla ZOX:[id number] DOB: [birth year] DOA: 10/16/2015  PCP: Oliver Barre, MD  Admit date: 10/16/2015 Discharge date: 10/21/2015  Admitted From:Home Disposition:  Home with home health  Recommendations for Outpatient Follow-up:  #1 Follow-up with PCP in one week. Patient will be discharged on oral ciprofloxacin for 10 more days to complete 2 weeks course of antibiotics. (Stop date 11/01/2015).  Patient is being discharged home on home O2 via nasal cannula (2 L) #2  Patient to follow-up with IR in 4 weeks to set her for GB drain injection. #3  Follow-up with surgery Dr. Maisie Fus in 5-6 weeks. #4 follow-up with orthopedics Dr Roda Shutters in 1-2 weeks   Home Health: RN and PT Equipment/Devices: Home O2 (2 L via nasal cannula continuously  Discharge Condition: Stable CODE STATUS: Full code Diet recommendation: Regular  Discharge Diagnoses:  Principal Problem:   Acute emphysematous cholecystitis   Active Problems:   ALS   Essential hypertension   Cholecystitis   Hypokalemia   Hypomagnesemia   Fracture, humerus, neck   Acute respiratory failure with hypoxia (HCC)   Atelectasis of both lungs  Brief narrative/history of present illness: 70 year old male with history of ALS, hypertension, acalculus cholecystitis presented with abdominal pain. He was admitted with similar symptoms in March 2017, was manages supportive care and antibiotics given chronic history of ALS. CT abdomen showed emphysematous cholecystitis and patient underwent percutaneous cholecystostomy drain placement by IR. Patient was planned to be discharged home on 6/29 but became hypoxic acutely requiring NRB.  Hospital course  Principal problem Emphysematous acalculus cholecystitis  status post cholecystostomy tube placement by IR on 6/27. Symptoms improved. Received empiric Zosyn. Tolerated advanced diet. Plan on total 2 weeks of abx on discharge. (Completes on 7/12) Recommend to continue drain for  at least 4-6 weeks.IR to follow up in 4 weeks. Surgery f/up in 5-6 weeks HHRN and PT upon discharge.    Acute hypoxic respiratory failure Noted for some O2 sat drop and plan for home O2 upon discharge however patient became increasingly hypoxic overnight and required NRB. Chest x-ray and CT chest negative for infiltrate or effusion. No signs of PE. Unable to obtain ABG despite multiple attempts. Repeat chest x-ray this morning again suggests bilateral atelectasis.  Improved with a dose of IV Lasix and DuoNeb's. Now maintaining O2 sat on 2 L. We provide flutter valve upon discharge and albuterol inhaler as needed.  Hypokalemia/hypomagnesemia Replenished  ALS Continue home medications  Essential hypertension Stable. Continue home medications  Right humeral neck fracture Secondary to fall at home. Was seen in the ED on 6/10. Had outpt follow up with Dr Roda Shutters on 6/28 but pt cancelled it since he's here. Ive asked him to reschedule his appt within 1-2 weeks.Has limited mobility of rt arm. Will prescribe some pain medications upon discharge.      Family Communication : wife at bedside   Consults :  [facility] IR  Procedures : CT-guided percutaneous cholecystostomy drain placement       Discharge Instructions     Medication List    TAKE these medications        acetaminophen 325 MG tablet  Commonly known as:  TYLENOL  Take 2 tablets (650 mg total) by mouth every 6 (six) hours as needed for mild pain (or temp > 100).     albuterol 108 (90 Base) MCG/ACT inhaler  Commonly known as:  PROVENTIL HFA;VENTOLIN HFA  Inhale 2 puffs into the lungs every 6 (six) hours as needed for  wheezing or shortness of breath.     amLODipine-benazepril 10-40 MG capsule  Commonly known as:  LOTREL  TAKE ONE CAPSULE BY MOUTH EVERY DAY     atenolol 50 MG tablet  Commonly known as:  TENORMIN  Take 1 tablet (50 mg total) by mouth at bedtime.     cetirizine 10 MG tablet  Commonly known as:   ZYRTEC  Take 1 tablet (10 mg total) by mouth daily.     ciprofloxacin 500 MG tablet  Commonly known as:  CIPRO  Take 1 tablet (500 mg total) by mouth 2 (two) times daily.     HYDROcodone-acetaminophen 5-325 MG tablet  Commonly known as:  NORCO/VICODIN  Take 2 tablets by mouth every 6 (six) hours as needed for moderate pain.     omeprazole 20 MG capsule  Commonly known as:  PRILOSEC  TAKE ONE CAPSULE BY MOUTH EVERY DAY     pregabalin 75 MG capsule  Commonly known as:  LYRICA  Take 1 capsule (75 mg total) by mouth 2 (two) times daily.     rosuvastatin 20 MG tablet  Commonly known as:  CRESTOR  Take 1 tablet (20 mg total) by mouth daily.           Follow-up Information    Follow up with Oliver Barre, MD. Schedule an appointment as soon as possible for a visit in 2 weeks.   Specialties:  Internal Medicine, Radiology   Contact information:   [address] Maggie Schwalbe [facility] [place] [address] [phone]       Follow up with Vanita Panda., MD. Schedule an appointment as soon as possible for a visit in 5 weeks.   Specialty:  General Surgery   Contact information:   [address] [place] [address] [phone]       Follow up with D. Jeananne Rama, PA-C. Call in 4 weeks.   Specialty:  Radiology   Contact information:   [address] [place] [address] [phone]       Follow up with Cheral Almas, MD. Call in 1 week.   Specialty:  Orthopedic Surgery   Contact information:   [address] [place] [address] [phone]      No Known Allergies      Procedures/Studies: Dg Shoulder Right  09/30/2015  CLINICAL DATA:  70 year old male status post fall with pain in the right shoulder EXAM: RIGHT SHOULDER - 2+ VIEW COMPARISON:  None. FINDINGS: There is an acute displaced fracture through the anatomic neck of the humerus. There is surrounding soft tissue swelling and likely hematoma. The humeral head remains located with  respect to the glenoid. The visualized thorax is unremarkable. IMPRESSION: Acute displaced fracture through the anatomic neck of the humerus. Electronically Signed   By: Malachy Moan M.D.   On: 09/30/2015 13:05   Ct Chest W Contrast  10/19/2015  CLINICAL DATA:  History of talus.  Patient presents with hypoxia. EXAM: CT CHEST WITH CONTRAST TECHNIQUE: Multidetector CT imaging of the chest was performed during intravenous contrast administration. CONTRAST:  75mL ISOVUE-300 IOPAMIDOL (ISOVUE-300) INJECTION 61% COMPARISON:  Chest radiograph 10/19/2015 FINDINGS: Mediastinum/Lymph Nodes: Multiple partially calcified post sub pathologic in size mediastinal lymph nodes. No evidence of central pulmonary embolus or thoracic dissection. Calcific atherosclerotic disease of the thoracic aorta and coronary arteries. Lungs/Pleura: There is a small right pleural effusion. Bilateral lower lobe atelectatic changes are seen, worse in the right lower lobe were there  is segmental atelectasis. Segmental atelectasis of the lateral segment of the right middle lobe is also seen. There is background of paraseptal upper lobe predominant emphysema. Upper abdomen: Pigtail catheters seen within right upper quadrant focal fluid and gas containing collection measuring 8.1 by 5.0 cm. There are surrounding mesenteric inflammatory changes. Musculoskeletal: No chest wall mass or suspicious bone lesions identified. No other acute abnormalities. IMPRESSION: No evidence of pulmonary embolus. Multiple sub pathologic in size partially calcified mediastinal lymph nodes, usually seen as sequela of prior granulomatous disease. Small right pleural effusion with right lower and middle lobe segmental and subsegmental atelectasis. Milder left lower lobe subsegmental atelectasis. Background of upper lobe predominant emphysematous changes. Pigtail catheter within complex fluid and gas collection in the right upper quadrant of the abdomen. Electronically  Signed   By: Ted Mcalpine M.D.   On: 10/19/2015 19:04   US Abdomen Complete  10/17/2015  CLINICAL DATA:  Cholecystitis EXAM: ABDOMEN ULTRASOUND COMPLETE COMPARISON:  CT scan 10/16/2015 FINDINGS: Gallbladder: There is limited visualization and assessment of the gallbladder due to air artifact from intraluminal air as noted on yesterday CT scan. Acute cholecystitis cannot be excluded. Clinic calculation is necessary. Further correlation with biliary scan could be performed as clinically warranted. Common bile duct: Diameter: 2 mm in diameter within normal limits. Liver: No definite focal hepatic mass. IVC: No abnormality visualized. Pancreas: Visualized portion unremarkable. Spleen: Size and appearance within normal limits.  Measures 5.7 cm. Right Kidney: Length: 9.8 cm. There is normal echogenicity. A cyst is noted in lower pole measures 1.8 x 1.3 cm. Second cyst in lower pole measures 1.1 cm Left Kidney: Length: 11.1 cm. Echogenicity within normal limits. No mass or hydronephrosis visualized. There is a midpole cyst measures 1 x 0.8 cm. Abdominal aorta: No aneurysm visualized. Measures up to 2.8 cm in diameter. Other findings: None. IMPRESSION: 1. Significant limited visualization and assessment of the gallbladder due to intraluminal air as noted on yesterday CT scan. Cholecystitis cannot be excluded. Clinical correlation is necessary. Further evaluation with biliary scan could be performed as clinically warranted. 2. Normal CBD. 3. No hydronephrosis.  Bilateral renal cysts. Electronically Signed   By: Natasha Mead M.D.   On: 10/17/2015 16:36   Ct Abdomen Pelvis W Contrast  10/16/2015  CLINICAL DATA:  Left lower quadrant pain after eating lunch today. EXAM: CT ABDOMEN AND PELVIS WITH CONTRAST TECHNIQUE: Multidetector CT imaging of the abdomen and pelvis was performed using the standard protocol following bolus administration of intravenous contrast. CONTRAST:  ISOVUE-300 IOPAMIDOL (ISOVUE-300)  INJECTION 61% COMPARISON:  CT scan dated 07/18/2013 FINDINGS: Lower chest:  No acute findings. Hepatobiliary: There is air in the gallbladder. No air in the bile ducts. There is slight chronic prominence of the intra and extrahepatic bile ducts which is stable. Slight chronic thickening of the gallbladder wall. No air within the wall of the gallbladder. No liver masses. Pancreas: No mass, inflammatory changes, or other significant abnormality. Spleen: Within normal limits in size and appearance. Adrenals/Urinary Tract: Normal adrenal glands. Three small cysts in the right kidney, unchanged, the largest being 15 mm in the lower pole. 2 tiny cysts in the left kidney. No hydronephrosis. Bladder is normal. Stomach/Bowel: The bowel appears normal including the terminal ileum and appendix. No diverticular disease. Vascular/Lymphatic: Aortic atherosclerosis.  No adenopathy. Reproductive: Normal. Other: No free air or free fluid. Musculoskeletal: Severe atrophy of paraspinal muscles, pelvic muscles, and buttock and proximal thigh muscles. Chronic degenerative disc and joint disease in the lumbar  spine. Intra medullary nail and compression screw in the proximal left femur. IMPRESSION: 1. New air in the gallbladder with chronic thickening of the gallbladder wall. This could represent emphysematous cholecystitis. 2. No other significant change since the prior exam. 3. Severe atrophy of the muscles of the pelvis, paraspinal muscles and proximal thigh muscles. Electronically Signed   By: Francene Boyers M.D.   On: 10/16/2015 20:00   Dg Chest Port 1 View  10/20/2015  CLINICAL DATA:  Acute hypoxemic respiratory failure. EXAM: PORTABLE CHEST 1 VIEW COMPARISON:  10/19/2015 FINDINGS: Bibasilar airspace opacities again noted, atelectasis or infiltrates. Heart is borderline in size. Suspect small effusions. No acute bony abnormality. IMPRESSION: Bibasilar atelectasis or infiltrates.  Suspect small effusions. Electronically Signed    By: Charlett Nose M.D.   On: 10/20/2015 08:53   Dg Chest Port 1 View  10/19/2015  CLINICAL DATA:  Hypoxia.  History of hypertension. EXAM: PORTABLE CHEST 1 VIEW COMPARISON:  02/07/2014 FINDINGS: Heart is enlarged. Patchy infiltrates are identified at the lung bases bilaterally, increased since prior study. Elevation of right hemidiaphragm appears stable. There is increased right paratracheal density compared prior studies. IMPRESSION: 1. Interval development of bibasilar opacities, favoring infectious process or atelectasis. 2. Increased right paratracheal density raising the question of adenopathy, infiltrate, or tortuous great vessels. However, given the interval change, further evaluation is recommended. 3. CT of the chest is recommended. Intravenous contrast is recommended unless it is contraindicated. These results will be called to the ordering clinician or representative by the Radiologist Assistant, and communication documented in the PACS or zVision Dashboard. Electronically Signed   By: Norva Pavlov M.D.   On: 10/19/2015 15:45   Ct Image Guided Drainage By Percutaneous Catheter  10/18/2015  INDICATION: 70 year old male with a history of amyotrophic lateral sclerosis and recurrent acalculous cholecystitis. He is an extremely poor surgical candidate given his diagnosis of ALS. He presents for percutaneous cholecystostomy tube placement. Unfortunately, this could not be performed in the usual fashion with ultrasound guidance given evolution to frank emphysematous cholecystitis which obscures the gallbladder. Therefore, cholecystostomy tube placement will be performed with CT guidance. EXAM: CT GUIDED CHOLECYSTOSTOMY TUBE PLACEMENT MEDICATIONS: Patient is on intravenous Zosyn 3.375 g q.8 hours. No additional antibiotic prophylaxis was given. ANESTHESIA/SEDATION: Moderate (conscious) sedation was employed during this procedure. A total of Versed 2 mg and Fentanyl 100 mcg was administered intravenously.  Moderate Sedation Time: 13 minutes. The patient's level of consciousness and vital signs were monitored continuously by radiology nursing throughout the procedure under my direct supervision. FLUOROSCOPY TIME:  None. COMPLICATIONS: None immediate. Estimated blood loss:  0 PROCEDURE: Informed written consent was obtained from the patient after a thorough discussion of the procedural risks, benefits and alternatives. All questions were addressed. A timeout was performed prior to the initiation of the procedure. A planning axial CT scan was performed. There is persistent colonic interposition. The: Drapes over the gallbladder. There is been evolution to fall minute emphysematous cholecystitis with a significant increase in gallbladder wall thickening and stranding in the pericholecystic fat. A suitable transhepatic window that would avoid the colon was selected. The skin was marked. The region was then sterilely prepped and draped in standard fashion using chlorhexidine skin prep. Local anesthesia was attained by infiltration with 1% lidocaine. A small dermatotomy was made. Under intermittent CT guidance, an 18 gauge trocar needle was advanced along a short transhepatic course into the gallbladder lumen. A 0.035 Amplatz wire was then coiled within the gallbladder lumen in the tract  dilated to 10 [place]. A Cook 10 [place]French all-purpose drainage catheter was then advanced over the wire and positioned in the gallbladder lumen. There was immediate return of bloody bile. A sample was collected and sent to the lab for culture and sensitivities. The drainage catheter was then secured in place with 0 Prolene suture and an adhesive fixation device. The catheter was connected to gravity bag drainage. The patient was returned to the floor in stable condition. IMPRESSION: 1. Interval evolution to frank emphysematous cholecystitis. 2. Successful placement of a 10 French transhepatic percutaneous cholecystostomy tube using CT guidance.  3. Bile cultures are pending. These results were called by telephone at the time of interpretation on 10/18/2015 at 8:31 am to Dr. Sherrie GeorgeWILLARD JENNINGS , who verbally acknowledged these results. Signed, Sterling BigHeath K. McCullough, MD [facility] Electronically Signed   By: Malachy MoanHeath  McCullough M.D.   On: 10/18/2015 08:32     Subjective: Stable on 2 L O2 via nasal cannula overnight. No new issues.  Discharge Exam: Filed Vitals:   10/20/15 2048 10/21/15 0515  BP: 140/50 146/63  Pulse: 53 42  Temp: 97.9 F (36.6 C) 98 F (36.7 C)  Resp: 18 18   Filed Vitals:   10/20/15 1536 10/20/15 2026 10/20/15 2048 10/21/15 0515  BP: 131/65  140/50 146/63  Pulse: 64  53 42  Temp: 97.6 F (36.4 C)  97.9 F (36.6 C) 98 F (36.7 C)  TempSrc: Oral  Oral Oral  Resp: 18  18 18   Height:      Weight:      SpO2: 97% 93% 98% 97%    Gen: Elderly male not in distress HEENT: moist mucosa, supple neck Chest: Improved breath sounds bilaterally CVS: N S1&S2, no murmurs,  GI: soft, ND, BS+, Cholecystostomy drain in place, non tender Musculoskeletal: warm, no edema, limited rt arm mobility. CNS: Alert and oriented     The results of significant diagnostics from this hospitalization (including imaging, microbiology, ancillary and laboratory) are listed below for reference.     Microbiology: Recent Results (from the past 240 hour(s))  Surgical PCR screen     Status: None   Collection Time: 10/16/15 11:58 PM  Result Value Ref Range Status   MRSA, PCR NEGATIVE NEGATIVE Final   Staphylococcus aureus NEGATIVE NEGATIVE Final    Comment:        The Xpert SA Assay (FDA approved for NASAL specimens in patients over 71 years of age), is one component of a comprehensive surveillance program.  Test performance has been validated by [facility] for patients greater than or equal to 71 year old. It is not intended to diagnose infection nor to guide or  monitor treatment.   Aerobic/Anaerobic Culture (surgical/deep wound)     Status: None   Collection Time: 10/17/15  6:16 PM  Result Value Ref Range Status   Specimen Description BILE  Final   Special Requests Normal  Final   Gram Stain   Final    ABUNDANT WBC PRESENT, PREDOMINANTLY PMN ABUNDANT GRAM VARIABLE ROD Performed at [facility]    Culture MODERATE CLOSTRIDIUM PERFRINGENS  Final   Report Status 10/20/2015 FINAL  Final     Labs: BNP (last 3 results) No results for input(s): BNP in the last 8760 hours. Basic Metabolic Panel:  Recent Labs Lab 10/16/15 1808 10/17/15 0222 10/18/15 0837 10/19/15 0524 10/20/15 0549  NA 137 138 138 140 141  K 3.8 3.5 2.6* 3.9 3.4*  CL 103  107 111 112* 111  CO2 24 23 22  20* 24  GLUCOSE 126* 154* 112* 87 108*  BUN 12 9 11 6 7   CREATININE 0.32* <0.30* <0.30* 0.31* 0.33*  CALCIUM 8.9 8.4* 7.6* 7.9* 8.0*  MG  --  1.9 1.7  --   --   PHOS  --  3.3  --   --   --    Liver Function Tests:  Recent Labs Lab 10/16/15 1808 10/17/15 0222 10/18/15 0837 10/20/15 0549  AST 30 136* 42* 23  ALT 27 85* 53 32  ALKPHOS 116 136* 104 132*  BILITOT 1.2 0.7 1.1 0.4  PROT 8.3* 7.7 5.5* 6.2*  ALBUMIN 4.2 3.8 2.5* 2.6*    Recent Labs Lab 10/16/15 1808  LIPASE 27   No results for input(s): AMMONIA in the last 168 hours. CBC:  Recent Labs Lab 10/16/15 1808 10/17/15 0222 10/18/15 0837 10/20/15 0549  WBC 12.9* 13.4* 9.6 9.7  NEUTROABS 9.6*  --   --   --   HGB 13.9 12.9* 11.1* 12.5*  HCT 41.8 38.1* 33.2* 38.6*  MCV 87.6 85.8 86.5 88.1  PLT 382 313 175 219   Cardiac Enzymes: No results for input(s): CKTOTAL, CKMB, CKMBINDEX, TROPONINI in the last 168 hours. BNP: Invalid input(s): POCBNP CBG: No results for input(s): GLUCAP in the last 168 hours. D-Dimer No results for input(s): DDIMER in the last 72 hours. Hgb A1c No results for input(s): HGBA1C in the last 72 hours. Lipid Profile No results for input(s): CHOL, HDL, LDLCALC,  TRIG, CHOLHDL, LDLDIRECT in the last 72 hours. Thyroid function studies No results for input(s): TSH, T4TOTAL, T3FREE, THYROIDAB in the last 72 hours.  Invalid input(s): FREET3 Anemia work up No results for input(s): VITAMINB12, FOLATE, FERRITIN, TIBC, IRON, RETICCTPCT in the last 72 hours. Urinalysis    Component Value Date/Time   COLORURINE YELLOW 10/16/2015 2030   APPEARANCEUR CLEAR 10/16/2015 2030   LABSPEC 1.042* 10/16/2015 2030   PHURINE 6.0 10/16/2015 2030   GLUCOSEU NEGATIVE 10/16/2015 2030   GLUCOSEU NEGATIVE 02/23/2015 1507   HGBUR NEGATIVE 10/16/2015 2030   BILIRUBINUR NEGATIVE 10/16/2015 2030   KETONESUR NEGATIVE 10/16/2015 2030   PROTEINUR 100* 10/16/2015 2030   UROBILINOGEN 0.2 02/23/2015 1507   NITRITE NEGATIVE 10/16/2015 2030   LEUKOCYTESUR NEGATIVE 10/16/2015 2030   Sepsis Labs Invalid input(s): PROCALCITONIN,  WBC,  LACTICIDVEN Microbiology Recent Results (from the past 240 hour(s))  Surgical PCR screen     Status: None   Collection Time: 10/16/15 11:58 PM  Result Value Ref Range Status   MRSA, PCR NEGATIVE NEGATIVE Final   Staphylococcus aureus NEGATIVE NEGATIVE Final    Comment:        The Xpert SA Assay (FDA approved for NASAL specimens in patients over 71 years of age), is one component of a comprehensive surveillance program.  Test performance has been validated by [facility] for patients greater than or equal to 371 year old. It is not intended to diagnose infection nor to guide or monitor treatment.   Aerobic/Anaerobic Culture (surgical/deep wound)     Status: None   Collection Time: 10/17/15  6:16 PM  Result Value Ref Range Status   Specimen Description BILE  Final   Special Requests Normal  Final   Gram Stain   Final    ABUNDANT WBC PRESENT, PREDOMINANTLY PMN ABUNDANT GRAM VARIABLE ROD Performed at [facility]    Culture MODERATE CLOSTRIDIUM PERFRINGENS  Final   Report Status 10/20/2015 FINAL  Final  Time coordinating  discharge: Over 30 minutes  SIGNED:   Eddie North, MD  [facility] 10/21/2015, 11:54 AM Pager   If 7PM-7AM, please contact night-coverage [email or web address] Password [email or web address]

## 2015-10-22 ENCOUNTER — Other Ambulatory Visit: Payer: Self-pay | Admitting: Physician Assistant

## 2015-10-22 DIAGNOSIS — K810 Acute cholecystitis: Secondary | ICD-10-CM

## 2015-10-23 ENCOUNTER — Other Ambulatory Visit: Payer: Self-pay | Admitting: General Surgery

## 2015-10-23 DIAGNOSIS — K810 Acute cholecystitis: Secondary | ICD-10-CM

## 2015-10-25 ENCOUNTER — Other Ambulatory Visit: Payer: Self-pay

## 2015-10-25 MED ORDER — CETIRIZINE HCL 10 MG PO TABS: 10.0000 mg | ORAL_TABLET | Freq: Every day | ORAL | Status: DC

## 2015-10-25 MED ORDER — ROSUVASTATIN CALCIUM 20 MG PO TABS: 20.0000 mg | ORAL_TABLET | Freq: Every day | ORAL | Status: DC

## 2015-10-26 DIAGNOSIS — Z7982 Long term (current) use of aspirin: Secondary | ICD-10-CM | POA: Diagnosis not present

## 2015-10-26 DIAGNOSIS — I10 Essential (primary) hypertension: Secondary | ICD-10-CM | POA: Diagnosis not present

## 2015-10-26 DIAGNOSIS — E785 Hyperlipidemia, unspecified: Secondary | ICD-10-CM | POA: Diagnosis not present

## 2015-10-26 DIAGNOSIS — K810 Acute cholecystitis: Secondary | ICD-10-CM | POA: Diagnosis not present

## 2015-10-26 DIAGNOSIS — G1221 Amyotrophic lateral sclerosis: Secondary | ICD-10-CM | POA: Diagnosis not present

## 2015-10-27 DIAGNOSIS — K810 Acute cholecystitis: Secondary | ICD-10-CM | POA: Diagnosis not present

## 2015-10-27 DIAGNOSIS — Z7982 Long term (current) use of aspirin: Secondary | ICD-10-CM | POA: Diagnosis not present

## 2015-10-27 DIAGNOSIS — E785 Hyperlipidemia, unspecified: Secondary | ICD-10-CM | POA: Diagnosis not present

## 2015-10-27 DIAGNOSIS — I10 Essential (primary) hypertension: Secondary | ICD-10-CM | POA: Diagnosis not present

## 2015-10-27 DIAGNOSIS — G1221 Amyotrophic lateral sclerosis: Secondary | ICD-10-CM | POA: Diagnosis not present

## 2015-10-30 ENCOUNTER — Telehealth: Payer: Self-pay

## 2015-10-30 DIAGNOSIS — E785 Hyperlipidemia, unspecified: Secondary | ICD-10-CM | POA: Diagnosis not present

## 2015-10-30 DIAGNOSIS — Z7982 Long term (current) use of aspirin: Secondary | ICD-10-CM | POA: Diagnosis not present

## 2015-10-30 DIAGNOSIS — I10 Essential (primary) hypertension: Secondary | ICD-10-CM | POA: Diagnosis not present

## 2015-10-30 DIAGNOSIS — K810 Acute cholecystitis: Secondary | ICD-10-CM | POA: Diagnosis not present

## 2015-10-30 DIAGNOSIS — S42211A Unspecified displaced fracture of surgical neck of right humerus, initial encounter for closed fracture: Secondary | ICD-10-CM | POA: Diagnosis not present

## 2015-10-30 DIAGNOSIS — G1221 Amyotrophic lateral sclerosis: Secondary | ICD-10-CM | POA: Diagnosis not present

## 2015-10-30 NOTE — Telephone Encounter (Signed)
Verbal Orders Patient just got out of hospital and Home health wanted orders for  PT 2x a week for 4Week to help with tranfers And a order for a Child psychotherapistocial worker   Enrique SackKendra [phone]

## 2015-10-31 NOTE — Telephone Encounter (Signed)
Verbals given  

## 2015-10-31 NOTE — Telephone Encounter (Signed)
Ok for verbal 

## 2015-11-01 DIAGNOSIS — E785 Hyperlipidemia, unspecified: Secondary | ICD-10-CM | POA: Diagnosis not present

## 2015-11-01 DIAGNOSIS — G1221 Amyotrophic lateral sclerosis: Secondary | ICD-10-CM | POA: Diagnosis not present

## 2015-11-01 DIAGNOSIS — Z7982 Long term (current) use of aspirin: Secondary | ICD-10-CM | POA: Diagnosis not present

## 2015-11-01 DIAGNOSIS — I10 Essential (primary) hypertension: Secondary | ICD-10-CM | POA: Diagnosis not present

## 2015-11-01 DIAGNOSIS — K810 Acute cholecystitis: Secondary | ICD-10-CM | POA: Diagnosis not present

## 2015-11-02 DIAGNOSIS — K810 Acute cholecystitis: Secondary | ICD-10-CM | POA: Diagnosis not present

## 2015-11-02 DIAGNOSIS — G1221 Amyotrophic lateral sclerosis: Secondary | ICD-10-CM | POA: Diagnosis not present

## 2015-11-02 DIAGNOSIS — Z7982 Long term (current) use of aspirin: Secondary | ICD-10-CM | POA: Diagnosis not present

## 2015-11-02 DIAGNOSIS — I10 Essential (primary) hypertension: Secondary | ICD-10-CM | POA: Diagnosis not present

## 2015-11-02 DIAGNOSIS — E785 Hyperlipidemia, unspecified: Secondary | ICD-10-CM | POA: Diagnosis not present

## 2015-11-04 DIAGNOSIS — K810 Acute cholecystitis: Secondary | ICD-10-CM | POA: Diagnosis not present

## 2015-11-04 DIAGNOSIS — I10 Essential (primary) hypertension: Secondary | ICD-10-CM | POA: Diagnosis not present

## 2015-11-04 DIAGNOSIS — Z7982 Long term (current) use of aspirin: Secondary | ICD-10-CM | POA: Diagnosis not present

## 2015-11-04 DIAGNOSIS — G1221 Amyotrophic lateral sclerosis: Secondary | ICD-10-CM | POA: Diagnosis not present

## 2015-11-04 DIAGNOSIS — E785 Hyperlipidemia, unspecified: Secondary | ICD-10-CM | POA: Diagnosis not present

## 2015-11-07 ENCOUNTER — Encounter: Payer: Self-pay | Admitting: Internal Medicine

## 2015-11-07 ENCOUNTER — Ambulatory Visit (INDEPENDENT_AMBULATORY_CARE_PROVIDER_SITE_OTHER): Payer: Medicare Other | Admitting: Internal Medicine

## 2015-11-07 DIAGNOSIS — J9601 Acute respiratory failure with hypoxia: Secondary | ICD-10-CM | POA: Diagnosis not present

## 2015-11-07 DIAGNOSIS — I10 Essential (primary) hypertension: Secondary | ICD-10-CM

## 2015-11-07 DIAGNOSIS — Z7982 Long term (current) use of aspirin: Secondary | ICD-10-CM | POA: Diagnosis not present

## 2015-11-07 DIAGNOSIS — K810 Acute cholecystitis: Secondary | ICD-10-CM | POA: Diagnosis not present

## 2015-11-07 DIAGNOSIS — R7302 Impaired glucose tolerance (oral): Secondary | ICD-10-CM | POA: Diagnosis not present

## 2015-11-07 DIAGNOSIS — G1221 Amyotrophic lateral sclerosis: Secondary | ICD-10-CM | POA: Diagnosis not present

## 2015-11-07 DIAGNOSIS — E785 Hyperlipidemia, unspecified: Secondary | ICD-10-CM | POA: Diagnosis not present

## 2015-11-07 NOTE — Progress Notes (Signed)
Subjective:    Patient ID: Thomas Raymond, male    DOB: 12/15/1945, 70 y.o.   MRN: [id number]  HPI  Here to f/u after hospn for acute cholecystitis s/p percutaneous drain, course complicated by acute hypoxic resp failure, d/c on 2L [place].  For drain to be removed at about 6 wks. Pt denies chest pain, increased sob or doe, wheezing, orthopnea, PND, increased LE swelling, palpitations, dizziness or syncope.  Pt denies new neurological symptoms such as new headache, or facial or extremity weakness or numbness   Pt denies polydipsia, polyuria.  FInished cipro course Past Medical History:  Diagnosis Date  . ALLERGIC RHINITIS   . ALS   . HYPERLIPIDEMIA   . HYPERTENSION   . Impaired glucose tolerance 02/17/2011  . LOW BACK PAIN   . VERTIGO    Past Surgical History:  Procedure Laterality Date  . COMPRESSION HIP SCREW Left 02/07/2014   Procedure: IM Nail;  Surgeon: Harvie JuniorJohn L Graves, MD;  Location: [facility];  Service: Orthopedics;  Laterality: Left;  . Lipoma removal  2003  . Percutaneous gastrostomy tube repalcement     04/1999 and removed 07/1999  . TRACHEOSTOMY      reports that he has never smoked. He has never used smokeless tobacco. He reports that he does not drink alcohol or use drugs. family history includes Other in his father and mother; Other (age of onset: 2) in his daughter and son. No Known Allergies Current Outpatient Prescriptions on File Prior to Visit  Medication Sig Dispense Refill  . acetaminophen (TYLENOL) 325 MG tablet Take 2 tablets (650 mg total) by mouth every 6 (six) hours as needed for mild pain (or temp > 100).    [name]. albuterol (PROVENTIL HFA;VENTOLIN HFA) 108 (90 Base) MCG/ACT inhaler Inhale 2 puffs into the lungs every 6 (six) hours as needed for wheezing or shortness of breath. 1 Inhaler 2  . amLODipine-benazepril (LOTREL) 10-40 MG capsule TAKE ONE CAPSULE BY MOUTH EVERY DAY 90 capsule 2  . atenolol (TENORMIN) 50 MG tablet Take 1 tablet (50 mg total) by mouth at bedtime. 90  tablet 1  . cetirizine (ZYRTEC) 10 MG tablet Take 1 tablet (10 mg total) by mouth daily. 90 tablet 1  . HYDROcodone-acetaminophen (NORCO/VICODIN) 5-325 MG tablet Take 2 tablets by mouth every 6 (six) hours as needed for moderate pain. 15 tablet 0  . omeprazole (PRILOSEC) 20 MG capsule TAKE ONE CAPSULE BY MOUTH EVERY DAY (Patient taking differently: TAKE 20 MG BY MOUTH EVERY DAY) 90 capsule 1  . pregabalin (LYRICA) 75 MG capsule Take 1 capsule (75 mg total) by mouth 2 (two) times daily. 60 capsule 5  . rosuvastatin (CRESTOR) 20 MG tablet Take 1 tablet (20 mg total) by mouth daily. 90 tablet 2   No current facility-administered medications on file prior to visit.    Review of Systems  Constitutional: Negative for unusual diaphoresis or night sweats HENT: Negative for ear swelling or discharge Eyes: Negative for worsening visual haziness  Respiratory: Negative for choking and stridor.   Gastrointestinal: Negative for distension or worsening eructation Genitourinary: Negative for retention or change in urine volume.  Musculoskeletal: Negative for other MSK pain or swelling Skin: Negative for color change and worsening wound Neurological: Negative for tremors and numbness other than noted  Psychiatric/Behavioral: Negative for decreased concentration or agitation other than above       Objective:   Physical Exam BP 128/82   Pulse 61   Temp 98 F (36.7 C) (Oral)  Resp 20   Wt 150 lb (68 kg)   SpO2 96%   BMI 24.96 kg/m   VS noted,  Constitutional: Pt appears in no apparent distress HENT: Head: NCAT.  Right Ear: External ear normal.  Left Ear: External ear normal.  Eyes: . Pupils are equal, round, and reactive to light. Conjunctivae and EOM are normal Neck: Normal range of motion. Neck supple.  Cardiovascular: Normal rate and regular rhythm.   Pulmonary/Chest: Effort normal and breath sounds decreased without rales or wheezing.  Abd:  Soft,  ND, + BS, mild tender RUQ Neurological:  Pt is alert. Not confused , motor grossly intact Skin: Skin is warm. No rash, no LE edema Psychiatric: Pt behavior is normal. No agitation.     Assessment & Plan:

## 2015-11-07 NOTE — Patient Instructions (Signed)
Please continue all other medications as before, and refills have been done if requested.  Please have the pharmacy call with any other refills you may need.  Please continue your efforts at being more active, low cholesterol diet, and weight control.  You are otherwise up to date with prevention measures today.  Please keep your appointments with your specialists as you may have planned  Please return in 6 months, or sooner if needed, unless you already have an appt

## 2015-11-07 NOTE — Progress Notes (Signed)
Pre visit review using our clinic review tool, if applicable. No additional management support is needed unless otherwise documented below in the visit note. 

## 2015-11-08 DIAGNOSIS — Z7982 Long term (current) use of aspirin: Secondary | ICD-10-CM | POA: Diagnosis not present

## 2015-11-08 DIAGNOSIS — G1221 Amyotrophic lateral sclerosis: Secondary | ICD-10-CM | POA: Diagnosis not present

## 2015-11-08 DIAGNOSIS — K810 Acute cholecystitis: Secondary | ICD-10-CM | POA: Diagnosis not present

## 2015-11-08 DIAGNOSIS — E785 Hyperlipidemia, unspecified: Secondary | ICD-10-CM | POA: Diagnosis not present

## 2015-11-08 DIAGNOSIS — I10 Essential (primary) hypertension: Secondary | ICD-10-CM | POA: Diagnosis not present

## 2015-11-12 NOTE — Assessment & Plan Note (Signed)
Stable, for drain removal fu as planned

## 2015-11-12 NOTE — Assessment & Plan Note (Signed)
stable overall by history and exam, recent data reviewed with pt, and pt to continue medical treatment as before,  to f/u any worsening symptoms or concerns BP Readings from Last 3 Encounters:  11/07/15 128/82  10/21/15 (!) 146/63  09/30/15 144/97

## 2015-11-12 NOTE — Assessment & Plan Note (Signed)
Stable, cont 2L [place]

## 2015-11-12 NOTE — Assessment & Plan Note (Signed)
stable overall by history and exam, recent data reviewed with pt, and pt to continue medical treatment as before,  to f/u any worsening symptoms or concerns Lab Results  Component Value Date   HGBA1C 6.2 02/23/2015

## 2015-11-13 DIAGNOSIS — E785 Hyperlipidemia, unspecified: Secondary | ICD-10-CM | POA: Diagnosis not present

## 2015-11-13 DIAGNOSIS — K810 Acute cholecystitis: Secondary | ICD-10-CM | POA: Diagnosis not present

## 2015-11-13 DIAGNOSIS — G1221 Amyotrophic lateral sclerosis: Secondary | ICD-10-CM | POA: Diagnosis not present

## 2015-11-13 DIAGNOSIS — I10 Essential (primary) hypertension: Secondary | ICD-10-CM | POA: Diagnosis not present

## 2015-11-13 DIAGNOSIS — Z7982 Long term (current) use of aspirin: Secondary | ICD-10-CM | POA: Diagnosis not present

## 2015-11-15 DIAGNOSIS — Z7982 Long term (current) use of aspirin: Secondary | ICD-10-CM | POA: Diagnosis not present

## 2015-11-15 DIAGNOSIS — I10 Essential (primary) hypertension: Secondary | ICD-10-CM | POA: Diagnosis not present

## 2015-11-15 DIAGNOSIS — E785 Hyperlipidemia, unspecified: Secondary | ICD-10-CM | POA: Diagnosis not present

## 2015-11-15 DIAGNOSIS — K810 Acute cholecystitis: Secondary | ICD-10-CM | POA: Diagnosis not present

## 2015-11-15 DIAGNOSIS — G1221 Amyotrophic lateral sclerosis: Secondary | ICD-10-CM | POA: Diagnosis not present

## 2015-11-20 DIAGNOSIS — Z7982 Long term (current) use of aspirin: Secondary | ICD-10-CM | POA: Diagnosis not present

## 2015-11-20 DIAGNOSIS — E785 Hyperlipidemia, unspecified: Secondary | ICD-10-CM | POA: Diagnosis not present

## 2015-11-20 DIAGNOSIS — I10 Essential (primary) hypertension: Secondary | ICD-10-CM | POA: Diagnosis not present

## 2015-11-20 DIAGNOSIS — K810 Acute cholecystitis: Secondary | ICD-10-CM | POA: Diagnosis not present

## 2015-11-20 DIAGNOSIS — G1221 Amyotrophic lateral sclerosis: Secondary | ICD-10-CM | POA: Diagnosis not present

## 2015-11-21 ENCOUNTER — Ambulatory Visit
Admission: RE | Admit: 2015-11-21 | Discharge: 2015-11-21 | Disposition: A | Discharge status: Home / Self Care | Payer: Medicare Other | Source: Ambulatory Visit | Attending: General Surgery | Admitting: General Surgery

## 2015-11-21 DIAGNOSIS — K810 Acute cholecystitis: Secondary | ICD-10-CM

## 2015-11-21 DIAGNOSIS — Z431 Encounter for attention to gastrostomy: Secondary | ICD-10-CM | POA: Diagnosis not present

## 2015-11-21 HISTORY — PX: IR GENERIC HISTORICAL: IMG1180011

## 2015-11-21 MED ORDER — IOPAMIDOL (ISOVUE-300) INJECTION 61%
100.0000 mL | Freq: Once | INTRAVENOUS | Status: AC | PRN
  Administered 2015-11-21: 100 mL via INTRAVENOUS

## 2015-11-21 NOTE — Progress Notes (Addendum)
Patient ID: Thomas Raymond, male   DOB: 11/24/1945, 70 y.o.   MRN: [id number]    Chief Complaint: Patient was seen in consultation today for percutaneous cholecystostomy tube management  at the request of Jennings,Willard  Referring Physician(s): Jennings,Willard  Supervising Physician: Oley Balm  Patient Status: Outpatient  History of Present Illness: Thomas Raymond is a 70 y.o. male with a history of amyotrophic lateral sclerosis and recurrent acalculous cholecystitis. He is an extremely poor surgical candidate given his diagnosis of ALS. He presented for percutaneous cholecystostomy tube placement.   this could not be performed in the usual fashion with ultrasound guidance given evolution to frank emphysematous cholecystitis which obscures the gallbladder. Therefore, cholecystostomy tube placement   performed with CT guidance. Catheter is draining well. No leakage at the skin entry site.  Past Medical History:  Diagnosis Date  . ALLERGIC RHINITIS   . ALS   . HYPERLIPIDEMIA   . HYPERTENSION   . Impaired glucose tolerance 02/17/2011  . LOW BACK PAIN   . VERTIGO     Past Surgical History:  Procedure Laterality Date  . COMPRESSION HIP SCREW Left 02/07/2014   Procedure: IM Nail;  Surgeon: Harvie Junior, MD;  Location: [facility];  Service: Orthopedics;  Laterality: Left;  . Lipoma removal  2003  . Percutaneous gastrostomy tube repalcement     04/1999 and removed 07/1999  . TRACHEOSTOMY      Allergies: Review of patient's allergies indicates no known allergies.  Medications: Prior to Admission medications   Medication Sig Start Date End Date Taking? Authorizing Provider  acetaminophen (TYLENOL) 325 MG tablet Take 2 tablets (650 mg total) by mouth every 6 (six) hours as needed for mild pain (or temp > 100). 07/21/15   Emina Riebock, NP  albuterol (PROVENTIL HFA;VENTOLIN HFA) 108 (90 Base) MCG/ACT inhaler Inhale 2 puffs into the lungs every 6 (six) hours as needed for wheezing or  shortness of breath. 10/21/15   Nishant Dhungel, MD  amLODipine-benazepril (LOTREL) 10-40 MG capsule TAKE ONE CAPSULE BY MOUTH EVERY DAY 07/25/15   Corwin Levins, MD  atenolol (TENORMIN) 50 MG tablet Take 1 tablet (50 mg total) by mouth at bedtime. 02/23/15   Corwin Levins, MD  cetirizine (ZYRTEC) 10 MG tablet Take 1 tablet (10 mg total) by mouth daily. 10/25/15   Corwin Levins, MD  HYDROcodone-acetaminophen (NORCO/VICODIN) 5-325 MG tablet Take 2 tablets by mouth every 6 (six) hours as needed for moderate pain. 10/21/15   Nishant Dhungel, MD  omeprazole (PRILOSEC) 20 MG capsule TAKE ONE CAPSULE BY MOUTH EVERY DAY Patient taking differently: TAKE 20 MG BY MOUTH EVERY DAY 09/25/15   Corwin Levins, MD  pregabalin (LYRICA) 75 MG capsule Take 1 capsule (75 mg total) by mouth 2 (two) times daily. 08/24/15   Corwin Levins, MD  rosuvastatin (CRESTOR) 20 MG tablet Take 1 tablet (20 mg total) by mouth daily. 10/25/15   Corwin Levins, MD     Family History  Problem Relation Age of Onset  . Other Mother     Deceased  . Other Father     Deceased  . Other Son 2    x 2 starvation/illness from [place]  . Other Daughter 2    x 2 starvation/illness from refugee camp    Social History   Social History  . Marital status: Widowed    Spouse name: N/A  . Number of children: 4 D  . Years of education: N/A   Occupational History  .  disabled    Social History Main Topics  . Smoking status: Never Smoker  . Smokeless tobacco: Never Used  . Alcohol use No  . Drug use: No  . Sexual activity: No   Other Topics Concern  . Not on file   Social History Narrative   He lives with wife.   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  He went on disability in 2000 after diagnosis of ALS.   He moved from [place] in 1985.    ECOG Status: 3 - Symptomatic, >50% confined to bed  Review of Systems: A 12 point ROS discussed and pertinent positives are indicated in the HPI above.  All other systems are  negative.  Review of Systems  Vital Signs: BP (!) 160/85   Pulse 60   Temp 97.8 F (36.6 C) (Oral)   SpO2 98%   Physical Exam  Mallampati Score:     Imaging:  CT ABDOMEN AND PELVIS WITH CONTRAST  TECHNIQUE: Multidetector CT imaging of the abdomen and pelvis was performed using the standard protocol following bolus administration of intravenous contrast.  CONTRAST: [169mL ISOVUE-300 IOPAMIDOL (ISOVUE-300) INJECTION 61%]  COMPARISON: [10/17/2015 and previous]  FINDINGS: Lower chest:  [No effusion or pneumothorax.  Visualized lung bases clear.]    Hepatobiliary: [Percutaneous cholecystostomy catheter is well positioned in the lumen of the decompressed gallbladder.  There is some residual gallbladder wall thickening perhaps secondary to underdistention.  Resolution of the emphysematous changes in the gallbladder wall.  Minimal residual adjacent inflammatory/ edematous changes, dramatically improved from previous exam.  No pericholecystic fluid.  Normal liver.]    Pancreas: [No mass, inflammatory changes, or other significant abnormality.]    Spleen: [Within normal limits in size and appearance.]    Adrenals/Urinary Tract: [No masses identified. No evidence of hydronephrosis.]  Stable small right lower pole renal cyst.      Stomach/Bowel: [No evidence of obstruction, inflammatory process, or abnormal fluid collections.]  Normal appendix.      Vascular/Lymphatic: [Patchy aortoiliac arterial calcifications without aneurysm.  No adenopathy localized.  Portal vein patent.]    Reproductive: [Small central prostatic calcification.]    Other: [No ascites.  No free air.]    Musculoskeletal:  Degenerative disc disease L2-L5.  No fracture.  Orthopedic hardware across the left femoral neck.  Bilateral hip DJD.]  Marked atrophy of paraspinal and proximal thigh musculature as previously noted.    IMPRESSION: [  Percutaneous cholecystostomy catheter stable in  position with decompression of the gallbladder, resolution of emphysematous changes, and near complete resolution of pericholecystic inflammatory/edematous change without apparent complication.     Labs:  CBC:  Recent Labs  10/16/15 1808 10/17/15 0222 10/18/15 0837 10/20/15 0549  WBC 12.9* 13.4* 9.6 9.7  HGB 13.9 12.9* 11.1* 12.5*  HCT 41.8 38.1* 33.2* 38.6*  PLT 382 313 175 219    COAGS:  Recent Labs  10/17/15 1233  INR 1.19    BMP:  Recent Labs  10/17/15 0222 10/18/15 0837 10/19/15 0524 10/20/15 0549  NA 138 138 140 141  K 3.5 2.6* 3.9 3.4*  CL 107 111 112* 111  CO2 23 22 20* 24  GLUCOSE 154* 112* 87 108*  BUN CALCIUM 8.4* 7.6* 7.9* 8.0*  CREATININE <0.30* <0.30* 0.31* 0.33*  GFRNONAA NOT CALCULATED NOT CALCULATED >60 >60  GFRAA NOT CALCULATED NOT CALCULATED >60 >60    LIVER FUNCTION TESTS:  Recent Labs  10/16/15 1808 10/17/15 0222 10/18/15 4098 10/20/15 1191  BILITOT 1.2 0.7 1.1 0.4  AST 30 136* 42* 23  ALT 27 85* 53 32  ALKPHOS 116 136* 104 132*  PROT 8.3* 7.7 5.5* 6.2*  ALBUMIN 4.2 3.8 2.5* 2.6*    TUMOR MARKERS: No results for input(s): AFPTM, CEA, CA199, CHROMGRNA in the last 8760 hours.  Assessment and Plan:  My impression is that he's done well post percutaneous cholecystostomy catheter placement. The catheter is working well. Site looks good. CT looks good, without complications evident. He is scheduled to see the general surgeon in about 2 weeks. If they plan cholecystectomy,  they will take the drain catheter out at that time. If not, we'll need to set him up for routine outpatient  cholecystostomy catheter exchanges at about 8 week intervals. In the meantime, we will keep it to external gravity drainage. I discussed this with the patient and his daughter who also acted as Nurse, learning disability.  Thank you for this interesting consult.  I greatly enjoyed meeting [id number] and look forward to participating in their care.  A copy of this  report was sent to the requesting provider on this date.  Electronically Signed: Elizabet Schweppe III, DAYNE Livier Hendel 11/21/2015, 1:53 PM   I spent a total of   15 Minutes in face to face in clinical consultation, greater than 50% of which was counseling/coordinating care for percutaneous cholecystostomy catheter.

## 2015-11-22 ENCOUNTER — Telehealth: Payer: Self-pay

## 2015-11-22 NOTE — Telephone Encounter (Signed)
Personal Care Service Attestation Form received and completed, placed on MD's desk for signature

## 2015-11-22 NOTE — Telephone Encounter (Signed)
Paperwork completed, signed, and faxed

## 2015-11-28 DIAGNOSIS — G1221 Amyotrophic lateral sclerosis: Secondary | ICD-10-CM | POA: Diagnosis not present

## 2015-11-28 DIAGNOSIS — K810 Acute cholecystitis: Secondary | ICD-10-CM | POA: Diagnosis not present

## 2015-11-28 DIAGNOSIS — E785 Hyperlipidemia, unspecified: Secondary | ICD-10-CM | POA: Diagnosis not present

## 2015-11-28 DIAGNOSIS — I10 Essential (primary) hypertension: Secondary | ICD-10-CM | POA: Diagnosis not present

## 2015-11-28 DIAGNOSIS — Z7982 Long term (current) use of aspirin: Secondary | ICD-10-CM | POA: Diagnosis not present

## 2015-11-30 ENCOUNTER — Other Ambulatory Visit: Payer: Self-pay | Admitting: General Surgery

## 2015-11-30 DIAGNOSIS — K810 Acute cholecystitis: Secondary | ICD-10-CM

## 2015-11-30 DIAGNOSIS — K819 Cholecystitis, unspecified: Secondary | ICD-10-CM | POA: Diagnosis not present

## 2015-12-04 DIAGNOSIS — I10 Essential (primary) hypertension: Secondary | ICD-10-CM | POA: Diagnosis not present

## 2015-12-04 DIAGNOSIS — K810 Acute cholecystitis: Secondary | ICD-10-CM | POA: Diagnosis not present

## 2015-12-04 DIAGNOSIS — Z7982 Long term (current) use of aspirin: Secondary | ICD-10-CM | POA: Diagnosis not present

## 2015-12-04 DIAGNOSIS — E785 Hyperlipidemia, unspecified: Secondary | ICD-10-CM | POA: Diagnosis not present

## 2015-12-04 DIAGNOSIS — G1221 Amyotrophic lateral sclerosis: Secondary | ICD-10-CM | POA: Diagnosis not present

## 2015-12-11 DIAGNOSIS — K810 Acute cholecystitis: Secondary | ICD-10-CM | POA: Diagnosis not present

## 2015-12-11 DIAGNOSIS — G1221 Amyotrophic lateral sclerosis: Secondary | ICD-10-CM | POA: Diagnosis not present

## 2015-12-11 DIAGNOSIS — I10 Essential (primary) hypertension: Secondary | ICD-10-CM | POA: Diagnosis not present

## 2015-12-11 DIAGNOSIS — E785 Hyperlipidemia, unspecified: Secondary | ICD-10-CM | POA: Diagnosis not present

## 2015-12-11 DIAGNOSIS — Z7982 Long term (current) use of aspirin: Secondary | ICD-10-CM | POA: Diagnosis not present

## 2015-12-14 ENCOUNTER — Inpatient Hospital Stay: Admission: RE | Admit: 2015-12-14 | Payer: Medicare Other | Source: Ambulatory Visit

## 2015-12-14 ENCOUNTER — Ambulatory Visit
Admission: RE | Admit: 2015-12-14 | Discharge: 2015-12-14 | Disposition: A | Discharge status: Home / Self Care | Payer: Medicare Other | Source: Ambulatory Visit | Attending: General Surgery | Admitting: General Surgery

## 2015-12-14 ENCOUNTER — Other Ambulatory Visit: Payer: Medicare Other

## 2015-12-14 DIAGNOSIS — K810 Acute cholecystitis: Secondary | ICD-10-CM

## 2015-12-18 DIAGNOSIS — K810 Acute cholecystitis: Secondary | ICD-10-CM | POA: Diagnosis not present

## 2015-12-18 DIAGNOSIS — E785 Hyperlipidemia, unspecified: Secondary | ICD-10-CM | POA: Diagnosis not present

## 2015-12-18 DIAGNOSIS — G1221 Amyotrophic lateral sclerosis: Secondary | ICD-10-CM | POA: Diagnosis not present

## 2015-12-18 DIAGNOSIS — I10 Essential (primary) hypertension: Secondary | ICD-10-CM | POA: Diagnosis not present

## 2015-12-18 DIAGNOSIS — Z7982 Long term (current) use of aspirin: Secondary | ICD-10-CM | POA: Diagnosis not present

## 2016-01-17 ENCOUNTER — Telehealth: Payer: Self-pay | Admitting: *Deleted

## 2016-01-17 MED ORDER — PREGABALIN 75 MG PO CAPS: 75.0000 mg | ORAL_CAPSULE | Freq: Two times a day (BID) | ORAL | 3 refills | Status: DC

## 2016-01-17 NOTE — Telephone Encounter (Signed)
Done hardcopy to Corinne  

## 2016-01-17 NOTE — Telephone Encounter (Signed)
faxed

## 2016-01-17 NOTE — Telephone Encounter (Signed)
Rec'd fax stating pt insurance is requiring a 90 day on prescriptions. Needing rx for Lyrica 75 mg change to 90 day...Raechel Chute/lmb

## 2016-01-30 ENCOUNTER — Other Ambulatory Visit: Payer: Self-pay | Admitting: Internal Medicine

## 2016-02-27 ENCOUNTER — Ambulatory Visit: Payer: Medicare Other | Admitting: Internal Medicine

## 2016-04-23 ENCOUNTER — Other Ambulatory Visit: Payer: Self-pay | Admitting: Internal Medicine

## 2016-04-25 ENCOUNTER — Encounter: Payer: Self-pay | Admitting: Interventional Radiology

## 2016-04-27 ENCOUNTER — Other Ambulatory Visit: Payer: Self-pay | Admitting: Internal Medicine

## 2016-04-30 ENCOUNTER — Telehealth: Payer: Self-pay

## 2016-04-30 MED ORDER — METOPROLOL SUCCINATE ER 50 MG PO TB24: 50.0000 mg | ORAL_TABLET | Freq: Every day | ORAL | 3 refills | Status: DC

## 2016-04-30 NOTE — Addendum Note (Signed)
Addended by: [name], Leeland Lovelady W on: 04/30/2016 07:19 PM   Modules accepted: Orders

## 2016-04-30 NOTE — Telephone Encounter (Signed)
Please advise atenolol is on backorder please send alternative

## 2016-04-30 NOTE — Telephone Encounter (Signed)
Ok for change to toprol XL 50 qd - done erx

## 2016-05-30 ENCOUNTER — Ambulatory Visit (INDEPENDENT_AMBULATORY_CARE_PROVIDER_SITE_OTHER): Payer: Medicare Other | Admitting: Internal Medicine

## 2016-05-30 ENCOUNTER — Encounter: Payer: Self-pay | Admitting: Internal Medicine

## 2016-05-30 VITALS — BP 110/60 | HR 56 | Temp 98.3°F | Resp 16 | Ht 65.0 in | Wt 155.0 lb

## 2016-05-30 DIAGNOSIS — Z23 Encounter for immunization: Secondary | ICD-10-CM

## 2016-05-30 DIAGNOSIS — J988 Other specified respiratory disorders: Secondary | ICD-10-CM | POA: Diagnosis not present

## 2016-05-30 DIAGNOSIS — R05 Cough: Secondary | ICD-10-CM | POA: Diagnosis not present

## 2016-05-30 DIAGNOSIS — R059 Cough, unspecified: Secondary | ICD-10-CM

## 2016-05-30 MED ORDER — PROMETHAZINE-DM 6.25-15 MG/5ML PO SYRP: 5.0000 mL | ORAL_SOLUTION | Freq: Four times a day (QID) | ORAL | 0 refills | Status: DC | PRN

## 2016-05-30 MED ORDER — CEFDINIR 300 MG PO CAPS: 300.0000 mg | ORAL_CAPSULE | Freq: Two times a day (BID) | ORAL | 0 refills | Status: DC

## 2016-05-30 NOTE — Patient Instructions (Signed)
Cough, Adult Coughing is a reflex that clears your throat and your airways. Coughing helps to heal and protect your lungs. It is normal to cough occasionally, but a cough that happens with other symptoms or lasts a long time may be a sign of a condition that needs treatment. A cough may last only 2-3 weeks (acute), or it may last longer than 8 weeks (chronic). What are the causes? Coughing is commonly caused by:  Breathing in substances that irritate your lungs.  A viral or bacterial respiratory infection.  Allergies.  Asthma.  Postnasal drip.  Smoking.  Acid backing up from the stomach into the esophagus (gastroesophageal reflux).  Certain medicines.  Chronic lung problems, including COPD (or rarely, lung cancer).  Other medical conditions such as heart failure.  Follow these instructions at home: Pay attention to any changes in your symptoms. Take these actions to help with your discomfort:  Take medicines only as told by your health care provider. ? If you were prescribed an antibiotic medicine, take it as told by your health care provider. Do not stop taking the antibiotic even if you start to feel better. ? Talk with your health care provider before you take a cough suppressant medicine.  Drink enough fluid to keep your urine clear or pale yellow.  If the air is dry, use a cold steam vaporizer or humidifier in your bedroom or your home to help loosen secretions.  Avoid anything that causes you to cough at work or at home.  If your cough is worse at night, try sleeping in a semi-upright position.  Avoid cigarette smoke. If you smoke, quit smoking. If you need help quitting, ask your health care provider.  Avoid caffeine.  Avoid alcohol.  Rest as needed.  Contact a health care provider if:  You have new symptoms.  You cough up pus.  Your cough does not get better after 2-3 weeks, or your cough gets worse.  You cannot control your cough with suppressant  medicines and you are losing sleep.  You develop pain that is getting worse or pain that is not controlled with pain medicines.  You have a fever.  You have unexplained weight loss.  You have night sweats. Get help right away if:  You cough up blood.  You have difficulty breathing.  Your heartbeat is very fast. This information is not intended to replace advice given to you by your health care provider. Make sure you discuss any questions you have with your health care provider. Document Released: 10/05/2010 Document Revised: 09/14/2015 Document Reviewed: 06/15/2014 Elsevier Interactive Patient Education  2017 [employer]  

## 2016-05-30 NOTE — Progress Notes (Signed)
Subjective:  Patient ID: Thomas Raymond, male    DOB: 04/10/1946  Age: 71 y.o. MRN: [id number]  CC: Cough   HPI Thomas Raymond presents for A 5 day history of cough productive of green phlegm with no fever, chills, shortness of breath, hemoptysis, or wheezing.  Outpatient Medications Prior to Visit  Medication Sig Dispense Refill  . acetaminophen (TYLENOL) 325 MG tablet Take 2 tablets (650 mg total) by mouth every 6 (six) hours as needed for mild pain (or temp > 100).    [name]. amLODipine-benazepril (LOTREL) 10-40 MG capsule TAKE ONE CAPSULE BY MOUTH EVERY DAY 90 capsule 2  . cetirizine (ZYRTEC) 10 MG tablet TAKE 1 TABLET BY MOUTH EVERY DAY 90 tablet 1  . HYDROcodone-acetaminophen (NORCO/VICODIN) 5-325 MG tablet Take 2 tablets by mouth every 6 (six) hours as needed for moderate pain. 15 tablet 0  . metoprolol succinate (TOPROL-XL) 50 MG 24 hr tablet Take 1 tablet (50 mg total) by mouth daily. Take with or immediately following a meal. 90 tablet 3  . omeprazole (PRILOSEC) 20 MG capsule TAKE ONE CAPSULE BY MOUTH EVERY DAY 90 capsule 1  . rosuvastatin (CRESTOR) 20 MG tablet Take 1 tablet (20 mg total) by mouth daily. 90 tablet 2  . albuterol (PROVENTIL HFA;VENTOLIN HFA) 108 (90 Base) MCG/ACT inhaler Inhale 2 puffs into the lungs every 6 (six) hours as needed for wheezing or shortness of breath. (Patient not taking: Reported on 05/30/2016) 1 Inhaler 2  . pregabalin (LYRICA) 75 MG capsule Take 1 capsule (75 mg total) by mouth 2 (two) times daily. (Patient not taking: Reported on 05/30/2016) 180 capsule 3   No facility-administered medications prior to visit.     ROS Review of Systems  Constitutional: Negative for chills, fatigue and unexpected weight change.  HENT: Negative.  Negative for dental problem, facial swelling, sinus pressure and sore throat.   Eyes: Negative.   Respiratory: Positive for cough. Negative for chest tightness, shortness of breath and wheezing.   Cardiovascular: Negative for chest pain,  palpitations and leg swelling.  Gastrointestinal: Negative for abdominal pain, constipation, diarrhea, nausea and vomiting.  Endocrine: Negative.   Genitourinary: Negative.   Musculoskeletal: Negative.   Skin: Negative.  Negative for rash.  Allergic/Immunologic: Negative.   Neurological: Negative.   Hematological: Negative for adenopathy. Does not bruise/bleed easily.  Psychiatric/Behavioral: Negative.     Objective:  BP 110/60 (BP Location: Left Arm, Patient Position: Sitting, Cuff Size: Normal)   Pulse (!) 56   Temp 98.3 F (36.8 C) (Oral)   Resp 16   Ht 5\' 5"  (1.651 m)   Wt 155 lb (70.3 kg) Comment: weight includes wheelchair  SpO2 96%   BMI 25.79 kg/m   BP Readings from Last 3 Encounters:  05/30/16 110/60  11/21/15 (!) 160/85  11/07/15 128/82    Wt Readings from Last 3 Encounters:  05/30/16 155 lb (70.3 kg)  11/07/15 150 lb (68 kg)  10/17/15 151 lb (68.5 kg)    Physical Exam  Constitutional: He is oriented to person, place, and time.  Non-toxic appearance. He does not have a sickly appearance. He does not appear ill. No distress.  Wheelchair-bound  HENT:  Mouth/Throat: Oropharynx is clear and moist. No oropharyngeal exudate.  Eyes: Conjunctivae are normal. Right eye exhibits no discharge. Left eye exhibits no discharge. No scleral icterus.  Neck: Normal range of motion. Neck supple. No JVD present. No tracheal deviation present. No thyromegaly present.  Cardiovascular: Normal rate, regular rhythm, normal heart sounds and intact  distal pulses.  Exam reveals no gallop and no friction rub.   No murmur heard. Pulmonary/Chest: Effort normal. No accessory muscle usage or stridor. No tachypnea. No respiratory distress. He has no decreased breath sounds. He has no wheezes. He has no rhonchi. He has rales in the right lower field and the left lower field. He exhibits no tenderness.  Abdominal: Soft. Bowel sounds are normal. He exhibits no distension and no mass. There is no  tenderness. There is no rebound and no guarding.  Musculoskeletal: Normal range of motion. He exhibits no edema, tenderness or deformity.  Lymphadenopathy:    He has no cervical adenopathy.  Neurological: He is oriented to person, place, and time.  Skin: Skin is warm and dry. No rash noted. He is not diaphoretic. No erythema. No pallor.  Vitals reviewed.   Lab Results  Component Value Date   WBC 9.7 10/20/2015   HGB 12.5 (L) 10/20/2015   HCT 38.6 (L) 10/20/2015   PLT 219 10/20/2015   GLUCOSE 108 (H) 10/20/2015   CHOL 143 02/23/2015   TRIG 99.0 02/23/2015   HDL 51.60 02/23/2015   LDLDIRECT 126.4 09/22/2006   LDLCALC 72 02/23/2015   ALT 32 10/20/2015   AST 23 10/20/2015   NA 141 10/20/2015   K 3.4 (L) 10/20/2015   CL 111 10/20/2015   CREATININE 0.33 (L) 10/20/2015   BUN 7 10/20/2015   CO2 24 10/20/2015   TSH 0.629 10/17/2015   PSA 0.23 02/23/2015   INR 1.19 10/17/2015   HGBA1C 6.2 02/23/2015    Dg Sinus/fist Tube Chk-non Gi  Result Date: 12/14/2015 INDICATION: History of ALS and acalculous cholecystitis requiring percutaneous cholecystostomy tube placement on 10/17/2015. The patient has tolerated capping of the tube. EXAM: CHOLANGIOGRAM THROUGH PRE-EXISTING CHOLECYSTOSTOMY TUBE MEDICATIONS: None. ANESTHESIA/SEDATION: None FLUOROSCOPY TIME:  Fluoroscopy Time: 42 seconds. CONTRAST:  15 mL Isovue-300 COMPLICATIONS: None immediate. PROCEDURE: The cholecystostomy tube was injected with contrast material. Fluoroscopic spot images were obtained. After the procedure, the tube was cut and removed. A dressing was applied over the catheter exit site. FINDINGS: The cholecystostomy tube remains positioned within the gallbladder lumen. After initial partial distension of the gallbladder with contrast, there is immediate opacification of a patent cystic duct. Normal drainage of contrast material was then noted into the common bile duct with normal contrast passage into the duodenum. Opacified  intrahepatic ducts appear normal. No biliary filling defects identified. IMPRESSION: Normally patent cystic duct, common bile duct and visualized intrahepatic ducts by cholangiography. The percutaneous cholecystostomy tube was removed following the cholangiogram. Electronically Signed   By: Irish Lack M.D.   On: 12/14/2015 14:30    Assessment & Plan:   Quantay was seen today for cough.  Diagnoses and all orders for this visit:  Cough- will check a chest x-ray to screen for mass and infiltrate, will control the cough with Phenergan DM. -     cefdinir (OMNICEF) 300 MG capsule; Take 1 capsule (300 mg total) by mouth 2 (two) times daily. -     promethazine-dextromethorphan (PROMETHAZINE-DM) 6.25-15 MG/5ML syrup; Take 5 mLs by mouth 4 (four) times daily as needed for cough. -     DG Chest 2 View; Future  Respiratory tract infection- will treat for bacterial causes with Omnicef.  Need for prophylactic vaccination and inoculation against influenza -     Flu vaccine HIGH DOSE PF   I am having Mr. Buhman start on cefdinir and promethazine-dextromethorphan. I am also having him maintain his acetaminophen, albuterol, HYDROcodone-acetaminophen,  rosuvastatin, pregabalin, omeprazole, amLODipine-benazepril, cetirizine, and metoprolol succinate.  Meds ordered this encounter  Medications  . cefdinir (OMNICEF) 300 MG capsule    Sig: Take 1 capsule (300 mg total) by mouth 2 (two) times daily.    Dispense:  20 capsule    Refill:  0  . promethazine-dextromethorphan (PROMETHAZINE-DM) 6.25-15 MG/5ML syrup    Sig: Take 5 mLs by mouth 4 (four) times daily as needed for cough.    Dispense:  118 mL    Refill:  0     Follow-up: Return if symptoms worsen or fail to improve.  Sanda Linger, MD

## 2016-05-30 NOTE — Progress Notes (Signed)
Pre visit review using our clinic review tool, if applicable. No additional management support is needed unless otherwise documented below in the visit note. 

## 2016-06-07 ENCOUNTER — Telehealth: Payer: Self-pay | Admitting: Internal Medicine

## 2016-06-07 NOTE — Telephone Encounter (Signed)
Pt daughter called in and said that pt was seen last week by dr Yetta Barrejones and the cough meds that was given did not help him.  She said he has taken all of it but it is not any better.  Anything else that could be call in?    Best number [phone]

## 2016-06-09 MED ORDER — BENZONATATE 100 MG PO CAPS: ORAL_CAPSULE | ORAL | 1 refills | Status: DC

## 2016-06-09 NOTE — Telephone Encounter (Signed)
Ok for trial of tessalon perle - done erx

## 2016-06-10 NOTE — Telephone Encounter (Signed)
Tried to call pt. Phone went to vm and it has not been set up.

## 2016-09-02 ENCOUNTER — Other Ambulatory Visit: Payer: Self-pay | Admitting: Internal Medicine

## 2016-09-03 NOTE — Telephone Encounter (Signed)
Done hardcopy to Shirron  Please remind pt he is due for ROV in july

## 2016-09-03 NOTE — Telephone Encounter (Signed)
Faxed.  Please schedule appt for pt.

## 2016-09-06 NOTE — Telephone Encounter (Signed)
Tried to call pt. No answer and there was no voicemail set up.

## 2016-09-10 NOTE — Telephone Encounter (Signed)
Tried to contact pt again. Still no answer and no voicemail.

## 2016-09-19 ENCOUNTER — Encounter: Payer: Self-pay | Admitting: Internal Medicine

## 2016-09-19 ENCOUNTER — Other Ambulatory Visit (INDEPENDENT_AMBULATORY_CARE_PROVIDER_SITE_OTHER): Payer: Medicare Other

## 2016-09-19 ENCOUNTER — Ambulatory Visit (INDEPENDENT_AMBULATORY_CARE_PROVIDER_SITE_OTHER): Payer: Medicare Other | Admitting: Internal Medicine

## 2016-09-19 VITALS — BP 140/86 | HR 86 | Ht 61.0 in | Wt 151.0 lb

## 2016-09-19 DIAGNOSIS — E785 Hyperlipidemia, unspecified: Secondary | ICD-10-CM

## 2016-09-19 DIAGNOSIS — Z1159 Encounter for screening for other viral diseases: Secondary | ICD-10-CM

## 2016-09-19 DIAGNOSIS — N320 Bladder-neck obstruction: Secondary | ICD-10-CM | POA: Diagnosis not present

## 2016-09-19 DIAGNOSIS — R7302 Impaired glucose tolerance (oral): Secondary | ICD-10-CM

## 2016-09-19 DIAGNOSIS — I10 Essential (primary) hypertension: Secondary | ICD-10-CM

## 2016-09-19 DIAGNOSIS — J069 Acute upper respiratory infection, unspecified: Secondary | ICD-10-CM | POA: Diagnosis not present

## 2016-09-19 LAB — BASIC METABOLIC PANEL
BUN: 9 mg/dL (ref 6–23)
CHLORIDE: 102 meq/L (ref 96–112)
CO2: 29 meq/L (ref 19–32)
CREATININE: 0.39 mg/dL — AB (ref 0.40–1.50)
Calcium: 9.5 mg/dL (ref 8.4–10.5)
GFR: 231.48 mL/min (ref >60.00)
Glucose, Bld: 92 mg/dL (ref 70–99)
Potassium: 4.1 mEq/L (ref 3.5–5.1)
Sodium: 140 mEq/L (ref 135–145)

## 2016-09-19 LAB — URINALYSIS, ROUTINE W REFLEX MICROSCOPIC
Bilirubin Urine: NEGATIVE
KETONES UR: NEGATIVE
Leukocytes, UA: NEGATIVE
Nitrite: NEGATIVE
SPECIFIC GRAVITY, URINE: 1.02 (ref 1.000–1.030)
Total Protein, Urine: 100 — AB
UROBILINOGEN UA: 0.2 (ref 0.0–1.0)
Urine Glucose: NEGATIVE
pH: 6 (ref 5.0–8.0)

## 2016-09-19 LAB — LIPID PANEL
CHOLESTEROL: 135 mg/dL (ref 0–200)
HDL: 48.2 mg/dL (ref >39.00)
LDL Cholesterol: 61 mg/dL (ref 0–99)
NonHDL: 86.73
TRIGLYCERIDES: 127 mg/dL (ref 0.0–149.0)
Total CHOL/HDL Ratio: 3
VLDL: 25.4 mg/dL (ref 0.0–40.0)

## 2016-09-19 LAB — CBC WITH DIFFERENTIAL/PLATELET
BASOS ABS: 0.1 10*3/uL (ref 0.0–0.1)
Basophils Relative: 1 % (ref 0.0–3.0)
EOS ABS: 0.1 10*3/uL (ref 0.0–0.7)
Eosinophils Relative: 0.8 % (ref 0.0–5.0)
HEMATOCRIT: 42.1 % (ref 39.0–52.0)
HEMOGLOBIN: 14 g/dL (ref 13.0–17.0)
LYMPHS PCT: 15 % (ref 12.0–46.0)
Lymphs Abs: 1.4 10*3/uL (ref 0.7–4.0)
MCHC: 33.2 g/dL (ref 30.0–36.0)
MCV: 87.8 fl (ref 78.0–100.0)
MONO ABS: 0.9 10*3/uL (ref 0.1–1.0)
Monocytes Relative: 9.6 % (ref 3.0–12.0)
NEUTROS ABS: 6.9 10*3/uL (ref 1.4–7.7)
Neutrophils Relative %: 73.6 % (ref 43.0–77.0)
PLATELETS: 295 10*3/uL (ref 150.0–400.0)
RBC: 4.79 Mil/uL (ref 4.22–5.81)
RDW: 14.1 % (ref 11.5–15.5)
WBC: 9.4 10*3/uL (ref 4.0–10.5)

## 2016-09-19 LAB — HEPATIC FUNCTION PANEL
ALT: 17 U/L (ref 0–53)
AST: 20 U/L (ref 0–37)
Albumin: 4.4 g/dL (ref 3.5–5.2)
Alkaline Phosphatase: 81 U/L (ref 39–117)
Bilirubin, Direct: 0.1 mg/dL (ref 0.0–0.3)
TOTAL PROTEIN: 8.3 g/dL (ref 6.0–8.3)
Total Bilirubin: 0.4 mg/dL (ref 0.2–1.2)

## 2016-09-19 LAB — HEMOGLOBIN A1C: HEMOGLOBIN A1C: 6.1 % (ref 4.6–6.5)

## 2016-09-19 LAB — TSH: TSH: 0.71 u[IU]/mL (ref 0.35–4.50)

## 2016-09-19 LAB — PSA: PSA: 0.31 ng/mL (ref 0.10–4.00)

## 2016-09-19 LAB — HEPATITIS C ANTIBODY: HCV AB: NEGATIVE

## 2016-09-19 MED ORDER — OMEPRAZOLE 20 MG PO CPDR: 20.0000 mg | DELAYED_RELEASE_CAPSULE | Freq: Every day | ORAL | 3 refills | Status: DC

## 2016-09-19 MED ORDER — METOPROLOL SUCCINATE ER 50 MG PO TB24: 50.0000 mg | ORAL_TABLET | Freq: Every day | ORAL | 3 refills | Status: DC

## 2016-09-19 MED ORDER — ROSUVASTATIN CALCIUM 20 MG PO TABS: 20.0000 mg | ORAL_TABLET | Freq: Every day | ORAL | 3 refills | Status: DC

## 2016-09-19 MED ORDER — CETIRIZINE HCL 10 MG PO TABS: 10.0000 mg | ORAL_TABLET | Freq: Every day | ORAL | 3 refills | Status: DC

## 2016-09-19 MED ORDER — AZITHROMYCIN 250 MG PO TABS: ORAL_TABLET | ORAL | 1 refills | Status: DC

## 2016-09-19 MED ORDER — AMLODIPINE BESY-BENAZEPRIL HCL 10-40 MG PO CAPS: 1.0000 | ORAL_CAPSULE | Freq: Every day | ORAL | 3 refills | Status: DC

## 2016-09-19 MED ORDER — HYDROCODONE-HOMATROPINE 5-1.5 MG/5ML PO SYRP: 5.0000 mL | ORAL_SOLUTION | Freq: Four times a day (QID) | ORAL | 0 refills | Status: AC | PRN

## 2016-09-19 NOTE — Assessment & Plan Note (Signed)
stable overall by history and exam, recent data reviewed with pt, and pt to continue medical treatment as before,  to f/u any worsening symptoms or concerns BP Readings from Last 3 Encounters:  09/19/16 140/86  05/30/16 110/60  11/21/15 (!) 160/85

## 2016-09-19 NOTE — Assessment & Plan Note (Signed)
stable overall by history and exam, recent data reviewed with pt, and pt to continue medical treatment as before,  to f/u any worsening symptoms or concerns  Lab Results  Component Value Date   LDLCALC 72 02/23/2015

## 2016-09-19 NOTE — Progress Notes (Signed)
Subjective:    Patient ID: Thomas Raymond, male    DOB: 01/02/1946, 71 y.o.   MRN: [id number]  HPI Here for yearly f/u;  Overall doing ok; [name].  Pt denies neurological change such as new headache, facial or extremity weakness.  Pt denies polydipsia, polyuria, or low sugar symptoms. Pt states overall good compliance with treatment and medications, good tolerability, and has been trying to follow appropriate diet.  Pt denies worsening depressive symptoms, suicidal ideation or panic. No fever, night sweats, wt loss, loss of appetite, or other constitutional symptoms.  Pt states good ability with ADL's, has low fall risk, home safety reviewed and adequate, no other significant changes in hearing or vision, and not active with exercise, has bed to chair status, and powerchair at home.  Also,  Here with 5 days acute onset feverish feeling, facial pain, pressure, headache, acute on chronic general weakness and malaise, with mild ST and non prod cough, but pt denies chest pain, wheezing, increased sob or doe, orthopnea, PND, increased LE swelling, palpitations, dizziness or syncope. Tessalon did not help cough last time. Has done well without need for urgent care after S/p CCX after episode acalculous cholecystitis July 2017 with acute resp failure, low K and MG.  Has not seen neurology recently due to transportation difficulty, wants to hold on any f/u for now Past Medical History:  Diagnosis Date  . ALLERGIC RHINITIS   . ALS   . HYPERLIPIDEMIA   . HYPERTENSION   . Impaired glucose tolerance 02/17/2011  . LOW BACK PAIN   . VERTIGO    Past Surgical History:  Procedure Laterality Date  . COMPRESSION HIP SCREW Left 02/07/2014   Procedure: IM Nail;  Surgeon: Harvie JuniorJohn L Graves, MD;  Location: [facility];  Service: Orthopedics;  Laterality: Left;  . IR GENERIC HISTORICAL  11/21/2015   IR RADIOLOGIST EVAL & MGMT 11/21/2015 Oley Balmaniel Hassell, MD [facility] INTERV RAD  . Lipoma removal  2003  . Percutaneous gastrostomy tube repalcement      04/1999 and removed 07/1999  . TRACHEOSTOMY      reports that he has never smoked. He has never used smokeless tobacco. He reports that he does not drink alcohol or use drugs. family history includes Other in his father and mother; Other (age of onset: 2) in his daughter and son. No Known Allergies Current Outpatient Prescriptions on File Prior to Visit  Medication Sig Dispense Refill  . acetaminophen (TYLENOL) 325 MG tablet Take 2 tablets (650 mg total) by mouth every 6 (six) hours as needed for mild pain (or temp > 100).    [name]. albuterol (PROVENTIL HFA;VENTOLIN HFA) 108 (90 Base) MCG/ACT inhaler Inhale 2 puffs into the lungs every 6 (six) hours as needed for wheezing or shortness of breath. 1 Inhaler 2  . amLODipine-benazepril (LOTREL) 10-40 MG capsule TAKE ONE CAPSULE BY MOUTH EVERY DAY 90 capsule 2  . benzonatate (TESSALON PERLES) 100 MG capsule 1-2 tabs by mouth every 6 hrs as needed 60 capsule 1  . cetirizine (ZYRTEC) 10 MG tablet TAKE 1 TABLET BY MOUTH EVERY DAY 90 tablet 1  . HYDROcodone-acetaminophen (NORCO/VICODIN) 5-325 MG tablet Take 2 tablets by mouth every 6 (six) hours as needed for moderate pain. 15 tablet 0  . LYRICA 75 MG capsule TAKE ONE CAPSULE BY MOUTH TWICE A DAY 180 capsule 0  . metoprolol succinate (TOPROL-XL) 50 MG 24 hr tablet Take 1 tablet (50 mg total) by mouth daily. Take with or immediately following a meal. 90  tablet 3  . omeprazole (PRILOSEC) 20 MG capsule TAKE ONE CAPSULE BY MOUTH EVERY DAY 90 capsule 1  . rosuvastatin (CRESTOR) 20 MG tablet TAKE 1 TABLET BY MOUTH EVERY DAY 90 tablet 0   No current facility-administered medications on file prior to visit.    Review of Systems Constitutional: Negative for other unusual diaphoresis, sweats, appetite or weight changes HENT: Negative for other worsening hearing loss, ear pain, facial swelling, mouth sores or neck stiffness.   Eyes: Negative for other worsening pain, redness or other visual disturbance.    Respiratory: Negative for other stridor or swelling Cardiovascular: Negative for other palpitations or other chest pain  Gastrointestinal: Negative for worsening diarrhea or loose stools, blood in stool, distention or other pain Genitourinary: Negative for hematuria, flank pain or other change in urine volume.  Musculoskeletal: Negative for myalgias or other joint swelling.  Skin: Negative for other color change, or other wound or worsening drainage.  Neurological: Negative for other syncope or numbness. Hematological: Negative for other adenopathy or swelling Psychiatric/Behavioral: Negative for hallucinations, other worsening agitation, SI, self-injury, or new decreased concentration All other system neg per pt    Objective:   Physical Exam BP 140/86   Pulse 86   Ht 5\' 1"  (1.549 m)   Wt 151 lb (68.5 kg)   SpO2 98%   BMI 28.53 kg/m  VS noted, mild ill, examined in wheelchair Constitutional: Pt is oriented to person, place, and time. Appears well-developed and well-nourished, in no significant distress and comfortable Head: Normocephalic and atraumatic  Eyes: Conjunctivae and EOM are normal. Pupils are equal, round, and reactive to light Right Ear: External ear normal without discharge Left Ear: External ear normal without discharge Nose: Nose without discharge or deformity Bilat tm's with mild erythema.  Max sinus areas non tender.  Pharynx with mild erythema, no exudate Mouth/Throat: Oropharynx is without other ulcerations and moist  Neck: Normal range of motion. Neck supple. No JVD present. No tracheal deviation present or significant neck LA or mass Cardiovascular: Normal rate, regular rhythm, normal heart sounds and intact distal pulses.   Pulmonary/Chest: WOB normal and breath sounds decreased without rales or wheezing  Abdominal: Soft. Bowel sounds are normal. NT. No HSM  Musculoskeletal: Normal range of motion. Exhibits no edema Lymphadenopathy: Has no other cervical  adenopathy.  Neurological: Pt is alert and oriented to person, place, and time. Pt has normal reflexes. No cranial nerve deficit. Motor with generalized deficit, unable to stand, can move arms and legs however Skin: Skin is warm and dry. No rash noted or new ulcerations Psychiatric:  Has normal mood and affect. Behavior is normal without agitation No other exam findings Lab Results  Component Value Date   WBC 9.7 10/20/2015   HGB 12.5 (L) 10/20/2015   HCT 38.6 (L) 10/20/2015   PLT 219 10/20/2015   GLUCOSE 108 (H) 10/20/2015   CHOL 143 02/23/2015   TRIG 99.0 02/23/2015   HDL 51.60 02/23/2015   LDLDIRECT 126.4 09/22/2006   LDLCALC 72 02/23/2015   ALT 32 10/20/2015   AST 23 10/20/2015   NA 141 10/20/2015   K 3.4 (L) 10/20/2015   CL 111 10/20/2015   CREATININE 0.33 (L) 10/20/2015   BUN 7 10/20/2015   CO2 24 10/20/2015   TSH 0.629 10/17/2015   PSA 0.23 02/23/2015   INR 1.19 10/17/2015   HGBA1C 6.2 02/23/2015        Assessment & Plan:

## 2016-09-19 NOTE — Assessment & Plan Note (Signed)
Mild to mod, for antibx course,  to f/u any worsening symptoms or concerns 

## 2016-09-19 NOTE — Patient Instructions (Addendum)
Please take all new medication as prescribed - the antibiotic, and cough medicine if needed  Please continue all other medications as before, and refills have been done   Please have the pharmacy call with any other refills you may need.  Please continue your efforts at being more active, low cholesterol diet, and weight control.  You are otherwise up to date with prevention measures today.  Please keep your appointments with your specialists as you may have planned  Please go to the LAB in the Basement (turn left off the elevator) for the tests to be done today  You will be contacted by phone if any changes need to be made immediately.  Otherwise, you will receive a letter about your results with an explanation, but please check with MyChart first.  Please remember to sign up for MyChart if you have not done so, as this will be important to you in the future with finding out test results, communicating by private email, and scheduling acute appointments online when needed.  Please return in 1 year for your yearly visit, or sooner if needed

## 2016-09-19 NOTE — Assessment & Plan Note (Signed)
Asympt, for psa as he is due 

## 2016-09-19 NOTE — Assessment & Plan Note (Signed)
stable overall by history and exam, recent data reviewed with pt, and pt to continue medical treatment as before,  to f/u any worsening symptoms or concerns Lab Results  Component Value Date   HGBA1C 6.2 02/23/2015    

## 2016-09-20 ENCOUNTER — Encounter: Payer: Self-pay | Admitting: Internal Medicine

## 2016-12-16 ENCOUNTER — Other Ambulatory Visit: Payer: Self-pay | Admitting: Internal Medicine

## 2016-12-17 NOTE — Telephone Encounter (Signed)
Faxed

## 2016-12-17 NOTE — Telephone Encounter (Signed)
Done hardcopy to Shirron  

## 2017-05-05 ENCOUNTER — Other Ambulatory Visit: Payer: Self-pay | Admitting: Internal Medicine

## 2017-05-16 ENCOUNTER — Ambulatory Visit (INDEPENDENT_AMBULATORY_CARE_PROVIDER_SITE_OTHER): Payer: Medicare Other | Admitting: Internal Medicine

## 2017-05-16 ENCOUNTER — Encounter: Payer: Self-pay | Admitting: Internal Medicine

## 2017-05-16 VITALS — BP 136/86 | HR 67 | Temp 97.8°F | Ht 61.0 in

## 2017-05-16 DIAGNOSIS — Z23 Encounter for immunization: Secondary | ICD-10-CM

## 2017-05-16 DIAGNOSIS — I10 Essential (primary) hypertension: Secondary | ICD-10-CM | POA: Diagnosis not present

## 2017-05-16 DIAGNOSIS — R7302 Impaired glucose tolerance (oral): Secondary | ICD-10-CM | POA: Diagnosis not present

## 2017-05-16 DIAGNOSIS — J019 Acute sinusitis, unspecified: Secondary | ICD-10-CM

## 2017-05-16 MED ORDER — AZITHROMYCIN 250 MG PO TABS: ORAL_TABLET | ORAL | 1 refills | Status: DC

## 2017-05-16 NOTE — Progress Notes (Signed)
Subjective:    Patient ID: Thomas Raymond, male    DOB: 1946/04/13, 72 y.o.   MRN: [id number]  HPI   Here with 2-3 days acute onset fever, facial pain, pressure, headache, general weakness and malaise, and greenish d/c, with mild ST and cough, but pt denies chest pain, wheezing, increased sob or doe, orthopnea, PND, increased LE swelling, palpitations, dizziness or syncope.   Pt denies polydipsia, polyuria, Remains wheelchair bound, and Pt denies new neurological symptoms such as new headache, or worsening facial or extremity weakness or numbness   No other interval hx or complaints Past Medical History:  Diagnosis Date  . ALLERGIC RHINITIS   . ALS   . HYPERLIPIDEMIA   . HYPERTENSION   . Impaired glucose tolerance 02/17/2011  . LOW BACK PAIN   . VERTIGO    Past Surgical History:  Procedure Laterality Date  . COMPRESSION HIP SCREW Left 02/07/2014   Procedure: IM Nail;  Surgeon: Harvie Junior, MD;  Location: [facility];  Service: Orthopedics;  Laterality: Left;  . IR GENERIC HISTORICAL  11/21/2015   IR RADIOLOGIST EVAL & MGMT 11/21/2015 Oley Balm, MD [facility] INTERV RAD  . Lipoma removal  2003  . Percutaneous gastrostomy tube repalcement     04/1999 and removed 07/1999  . TRACHEOSTOMY      reports that  has never smoked. he has never used smokeless tobacco. He reports that he does not drink alcohol or use drugs. family history includes Other in his father and mother; Other (age of onset: 2) in his daughter and son. No Known Allergies Current Outpatient Medications on File Prior to Visit  Medication Sig Dispense Refill  . acetaminophen (TYLENOL) 325 MG tablet Take 2 tablets (650 mg total) by mouth every 6 (six) hours as needed for mild pain (or temp > 100).    [name] albuterol (PROVENTIL HFA;VENTOLIN HFA) 108 (90 Base) MCG/ACT inhaler Inhale 2 puffs into the lungs every 6 (six) hours as needed for wheezing or shortness of breath. 1 Inhaler 2  . amLODipine-benazepril (LOTREL) 10-40 MG capsule Take 1  capsule by mouth daily. 90 capsule 3  . atenolol (TENORMIN) 50 MG tablet TAKE 1 TABLET BY MOUTH AT BEDTIME 90 tablet 0  . cetirizine (ZYRTEC) 10 MG tablet Take 1 tablet (10 mg total) by mouth daily. 90 tablet 3  . LYRICA 75 MG capsule TAKE ONE CAPSULE BY MOUTH TWICE A DAY 180 capsule 1  . metoprolol succinate (TOPROL-XL) 50 MG 24 hr tablet Take 1 tablet (50 mg total) by mouth daily. Take with or immediately following a meal. 90 tablet 3  . omeprazole (PRILOSEC) 20 MG capsule Take 1 capsule (20 mg total) by mouth daily. 90 capsule 3  . rosuvastatin (CRESTOR) 20 MG tablet Take 1 tablet (20 mg total) by mouth daily. 90 tablet 3   No current facility-administered medications on file prior to visit.    Review of Systems  Constitutional: Negative for other unusual diaphoresis or sweats HENT: Negative for ear discharge or swelling Eyes: Negative for other worsening visual disturbances Respiratory: Negative for stridor or other swelling  Gastrointestinal: Negative for worsening distension or other blood Genitourinary: Negative for retention or other urinary change Musculoskeletal: Negative for other MSK pain or swelling Skin: Negative for color change or other new lesions Neurological: Negative for worsening tremors and other numbness  Psychiatric/Behavioral: Negative for worsening agitation or other fatigue All other system neg per pt    Objective:   Physical Exam BP 136/86  Pulse 67   Temp 97.8 F (36.6 C) (Oral)   Ht 5\' 1"  (1.549 m)   SpO2 99%   BMI 28.53 kg/m  VS noted,  Constitutional: Pt appears in NAD HENT: Head: NCAT.  Right Ear: External ear normal.  Left Ear: External ear normal.  Eyes: . Pupils are equal, round, and reactive to light. Conjunctivae and EOM are normal Nose: without d/c or deformity Bilat tm's with mild erythema.  Max sinus areas mild tender.  Pharynx with mild erythema, no exudate  Neck: Neck supple. Gross normal ROM Cardiovascular: Normal rate and regular  rhythm.   Pulmonary/Chest: Effort normal and breath sounds without rales or wheezing.  Neurological: Pt is alert. At baseline orientation, o/w not done in detail Skin: Skin is warm. No rashes, other new lesions, no LE edema Psychiatric: Pt behavior is normal without agitation  No other exam findings    Assessment & Plan:

## 2017-05-16 NOTE — Patient Instructions (Signed)
Please take all new medication as prescribed - the antibiotic  You can also take Delsym OTC for cough, and/or Mucinex (or it's generic off brand) for congestion, and tylenol as needed for pain.  Please continue all other medications as before, and refills have been done if requested.  Please have the pharmacy call with any other refills you may need.  Please keep your appointments with your specialists as you may have planned  Please return in 6 months, or sooner if needed

## 2017-05-18 DIAGNOSIS — J019 Acute sinusitis, unspecified: Secondary | ICD-10-CM | POA: Insufficient documentation

## 2017-05-18 NOTE — Assessment & Plan Note (Signed)
stable overall by history and exam, recent data reviewed with pt, and pt to continue medical treatment as before,  to f/u any worsening symptoms or concerns Lab Results  Component Value Date   HGBA1C 6.1 09/19/2016

## 2017-05-18 NOTE — Assessment & Plan Note (Signed)
Mild to mod, for antibx course,  to f/u any worsening symptoms or concerns 

## 2017-05-18 NOTE — Assessment & Plan Note (Signed)
stable overall by history and exam, recent data reviewed with pt, and pt to continue medical treatment as before,  to f/u any worsening symptoms or concerns BP Readings from Last 3 Encounters:  05/16/17 136/86  09/19/16 140/86  05/30/16 110/60

## 2017-08-01 ENCOUNTER — Other Ambulatory Visit: Payer: Self-pay | Admitting: Internal Medicine

## 2017-10-06 ENCOUNTER — Other Ambulatory Visit: Payer: Self-pay | Admitting: Internal Medicine

## 2017-10-11 ENCOUNTER — Other Ambulatory Visit: Payer: Self-pay | Admitting: Internal Medicine

## 2017-10-16 ENCOUNTER — Other Ambulatory Visit: Payer: Self-pay | Admitting: Internal Medicine

## 2017-10-26 ENCOUNTER — Other Ambulatory Visit: Payer: Self-pay | Admitting: Internal Medicine

## 2017-10-30 ENCOUNTER — Other Ambulatory Visit: Payer: Self-pay | Admitting: Internal Medicine

## 2017-11-06 ENCOUNTER — Other Ambulatory Visit: Payer: Self-pay | Admitting: Internal Medicine

## 2017-11-07 ENCOUNTER — Other Ambulatory Visit: Payer: Self-pay | Admitting: Internal Medicine

## 2017-11-13 ENCOUNTER — Encounter: Payer: Self-pay | Admitting: Internal Medicine

## 2017-11-13 ENCOUNTER — Ambulatory Visit (INDEPENDENT_AMBULATORY_CARE_PROVIDER_SITE_OTHER): Payer: Medicare Other | Admitting: Internal Medicine

## 2017-11-13 ENCOUNTER — Other Ambulatory Visit (INDEPENDENT_AMBULATORY_CARE_PROVIDER_SITE_OTHER): Payer: Medicare Other

## 2017-11-13 VITALS — BP 124/76 | HR 64 | Temp 97.6°F | Ht 61.0 in

## 2017-11-13 DIAGNOSIS — N320 Bladder-neck obstruction: Secondary | ICD-10-CM

## 2017-11-13 DIAGNOSIS — E785 Hyperlipidemia, unspecified: Secondary | ICD-10-CM

## 2017-11-13 DIAGNOSIS — I10 Essential (primary) hypertension: Secondary | ICD-10-CM | POA: Diagnosis not present

## 2017-11-13 DIAGNOSIS — R7302 Impaired glucose tolerance (oral): Secondary | ICD-10-CM

## 2017-11-13 DIAGNOSIS — H109 Unspecified conjunctivitis: Secondary | ICD-10-CM | POA: Diagnosis not present

## 2017-11-13 LAB — URINALYSIS, ROUTINE W REFLEX MICROSCOPIC
BILIRUBIN URINE: NEGATIVE
HGB URINE DIPSTICK: NEGATIVE
KETONES UR: NEGATIVE
LEUKOCYTES UA: NEGATIVE
Nitrite: NEGATIVE
PH: 5.5 (ref 5.0–8.0)
RBC / HPF: NONE SEEN (ref 0–?)
Specific Gravity, Urine: 1.02 (ref 1.000–1.030)
Total Protein, Urine: 30 — AB
Urine Glucose: NEGATIVE
Urobilinogen, UA: 0.2 (ref 0.0–1.0)

## 2017-11-13 LAB — CBC WITH DIFFERENTIAL/PLATELET
BASOS PCT: 1.1 % (ref 0.0–3.0)
Basophils Absolute: 0.1 10*3/uL (ref 0.0–0.1)
EOS PCT: 2.5 % (ref 0.0–5.0)
Eosinophils Absolute: 0.2 10*3/uL (ref 0.0–0.7)
HCT: 42.1 % (ref 39.0–52.0)
Hemoglobin: 14 g/dL (ref 13.0–17.0)
Lymphocytes Relative: 21.4 % (ref 12.0–46.0)
Lymphs Abs: 1.5 10*3/uL (ref 0.7–4.0)
MCHC: 33.3 g/dL (ref 30.0–36.0)
MCV: 90.5 fl (ref 78.0–100.0)
MONO ABS: 0.7 10*3/uL (ref 0.1–1.0)
MONOS PCT: 9.2 % (ref 3.0–12.0)
NEUTROS PCT: 65.8 % (ref 43.0–77.0)
Neutro Abs: 4.7 10*3/uL (ref 1.4–7.7)
Platelets: 269 10*3/uL (ref 150.0–400.0)
RBC: 4.65 Mil/uL (ref 4.22–5.81)
RDW: 14 % (ref 11.5–15.5)
WBC: 7.1 10*3/uL (ref 4.0–10.5)

## 2017-11-13 LAB — HEMOGLOBIN A1C: Hgb A1c MFr Bld: 6.1 % (ref 4.6–6.5)

## 2017-11-13 LAB — HEPATIC FUNCTION PANEL
ALBUMIN: 4.1 g/dL (ref 3.5–5.2)
ALT: 16 U/L (ref 0–53)
AST: 15 U/L (ref 0–37)
Alkaline Phosphatase: 67 U/L (ref 39–117)
BILIRUBIN TOTAL: 0.4 mg/dL (ref 0.2–1.2)
Bilirubin, Direct: 0.1 mg/dL (ref 0.0–0.3)
Total Protein: 7.8 g/dL (ref 6.0–8.3)

## 2017-11-13 LAB — LIPID PANEL
CHOL/HDL RATIO: 3
Cholesterol: 131 mg/dL (ref 0–200)
HDL: 44.3 mg/dL (ref >39.00)
LDL Cholesterol: 56 mg/dL (ref 0–99)
NONHDL: 87.16
TRIGLYCERIDES: 157 mg/dL — AB (ref 0.0–149.0)
VLDL: 31.4 mg/dL (ref 0.0–40.0)

## 2017-11-13 LAB — BASIC METABOLIC PANEL
BUN: 10 mg/dL (ref 6–23)
CO2: 29 mEq/L (ref 19–32)
Calcium: 9.3 mg/dL (ref 8.4–10.5)
Chloride: 103 mEq/L (ref 96–112)
Creatinine, Ser: 0.32 mg/dL — ABNORMAL LOW (ref 0.40–1.50)
GFR: 289.9 mL/min (ref >60.00)
GLUCOSE: 127 mg/dL — AB (ref 70–99)
Potassium: 4.5 mEq/L (ref 3.5–5.1)
SODIUM: 139 meq/L (ref 135–145)

## 2017-11-13 LAB — PSA: PSA: 0.32 ng/mL (ref 0.10–4.00)

## 2017-11-13 LAB — TSH: TSH: 1.34 u[IU]/mL (ref 0.35–4.50)

## 2017-11-13 MED ORDER — METHYLPREDNISOLONE ACETATE 80 MG/ML IJ SUSP
80.0000 mg | Freq: Once | INTRAMUSCULAR | Status: AC
  Administered 2017-11-13: 80 mg via INTRAMUSCULAR

## 2017-11-13 MED ORDER — AMLODIPINE BESYLATE 10 MG PO TABS: 10.0000 mg | ORAL_TABLET | Freq: Every day | ORAL | 3 refills | Status: DC

## 2017-11-13 MED ORDER — ERYTHROMYCIN 5 MG/GM OP OINT: 1.0000 "application " | TOPICAL_OINTMENT | Freq: Four times a day (QID) | OPHTHALMIC | 0 refills | Status: AC

## 2017-11-13 NOTE — Patient Instructions (Addendum)
OK to stop the atenolol if you are taking it, and  OK to stop the amlodipine- benazepril 10-40 mg per day  You had the steroid shot today  Please take all new medication as prescribed - the amlodipine 10 mg only, and the eye antibiotic ointment  Please check your medicaitons when you get home and only take the medications on your list  Please continue all other medications as before, and refills have been done if requested.  Please have the pharmacy call with any other refills you may need.  Please continue your efforts at being more active, low cholesterol diet, and weight control.  You are otherwise up to date with prevention measures today.  Please keep your appointments with your specialists as you may have planned  Please continue all other medications as before, and refills have been done if requested.  Please have the pharmacy call with any other refills you may need.  Please continue your efforts at being more active, low cholesterol diet, and weight control.  You are otherwise up to date with prevention measures today.  Please keep your appointments with your specialists as you may have planned  Please go to the LAB in the Basement (turn left off the elevator) for the tests to be done today  You will be contacted by phone if any changes need to be made immediately.  Otherwise, you will receive a letter about your results with an explanation, but please check with MyChart first.  Please remember to sign up for MyChart if you have not done so, as this will be important to you in the future with finding out test results, communicating by private email, and scheduling acute appointments online when needed.  Please return in 3 months, or sooner if needed

## 2017-11-13 NOTE — Progress Notes (Signed)
Subjective:    Patient ID: Thomas Raymond, male    DOB: [birth year], 72 y.o.   MRN: [id number]  HPI  Here with interpretor c/o bilat upper lid swelling and itch as well as bilat eye itch and weepiness sometimes since yesterday.  Also some confused about his BP meds. No fever, vision change, Pt denies chest pain, increased sob or doe, wheezing, orthopnea, PND, increased LE swelling, palpitations, dizziness or syncope.  Pt denies new neurological symptoms such as new headache, or facial or extremity weakness or numbness   Pt denies polydipsia, polyuria  Past Medical History:  Diagnosis Date  . ALLERGIC RHINITIS   . ALS   . HYPERLIPIDEMIA   . HYPERTENSION   . Impaired glucose tolerance 02/17/2011  . LOW BACK PAIN   . VERTIGO    Past Surgical History:  Procedure Laterality Date  . COMPRESSION HIP SCREW Left 02/07/2014   Procedure: IM Nail;  Surgeon: Harvie Junior, MD;  Location: [facility];  Service: Orthopedics;  Laterality: Left;  . IR GENERIC HISTORICAL  11/21/2015   IR RADIOLOGIST EVAL & MGMT 11/21/2015 Oley Balm, MD [facility] INTER[facility] RAD  . Lipoma removal  2003  . Percutaneous gastrostomy tube repalcement     04/1999 and removed 07/1999  . TRACHEOSTOMY      reports that he has never smoked. He has never used smokeless tobacco. He reports that he does not drink alcohol or use drugs. family history includes Other in his father and mother; Other (age of onset: 2) in his daughter and son. No Known Allergies Current Outpatient Medications on File Prior to Visit  Medication Sig Dispense Refill  . acetaminophen (TYLENOL) 325 MG tablet Take 2 tablets (650 mg total) by mouth every 6 (six) hours as needed for mild pain (or temp > 100).    [name] albuterol (PROVENTIL HFA;VENTOLIN HFA) 108 (90 Base) MCG/ACT inhaler Inhale 2 puffs into the lungs every 6 (six) hours as needed for wheezing or shortness of breath. 1 Inhaler 2  . cetirizine (ZYRTEC) 10 MG tablet TAKE 1 TABLET BY MOUTH EVERY DAY 90 tablet 0  .  metoprolol succinate (TOPROL-XL) 50 MG 24 hr tablet TAKE 1 TABLET (50 MG TOTAL) BY MOUTH DAILY. TAKE WITH OR IMMEDIATELY FOLLOWING A MEAL. 90 tablet 3  . rosuvastatin (CRESTOR) 20 MG tablet TAKE 1 TABLET BY MOUTH EVERY DAY 90 tablet 0   No current facility-administered medications on file prior to visit.    Review of Systems  Constitutional: Negative for other unusual diaphoresis or sweats HENT: Negative for ear discharge or swelling Eyes: Negative for other worsening visual disturbances Respiratory: Negative for stridor or other swelling  Gastrointestinal: Negative for worsening distension or other blood Genitourinary: Negative for retention or other urinary change Musculoskeletal: Negative for other MSK pain or swelling Skin: Negative for color change or other new lesions Neurological: Negative for worsening tremors and other numbness  Psychiatric/Behavioral: Negative for worsening agitation or other fatigue All other system neg per pt    Objective:   Physical Exam BP 124/76   Pulse 64   Temp 97.6 F (36.4 C) (Oral)   Ht 5\' 1"  (1.549 m)   SpO2 97%   BMI 28.53 kg/m  VS noted,  Constitutional: Pt appears in NAD HENT: Head: NCAT.  Right Ear: External ear normal.  Left Ear: External ear normal.  Eyes: . Pupils are equal, round, and reactive to light. Conjunctivae with bilat erythema and EOM are normal, bilat upper lids 1+ swelling nontender no  erythema right > left Nose: without d/c or deformity Neck: Neck supple. Gross normal ROM Cardiovascular: Normal rate and regular rhythm.   Pulmonary/Chest: Effort normal and breath sounds without rales or wheezing.  Abd:  Soft, NT, ND, + BS, no organomegaly Neurological: Pt is alert. At baseline orientation, motor grossly intact Skin: Skin is warm. No rashes, other new lesions, no LE edema Psychiatric: Pt behavior is normal without agitation  No other exam findings Lab Results  Component Value Date   WBC 7.1 11/13/2017   HGB 14.0  11/13/2017   HCT 42.1 11/13/2017   PLT 269.0 11/13/2017   GLUCOSE 127 (H) 11/13/2017   CHOL 131 11/13/2017   TRIG 157.0 (H) 11/13/2017   HDL 44.30 11/13/2017   LDLDIRECT 126.4 09/22/2006   LDLCALC 56 11/13/2017   ALT 16 11/13/2017   AST 15 11/13/2017   NA 139 11/13/2017   K 4.5 11/13/2017   CL 103 11/13/2017   CREATININE 0.32 (L) 11/13/2017   BUN 10 11/13/2017   CO2 29 11/13/2017   TSH 1.34 11/13/2017   PSA 0.32 11/13/2017   INR 1.19 10/17/2015   HGBA1C 6.1 11/13/2017       Assessment & Plan:

## 2017-11-14 NOTE — Assessment & Plan Note (Signed)
stable overall by history and exam, recent data reviewed with pt, and pt to continue medical treatment as before,  to f/u any worsening symptoms or concerns Lab Results  Component Value Date   LDLCALC 56 11/13/2017

## 2017-11-14 NOTE — Assessment & Plan Note (Signed)
stable overall by history and exam, recent data reviewed with pt, and pt to continue medical treatment as before,  to f/u any worsening symptoms or concerns Lab Results  Component Value Date   HGBA1C 6.1 11/13/2017   

## 2017-11-14 NOTE — Assessment & Plan Note (Signed)
stable overall by history and exam, recent data reviewed with pt, and pt to continue medical treatment as before,  to f/u any worsening symptoms or concerns BP Readings from Last 3 Encounters:  11/13/17 124/76  05/16/17 136/86  09/19/16 140/86

## 2017-11-14 NOTE — Assessment & Plan Note (Signed)
With bilat blepharitis as well, etiology possible allergic (drug or other) vs infectious ; will need to d/c lotrel due to ACE use, depomedrol IM 80 today and benadryl up to 50 mg every 6 hrs as needed; also given eryth op ointment for bilat conjunctivitis as well

## 2017-12-30 ENCOUNTER — Other Ambulatory Visit: Payer: Self-pay | Admitting: Internal Medicine

## 2018-01-27 ENCOUNTER — Other Ambulatory Visit: Payer: Self-pay | Admitting: Internal Medicine

## 2018-02-25 IMAGING — CT CT ABD-PELV W/ CM
2 of 5 series · 16 of 46 positions shown, 18 images · IV contrast (iopamidol)
Comparison: CT scan dated 07/18/2013

CLINICAL DATA: Left lower quadrant pain after eating lunch today.

EXAM:
CT ABDOMEN AND PELVIS WITH CONTRAST
TECHNIQUE: Multidetector CT imaging of the abdomen and pelvis was performed
using the standard protocol following bolus administration of
intravenous contrast.
CONTRAST:  100mL BKVQXY-TBB IOPAMIDOL (BKVQXY-TBB) INJECTION 61%

[Series 2: abd/pel with · axial · 0.83mm/px · z∈[-725,-325]mm · 13 of 94 slices shown, 15 images]
[im 7/94  soft-tissue]
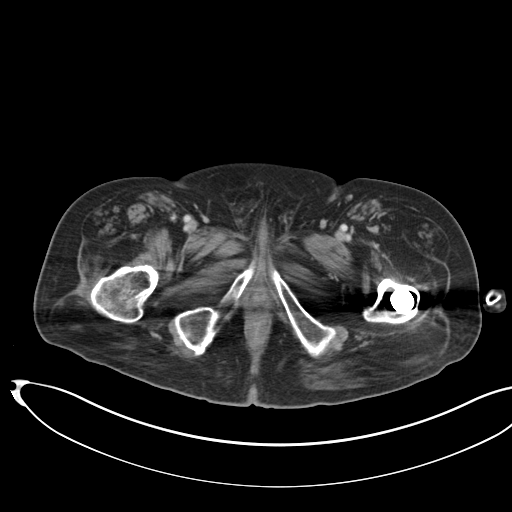
[im 7/94  bone]
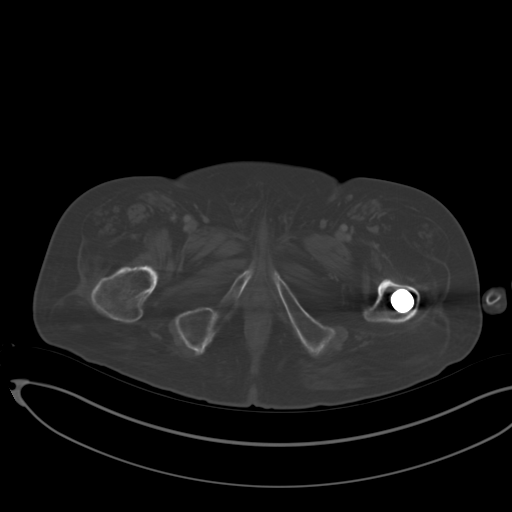
[im 14/94  soft-tissue]
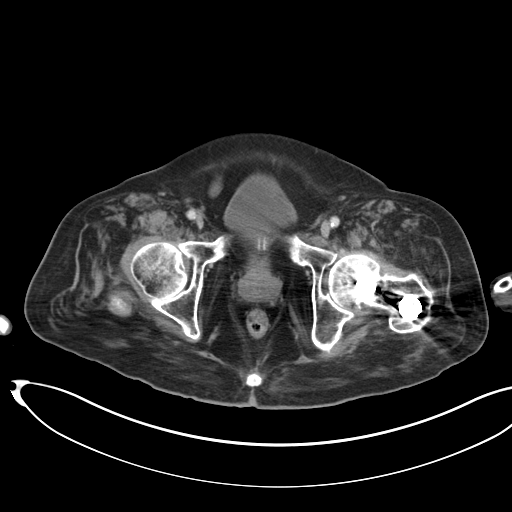
[im 20/94  soft-tissue]
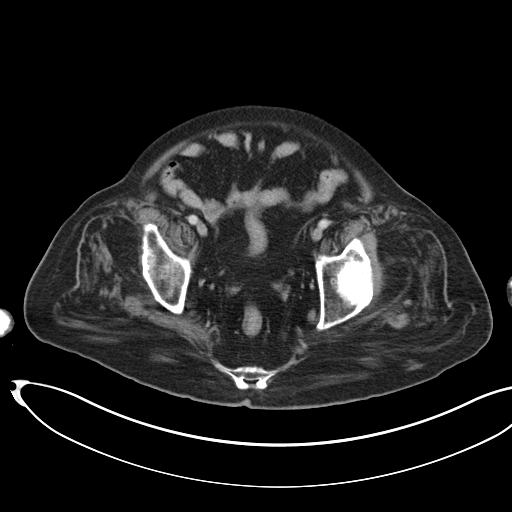
[im 27/94  soft-tissue]
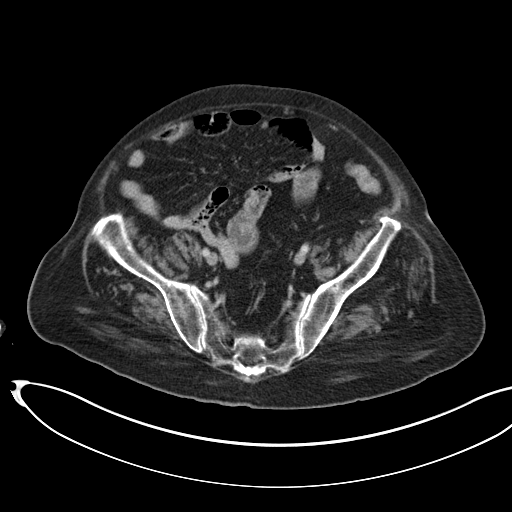
[im 34/94  soft-tissue]
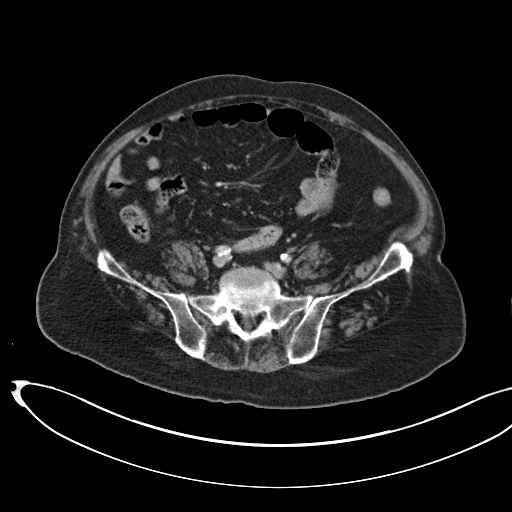
[im 40/94  soft-tissue]
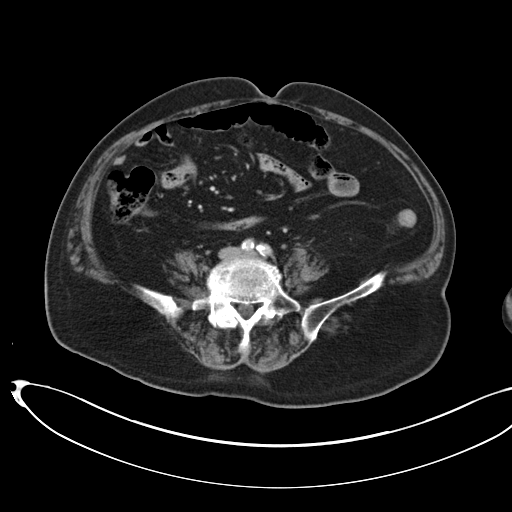
[im 47/94  soft-tissue]
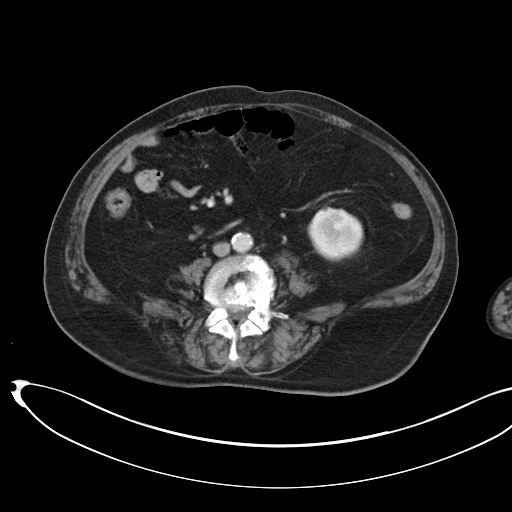
[im 54/94  soft-tissue]
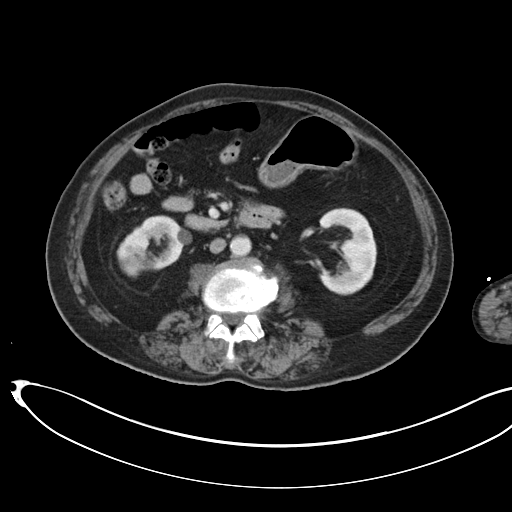
[im 60/94  soft-tissue]
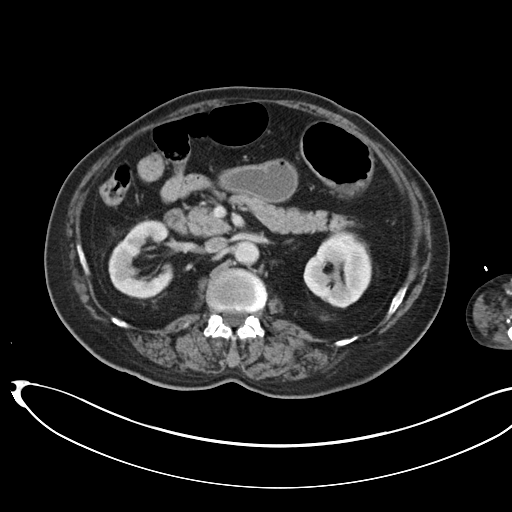
[im 60/94  bone]
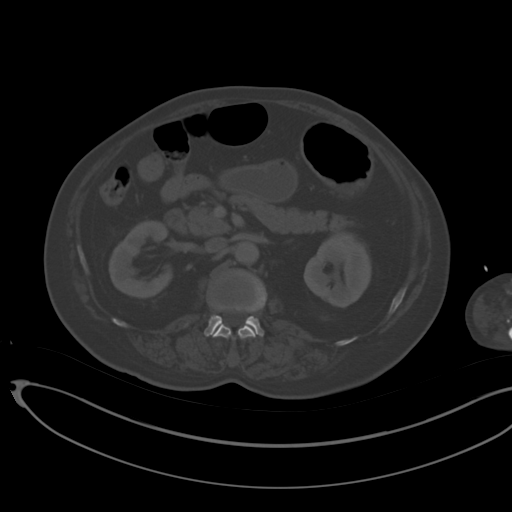
[im 67/94  soft-tissue]
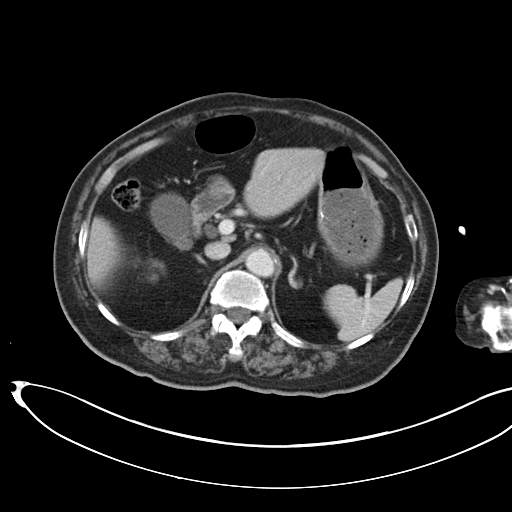
[im 74/94  soft-tissue]
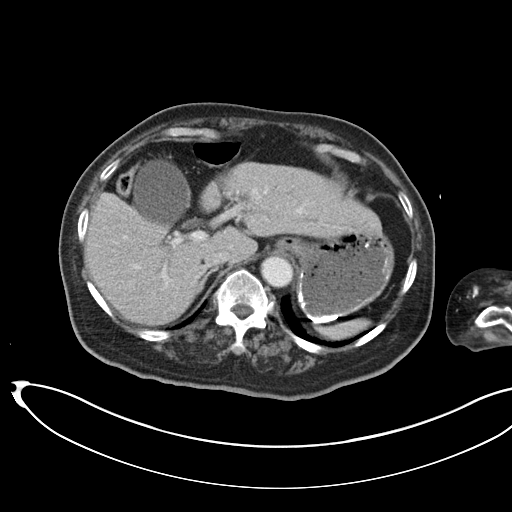
[im 80/94  soft-tissue]
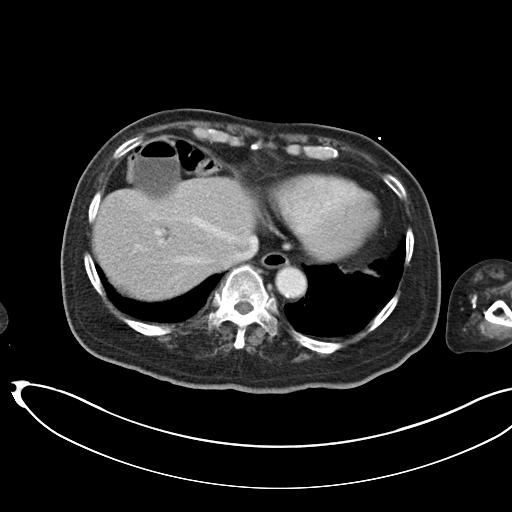
[im 87/94  soft-tissue]
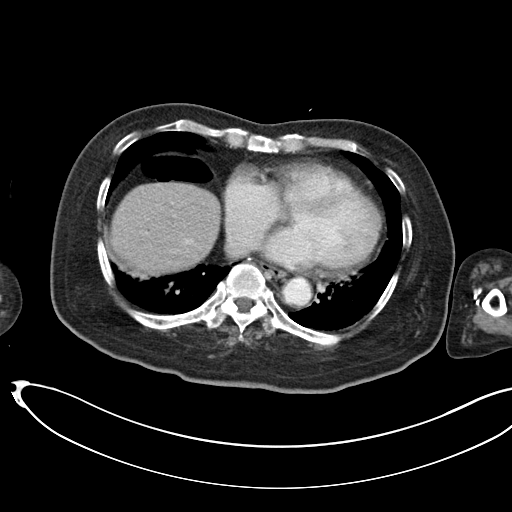

[Series 4: coronal a/|p · coronal · 0.74mm/px · 3 of 176 slices shown]
[im 59/176  soft-tissue]
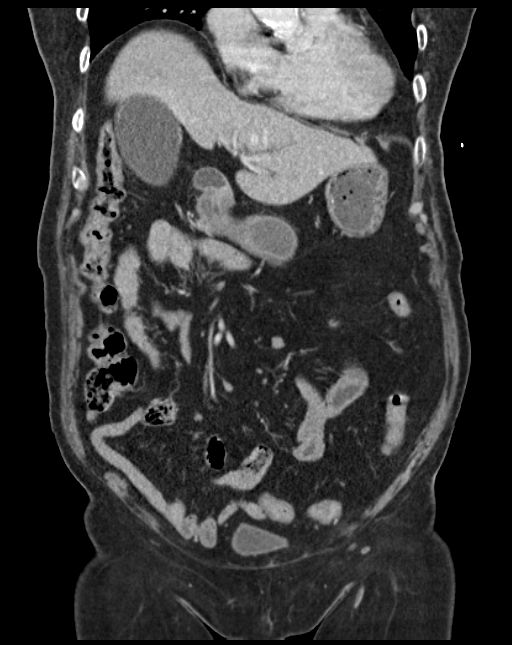
[im 78/176  soft-tissue]
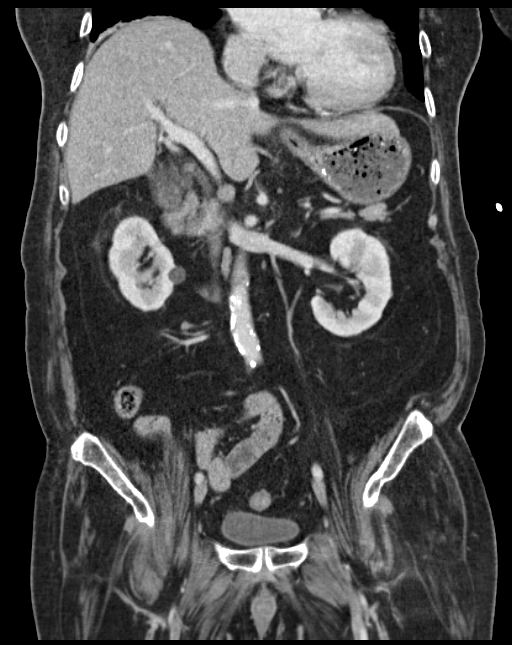
[im 98/176  soft-tissue]
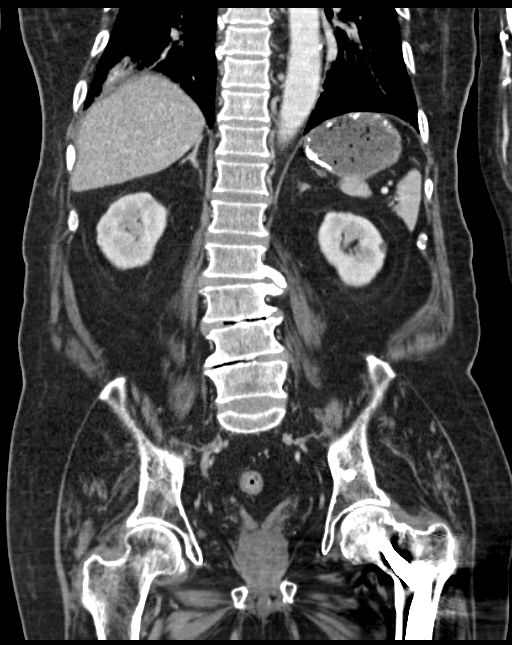

[16 of 46 positions shown; findings below may reference images not displayed]

FINDINGS: Lower chest:  No acute findings.

Hepatobiliary: There is air in the gallbladder. No air in the bile
ducts. There is slight chronic prominence of the intra and
extrahepatic bile ducts which is stable. Slight chronic thickening
of the gallbladder wall. No air within the wall of the gallbladder.
No liver masses.

Pancreas: No mass, inflammatory changes, or other significant
abnormality.

Spleen: Within normal limits in size and appearance.

Adrenals/Urinary Tract: Normal adrenal glands. Three small cysts in
the right kidney, unchanged, the largest being 15 mm in the lower
pole. 2 tiny cysts in the left kidney. No hydronephrosis. Bladder is
normal.

Stomach/Bowel: The bowel appears normal including the terminal ileum
and appendix. No diverticular disease.

Vascular/Lymphatic: Aortic atherosclerosis.  No adenopathy.

Reproductive: Normal.

Other: No free air or free fluid.

Musculoskeletal: Severe atrophy of paraspinal muscles, pelvic
muscles, and buttock and proximal thigh muscles. Chronic
degenerative disc and joint disease in the lumbar spine. Intra
medullary nail and compression screw in the proximal left femur.
IMPRESSION: 1. New air in the gallbladder with chronic thickening of the
gallbladder wall. This could represent emphysematous cholecystitis.
2. No other significant change since the prior exam.
3. Severe atrophy of the muscles of the pelvis, paraspinal muscles
and proximal thigh muscles.

## 2018-02-28 ENCOUNTER — Other Ambulatory Visit: Payer: Self-pay | Admitting: Internal Medicine

## 2018-03-06 ENCOUNTER — Other Ambulatory Visit: Payer: Self-pay | Admitting: Internal Medicine

## 2018-04-28 ENCOUNTER — Encounter: Payer: Self-pay | Admitting: Internal Medicine

## 2018-04-28 ENCOUNTER — Ambulatory Visit (INDEPENDENT_AMBULATORY_CARE_PROVIDER_SITE_OTHER): Payer: Medicare HMO | Admitting: Internal Medicine

## 2018-04-28 VITALS — BP 124/68 | HR 72 | Ht 61.0 in

## 2018-04-28 DIAGNOSIS — Z0000 Encounter for general adult medical examination without abnormal findings: Secondary | ICD-10-CM | POA: Diagnosis not present

## 2018-04-28 DIAGNOSIS — Z23 Encounter for immunization: Secondary | ICD-10-CM

## 2018-04-28 DIAGNOSIS — R7302 Impaired glucose tolerance (oral): Secondary | ICD-10-CM | POA: Diagnosis not present

## 2018-04-28 LAB — POCT GLYCOSYLATED HEMOGLOBIN (HGB A1C): HEMOGLOBIN A1C: 6.1 % — AB (ref 4.0–5.6)

## 2018-04-28 MED ORDER — METOPROLOL SUCCINATE ER 50 MG PO TB24: 50.0000 mg | ORAL_TABLET | Freq: Every day | ORAL | 3 refills | Status: DC

## 2018-04-28 MED ORDER — MECLIZINE HCL 12.5 MG PO TABS: 12.5000 mg | ORAL_TABLET | Freq: Three times a day (TID) | ORAL | 2 refills | Status: DC | PRN

## 2018-04-28 MED ORDER — ROSUVASTATIN CALCIUM 20 MG PO TABS: 20.0000 mg | ORAL_TABLET | Freq: Every day | ORAL | 3 refills | Status: DC

## 2018-04-28 MED ORDER — OMEPRAZOLE 20 MG PO CPDR: 20.0000 mg | DELAYED_RELEASE_CAPSULE | Freq: Every day | ORAL | 3 refills | Status: DC

## 2018-04-28 MED ORDER — AMLODIPINE BESYLATE 10 MG PO TABS: 10.0000 mg | ORAL_TABLET | Freq: Every day | ORAL | 3 refills | Status: DC

## 2018-04-28 MED ORDER — CETIRIZINE HCL 10 MG PO TABS: 10.0000 mg | ORAL_TABLET | Freq: Every day | ORAL | 3 refills | Status: DC

## 2018-04-28 NOTE — Patient Instructions (Addendum)
You had the flu shot today  Your A1c was OK today  Please continue all other medications as before, and refills have been done if requested.  Please have the pharmacy call with any other refills you may need.  Please continue your efforts at being more active, low cholesterol diet, and weight control.  You are otherwise up to date with prevention measures today.  Please keep your appointments with your specialists as you may have planned  Please return in 6 months, or sooner if needed 

## 2018-04-28 NOTE — Progress Notes (Signed)
Subjective:    Patient ID: Thomas Raymond, male    DOB: [birth year], 73 y.o.   MRN: [id number]  HPI  Here for wellness and f/u;  Overall doing ok;  Pt denies Chest pain, worsening SOB, DOE, wheezing, orthopnea, PND, worsening LE edema, palpitations, dizziness or syncope.  Pt denies neurological change such as new headache, facial or extremity weakness.  Pt denies polydipsia, polyuria, or low sugar symptoms. Pt states overall good compliance with treatment and medications, good tolerability, and has been trying to follow appropriate diet.  Pt denies worsening depressive symptoms, suicidal ideation or panic. No fever, night sweats, wt loss, loss of appetite, or other constitutional symptoms.  Pt states good ability with ADL's, has low fall risk, home safety reviewed and adequate, no other significant changes in hearing or vision.  Does have occasional positional vertigo and asks for meclizine Past Medical History:  Diagnosis Date  . ALLERGIC RHINITIS   . ALS   . HYPERLIPIDEMIA   . HYPERTENSION   . Impaired glucose tolerance 02/17/2011  . LOW BACK PAIN   . VERTIGO    Past Surgical History:  Procedure Laterality Date  . COMPRESSION HIP SCREW Left 02/07/2014   Procedure: IM Nail;  Surgeon: Harvie Junior, MD;  Location: [facility];  Service: Orthopedics;  Laterality: Left;  . IR GENERIC HISTORICAL  11/21/2015   IR RADIOLOGIST EVAL & MGMT 11/21/2015 Oley Balm, MD [facility] INTERV RAD  . Lipoma removal  2003  . Percutaneous gastrostomy tube repalcement     04/1999 and removed 07/1999  . TRACHEOSTOMY      reports that he has never smoked. He has never used smokeless tobacco. He reports that he does not drink alcohol or use drugs. family history includes Other in his father and mother; Other (age of onset: 2) in his daughter and son. No Known Allergies Current Outpatient Medications on File Prior to Visit  Medication Sig Dispense Refill  . acetaminophen (TYLENOL) 325 MG tablet Take 2 tablets (650 mg total)  by mouth every 6 (six) hours as needed for mild pain (or temp > 100).    [name] albuterol (PROVENTIL HFA;VENTOLIN HFA) 108 (90 Base) MCG/ACT inhaler Inhale 2 puffs into the lungs every 6 (six) hours as needed for wheezing or shortness of breath. 1 Inhaler 2   No current facility-administered medications on file prior to visit.    Review of Systems Constitutional: Negative for other unusual diaphoresis, sweats, appetite or weight changes HENT: Negative for other worsening hearing loss, ear pain, facial swelling, mouth sores or neck stiffness.   Eyes: Negative for other worsening pain, redness or other visual disturbance.  Respiratory: Negative for other stridor or swelling Cardiovascular: Negative for other palpitations or other chest pain  Gastrointestinal: Negative for worsening diarrhea or loose stools, blood in stool, distention or other pain Genitourinary: Negative for hematuria, flank pain or other change in urine volume.  Musculoskeletal: Negative for myalgias or other joint swelling.  Skin: Negative for other color change, or other wound or worsening drainage.  Neurological: Negative for other syncope or numbness. Hematological: Negative for other adenopathy or swelling Psychiatric/Behavioral: Negative for hallucinations, other worsening agitation, SI, self-injury, or new decreased concentration All other system neg per pt    Objective:   Physical Exam BP 124/68   Pulse 72   Ht 5\' 1"  (1.549 m)   SpO2 98%   BMI 28.53 kg/m  VS noted, wheelchair bound Constitutional: Pt is oriented to person, place, and time. Appears well-developed and  well-nourished, in no significant distress and comfortable Head: Normocephalic and atraumatic  Eyes: Conjunctivae and EOM are normal. Pupils are equal, round, and reactive to light Right Ear: External ear normal without discharge Left Ear: External ear normal without discharge Nose: Nose without discharge or deformity Mouth/Throat: Oropharynx is  without other ulcerations and moist  Neck: Normal range of motion. Neck supple. No JVD present. No tracheal deviation present or significant neck LA or mass Cardiovascular: Normal rate, regular rhythm, normal heart sounds and intact distal pulses.   Pulmonary/Chest: WOB normal and breath sounds without rales or wheezing  Abdominal: Soft. Bowel sounds are normal. NT. No HSM  Musculoskeletal: Normal range of motion. Exhibits no edema Lymphadenopathy: Has no other cervical adenopathy.  Neurological: Pt is alert and oriented to person, place, and time. Pt has normal reflexes. No cranial nerve deficit, o/w not done in detail Skin: Skin is warm and dry. No rash noted or new ulcerations Psychiatric:  Has normal mood and affect. Behavior is normal without agitation No other exam findings  Lab Results  Component Value Date   WBC 7.1 11/13/2017   HGB 14.0 11/13/2017   HCT 42.1 11/13/2017   PLT 269.0 11/13/2017   GLUCOSE 127 (H) 11/13/2017   CHOL 131 11/13/2017   TRIG 157.0 (H) 11/13/2017   HDL 44.30 11/13/2017   LDLDIRECT 126.4 09/22/2006   LDLCALC 56 11/13/2017   ALT 16 11/13/2017   AST 15 11/13/2017   NA 139 11/13/2017   K 4.5 11/13/2017   CL 103 11/13/2017   CREATININE 0.32 (L) 11/13/2017   BUN 10 11/13/2017   CO2 29 11/13/2017   TSH 1.34 11/13/2017   PSA 0.32 11/13/2017   INR 1.19 10/17/2015   HGBA1C 6.1 11/13/2017   Declines other lab today  POCT glycosylated hemoglobin (Hb A1C)  Order: [id number]  Status:  Final result Visible to patient:  No (Not Released) Dx:  Impaired glucose tolerance   Ref Range & Units 13:42 84mo ago 3262yr ago 40267yr ago 413yr ago 8843yr ago 7167yr ago  Hemoglobin A1C 4.0 - 5.6 % 6.1Abnormal   6.1 R, CM 6.1 R, CM 6.2 R, CM 6.2 R, CM 6.1 R, CM 5.2 R, CM           Assessment & Plan:

## 2018-04-28 NOTE — Assessment & Plan Note (Signed)
stable overall by history and exam, recent data reviewed with pt, and pt to continue medical treatment as before,  to f/u any worsening symptoms or concerns  

## 2018-04-28 NOTE — Assessment & Plan Note (Signed)

## 2018-08-10 ENCOUNTER — Other Ambulatory Visit: Payer: Self-pay | Admitting: Internal Medicine

## 2019-04-06 ENCOUNTER — Other Ambulatory Visit: Payer: Self-pay | Admitting: Internal Medicine

## 2019-05-03 ENCOUNTER — Other Ambulatory Visit: Payer: Self-pay | Admitting: Internal Medicine

## 2019-05-07 ENCOUNTER — Other Ambulatory Visit: Payer: Self-pay | Admitting: Internal Medicine

## 2019-07-01 ENCOUNTER — Other Ambulatory Visit: Payer: Self-pay | Admitting: Internal Medicine

## 2019-07-05 ENCOUNTER — Other Ambulatory Visit: Payer: Self-pay | Admitting: Internal Medicine

## 2019-07-05 ENCOUNTER — Other Ambulatory Visit: Payer: Self-pay

## 2019-07-05 MED ORDER — AMLODIPINE BESYLATE 10 MG PO TABS: 10.0000 mg | ORAL_TABLET | Freq: Every day | ORAL | 0 refills | Status: DC

## 2019-07-05 NOTE — Telephone Encounter (Signed)
Please refill as per office routine med refill policy (all routine meds refilled for 3 mo or monthly per pt preference up to one year from last visit, then month to month grace period for 3 mo, then further med refills will have to be denied)  

## 2019-07-26 ENCOUNTER — Other Ambulatory Visit: Payer: Self-pay | Admitting: Internal Medicine

## 2019-07-28 ENCOUNTER — Other Ambulatory Visit: Payer: Self-pay | Admitting: Internal Medicine

## 2019-07-31 ENCOUNTER — Other Ambulatory Visit: Payer: Self-pay | Admitting: Internal Medicine

## 2019-08-01 NOTE — Telephone Encounter (Signed)
Please refill as per office routine med refill policy (all routine meds refilled for 3 mo or monthly per pt preference up to one year from last visit, then month to month grace period for 3 mo, then further med refills will have to be denied)  

## 2019-08-27 ENCOUNTER — Encounter: Payer: Self-pay | Admitting: Internal Medicine

## 2019-08-27 ENCOUNTER — Other Ambulatory Visit: Payer: Self-pay

## 2019-08-27 ENCOUNTER — Ambulatory Visit (INDEPENDENT_AMBULATORY_CARE_PROVIDER_SITE_OTHER): Payer: Medicare HMO | Admitting: Internal Medicine

## 2019-08-27 VITALS — BP 120/80 | HR 50 | Temp 98.7°F | Ht 61.0 in

## 2019-08-27 DIAGNOSIS — Z0000 Encounter for general adult medical examination without abnormal findings: Secondary | ICD-10-CM

## 2019-08-27 DIAGNOSIS — R7302 Impaired glucose tolerance (oral): Secondary | ICD-10-CM

## 2019-08-27 DIAGNOSIS — E538 Deficiency of other specified B group vitamins: Secondary | ICD-10-CM

## 2019-08-27 DIAGNOSIS — E559 Vitamin D deficiency, unspecified: Secondary | ICD-10-CM | POA: Diagnosis not present

## 2019-08-27 LAB — LIPID PANEL
Cholesterol: 146 mg/dL (ref 0–200)
HDL: 55.4 mg/dL (ref >39.00)
LDL Cholesterol: 75 mg/dL (ref 0–99)
NonHDL: 90.46
Total CHOL/HDL Ratio: 3
Triglycerides: 75 mg/dL (ref 0.0–149.0)
VLDL: 15 mg/dL (ref 0.0–40.0)

## 2019-08-27 LAB — CBC WITH DIFFERENTIAL/PLATELET
Basophils Absolute: 0.1 10*3/uL (ref 0.0–0.1)
Basophils Relative: 0.9 % (ref 0.0–3.0)
Eosinophils Absolute: 0.1 10*3/uL (ref 0.0–0.7)
Eosinophils Relative: 1.6 % (ref 0.0–5.0)
HCT: 43.4 % (ref 39.0–52.0)
Hemoglobin: 14.5 g/dL (ref 13.0–17.0)
Lymphocytes Relative: 21.7 % (ref 12.0–46.0)
Lymphs Abs: 1.5 10*3/uL (ref 0.7–4.0)
MCHC: 33.5 g/dL (ref 30.0–36.0)
MCV: 90.2 fl (ref 78.0–100.0)
Monocytes Absolute: 0.7 10*3/uL (ref 0.1–1.0)
Monocytes Relative: 10 % (ref 3.0–12.0)
Neutro Abs: 4.5 10*3/uL (ref 1.4–7.7)
Neutrophils Relative %: 65.8 % (ref 43.0–77.0)
Platelets: 256 10*3/uL (ref 150.0–400.0)
RBC: 4.81 Mil/uL (ref 4.22–5.81)
RDW: 14.6 % (ref 11.5–15.5)
WBC: 6.8 10*3/uL (ref 4.0–10.5)

## 2019-08-27 LAB — HEMOGLOBIN A1C: Hgb A1c MFr Bld: 5.8 % (ref 4.6–6.5)

## 2019-08-27 LAB — URINALYSIS, ROUTINE W REFLEX MICROSCOPIC
Bilirubin Urine: NEGATIVE
Hgb urine dipstick: NEGATIVE
Ketones, ur: NEGATIVE
Leukocytes,Ua: NEGATIVE
Nitrite: NEGATIVE
Specific Gravity, Urine: 1.02 (ref 1.000–1.030)
Total Protein, Urine: 30 — AB
Urine Glucose: NEGATIVE
Urobilinogen, UA: 0.2 (ref 0.0–1.0)
pH: 7.5 (ref 5.0–8.0)

## 2019-08-27 LAB — BASIC METABOLIC PANEL
BUN: 13 mg/dL (ref 6–23)
CO2: 32 mEq/L (ref 19–32)
Calcium: 9.7 mg/dL (ref 8.4–10.5)
Chloride: 103 mEq/L (ref 96–112)
Creatinine, Ser: 0.42 mg/dL (ref 0.40–1.50)
GFR: 198.32 mL/min (ref >60.00)
Glucose, Bld: 90 mg/dL (ref 70–99)
Potassium: 4 mEq/L (ref 3.5–5.1)
Sodium: 140 mEq/L (ref 135–145)

## 2019-08-27 LAB — HEPATIC FUNCTION PANEL
ALT: 26 U/L (ref 0–53)
AST: 26 U/L (ref 0–37)
Albumin: 4.5 g/dL (ref 3.5–5.2)
Alkaline Phosphatase: 79 U/L (ref 39–117)
Bilirubin, Direct: 0.1 mg/dL (ref 0.0–0.3)
Total Bilirubin: 0.4 mg/dL (ref 0.2–1.2)
Total Protein: 8.3 g/dL (ref 6.0–8.3)

## 2019-08-27 LAB — TSH: TSH: 1.5 u[IU]/mL (ref 0.35–4.50)

## 2019-08-27 LAB — VITAMIN B12: Vitamin B-12: 626 pg/mL (ref 211–911)

## 2019-08-27 LAB — VITAMIN D 25 HYDROXY (VIT D DEFICIENCY, FRACTURES): VITD: 40.29 ng/mL (ref 30.00–100.00)

## 2019-08-27 LAB — PSA: PSA: 0.26 ng/mL (ref 0.10–4.00)

## 2019-08-27 NOTE — Progress Notes (Signed)
Subjective:    Patient ID: Thomas Raymond, male    DOB: Aug 26, 1945, 74 y.o.   MRN: [id number]  HPI  Here for wellness and f/u;  Overall doing ok;  Pt denies Chest pain, worsening SOB, DOE, wheezing, orthopnea, PND, worsening LE edema, palpitations, dizziness or syncope.  Pt denies neurological change such as new headache, facial or extremity weakness.  Pt denies polydipsia, polyuria, or low sugar symptoms. Pt states overall good compliance with treatment and medications, good tolerability, and has been trying to follow appropriate diet.  Pt denies worsening depressive symptoms, suicidal ideation or panic. No fever, night sweats, wt loss, loss of appetite, or other constitutional symptoms.  Pt states good ability with ADL's, has low fall risk, home safety reviewed and adequate, no other significant changes in hearing or vision, and only occasionally active with exercise.   Past Medical History:  Diagnosis Date  . ALLERGIC RHINITIS   . ALS   . HYPERLIPIDEMIA   . HYPERTENSION   . Impaired glucose tolerance 02/17/2011  . LOW BACK PAIN   . VERTIGO    Past Surgical History:  Procedure Laterality Date  . COMPRESSION HIP SCREW Left 02/07/2014   Procedure: IM Nail;  Surgeon: Harvie Junior, MD;  Location: [facility];  Service: Orthopedics;  Laterality: Left;  . IR GENERIC HISTORICAL  11/21/2015   IR RADIOLOGIST EVAL & MGMT 11/21/2015 Oley Balm, MD [facility] INTERV RAD  . Lipoma removal  2003  . Percutaneous gastrostomy tube repalcement     04/1999 and removed 07/1999  . TRACHEOSTOMY      reports that he has never smoked. He has never used smokeless tobacco. He reports that he does not drink alcohol or use drugs. family history includes Other in his father and mother; Other (age of onset: 2) in his daughter and son. No Known Allergies Current Outpatient Medications on File Prior to Visit  Medication Sig Dispense Refill  . acetaminophen (TYLENOL) 325 MG tablet Take 2 tablets (650 mg total) by mouth every 6  (six) hours as needed for mild pain (or temp > 100).    [name] albuterol (PROVENTIL HFA;VENTOLIN HFA) 108 (90 Base) MCG/ACT inhaler Inhale 2 puffs into the lungs every 6 (six) hours as needed for wheezing or shortness of breath. 1 Inhaler 2  . amLODipine (NORVASC) 10 MG tablet Take 1 tablet (10 mg total) by mouth daily. *this patient has to be seen for ANY future refills 30 tablet 0  . cetirizine (ZYRTEC) 10 MG tablet TAKE 1 TABLET BY MOUTH EVERY DAY 90 tablet 0  . meclizine (ANTIVERT) 12.5 MG tablet TAKE 1 TABLET BY MOUTH 3 TIMES A DAY AS NEEDED FOR DIZZINESS 60 tablet 2  . metoprolol succinate (TOPROL-XL) 50 MG 24 hr tablet Take 1 tablet (50 mg total) by mouth daily. 90 tablet 3  . omeprazole (PRILOSEC) 20 MG capsule TAKE 1 CAPSULE BY MOUTH EVERY DAY 90 capsule 0  . rosuvastatin (CRESTOR) 20 MG tablet TAKE 1 TABLET BY MOUTH EVERY DAY 90 tablet 0   No current facility-administered medications on file prior to visit.   Review of Systems All otherwise neg per pt     Objective:   Physical Exam BP 120/80 (BP Location: Left Arm, Patient Position: Sitting, Cuff Size: Small)   Pulse (!) 50   Temp 98.7 F (37.1 C) (Oral)   Ht 5\' 1"  (1.549 m)   SpO2 99%   BMI 28.53 kg/m  VS noted,  Constitutional: Pt appears in NAD HENT: Head:  NCAT.  Right Ear: External ear normal.  Left Ear: External ear normal.  Eyes: . Pupils are equal, round, and reactive to light. Conjunctivae and EOM are normal Nose: without d/c or deformity Neck: Neck supple. Gross normal ROM Cardiovascular: Normal rate and regular rhythm.   Pulmonary/Chest: Effort normal and breath sounds without rales or wheezing.  Abd:  Soft, NT, ND, + BS, no organomegaly Neurological: Pt is alert. At baseline orientation, o/w not done in detail Skin: Skin is warm. No rashes, other new lesions, no LE edema Psychiatric: Pt behavior is normal without agitation  All otherwise neg per pt  Lab Results  Component Value Date   WBC 6.8 08/27/2019    HGB 14.5 08/27/2019   HCT 43.4 08/27/2019   PLT 256.0 08/27/2019   GLUCOSE 90 08/27/2019   CHOL 146 08/27/2019   TRIG 75.0 08/27/2019   HDL 55.40 08/27/2019   LDLDIRECT 126.4 09/22/2006   LDLCALC 75 08/27/2019   ALT 26 08/27/2019   AST 26 08/27/2019   NA 140 08/27/2019   K 4.0 08/27/2019   CL 103 08/27/2019   CREATININE 0.42 08/27/2019   BUN 13 08/27/2019   CO2 32 08/27/2019   TSH 1.50 08/27/2019   PSA 0.26 08/27/2019   INR 1.19 10/17/2015   HGBA1C 5.8 08/27/2019      Assessment & Plan:

## 2019-08-27 NOTE — Patient Instructions (Signed)

## 2019-08-28 ENCOUNTER — Encounter: Payer: Self-pay | Admitting: Internal Medicine

## 2019-08-28 NOTE — Assessment & Plan Note (Signed)
stable overall by history and exam, recent data reviewed with pt, and pt to continue medical treatment as before,  to f/u any worsening symptoms or concerns  

## 2019-08-28 NOTE — Assessment & Plan Note (Signed)

## 2019-08-31 ENCOUNTER — Other Ambulatory Visit: Payer: Self-pay | Admitting: Internal Medicine

## 2019-08-31 MED ORDER — AMLODIPINE BESYLATE 10 MG PO TABS: 10.0000 mg | ORAL_TABLET | Freq: Every day | ORAL | 3 refills | Status: DC

## 2019-08-31 NOTE — Telephone Encounter (Signed)
Please refill as per office routine med refill policy (all routine meds refilled for 3 mo or monthly per pt preference up to one year from last visit, then month to month grace period for 3 mo, then further med refills will have to be denied)  

## 2019-08-31 NOTE — Telephone Encounter (Signed)
New message:   1.Medication Requested: amLODipine (NORVASC) 10 MG tablet 2. Pharmacy (Name, Street, [place]): [facility] [phone] - [place], [place] - [address] 3. On Med List: Yes  4. Last Visit with PCP: 08/27/19  5. Next visit date with PCP: None   Agent: Please be advised that RX refills may take up to 3 business days. We ask that you follow-up with your pharmacy.

## 2019-08-31 NOTE — Telephone Encounter (Signed)
Reviewed chart pt is up-to-date sent refills to [facility]  

## 2019-09-09 ENCOUNTER — Other Ambulatory Visit: Payer: Self-pay | Admitting: Internal Medicine

## 2019-09-09 MED ORDER — AMLODIPINE BESYLATE 10 MG PO TABS: 10.0000 mg | ORAL_TABLET | Freq: Every day | ORAL | 3 refills | Status: DC

## 2019-09-09 NOTE — Telephone Encounter (Signed)
    1.Medication Requested:amLODipine (NORVASC) 10 MG tablet  2. Pharmacy (Name, Street, [place]): [facility]  3. On Med List: YES  4. Last Visit with PCP: 08/27/19  5. Next visit date with PCP:   Agent: Please be advised that RX refills may take up to 3 business days. We ask that you follow-up with your pharmacy.

## 2019-09-09 NOTE — Telephone Encounter (Signed)
Reviewed chart pt is up-to-date [facility]ills [facility]  

## 2019-10-11 ENCOUNTER — Telehealth: Payer: Self-pay | Admitting: Internal Medicine

## 2019-10-11 NOTE — Telephone Encounter (Signed)
Pt called and is requesting a handicap placard. Would like a call once this is completed. [phone]

## 2019-10-15 ENCOUNTER — Telehealth: Payer: Self-pay | Admitting: Internal Medicine

## 2019-10-15 ENCOUNTER — Other Ambulatory Visit: Payer: Self-pay | Admitting: Internal Medicine

## 2019-10-15 NOTE — Telephone Encounter (Signed)
Oh yes, as he has ALS in a wheelchair for many years

## 2019-10-15 NOTE — Telephone Encounter (Signed)
Okay to complete form 

## 2019-10-15 NOTE — Telephone Encounter (Signed)
New message:    1.Medication Requested: omeprazole (PRILOSEC) 20 MG capsule rosuvastatin (CRESTOR) 20 MG tablet 2. Pharmacy (Name, Street, [place]): [phone] [place], [place] - [address] 3. On Med List: Yes  4. Last Visit with PCP: 08/27/19  5. Next visit date with PCP: none   Agent: Please be advised that RX refills may take up to 3 business days. We ask that you follow-up with your pharmacy.

## 2019-10-15 NOTE — Telephone Encounter (Signed)
Please refill as per office routine med refill policy (all routine meds refilled for 3 mo or monthly per pt preference up to one year from last visit, then month to month grace period for 3 mo, then further med refills will have to be denied)  

## 2019-10-18 ENCOUNTER — Other Ambulatory Visit: Payer: Self-pay | Admitting: Internal Medicine

## 2019-10-18 MED ORDER — ROSUVASTATIN CALCIUM 20 MG PO TABS: 20.0000 mg | ORAL_TABLET | Freq: Every day | ORAL | 1 refills | Status: DC

## 2019-10-18 MED ORDER — OMEPRAZOLE 20 MG PO CPDR: 20.0000 mg | DELAYED_RELEASE_CAPSULE | Freq: Every day | ORAL | 1 refills | Status: DC

## 2019-10-18 NOTE — Telephone Encounter (Signed)
erx has been sent.  

## 2019-10-18 NOTE — Telephone Encounter (Signed)
Form completed and placed in mail

## 2019-10-22 ENCOUNTER — Telehealth: Payer: Self-pay | Admitting: Internal Medicine

## 2019-10-22 NOTE — Telephone Encounter (Signed)
Patients daughter dropped off transportation paper work 10/22/2019. Placed in Brittany's box.

## 2019-10-26 NOTE — Telephone Encounter (Signed)
Forms have been completed & Placed in providers box to review and sign.  

## 2019-10-27 NOTE — Telephone Encounter (Signed)
Forms has been signed, Copy sent to scan.  Daughter informed and original mailed as requested.

## 2019-10-30 ENCOUNTER — Other Ambulatory Visit: Payer: Self-pay | Admitting: Internal Medicine

## 2019-11-11 ENCOUNTER — Encounter: Payer: Self-pay | Admitting: Internal Medicine

## 2019-11-11 ENCOUNTER — Ambulatory Visit (INDEPENDENT_AMBULATORY_CARE_PROVIDER_SITE_OTHER): Payer: Medicare HMO | Admitting: Internal Medicine

## 2019-11-11 ENCOUNTER — Other Ambulatory Visit: Payer: Self-pay

## 2019-11-11 ENCOUNTER — Ambulatory Visit (INDEPENDENT_AMBULATORY_CARE_PROVIDER_SITE_OTHER): Payer: Medicare HMO

## 2019-11-11 DIAGNOSIS — R109 Unspecified abdominal pain: Secondary | ICD-10-CM

## 2019-11-11 DIAGNOSIS — R7302 Impaired glucose tolerance (oral): Secondary | ICD-10-CM

## 2019-11-11 DIAGNOSIS — I10 Essential (primary) hypertension: Secondary | ICD-10-CM | POA: Diagnosis not present

## 2019-11-11 LAB — COMPLETE METABOLIC PANEL WITH GFR
AG Ratio: 1.1 (calc) (ref 1.0–2.5)
ALT: 402 U/L — ABNORMAL HIGH (ref 9–46)
AST: 280 U/L — ABNORMAL HIGH (ref 10–35)
Albumin: 4.1 g/dL (ref 3.6–5.1)
Alkaline phosphatase (APISO): 340 U/L — ABNORMAL HIGH (ref 35–144)
BUN/Creatinine Ratio: 30 (calc) — ABNORMAL HIGH (ref 6–22)
BUN: 10 mg/dL (ref 7–25)
CO2: 25 mmol/L (ref 20–32)
Calcium: 9.3 mg/dL (ref 8.6–10.3)
Chloride: 99 mmol/L (ref 98–110)
Creat: 0.33 mg/dL — ABNORMAL LOW (ref 0.70–1.18)
GFR, Est African American: 146 mL/min/{1.73_m2} (ref >=60)
GFR, Est Non African American: 126 mL/min/{1.73_m2} (ref >=60)
Globulin: 3.6 g/dL (calc) (ref 1.9–3.7)
Glucose, Bld: 92 mg/dL (ref 65–99)
Potassium: 3.4 mmol/L — ABNORMAL LOW (ref 3.5–5.3)
Sodium: 137 mmol/L (ref 135–146)
Total Bilirubin: 3.1 mg/dL — ABNORMAL HIGH (ref 0.2–1.2)
Total Protein: 7.7 g/dL (ref 6.1–8.1)

## 2019-11-11 NOTE — Patient Instructions (Signed)
I am not certain of the source of your problem today  Please continue all other medications as before, and refills have been done if requested.  Please have the pharmacy call with any other refills you may need.  Please keep your appointments with your specialists as you may have planned  Please go to the XRAY Department in the first floor for the x-ray testing  Please go to the LAB at the blood drawing area for the tests to be done  You will be contacted by phone if any changes need to be made immediately.  Otherwise, you will receive a letter about your results with an explanation, but please check with MyChart first.  Please remember to sign up for MyChart if you have not done so, as this will be important to you in the future with finding out test results, communicating by private email, and scheduling acute appointments online when needed

## 2019-11-11 NOTE — Progress Notes (Signed)
Subjective:    Patient ID: Thomas Raymond, male    DOB: [birth year], 74 y.o.   MRN: [id number]  HPI Here with c/o 1 wk worsening left lateral abd pain, o/w difficult to characterize it seems, today actually feels better at 2/10 but was severe a few days ago, dull and sharp, nonpleuritic; and not assoc with n/v in recent days but did vomit x 1 a few days ago.  Denies worsening reflux, dysphagia, other bowel change or blood.  No fever and does not appear toxic.  Pt denies chest pain, increased sob or doe, wheezing, orthopnea, PND, increased LE swelling, palpitations, dizziness or syncope.  Pt denies new neurological symptoms such as new headache, or facial or extremity weakness or numbness   Pt denies polydipsia, polyuria Past Medical History:  Diagnosis Date  . ALLERGIC RHINITIS   . ALS   . HYPERLIPIDEMIA   . HYPERTENSION   . Impaired glucose tolerance 02/17/2011  . LOW BACK PAIN   . VERTIGO    Past Surgical History:  Procedure Laterality Date  . COMPRESSION HIP SCREW Left 02/07/2014   Procedure: IM Nail;  Surgeon: Harvie Junior, MD;  Location: [facility];  Service: Orthopedics;  Laterality: Left;  . IR GENERIC HISTORICAL  11/21/2015   IR RADIOLOGIST EVAL & MGMT 11/21/2015 Oley Balm, MD [facility] INTERV RAD  . Lipoma removal  2003  . Percutaneous gastrostomy tube repalcement     04/1999 and removed 07/1999  . TRACHEOSTOMY      reports that he has never smoked. He has never used smokeless tobacco. He reports that he does not drink alcohol and does not use drugs. family history includes Other in his father and mother; Other (age of onset: 2) in his daughter and son. No Known Allergies No current facility-administered medications on file prior to visit.   Current Outpatient Medications on File Prior to Visit  Medication Sig Dispense Refill  . acetaminophen (TYLENOL) 325 MG tablet Take 2 tablets (650 mg total) by mouth every 6 (six) hours as needed for mild pain (or temp > 100). (Patient not taking:  Reported on 11/12/2019)    . albuterol (PROVENTIL HFA;VENTOLIN HFA) 108 (90 Base) MCG/ACT inhaler Inhale 2 puffs into the lungs every 6 (six) hours as needed for wheezing or shortness of breath. (Patient not taking: Reported on 11/12/2019) 1 Inhaler 2  . amLODipine (NORVASC) 10 MG tablet Take 1 tablet (10 mg total) by mouth daily. 90 tablet 3  . cetirizine (ZYRTEC) 10 MG tablet TAKE 1 TABLET BY MOUTH EVERY DAY (Patient taking differently: Take 10 mg by mouth daily. ) 90 tablet 0  . meclizine (ANTIVERT) 12.5 MG tablet TAKE 1 TABLET BY MOUTH 3 TIMES A DAY AS NEEDED FOR DIZZINESS (Patient not taking: Reported on 11/12/2019) 60 tablet 2  . metoprolol succinate (TOPROL-XL) 50 MG 24 hr tablet Take 1 tablet (50 mg total) by mouth daily. 90 tablet 3  . omeprazole (PRILOSEC) 20 MG capsule Take 1 capsule (20 mg total) by mouth daily. 90 capsule 3  . rosuvastatin (CRESTOR) 20 MG tablet TAKE 1 TABLET BY MOUTH EVERY DAY (Patient taking differently: Take 20 mg by mouth daily. ) 90 tablet 3   Review of Systems All otherwise neg per pt     Objective:   Physical Exam BP (!) 120/60 (BP Location: Left Arm, Patient Position: Sitting, Cuff Size: Large)   Pulse 72   Temp 98.9 F (37.2 C) (Oral)   Ht 5\' 1"  (1.549 m)  SpO2 98%   BMI 28.53 kg/m  VS noted,  Constitutional: Pt appears in NAD HENT: Head: NCAT.  Right Ear: External ear normal.  Left Ear: External ear normal.  Eyes: . Pupils are equal, round, and reactive to light. Conjunctivae and EOM are normal Nose: without d/c or deformity Neck: Neck supple. Gross normal ROM Cardiovascular: Normal rate and regular rhythm.   Pulmonary/Chest: Effort normal and breath sounds without rales or wheezing.  Abd:  Soft, NT, ND, + BS, no organomegaly - benignNeurological: Pt is alert. At baseline orientation, motor grossly intact Skin: Skin is warm. No rashes, other new lesions, no LE edema Psychiatric: Pt behavior is normal without agitation  All otherwise neg per  pt    Assessment & Plan:

## 2019-11-12 ENCOUNTER — Emergency Department (HOSPITAL_COMMUNITY): Payer: Medicare HMO

## 2019-11-12 ENCOUNTER — Other Ambulatory Visit: Payer: Self-pay

## 2019-11-12 ENCOUNTER — Encounter (HOSPITAL_COMMUNITY): Payer: Self-pay | Admitting: Radiology

## 2019-11-12 ENCOUNTER — Inpatient Hospital Stay (HOSPITAL_COMMUNITY)
Admission: EM | Admit: 2019-11-12 | Discharge: 2019-11-16 | DRG: 418 | Disposition: A | Discharge status: Home / Self Care | Payer: Medicare HMO | Attending: Internal Medicine | Admitting: Internal Medicine

## 2019-11-12 ENCOUNTER — Encounter: Payer: Self-pay | Admitting: Internal Medicine

## 2019-11-12 DIAGNOSIS — G1221 Amyotrophic lateral sclerosis: Secondary | ICD-10-CM | POA: Diagnosis not present

## 2019-11-12 DIAGNOSIS — R0902 Hypoxemia: Secondary | ICD-10-CM | POA: Diagnosis not present

## 2019-11-12 DIAGNOSIS — R1314 Dysphagia, pharyngoesophageal phase: Secondary | ICD-10-CM | POA: Diagnosis not present

## 2019-11-12 DIAGNOSIS — E785 Hyperlipidemia, unspecified: Secondary | ICD-10-CM | POA: Diagnosis not present

## 2019-11-12 DIAGNOSIS — E876 Hypokalemia: Secondary | ICD-10-CM | POA: Diagnosis not present

## 2019-11-12 DIAGNOSIS — I10 Essential (primary) hypertension: Secondary | ICD-10-CM | POA: Diagnosis present

## 2019-11-12 DIAGNOSIS — R748 Abnormal levels of other serum enzymes: Secondary | ICD-10-CM | POA: Insufficient documentation

## 2019-11-12 DIAGNOSIS — R935 Abnormal findings on diagnostic imaging of other abdominal regions, including retroperitoneum: Secondary | ICD-10-CM | POA: Diagnosis not present

## 2019-11-12 DIAGNOSIS — K838 Other specified diseases of biliary tract: Secondary | ICD-10-CM | POA: Diagnosis not present

## 2019-11-12 DIAGNOSIS — K7689 Other specified diseases of liver: Secondary | ICD-10-CM | POA: Diagnosis not present

## 2019-11-12 DIAGNOSIS — K219 Gastro-esophageal reflux disease without esophagitis: Secondary | ICD-10-CM | POA: Diagnosis not present

## 2019-11-12 DIAGNOSIS — Z20822 Contact with and (suspected) exposure to covid-19: Secondary | ICD-10-CM | POA: Diagnosis present

## 2019-11-12 DIAGNOSIS — K8050 Calculus of bile duct without cholangitis or cholecystitis without obstruction: Secondary | ICD-10-CM | POA: Diagnosis not present

## 2019-11-12 DIAGNOSIS — Z993 Dependence on wheelchair: Secondary | ICD-10-CM | POA: Diagnosis not present

## 2019-11-12 DIAGNOSIS — R5381 Other malaise: Secondary | ICD-10-CM | POA: Diagnosis not present

## 2019-11-12 DIAGNOSIS — R7889 Finding of other specified substances, not normally found in blood: Secondary | ICD-10-CM | POA: Diagnosis not present

## 2019-11-12 DIAGNOSIS — K5732 Diverticulitis of large intestine without perforation or abscess without bleeding: Secondary | ICD-10-CM | POA: Diagnosis not present

## 2019-11-12 DIAGNOSIS — J9601 Acute respiratory failure with hypoxia: Secondary | ICD-10-CM | POA: Diagnosis not present

## 2019-11-12 DIAGNOSIS — R112 Nausea with vomiting, unspecified: Secondary | ICD-10-CM | POA: Diagnosis not present

## 2019-11-12 DIAGNOSIS — Z7401 Bed confinement status: Secondary | ICD-10-CM | POA: Diagnosis not present

## 2019-11-12 DIAGNOSIS — K8064 Calculus of gallbladder and bile duct with chronic cholecystitis without obstruction: Principal | ICD-10-CM | POA: Diagnosis present

## 2019-11-12 DIAGNOSIS — K8040 Calculus of bile duct with cholecystitis, unspecified, without obstruction: Secondary | ICD-10-CM | POA: Diagnosis not present

## 2019-11-12 DIAGNOSIS — K8010 Calculus of gallbladder with chronic cholecystitis without obstruction: Secondary | ICD-10-CM | POA: Diagnosis not present

## 2019-11-12 DIAGNOSIS — G629 Polyneuropathy, unspecified: Secondary | ICD-10-CM | POA: Diagnosis not present

## 2019-11-12 DIAGNOSIS — R52 Pain, unspecified: Secondary | ICD-10-CM | POA: Diagnosis not present

## 2019-11-12 DIAGNOSIS — K8020 Calculus of gallbladder without cholecystitis without obstruction: Secondary | ICD-10-CM | POA: Diagnosis not present

## 2019-11-12 DIAGNOSIS — R945 Abnormal results of liver function studies: Secondary | ICD-10-CM | POA: Diagnosis not present

## 2019-11-12 DIAGNOSIS — M2550 Pain in unspecified joint: Secondary | ICD-10-CM | POA: Diagnosis not present

## 2019-11-12 LAB — CBC WITH DIFFERENTIAL/PLATELET
Absolute Monocytes: 719 cells/uL (ref 200–950)
Basophils Absolute: 32 cells/uL (ref 0–200)
Basophils Relative: 0.4 %
Eosinophils Absolute: 24 cells/uL (ref 15–500)
Eosinophils Relative: 0.3 %
HCT: 45.1 % (ref 38.5–50.0)
Hemoglobin: 14.6 g/dL (ref 13.2–17.1)
Lymphs Abs: 932 cells/uL (ref 850–3900)
MCH: 29.1 pg (ref 27.0–33.0)
MCHC: 32.4 g/dL (ref 32.0–36.0)
MCV: 90 fL (ref 80.0–100.0)
MPV: 11.1 fL (ref 7.5–12.5)
Monocytes Relative: 9.1 %
Neutro Abs: 6194 cells/uL (ref 1500–7800)
Neutrophils Relative %: 78.4 %
Platelets: 233 10*3/uL (ref 140–400)
RBC: 5.01 10*6/uL (ref 4.20–5.80)
RDW: 12.8 % (ref 11.0–15.0)
Total Lymphocyte: 11.8 %
WBC: 7.9 10*3/uL (ref 3.8–10.8)

## 2019-11-12 LAB — URINALYSIS, ROUTINE W REFLEX MICROSCOPIC
Bacteria, UA: NONE SEEN
Bilirubin Urine: NEGATIVE
Glucose, UA: NEGATIVE
Glucose, UA: NEGATIVE mg/dL
Hyaline Cast: NONE SEEN /LPF
Ketones, ur: NEGATIVE
Ketones, ur: NEGATIVE mg/dL
Leukocytes,Ua: NEGATIVE
Leukocytes,Ua: NEGATIVE
Nitrite: NEGATIVE
Nitrite: NEGATIVE
Protein, ur: 30 mg/dL — AB
Specific Gravity, Urine: 1.011 (ref 1.001–1.03)
Specific Gravity, Urine: 1.004 — ABNORMAL LOW (ref 1.005–1.030)
Squamous Epithelial / HPF: NONE SEEN /HPF (ref <=5)
pH: 6.5 (ref 5.0–8.0)
pH: 6 (ref 5.0–8.0)

## 2019-11-12 LAB — URINE CULTURE

## 2019-11-12 LAB — COMPREHENSIVE METABOLIC PANEL
ALT: 277 U/L — ABNORMAL HIGH (ref 0–44)
AST: 159 U/L — ABNORMAL HIGH (ref 15–41)
Albumin: 3.2 g/dL — ABNORMAL LOW (ref 3.5–5.0)
Alkaline Phosphatase: 256 U/L — ABNORMAL HIGH (ref 38–126)
Anion gap: 9 (ref 5–15)
BUN: 9 mg/dL (ref 8–23)
CO2: 28 mmol/L (ref 22–32)
Calcium: 9.1 mg/dL (ref 8.9–10.3)
Chloride: 101 mmol/L (ref 98–111)
Creatinine, Ser: 0.45 mg/dL — ABNORMAL LOW (ref 0.61–1.24)
GFR calc Af Amer: >60 mL/min (ref >60)
GFR calc non Af Amer: >60 mL/min (ref >60)
Glucose, Bld: 138 mg/dL — ABNORMAL HIGH (ref 70–99)
Potassium: 3.1 mmol/L — ABNORMAL LOW (ref 3.5–5.1)
Sodium: 138 mmol/L (ref 135–145)
Total Bilirubin: 1.7 mg/dL — ABNORMAL HIGH (ref 0.3–1.2)
Total Protein: 7.7 g/dL (ref 6.5–8.1)

## 2019-11-12 LAB — CBC
HCT: 44.4 % (ref 39.0–52.0)
Hemoglobin: 13.9 g/dL (ref 13.0–17.0)
MCH: 29.1 pg (ref 26.0–34.0)
MCHC: 31.3 g/dL (ref 30.0–36.0)
MCV: 92.9 fL (ref 80.0–100.0)
Platelets: 226 10*3/uL (ref 150–400)
RBC: 4.78 MIL/uL (ref 4.22–5.81)
RDW: 13.5 % (ref 11.5–15.5)
WBC: 4.7 10*3/uL (ref 4.0–10.5)
nRBC: 0 % (ref 0.0–0.2)

## 2019-11-12 LAB — SARS CORONAVIRUS 2 BY RT PCR (HOSPITAL ORDER, PERFORMED IN ~~LOC~~ HOSPITAL LAB): SARS Coronavirus 2: NEGATIVE

## 2019-11-12 LAB — LIPASE, BLOOD: Lipase: 29 U/L (ref 11–51)

## 2019-11-12 LAB — LIPASE: Lipase: 23 U/L (ref 7–60)

## 2019-11-12 MED ORDER — SODIUM CHLORIDE 0.9 % IV BOLUS
500.0000 mL | Freq: Once | INTRAVENOUS | Status: AC
  Administered 2019-11-12: 500 mL via INTRAVENOUS

## 2019-11-12 MED ORDER — DEXTROSE-NACL 5-0.9 % IV SOLN: INTRAVENOUS | Status: AC

## 2019-11-12 MED ORDER — ONDANSETRON HCL 4 MG/2ML IJ SOLN: 4.0000 mg | Freq: Four times a day (QID) | INTRAMUSCULAR | Status: DC | PRN

## 2019-11-12 MED ORDER — SODIUM CHLORIDE 0.9% FLUSH
3.0000 mL | Freq: Once | INTRAVENOUS | Status: AC
  Administered 2019-11-13: 3 mL via INTRAVENOUS

## 2019-11-12 MED ORDER — ONDANSETRON HCL 4 MG PO TABS: 4.0000 mg | ORAL_TABLET | Freq: Four times a day (QID) | ORAL | Status: DC | PRN

## 2019-11-12 MED ORDER — METOPROLOL TARTRATE 5 MG/5ML IV SOLN
2.5000 mg | Freq: Four times a day (QID) | INTRAVENOUS | Status: DC
  Administered 2019-11-13: 2.5 mg via INTRAVENOUS
  Filled 2019-11-12: qty 5

## 2019-11-12 MED ORDER — HYDRALAZINE HCL 20 MG/ML IJ SOLN: 10.0000 mg | INTRAMUSCULAR | Status: DC | PRN

## 2019-11-12 MED ORDER — IOHEXOL 300 MG/ML  SOLN
100.0000 mL | Freq: Once | INTRAMUSCULAR | Status: AC | PRN
  Administered 2019-11-12: 100 mL via INTRAVENOUS

## 2019-11-12 NOTE — ED Provider Notes (Addendum)
[facility] Provider Note   CSN: [id number] Arrival date & time: 11/12/19  [id number]     History Chief Complaint  Patient presents with  . Abnormal Lab    Thomas Raymond is a 74 y.o. male.  74 y.o male wiyhsa PMH of ALS, HTN presents to the ED with a chief complaint of abnormal labs. Patient was evaluated by PCP yesterday after reporting some abdominal pain then.  Patient reports he has had pain for the past 2 days, sinuses left-sided and intermittent.  He had an x-ray done of the chest and belly yesterday, was told to referred to the ED.  He does endorse some chills at night, but has not measure his temperature. He currently is wheelchair bound due to ALS but also reports some decrease in his daily activity.  According to patient, he was placed on a medication unknown what this medication was but has been taking "a pill ", for the past couple of days.  Does have a prior surgical history of a G-tube placement.  Does report taking Tylenol but this only occurs once a month.  No alcohol abuse, no herbal supplements use, no changes in his bowel movements.  No blood in his stool.   The history is provided by the patient. A language interpreter was used.  Abnormal Lab      Past Medical History:  Diagnosis Date  . ALLERGIC RHINITIS   . ALS   . HYPERLIPIDEMIA   . HYPERTENSION   . Impaired glucose tolerance 02/17/2011  . LOW BACK PAIN   . VERTIGO     Patient Active Problem List   Diagnosis Date Noted  . Abdominal pain, left lateral 11/11/2019  . Conjunctivitis 11/13/2017  . Acute sinus infection 05/18/2017  . Acute upper respiratory infection 09/19/2016  . Acute hypoxemic respiratory failure (HCC)   . Atelectasis of both lungs   . Acute emphysematous cholecystitis   . Hypokalemia   . Hypomagnesemia   . Dysphagia, pharyngoesophageal phase 02/07/2014  . 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  . Colon cancer screening 01/18/2014  . Need for  prophylactic vaccination and inoculation against influenza 02/07/2012  . Need for prophylactic vaccination against Streptococcus pneumoniae (pneumococcus) 02/07/2012  . Respiratory tract infection 04/24/2011  . 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  . Impaired glucose tolerance 02/17/2011  . Preventative health care 02/17/2011  . BPPV (benign paroxysmal positional vertigo) 11/05/2010  . VERTIGO 03/05/2010  . ALS 01/26/2009  . FATIGUE 03/30/2008  . ALLERGIC CONJUNCTIVITIS 12/29/2007  . THRUSH 09/29/2007  . Hyperlipidemia 03/30/2007  . METHICILLIN RESISTANT STAPHYLOCOCCUS AUREUS INFECTION 11/22/2006  . DISORDER, MYONEURAL NEC 11/22/2006  . Essential hypertension 11/22/2006  . ALLERGIC RHINITIS 11/22/2006  . LOW BACK PAIN 11/22/2006  . Other dysphagia 11/22/2006    Past Surgical History:  Procedure Laterality Date  . COMPRESSION HIP SCREW Left 02/07/2014   Procedure: IM Nail;  Surgeon: Harvie JuniorJohn L Graves, MD;  Location: [facility];  Service: Orthopedics;  Laterality: Left;  . IR GENERIC HISTORICAL  11/21/2015   IR RADIOLOGIST EVAL & MGMT 11/21/2015 Oley Balmaniel Hassell, MD [facility] INTERV RAD  . Lipoma removal  2003  . Percutaneous gastrostomy tube repalcement     04/1999 and removed 07/1999  . TRACHEOSTOMY         Family History  Problem Relation Age of Onset  . Other Mother        Deceased  . Other Father        Deceased  . Other Son 2  x 2 starvation/illness from [place]  . Other Daughter 2       x 2 starvation/illness from [place]    Social History   Tobacco Use  . Smoking status: Never Smoker  . Smokeless tobacco: Never Used  Substance Use Topics  . Alcohol use: No  . Drug use: No    Home Medications Prior to Admission medications   Medication Sig Start Date End Date Taking? Authorizing Provider  acetaminophen (TYLENOL) 325 MG tablet Take 2 tablets (650 mg total) by mouth every 6 (six) hours as needed for mild pain (or temp > 100). 07/21/15   Riebock, Anette Riedel, NP    albuterol (PROVENTIL HFA;VENTOLIN HFA) 108 (90 Base) MCG/ACT inhaler Inhale 2 puffs into the lungs every 6 (six) hours as needed for wheezing or shortness of breath. 10/21/15   Dhungel, Nishant, MD  amLODipine (NORVASC) 10 MG tablet Take 1 tablet (10 mg total) by mouth daily. 09/09/19   Corwin Levins, MD  cetirizine (ZYRTEC) 10 MG tablet TAKE 1 TABLET BY MOUTH EVERY DAY 10/30/19   Corwin Levins, MD  meclizine (ANTIVERT) 12.5 MG tablet TAKE 1 TABLET BY MOUTH 3 TIMES A DAY AS NEEDED FOR DIZZINESS Patient taking differently: Take 12.5 mg by mouth 3 (three) times daily as needed.  07/27/19   Corwin Levins, MD  metoprolol succinate (TOPROL-XL) 50 MG 24 hr tablet Take 1 tablet (50 mg total) by mouth daily. 07/01/19   Corwin Levins, MD  omeprazole (PRILOSEC) 20 MG capsule Take 1 capsule (20 mg total) by mouth daily. 10/18/19   Corwin Levins, MD  rosuvastatin (CRESTOR) 20 MG tablet TAKE 1 TABLET BY MOUTH EVERY DAY 10/18/19   Corwin Levins, MD    Allergies    Patient has no known allergies.  Review of Systems   Review of Systems  Constitutional: Positive for chills. Negative for fever.  HENT: Negative for sore throat.   Respiratory: Negative for shortness of breath.   Cardiovascular: Negative for chest pain.  Gastrointestinal: Positive for abdominal pain. Negative for blood in stool, diarrhea, nausea and vomiting.  Genitourinary: Negative for flank pain.  Musculoskeletal: Negative for back pain.  Neurological: Negative for light-headedness and headaches.  All other systems reviewed and are negative.   Physical Exam Updated Vital Signs BP (!) 169/71   Pulse 79   Temp 97.9 F (36.6 C) (Oral)   Resp (!) 24   SpO2 99%   Physical Exam Vitals and nursing note reviewed.  HENT:     Head: Normocephalic and atraumatic.     Mouth/Throat:     Mouth: Mucous membranes are dry.  Eyes:     Pupils: Pupils are equal, round, and reactive to light.  Cardiovascular:     Rate and Rhythm: Normal rate.   Pulmonary:     Effort: Pulmonary effort is normal.     Breath sounds: No wheezing or rales.  Abdominal:     General: Abdomen is flat.     Palpations: Abdomen is soft.     Tenderness: There is abdominal tenderness. There is no right CVA tenderness or left CVA tenderness.     Hernia: No hernia is present.  Musculoskeletal:     Cervical back: Normal range of motion and neck supple.  Skin:    General: Skin is warm and dry.  Neurological:     Mental Status: He is alert and oriented to person, place, and time.     ED Results / Procedures /  Treatments   Labs (all labs ordered are listed, but only abnormal results are displayed) Labs Reviewed  COMPREHENSIVE METABOLIC PANEL - Abnormal; Notable for the following components:      Result Value   Potassium 3.1 (*)    Glucose, Bld 138 (*)    Creatinine, Ser 0.45 (*)    Albumin 3.2 (*)    AST 159 (*)    ALT 277 (*)    Alkaline Phosphatase 256 (*)    Total Bilirubin 1.7 (*)    All other components within normal limits  URINALYSIS, ROUTINE W REFLEX MICROSCOPIC - Abnormal; Notable for the following components:   Specific Gravity, Urine 1.004 (*)    Hgb urine dipstick MODERATE (*)    Protein, ur 30 (*)    All other components within normal limits  LIPASE, BLOOD  CBC    EKG None  Radiology CT ABDOMEN PELVIS W CONTRAST  Result Date: 11/12/2019 CLINICAL DATA:  Diverticulitis. Elevated liver enzymes with left lower quadrant pain. EXAM: CT ABDOMEN AND PELVIS WITH CONTRAST TECHNIQUE: Multidetector CT imaging of the abdomen and pelvis was performed using the standard protocol following bolus administration of intravenous contrast. CONTRAST:  OMNIPAQUE IOHEXOL 300 MG/ML  SOLN COMPARISON:  Ultrasound from the same day. CT dated October 16, 2015. CT dated November 21, 2015. FINDINGS: Lower chest: The lung bases are clear. The heart is enlarged. Hepatobiliary: The liver is normal. Cholelithiasis without acute inflammation.There is intrahepatic and  extrahepatic biliary ductal dilatation. The common bile duct measures up to approximately 0.9 cm. Multiple filling defects are noted within the distal common bile duct (coronal series 6, image 57). Pancreas: Normal contours without ductal dilatation. No peripancreatic fluid collection. Spleen: Unremarkable. Adrenals/Urinary Tract: --Adrenal glands: Unremarkable. --Right kidney/ureter: No hydronephrosis or radiopaque kidney stones. --Left kidney/ureter: No hydronephrosis or radiopaque kidney stones. --Urinary bladder: Unremarkable. Stomach/Bowel: --Stomach/Duodenum: No hiatal hernia or other gastric abnormality. Normal duodenal course and caliber. --Small bowel: Unremarkable. --Colon: Unremarkable. --Appendix: Normal. Vascular/Lymphatic: Atherosclerotic calcification is present within the non-aneurysmal abdominal aorta, without hemodynamically significant stenosis. --No retroperitoneal lymphadenopathy. --No mesenteric lymphadenopathy. --No pelvic or inguinal lymphadenopathy. Reproductive: Unremarkable Other: No ascites or free air. The abdominal wall is normal. Musculoskeletal. The patient is status post prior left-sided intramedullary nail placement through the left femur. IMPRESSION: 1. Findings are consistent with choledocholithiasis with intrahepatic and extrahepatic biliary ductal dilatation. 2. Cholelithiasis without acute inflammation. 3. Cardiomegaly. Aortic Atherosclerosis (ICD10-I70.0). Electronically Signed   By: Katherine Mantle M.D.   On: 11/12/2019 19:37   DG Abd Acute W/Chest  Result Date: 11/12/2019 CLINICAL DATA:  Abdominal pain. EXAM: DG ABDOMEN ACUTE W/ 1V CHEST COMPARISON:  11/21/2015 FINDINGS: There is elevation of the right hemidiaphragm. The heart size is normal. Aortic calcifications are noted. There is no significant pleural effusion or focal infiltrate. The bowel gas pattern is nonobstructive. There are no radiopaque kidney stones. IMPRESSION: Negative abdominal radiographs.  No acute  cardiopulmonary disease. Electronically Signed   By: Katherine Mantle M.D.   On: 11/12/2019 19:38   US Abdomen Limited RUQ  Result Date: 11/12/2019 CLINICAL DATA:  Elevated LFTs, recurrent acalculous cholecystitis. EXAM: ULTRASOUND ABDOMEN LIMITED RIGHT UPPER QUADRANT COMPARISON:  Ultrasound 10/17/2015, CT 12/11/2015 the yeah sounds go thanks neck as the FINDINGS: Gallbladder: Gallbladder is poorly visualized largely contracted around echogenic stones and biliary sludge, largest calculus measuring up to 3 mm in maximal dimension with posterior shadowing. Wall is borderline thickened at 4 mm though possibility of to the underdistention. Sonographic Murphy sign reportedly negative  by sonographer. Common bile duct: Diameter: 3 mm, nondilated. Liver: Mildly nodular liver surface contour. Heterogeneous echotexture with mildly increased echogenicity and diminished through transmission. Portal vein is patent on color Doppler imaging with normal direction of blood flow towards the liver. Other: Multiple anechoic simple appearing right renal cysts noted incidentally, largest measuring up to 1.5 cm in size. IMPRESSION: Mildly nodular liver surface contour with heterogeneous echotexture and mildly increased echogenicity. Features are suggestive of cirrhosis/intrinsic liver disease with possible fatty infiltration. Gallbladder appears contracted around a combination of echogenic stones and biliary sludge. Wall thickening possibly related to underdistention/contraction. The absence of a sonographic Eulah Pont sign is reassuring however if there is clinical concern for acute cholecystitis, consider HIDA for further evaluation. Electronically Signed   By: Kreg Shropshire M.D.   On: 11/12/2019 17:45    Procedures Procedures (including critical care time)  Medications Ordered in ED Medications  sodium chloride flush (NS) 0.9 % injection 3 mL (3 mLs Intravenous Not Given 11/12/19 1721)  sodium chloride 0.9 % bolus 500 mL (0  mLs Intravenous Stopped 11/12/19 1945)  iohexol (OMNIPAQUE) 300 MG/ML solution 100 mL (100 mLs Intravenous Contrast Given 11/12/19 1905)    ED Course  I have reviewed the triage vital signs and the nursing notes.  Pertinent labs & imaging results that were available during my care of the patient were reviewed by me and considered in my medical decision making (see chart for details).  Clinical Course as of Nov 11 2009  Fri Nov 12, 2019  1727 ALT(!): 277 [JS]  1727 AST(!): 159 [JS]  1727 Potassium(!): 3.1 [JS]  1727 Hgb urine dipstickMarland Kitchen): MODERATE [JS]    Clinical Course User Index [JS] Claude Manges, PA-C   MDM Rules/Calculators/A&P    Patient with a PMH of ALS, HTN presents to the ED with a chief complaint of left side abdominal pain.  Patient was evaluated by PCP yesterday, had abnormal LFTs therefore sent to the ED for further evaluation.  According to patient's chart which have extensively reviewed, I do not have enough information from the note which states on reasoning for patient's arrival.  An interpreter was used, history was obtained by me with Dr. Fredderick Phenix at the bedside.  Patient has had some left sided abdominal pain for the past week.  He also endorses some chills and night, was giving a "pill ", by PCP which did not improve his symptoms.  Evaluation patient is chronically ill-appearing, vitals are within normal limits, he is afebrile, abdomen is soft, mildly tender to palpation with a prior surgical scar from a PEG tube placement.  He does not appear jaundice, no scleral icterus on my exam either.  All sounds are present, he has had regular bowel movements the last couple of days without any blood in his stool.  CBC is unremarkable.  CMP remarkable for some mild hypokalemia, LFTs are AST 159, ALT 277, improved since yesterday's visit, denies any alcohol use, does take Tylenol once a month, no herbal supplements use.  5:01 PM Called place to Dr. Oliver Barre after hours on call  physician.  5:20 PM Spoke to on call, Dr. Salena Saner, unfortunately he is unaware of patient, does not have any additional information on patient's condition.  We were both unable to pull up a finalized note from Dr. Oliver Barre.  US showed: Mildly nodular liver surface contour with heterogeneous echotexture  and mildly increased echogenicity. Features are suggestive of  cirrhosis/intrinsic liver disease with possible fatty infiltration.  Gallbladder appears contracted around a combination of echogenic  stones and biliary sludge. Wall thickening possibly related to  underdistention/contraction. The absence of a sonographic Eulah Pont  sign is reassuring however if there is clinical concern for acute  cholecystitis, consider HIDA for further evaluation.     6:52 PM patient's daughter called, updated on status.  She was also asked to interpret for father and will be going for CT abdomen. CT Abdomen and pelvis showed:  1. Findings are consistent with choledocholithiasis with  intrahepatic and extrahepatic biliary ductal dilatation.  2. Cholelithiasis without acute inflammation.  3. Cardiomegaly.   7:43 PM Called placed for hospitalist admission, he will likely need ERCP, GI consultation is pending admission.   8:07 PM Spoke to Dr. Toniann Fail who will admit patient for further management. Patient stable for admission.Daughter Sophat updated on plan.  Portions of this note were generated with Scientist, clinical (histocompatibility and immunogenetics). Dictation errors may occur despite best attempts at proofreading.   Final Clinical Impression(s) / ED Diagnoses Final diagnoses:  Elevated liver enzymes  Choledocholithiasis    Rx / DC Orders ED Discharge Orders    None       Claude Manges, PA-C 11/12/19 2011    Terese Heier, PA-C 11/12/19 2014    Rolan Bucco, MD 11/12/19 2016

## 2019-11-12 NOTE — ED Triage Notes (Signed)
Pt bib ems from home with reports of elevated liver enzymes. Yesterday pt did have LLQ pain with N/V and chills. Went to PCP and had blood work done. was sent here for possible CT scan due to elevated labs. Pt with no complaints today. Hx ALS. 142/62 HR 64 98% RA 97.55F 18 RR

## 2019-11-12 NOTE — H&P (Addendum)
History and Physical    De Graff Maland BPZ:[id number] DOB: January 07, 1946 DOA: 11/12/2019  PCP: Corwin Levins, MD  Patient coming from: Home.  Chief Complaint: Abnormal labs.  HPI: Thomas Raymond is a 74 y.o. male with history of ALS wheelchair-bound with weakness of both lower extremities presents to the ER after patient was found to have abnormal labs which were done as a routine work-up yesterday by patient's primary care physician.  Patient denies any abdominal pain nausea vomiting or diarrhea.  Denies any fever chills chest pain or shortness of breath.  ED Course: The ER patient abdomen appears benign.  Labs are remarkable for alkaline phosphatase of 236 AST 159 ALT 277 total bilirubin 1.7.  Covid test was negative.  Sonogram of the abdomen with showing features concerning for cirrhosis.  CT abdomen pelvis shows features concerning for choledocholithiasis with intrahepatic and extrahepatic biliary ductal dilatation.  Patient admitted for further management of choledocholithiasis.   Review of Systems: As per HPI, rest all negative.   Past Medical History:  Diagnosis Date  . ALLERGIC RHINITIS   . ALS   . HYPERLIPIDEMIA   . HYPERTENSION   . Impaired glucose tolerance 02/17/2011  . LOW BACK PAIN   . VERTIGO     Past Surgical History:  Procedure Laterality Date  . COMPRESSION HIP SCREW Left 02/07/2014   Procedure: IM Nail;  Surgeon: Harvie Junior, MD;  Location: [facility];  Service: Orthopedics;  Laterality: Left;  . IR GENERIC HISTORICAL  11/21/2015   IR RADIOLOGIST EVAL & MGMT 11/21/2015 Oley Balm, MD [facility] INTERV RAD  . Lipoma removal  2003  . Percutaneous gastrostomy tube repalcement     04/1999 and removed 07/1999  . TRACHEOSTOMY       reports that he has never smoked. He has never used smokeless tobacco. He reports that he does not drink alcohol and does not use drugs.  No Known Allergies  Family History  Problem Relation Age of Onset  . Other Mother        Deceased  . Other  Father        Deceased  . Other Son 2       x 2 starvation/illness from [place]  . Other Daughter 2       x 2 starvation/illness from [place]    Prior to Admission medications   Medication Sig Start Date End Date Taking? Authorizing Provider  amLODipine (NORVASC) 10 MG tablet Take 1 tablet (10 mg total) by mouth daily. 09/09/19  Yes Corwin Levins, MD  cetirizine (ZYRTEC) 10 MG tablet TAKE 1 TABLET BY MOUTH EVERY DAY Patient taking differently: Take 10 mg by mouth daily.  10/30/19  Yes Corwin Levins, MD  metoprolol succinate (TOPROL-XL) 50 MG 24 hr tablet Take 1 tablet (50 mg total) by mouth daily. 07/01/19  Yes Corwin Levins, MD  omeprazole (PRILOSEC) 20 MG capsule Take 1 capsule (20 mg total) by mouth daily. 10/18/19  Yes Corwin Levins, MD  rosuvastatin (CRESTOR) 20 MG tablet TAKE 1 TABLET BY MOUTH EVERY DAY Patient taking differently: Take 20 mg by mouth daily.  10/18/19  Yes Corwin Levins, MD  acetaminophen (TYLENOL) 325 MG tablet Take 2 tablets (650 mg total) by mouth every 6 (six) hours as needed for mild pain (or temp > 100). Patient not taking: Reported on 11/12/2019 07/21/15   Riebock, Anette Riedel, NP  albuterol (PROVENTIL HFA;VENTOLIN HFA) 108 (90 Base) MCG/ACT inhaler Inhale 2 puffs into the lungs  every 6 (six) hours as needed for wheezing or shortness of breath. Patient not taking: Reported on 11/12/2019 10/21/15   Dhungel, Theda Belfast, MD  meclizine (ANTIVERT) 12.5 MG tablet TAKE 1 TABLET BY MOUTH 3 TIMES A DAY AS NEEDED FOR DIZZINESS Patient not taking: Reported on 11/12/2019 07/27/19   Corwin Levins, MD    Physical Exam: Constitutional: Moderately built and nourished. Vitals:   11/12/19 1830 11/12/19 1945 11/12/19 2015 11/12/19 2315  BP: (!) 160/93 (!) 169/71 (!) 163/79 (!) 177/65  Pulse: 65 79 72 60  Resp:  (!) 24 18 14   Temp:      TempSrc:      SpO2: 99% 99% 98% 97%   Eyes: Anicteric no pallor. ENMT: No discharge from the ears eyes nose or mouth. Neck: No mass felt.  No neck  rigidity. Respiratory: No rhonchi or crepitations. Cardiovascular: S1-S2 heard. Abdomen: Soft nontender bowel sounds present. Musculoskeletal: No edema. Skin: No rash. Neurologic: Alert awake oriented to time place and person.  Moves all extremities. Psychiatric: Appears normal per normal affect.   Labs on Admission: I have personally reviewed following labs and imaging studies  CBC: Recent Labs  Lab 11/11/19 1358 11/12/19 0944  WBC 7.9 4.7  NEUTROABS 6,194  --   HGB 14.6 13.9  HCT 45.1 44.4  MCV 90.0 92.9  PLT 233 226   Basic Metabolic Panel: Recent Labs  Lab 11/11/19 1357 11/12/19 0944  NA 137 138  K 3.4* 3.1*  CL 99 101  CO2 25 28  GLUCOSE 92 138*  BUN 10 9  CREATININE 0.33* 0.45*  CALCIUM 9.3 9.1   GFR: CrCl cannot be calculated (Unknown ideal weight.). Liver Function Tests: Recent Labs  Lab 11/11/19 1357 11/12/19 0944  AST 280* 159*  ALT 402* 277*  ALKPHOS  --  256*  BILITOT 3.1* 1.7*  PROT 7.7 7.7  ALBUMIN  --  3.2*   Recent Labs  Lab 11/11/19 1358 11/12/19 0944  LIPASE 23 29   No results for input(s): AMMONIA in the last 168 hours. Coagulation Profile: No results for input(s): INR, PROTIME in the last 168 hours. Cardiac Enzymes: No results for input(s): CKTOTAL, CKMB, CKMBINDEX, TROPONINI in the last 168 hours. BNP (last 3 results) No results for input(s): PROBNP in the last 8760 hours. HbA1C: No results for input(s): HGBA1C in the last 72 hours. CBG: No results for input(s): GLUCAP in the last 168 hours. Lipid Profile: No results for input(s): CHOL, HDL, LDLCALC, TRIG, CHOLHDL, LDLDIRECT in the last 72 hours. Thyroid Function Tests: No results for input(s): TSH, T4TOTAL, FREET4, T3FREE, THYROIDAB in the last 72 hours. Anemia Panel: No results for input(s): VITAMINB12, FOLATE, FERRITIN, TIBC, IRON, RETICCTPCT in the last 72 hours. Urine analysis:    Component Value Date/Time   COLORURINE YELLOW 11/12/2019 0932   APPEARANCEUR CLEAR  11/12/2019 0932   LABSPEC 1.004 (L) 11/12/2019 0932   PHURINE 6.0 11/12/2019 0932   GLUCOSEU NEGATIVE 11/12/2019 0932   GLUCOSEU NEGATIVE 08/27/2019 1408   HGBUR MODERATE (A) 11/12/2019 0932   BILIRUBINUR NEGATIVE 11/12/2019 0932   KETONESUR NEGATIVE 11/12/2019 0932   PROTEINUR 30 (A) 11/12/2019 0932   UROBILINOGEN 0.2 08/27/2019 1408   NITRITE NEGATIVE 11/12/2019 0932   LEUKOCYTESUR NEGATIVE 11/12/2019 0932   Sepsis Labs: @LABRCNTIP (procalcitonin:4,lacticidven:4) ) Recent Results (from the past 240 hour(s))  Urine Culture     Status: None   Collection Time: 11/11/19  1:58 PM   Specimen: Blood  Result Value Ref Range Status   Source:  NOT GIVEN  Final   Status: FINAL  Final   Isolate 1:   Final    Growth of mixed flora was isolated, suggesting probable contamination. No further testing will be performed. If clinically indicated, recollection using a method to minimize contamination, with prompt transfer to Urine Culture Transport Tube, is  recommended.   SARS Coronavirus 2 by RT PCR (hospital order, performed in [facility] hospital lab) Nasopharyngeal Nasopharyngeal Swab     Status: None   Collection Time: 11/12/19  9:20 PM   Specimen: Nasopharyngeal Swab  Result Value Ref Range Status   SARS Coronavirus 2 NEGATIVE NEGATIVE Final    Comment: (NOTE) SARS-CoV-2 target nucleic acids are NOT DETECTED.  The SARS-CoV-2 RNA is generally detectable in upper and lower respiratory specimens during the acute phase of infection. The lowest concentration of SARS-CoV-2 viral copies this assay can detect is 250 copies / mL. A negative result does not preclude SARS-CoV-2 infection and should not be used as the sole basis for treatment or other patient management decisions.  A negative result may occur with improper specimen collection / handling, submission of specimen other than nasopharyngeal swab, presence of viral mutation(s) within the areas targeted by this assay, and inadequate  number of viral copies (<250 copies / mL). A negative result must be combined with clinical observations, patient history, and epidemiological information.  Fact Sheet for Patients:   BoilerBrush.com.cy  Fact Sheet for Healthcare Providers: https://pope.com/  This test is not yet approved or  cleared by the [place] FDA and has been authorized for detection and/or diagnosis of SARS-CoV-2 by FDA under an Emergency Use Authorization (EUA).  This EUA will remain in effect (meaning this test can be used) for the duration of the COVID-19 declaration under Section 564(b)(1) of the Act, 21 U.S.C. section 360bbb-3(b)(1), unless the authorization is terminated or revoked sooner.  Performed at [facility], [address] [place], [place] [address]      Radiological Exams on Admission: CT ABDOMEN PELVIS W CONTRAST  Result Date: 11/12/2019 CLINICAL DATA:  Diverticulitis. Elevated liver enzymes with left lower quadrant pain. EXAM: CT ABDOMEN AND PELVIS WITH CONTRAST TECHNIQUE: Multidetector CT imaging of the abdomen and pelvis was performed using the standard protocol following bolus administration of intravenous contrast. CONTRAST:  OMNIPAQUE IOHEXOL 300 MG/ML  SOLN COMPARISON:  Ultrasound from the same day. CT dated October 16, 2015. CT dated November 21, 2015. FINDINGS: Lower chest: The lung bases are clear. The heart is enlarged. Hepatobiliary: The liver is normal. Cholelithiasis without acute inflammation.There is intrahepatic and extrahepatic biliary ductal dilatation. The common bile duct measures up to approximately 0.9 cm. Multiple filling defects are noted within the distal common bile duct (coronal series 6, image 57). Pancreas: Normal contours without ductal dilatation. No peripancreatic fluid collection. Spleen: Unremarkable. Adrenals/Urinary Tract: --Adrenal glands: Unremarkable. --Right kidney/ureter: No hydronephrosis or  radiopaque kidney stones. --Left kidney/ureter: No hydronephrosis or radiopaque kidney stones. --Urinary bladder: Unremarkable. Stomach/Bowel: --Stomach/Duodenum: No hiatal hernia or other gastric abnormality. Normal duodenal course and caliber. --Small bowel: Unremarkable. --Colon: Unremarkable. --Appendix: Normal. Vascular/Lymphatic: Atherosclerotic calcification is present within the non-aneurysmal abdominal aorta, without hemodynamically significant stenosis. --No retroperitoneal lymphadenopathy. --No mesenteric lymphadenopathy. --No pelvic or inguinal lymphadenopathy. Reproductive: Unremarkable Other: No ascites or free air. The abdominal wall is normal. Musculoskeletal. The patient is status post prior left-sided intramedullary nail placement through the left femur. IMPRESSION: 1. Findings are consistent with choledocholithiasis with intrahepatic and extrahepatic biliary ductal dilatation. 2. Cholelithiasis without  acute inflammation. 3. Cardiomegaly. Aortic Atherosclerosis (ICD10-I70.0). Electronically Signed   By: Katherine Mantlehristopher  Green M.D.   On: 11/12/2019 19:37   DG Abd Acute W/Chest  Result Date: 11/12/2019 CLINICAL DATA:  Abdominal pain. EXAM: DG ABDOMEN ACUTE W/ 1V CHEST COMPARISON:  11/21/2015 FINDINGS: There is elevation of the right hemidiaphragm. The heart size is normal. Aortic calcifications are noted. There is no significant pleural effusion or focal infiltrate. The bowel gas pattern is nonobstructive. There are no radiopaque kidney stones. IMPRESSION: Negative abdominal radiographs.  No acute cardiopulmonary disease. Electronically Signed   By: Katherine Mantlehristopher  Green M.D.   On: 11/12/2019 19:38   US Abdomen Limited RUQ  Result Date: 11/12/2019 CLINICAL DATA:  Elevated LFTs, recurrent acalculous cholecystitis. EXAM: ULTRASOUND ABDOMEN LIMITED RIGHT UPPER QUADRANT COMPARISON:  Ultrasound 10/17/2015, CT 12/11/2015 the yeah sounds go thanks neck as the FINDINGS: Gallbladder: Gallbladder is poorly  visualized largely contracted around echogenic stones and biliary sludge, largest calculus measuring up to 3 mm in maximal dimension with posterior shadowing. Wall is borderline thickened at 4 mm though possibility of to the underdistention. Sonographic Murphy sign reportedly negative by sonographer. Common bile duct: Diameter: 3 mm, nondilated. Liver: Mildly nodular liver surface contour. Heterogeneous echotexture with mildly increased echogenicity and diminished through transmission. Portal vein is patent on color Doppler imaging with normal direction of blood flow towards the liver. Other: Multiple anechoic simple appearing right renal cysts noted incidentally, largest measuring up to 1.5 cm in size. IMPRESSION: Mildly nodular liver surface contour with heterogeneous echotexture and mildly increased echogenicity. Features are suggestive of cirrhosis/intrinsic liver disease with possible fatty infiltration. Gallbladder appears contracted around a combination of echogenic stones and biliary sludge. Wall thickening possibly related to underdistention/contraction. The absence of a sonographic Eulah PontMurphy sign is reassuring however if there is clinical concern for acute cholecystitis, consider HIDA for further evaluation. Electronically Signed   By: Kreg ShropshirePrice  DeHay M.D.   On: 11/12/2019 17:45     Assessment/Plan Principal Problem:   Choledocholithiasis Active Problems:   ALS   Essential hypertension    1. Choledocholithiasis -we will consult GI will send a secure chart message for on-call gastroenterologist.  Keep patient n.p.o. follow LFTs presently afebrile no signs of any infection.  May need ERCP. 2. Hypertension we will keep patient on as needed IV hydralazine and scheduled dose of IV metoprolol. 3. History of ALS presently wheelchair-bound.  Check an NIF and vital capacity every 12 hourly.  Since patient has choledocholithiasis will need close monitoring for any further worsening and may need procedure  and will need more than 2 midnight stay in inpatient status.   DVT prophylaxis: SCDs for now avoiding anticoagulation since patient may need procedure. Code Status: Full code. Family Communication: Discussed with patient. Disposition Plan: Home. Consults called: Secure chat message for her gastroenterologist. Admission status: Inpatient.   Eduard ClosArshad N Erika Hussar MD [facility] Pager [phone].  If 7PM-7AM, please contact night-coverage [email or web address] Password [email or web address]  11/12/2019, 11:41 PM

## 2019-11-13 ENCOUNTER — Encounter: Payer: Self-pay | Admitting: Internal Medicine

## 2019-11-13 DIAGNOSIS — R935 Abnormal findings on diagnostic imaging of other abdominal regions, including retroperitoneum: Secondary | ICD-10-CM | POA: Diagnosis not present

## 2019-11-13 DIAGNOSIS — R945 Abnormal results of liver function studies: Secondary | ICD-10-CM | POA: Diagnosis not present

## 2019-11-13 LAB — COMPREHENSIVE METABOLIC PANEL
ALT: 187 U/L — ABNORMAL HIGH (ref 0–44)
AST: 67 U/L — ABNORMAL HIGH (ref 15–41)
Albumin: 3 g/dL — ABNORMAL LOW (ref 3.5–5.0)
Alkaline Phosphatase: 226 U/L — ABNORMAL HIGH (ref 38–126)
Anion gap: 10 (ref 5–15)
BUN: 5 mg/dL — ABNORMAL LOW (ref 8–23)
CO2: 26 mmol/L (ref 22–32)
Calcium: 8.7 mg/dL — ABNORMAL LOW (ref 8.9–10.3)
Chloride: 106 mmol/L (ref 98–111)
Creatinine, Ser: <0.3 mg/dL — ABNORMAL LOW (ref 0.61–1.24)
Glucose, Bld: 127 mg/dL — ABNORMAL HIGH (ref 70–99)
Potassium: 3.1 mmol/L — ABNORMAL LOW (ref 3.5–5.1)
Sodium: 142 mmol/L (ref 135–145)
Total Bilirubin: 0.9 mg/dL (ref 0.3–1.2)
Total Protein: 6.8 g/dL (ref 6.5–8.1)

## 2019-11-13 LAB — TYPE AND SCREEN
ABO/RH(D): B POS
Antibody Screen: NEGATIVE

## 2019-11-13 LAB — MRSA PCR SCREENING: MRSA by PCR: NEGATIVE

## 2019-11-13 LAB — CBC
HCT: 42 % (ref 39.0–52.0)
Hemoglobin: 13.4 g/dL (ref 13.0–17.0)
MCH: 28.7 pg (ref 26.0–34.0)
MCHC: 31.9 g/dL (ref 30.0–36.0)
MCV: 89.9 fL (ref 80.0–100.0)
Platelets: 226 10*3/uL (ref 150–400)
RBC: 4.67 MIL/uL (ref 4.22–5.81)
RDW: 13.3 % (ref 11.5–15.5)
WBC: 3.6 10*3/uL — ABNORMAL LOW (ref 4.0–10.5)
nRBC: 0 % (ref 0.0–0.2)

## 2019-11-13 LAB — MAGNESIUM: Magnesium: 1.9 mg/dL (ref 1.7–2.4)

## 2019-11-13 LAB — PROTIME-INR
INR: 0.9 (ref 0.8–1.2)
Prothrombin Time: 11.3 seconds — ABNORMAL LOW (ref 11.4–15.2)

## 2019-11-13 LAB — C-REACTIVE PROTEIN: CRP: 3.1 mg/dL — ABNORMAL HIGH (ref <1.0)

## 2019-11-13 LAB — BRAIN NATRIURETIC PEPTIDE: B Natriuretic Peptide: 46.4 pg/mL (ref 0.0–100.0)

## 2019-11-13 LAB — PROCALCITONIN: Procalcitonin: 9.14 ng/mL

## 2019-11-13 MED ORDER — HYDRALAZINE HCL 20 MG/ML IJ SOLN
10.0000 mg | Freq: Four times a day (QID) | INTRAMUSCULAR | Status: DC | PRN
  Administered 2019-11-14 – 2019-11-15 (×2): 10 mg via INTRAVENOUS
  Filled 2019-11-13: qty 1

## 2019-11-13 MED ORDER — PANTOPRAZOLE SODIUM 40 MG IV SOLR
40.0000 mg | Freq: Two times a day (BID) | INTRAVENOUS | Status: DC
  Administered 2019-11-13 – 2019-11-16 (×7): 40 mg via INTRAVENOUS
  Filled 2019-11-13 (×7): qty 40

## 2019-11-13 MED ORDER — METOPROLOL TARTRATE 5 MG/5ML IV SOLN: 5.0000 mg | Freq: Three times a day (TID) | INTRAVENOUS | Status: DC | PRN

## 2019-11-13 MED ORDER — HEPARIN SODIUM (PORCINE) 5000 UNIT/ML IJ SOLN: 5000.0000 [IU] | Freq: Three times a day (TID) | INTRAMUSCULAR | Status: DC

## 2019-11-13 MED ORDER — HEPARIN SODIUM (PORCINE) 5000 UNIT/ML IJ SOLN
5000.0000 [IU] | Freq: Three times a day (TID) | INTRAMUSCULAR | Status: DC
  Administered 2019-11-13 – 2019-11-16 (×7): 5000 [IU] via SUBCUTANEOUS
  Filled 2019-11-13 (×8): qty 1

## 2019-11-13 MED ORDER — MORPHINE SULFATE (PF) 2 MG/ML IV SOLN
1.0000 mg | Freq: Four times a day (QID) | INTRAVENOUS | Status: DC | PRN
  Administered 2019-11-13 – 2019-11-16 (×6): 1 mg via INTRAVENOUS
  Filled 2019-11-13 (×6): qty 1

## 2019-11-13 MED ORDER — METOPROLOL SUCCINATE ER 50 MG PO TB24
50.0000 mg | ORAL_TABLET | Freq: Every day | ORAL | Status: DC
  Administered 2019-11-13 – 2019-11-16 (×3): 50 mg via ORAL
  Filled 2019-11-13 (×3): qty 1

## 2019-11-13 NOTE — Assessment & Plan Note (Addendum)
Difficult hx, etiology unclear, for labs and xray as ordered, and further recommendations pending results  I spent 31 minutes in preparing to see the patient by review of recent labs, imaging and procedures, obtaining and reviewing separately obtained history, communicating with the patient and family or caregiver, ordering medications, tests or procedures, and documenting clinical information in the EHR including the differential Dx, treatment, and any further evaluation and other management of abd pain, hyperglycemia, htn

## 2019-11-13 NOTE — Assessment & Plan Note (Signed)
stable overall by history and exam, recent data reviewed with pt, and pt to continue medical treatment as before,  to f/u any worsening symptoms or concerns  

## 2019-11-13 NOTE — Consult Note (Addendum)
Consultation  Referring Provider: TRH/Singh Primary Care Physician:  Biagio Borg, MD Primary Gastroenterologist:  Dr.Stark  Reason for Consultation:  CBD stones  HPI: Thomas Raymond is a 74 y.o. male who was admitted last evening after outpatient labs revealed elevated LFTs.  Patient has history of ALS and is now wheelchair-bound.  He had remote PEG placement.  He was seen in our office in 2015 to discuss screening colonoscopy but declined. He had hospitalization in 2017 for what was felt to be acute acalculous cholecystitis which was treated with antibiotics and a cholecystostomy tube. Work-up since admit showed labs T bili 1.7/alk phos 256/AST 159/ALT 277 CBC within normal limits, lipase within normal limits, creatinine 0.45, potassium 3.1, and INR 1.1. Limited abdominal ultrasound was done showing a contracted gallbladder with some stones and sludge 4 mm gallbladder wall mildly nodular liver contour and a 3 mm CBD. Subsequent CT of the abdomen and pelvis last night showed a normal-appearing liver, gallstones and intra and extrahepatic ductal dilation with CBD of 0.9 cm.  There are multiple stones in the distal common bile duct.  Patient and wife speaks some English, there was also a [place] staff member present during our conversation.  He is a difficult historian but it sounds as if he had had some recent left-sided abdominal pain and perhaps has had some recent mild epigastric pain but nothing that had been persistent.  He denies any pain over the past week.  Appetite has been fine, no nausea or vomiting, weight has been stable.  No fever or chills.   Past Medical History:  Diagnosis Date  . ALLERGIC RHINITIS   . ALS   . HYPERLIPIDEMIA   . HYPERTENSION   . Impaired glucose tolerance 02/17/2011  . LOW BACK PAIN   . VERTIGO     Past Surgical History:  Procedure Laterality Date  . COMPRESSION HIP SCREW Left 02/07/2014   Procedure: IM Nail;  Surgeon: Alta Corning, MD;   Location: [facility];  Service: Orthopedics;  Laterality: Left;  . IR GENERIC HISTORICAL  11/21/2015   IR RADIOLOGIST EVAL & MGMT 11/21/2015 Arne Cleveland, MD [facility] INTERV RAD  . Lipoma removal  2003  . Percutaneous gastrostomy tube repalcement     04/1999 and removed 07/1999  . TRACHEOSTOMY      Prior to Admission medications   Medication Sig Start Date End Date Taking? Authorizing Provider  amLODipine (NORVASC) 10 MG tablet Take 1 tablet (10 mg total) by mouth daily. 09/09/19  Yes Biagio Borg, MD  cetirizine (ZYRTEC) 10 MG tablet TAKE 1 TABLET BY MOUTH EVERY DAY Patient taking differently: Take 10 mg by mouth daily.  10/30/19  Yes Biagio Borg, MD  metoprolol succinate (TOPROL-XL) 50 MG 24 hr tablet Take 1 tablet (50 mg total) by mouth daily. 07/01/19  Yes Biagio Borg, MD  omeprazole (PRILOSEC) 20 MG capsule Take 1 capsule (20 mg total) by mouth daily. 10/18/19  Yes Biagio Borg, MD  rosuvastatin (CRESTOR) 20 MG tablet TAKE 1 TABLET BY MOUTH EVERY DAY Patient taking differently: Take 20 mg by mouth daily.  10/18/19  Yes Biagio Borg, MD  acetaminophen (TYLENOL) 325 MG tablet Take 2 tablets (650 mg total) by mouth every 6 (six) hours as needed for mild pain (or temp > 100). Patient not taking: Reported on 11/12/2019 07/21/15   Riebock, Estill Bakes, NP  albuterol (PROVENTIL HFA;VENTOLIN HFA) 108 (90 Base) MCG/ACT inhaler Inhale 2 puffs into the lungs every 6 (six)  hours as needed for wheezing or shortness of breath. Patient not taking: Reported on 11/12/2019 10/21/15   Dhungel, Flonnie Overman, MD  meclizine (ANTIVERT) 12.5 MG tablet TAKE 1 TABLET BY MOUTH 3 TIMES A DAY AS NEEDED FOR DIZZINESS Patient not taking: Reported on 11/12/2019 07/27/19   Biagio Borg, MD    Current Facility-Administered Medications  Medication Dose Route Frequency Provider Last Rate Last Admin  . dextrose 5 %-0.9 % sodium chloride infusion   Intravenous Continuous Rise Patience, MD 75 mL/hr at 11/13/19 0018 New Bag at 11/13/19 0018    . heparin injection 5,000 Units  5,000 Units Subcutaneous Q8H Lala Lund K, MD      . hydrALAZINE (APRESOLINE) injection 10 mg  10 mg Intravenous Q6H PRN Thurnell Lose, MD      . metoprolol succinate (TOPROL-XL) 24 hr tablet 50 mg  50 mg Oral Daily Thurnell Lose, MD   50 mg at 11/13/19 0927  . metoprolol tartrate (LOPRESSOR) injection 5 mg  5 mg Intravenous Q8H PRN Thurnell Lose, MD      . ondansetron [facility]) injection 4 mg  4 mg Intravenous Q6H PRN Rise Patience, MD      . pantoprazole (PROTONIX) injection 40 mg  40 mg Intravenous Q12H Thurnell Lose, MD   40 mg at 11/13/19 0928  . sodium chloride flush (NS) 0.9 % injection 3 mL  3 mL Intravenous Once Rise Patience, MD        Allergies as of 11/12/2019  . (No Known Allergies)    Family History  Problem Relation Age of Onset  . Other Mother        Deceased  . Other Father        Deceased  . Other Son 2       x 2 starvation/illness from refugee [place]  . Other Daughter 2       x 2 starvation/illness from refugee [place]    Social History   Socioeconomic History  . Marital status: Widowed    Spouse name: Not on file  . Number of children: 4 D  . Years of education: Not on file  . Highest education level: Not on file  Occupational History  . Occupation: disabled  Tobacco Use  . Smoking status: Never Smoker  . Smokeless tobacco: Never Used  Substance and Sexual Activity  . Alcohol use: No  . Drug use: No  . Sexual activity: Never  Other Topics Concern  . Not on file  Social History Narrative   He lives with wife.   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   He moved from [place] in 1985.   Social Determinants of Health   Financial Resource Strain:   . Difficulty of Paying Living Expenses:   Food Insecurity:   . Worried About Charity fundraiser in the Last Year:   . Arboriculturist in the Last Year:    Transportation Needs:   . Film/video editor (Medical):   [name] Lack of Transportation (Non-Medical):   Physical Activity:   . Days of Exercise per Week:   . Minutes of Exercise per Session:   Stress:   . Feeling of Stress :   Social Connections:   . Frequency of Communication with Friends and Family:   . Frequency of Social Gatherings with Friends and Family:   . Attends Religious Services:   .  Active Member of Clubs or Organizations:   . Attends Archivist Meetings:   [name] Marital Status:   Intimate Partner Violence:   . Fear of Current or Ex-Partner:   . Emotionally Abused:   [name] Physically Abused:   . Sexually Abused:     Review of Systems: Pertinent positive and negative review of systems were noted in the above HPI section.  All other review of systems was otherwise negative.  Physical Exam: Vital signs in last 24 hours: Temp:  [98 F (36.7 C)] 98 F (36.7 C) (07/24 0023) Pulse Rate:  [56-79] 62 (07/24 0927) Resp:  [14-24] 18 (07/24 0023) BP: (153-177)/(62-93) 169/70 (07/24 0927) SpO2:  [97 %-100 %] 97 % (07/24 0023) Weight:  [63.6 kg] 63.6 kg (07/24 0023) Last BM Date: 11/11/19 General:   Alert,  Well-developed, elderly Asian male, pleasant and cooperative in NAD Wife at bedside Head:  Normocephalic and atraumatic. Eyes:  Sclera early icterus.   Conjunctiva pink. Ears:  Normal auditory acuity. Nose:  No deformity, discharge,  or lesions. Mouth:  No deformity or lesions.   Neck:  Supple; no masses or thyromegaly. Lungs:  Clear throughout to auscultation.   No wheezes, crackles, or rhonchi. Heart:  Regular rate and rhythm; no murmurs, clicks, rubs,  or gallops. Abdomen:  Soft,nontender, BS active,nonpalp mass or hsm.   Rectal:  Deferred  Msk:  Symmetrical without gross deformities. . Pulses:  Normal pulses noted. Extremities: Muscular atrophy Neurologic:  Alert and  oriented x4; wheelchair-bound Skin:  Intact without significant lesions or  rashes.. Psych:  Alert and cooperative. Normal mood and affect.  Intake/Output from previous day: 07/23 0701 - 07/24 0700 In: 424.3 [I.V.:424.3] Out: -  Intake/Output this shift: No intake/output data recorded.  Lab Results: Recent Labs    11/11/19 1358 11/12/19 0944 11/13/19 0829  WBC 7.9 4.7 3.6*  HGB 14.6 13.9 13.4  HCT 45.1 44.4 42.0  PLT 233 226 226   BMET Recent Labs    11/11/19 1357 11/12/19 0944 11/13/19 0829  NA 137 138 142  K 3.4* 3.1* 3.1*  CL 99 101 106  CO2 25 28 26   GLUCOSE 92 138* 127*  BUN 10 9 5*  CREATININE 0.33* 0.45* <0.30*  CALCIUM 9.3 9.1 8.7*   LFT Recent Labs    11/13/19 0829  PROT 6.8  ALBUMIN 3.0*  AST 67*  ALT 187*  ALKPHOS 226*  BILITOT 0.9   PT/INR Recent Labs    11/13/19 0829  LABPROT 11.3*  INR 0.9   Hepatitis Panel No results for input(s): HEPBSAG, HCVAB, HEPAIGM, HEPBIGM in the last 72 hours.       IMPRESSION:  #44 74 year old [place] male noted to have abnormal LFTs on outpatient labs, and subsequent work-up overnight consistent with cholelithiasis and choledocholithiasis. Patient has not had any acute abdominal pain and choledocholithiasis likely subacute or chronic. No evidence for cholangitis.   #2 History of a calculus cholecystitis 2017 managed with antibiotics and percutaneous cholecystostomy 3.  Remote history of PEG 4.  Progressive neuromuscular atrophy disorder/ALS currently wheelchair-bound 5.  Hypertension  Plan; allow diet today, n.p.o. after midnight  MRCP had been ordered - not needed, have cancelled Patient will be scheduled for ERCP with stone extraction with Dr. Henrene Pastor for tomorrow 11/14/2019.  Procedure was discussed in detail with the patient and his wife via staff member/interpreter, and they are agreeable to proceed.  We will also plan to discuss with his daughter who is an employee here at [facility] later  in the day.  Thank you, will follow along   Amy Esterwood PA-C 11/13/2019, 10:53  AM  GI ATTENDING  History, laboratories, x-rays reviewed.  Patient seen and examined.  Agree with comprehensive consultation note as outlined above.  Patient with multiple significant medical problems presents with abdominal pain, abnormal liver test, and CT imaging showing choledocholithiasis.  He is stable.  Tentative plans for ERCP with sphincterotomy and stone extraction tomorrow.The nature of the procedure, as well as the risks, benefits, and alternatives were carefully and thoroughly reviewed with the patient by Nicoletta Ba PA-C. Ample time for discussion and questions allowed. The patient understood, was satisfied, and agreed to proceed.  Docia Chuck. Geri Seminole., M.D. [facility] Division of Gastroenterology

## 2019-11-13 NOTE — H&P (View-Only) (Signed)
Consultation  Referring Provider: TRH/Singh Primary Care Physician:  Biagio Borg, MD Primary Gastroenterologist:  Dr.Stark  Reason for Consultation:  CBD stones  HPI: Thomas Raymond is a 74 y.o. male who was admitted last evening after outpatient labs revealed elevated LFTs.  Patient has history of ALS and is now wheelchair-bound.  He had remote PEG placement.  He was seen in our office in 2015 to discuss screening colonoscopy but declined. He had hospitalization in 2017 for what was felt to be acute acalculous cholecystitis which was treated with antibiotics and a cholecystostomy tube. Work-up since admit showed labs T bili 1.7/alk phos 256/AST 159/ALT 277 CBC within normal limits, lipase within normal limits, creatinine 0.45, potassium 3.1, and INR 1.1. Limited abdominal ultrasound was done showing a contracted gallbladder with some stones and sludge 4 mm gallbladder wall mildly nodular liver contour and a 3 mm CBD. Subsequent CT of the abdomen and pelvis last night showed a normal-appearing liver, gallstones and intra and extrahepatic ductal dilation with CBD of 0.9 cm.  There are multiple stones in the distal common bile duct.  Patient and wife speaks some English, there was also a [place] staff member present during our conversation.  He is a difficult historian but it sounds as if he had had some recent left-sided abdominal pain and perhaps has had some recent mild epigastric pain but nothing that had been persistent.  He denies any pain over the past week.  Appetite has been fine, no nausea or vomiting, weight has been stable.  No fever or chills.   Past Medical History:  Diagnosis Date  . ALLERGIC RHINITIS   . ALS   . HYPERLIPIDEMIA   . HYPERTENSION   . Impaired glucose tolerance 02/17/2011  . LOW BACK PAIN   . VERTIGO     Past Surgical History:  Procedure Laterality Date  . COMPRESSION HIP SCREW Left 02/07/2014   Procedure: IM Nail;  Surgeon: Alta Corning, MD;   Location: [facility];  Service: Orthopedics;  Laterality: Left;  . IR GENERIC HISTORICAL  11/21/2015   IR RADIOLOGIST EVAL & MGMT 11/21/2015 Arne Cleveland, MD [facility] INTERV RAD  . Lipoma removal  2003  . Percutaneous gastrostomy tube repalcement     04/1999 and removed 07/1999  . TRACHEOSTOMY      Prior to Admission medications   Medication Sig Start Date End Date Taking? Authorizing Provider  amLODipine (NORVASC) 10 MG tablet Take 1 tablet (10 mg total) by mouth daily. 09/09/19  Yes Biagio Borg, MD  cetirizine (ZYRTEC) 10 MG tablet TAKE 1 TABLET BY MOUTH EVERY DAY Patient taking differently: Take 10 mg by mouth daily.  10/30/19  Yes Biagio Borg, MD  metoprolol succinate (TOPROL-XL) 50 MG 24 hr tablet Take 1 tablet (50 mg total) by mouth daily. 07/01/19  Yes Biagio Borg, MD  omeprazole (PRILOSEC) 20 MG capsule Take 1 capsule (20 mg total) by mouth daily. 10/18/19  Yes Biagio Borg, MD  rosuvastatin (CRESTOR) 20 MG tablet TAKE 1 TABLET BY MOUTH EVERY DAY Patient taking differently: Take 20 mg by mouth daily.  10/18/19  Yes Biagio Borg, MD  acetaminophen (TYLENOL) 325 MG tablet Take 2 tablets (650 mg total) by mouth every 6 (six) hours as needed for mild pain (or temp > 100). Patient not taking: Reported on 11/12/2019 07/21/15   Riebock, Estill Bakes, NP  albuterol (PROVENTIL HFA;VENTOLIN HFA) 108 (90 Base) MCG/ACT inhaler Inhale 2 puffs into the lungs every 6 (six)  hours as needed for wheezing or shortness of breath. Patient not taking: Reported on 11/12/2019 10/21/15   Dhungel, Flonnie Overman, MD  meclizine (ANTIVERT) 12.5 MG tablet TAKE 1 TABLET BY MOUTH 3 TIMES A DAY AS NEEDED FOR DIZZINESS Patient not taking: Reported on 11/12/2019 07/27/19   Biagio Borg, MD    Current Facility-Administered Medications  Medication Dose Route Frequency Provider Last Rate Last Admin  . dextrose 5 %-0.9 % sodium chloride infusion   Intravenous Continuous Rise Patience, MD 75 mL/hr at 11/13/19 0018 New Bag at 11/13/19 0018    . heparin injection 5,000 Units  5,000 Units Subcutaneous Q8H Lala Lund K, MD      . hydrALAZINE (APRESOLINE) injection 10 mg  10 mg Intravenous Q6H PRN Thurnell Lose, MD      . metoprolol succinate (TOPROL-XL) 24 hr tablet 50 mg  50 mg Oral Daily Thurnell Lose, MD   50 mg at 11/13/19 0927  . metoprolol tartrate (LOPRESSOR) injection 5 mg  5 mg Intravenous Q8H PRN Thurnell Lose, MD      . ondansetron [facility]) injection 4 mg  4 mg Intravenous Q6H PRN Rise Patience, MD      . pantoprazole (PROTONIX) injection 40 mg  40 mg Intravenous Q12H Thurnell Lose, MD   40 mg at 11/13/19 0928  . sodium chloride flush (NS) 0.9 % injection 3 mL  3 mL Intravenous Once Rise Patience, MD        Allergies as of 11/12/2019  . (No Known Allergies)    Family History  Problem Relation Age of Onset  . Other Mother        Deceased  . Other Father        Deceased  . Other Son 2       x 2 starvation/illness from refugee [place]  . Other Daughter 2       x 2 starvation/illness from refugee [place]    Social History   Socioeconomic History  . Marital status: Widowed    Spouse name: Not on file  . Number of children: 4 D  . Years of education: Not on file  . Highest education level: Not on file  Occupational History  . Occupation: disabled  Tobacco Use  . Smoking status: Never Smoker  . Smokeless tobacco: Never Used  Substance and Sexual Activity  . Alcohol use: No  . Drug use: No  . Sexual activity: Never  Other Topics Concern  . Not on file  Social History Narrative   He lives with wife.   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   He moved from [place] in 1985.   Social Determinants of Health   Financial Resource Strain:   . Difficulty of Paying Living Expenses:   Food Insecurity:   . Worried About Charity fundraiser in the Last Year:   . Arboriculturist in the Last Year:    Transportation Needs:   . Film/video editor (Medical):   [name] Lack of Transportation (Non-Medical):   Physical Activity:   . Days of Exercise per Week:   . Minutes of Exercise per Session:   Stress:   . Feeling of Stress :   Social Connections:   . Frequency of Communication with Friends and Family:   . Frequency of Social Gatherings with Friends and Family:   . Attends Religious Services:   .  Active Member of Clubs or Organizations:   . Attends Archivist Meetings:   [name] Marital Status:   Intimate Partner Violence:   . Fear of Current or Ex-Partner:   . Emotionally Abused:   [name] Physically Abused:   . Sexually Abused:     Review of Systems: Pertinent positive and negative review of systems were noted in the above HPI section.  All other review of systems was otherwise negative.  Physical Exam: Vital signs in last 24 hours: Temp:  [98 F (36.7 C)] 98 F (36.7 C) (07/24 0023) Pulse Rate:  [56-79] 62 (07/24 0927) Resp:  [14-24] 18 (07/24 0023) BP: (153-177)/(62-93) 169/70 (07/24 0927) SpO2:  [97 %-100 %] 97 % (07/24 0023) Weight:  [63.6 kg] 63.6 kg (07/24 0023) Last BM Date: 11/11/19 General:   Alert,  Well-developed, elderly Asian male, pleasant and cooperative in NAD Wife at bedside Head:  Normocephalic and atraumatic. Eyes:  Sclera early icterus.   Conjunctiva pink. Ears:  Normal auditory acuity. Nose:  No deformity, discharge,  or lesions. Mouth:  No deformity or lesions.   Neck:  Supple; no masses or thyromegaly. Lungs:  Clear throughout to auscultation.   No wheezes, crackles, or rhonchi. Heart:  Regular rate and rhythm; no murmurs, clicks, rubs,  or gallops. Abdomen:  Soft,nontender, BS active,nonpalp mass or hsm.   Rectal:  Deferred  Msk:  Symmetrical without gross deformities. . Pulses:  Normal pulses noted. Extremities: Muscular atrophy Neurologic:  Alert and  oriented x4; wheelchair-bound Skin:  Intact without significant lesions or  rashes.. Psych:  Alert and cooperative. Normal mood and affect.  Intake/Output from previous day: 07/23 0701 - 07/24 0700 In: 424.3 [I.V.:424.3] Out: -  Intake/Output this shift: No intake/output data recorded.  Lab Results: Recent Labs    11/11/19 1358 11/12/19 0944 11/13/19 0829  WBC 7.9 4.7 3.6*  HGB 14.6 13.9 13.4  HCT 45.1 44.4 42.0  PLT 233 226 226   BMET Recent Labs    11/11/19 1357 11/12/19 0944 11/13/19 0829  NA 137 138 142  K 3.4* 3.1* 3.1*  CL 99 101 106  CO2 25 28 26   GLUCOSE 92 138* 127*  BUN 10 9 5*  CREATININE 0.33* 0.45* <0.30*  CALCIUM 9.3 9.1 8.7*   LFT Recent Labs    11/13/19 0829  PROT 6.8  ALBUMIN 3.0*  AST 67*  ALT 187*  ALKPHOS 226*  BILITOT 0.9   PT/INR Recent Labs    11/13/19 0829  LABPROT 11.3*  INR 0.9   Hepatitis Panel No results for input(s): HEPBSAG, HCVAB, HEPAIGM, HEPBIGM in the last 72 hours.       IMPRESSION:  #4 74 year old [place] male noted to have abnormal LFTs on outpatient labs, and subsequent work-up overnight consistent with cholelithiasis and choledocholithiasis. Patient has not had any acute abdominal pain and choledocholithiasis likely subacute or chronic. No evidence for cholangitis.   #2 History of a calculus cholecystitis 2017 managed with antibiotics and percutaneous cholecystostomy 3.  Remote history of PEG 4.  Progressive neuromuscular atrophy disorder/ALS currently wheelchair-bound 5.  Hypertension  Plan; allow diet today, n.p.o. after midnight  MRCP had been ordered - not needed, have cancelled Patient will be scheduled for ERCP with stone extraction with Dr. Henrene Pastor for tomorrow 11/14/2019.  Procedure was discussed in detail with the patient and his wife via staff member/interpreter, and they are agreeable to proceed.  We will also plan to discuss with his daughter who is an employee here at [facility] later  in the day.  Thank you, will follow along   Amy Esterwood PA-C 11/13/2019, 10:53  AM  GI ATTENDING  History, laboratories, x-rays reviewed.  Patient seen and examined.  Agree with comprehensive consultation note as outlined above.  Patient with multiple significant medical problems presents with abdominal pain, abnormal liver test, and CT imaging showing choledocholithiasis.  He is stable.  Tentative plans for ERCP with sphincterotomy and stone extraction tomorrow.The nature of the procedure, as well as the risks, benefits, and alternatives were carefully and thoroughly reviewed with the patient by Nicoletta Ba PA-C. Ample time for discussion and questions allowed. The patient understood, was satisfied, and agreed to proceed.  Docia Chuck. Geri Seminole., M.D. [facility] Division of Gastroenterology

## 2019-11-13 NOTE — Plan of Care (Signed)
New admit

## 2019-11-13 NOTE — Evaluation (Signed)
Physical Therapy Evaluation and Discharge Patient Details Name: Thomas Raymond MRN: [id number] DOB: 1945-05-03 Today's Date: 11/13/2019   History of Present Illness  74 y.o. male with history of ALS wheelchair-bound with weakness of both lower extremities presents to the ER after patient was found to have abnormal labs which were done as a routine work-up yesterday by patient's primary care physician. CT abdomen and pelvis showed multiple gallstones in the common bile duct. Plan is for ERCP 11/14/19.   Clinical Impression   Patient and wife present. Patient declined need for interpreter. They report his goal for PT is to walk again, however when asked when he last walked, they both said "a very, very, very long time ago." Explained that he will not be able to walk again. Discussed how he transfers into his power chair at home (total assist sliding laterally from bed with elevated HOB into chair). Noted MD order questioned if patient would need placement/long-term care. After asking the pt and wife if this is something they need, they began talking very loudly in [name] to each other. Nurse tech entered room and able to translate. They would like to know the cost of long-term care and how long he would have to stay. Explained he would not qualify for PT at SNF and that Care Management team could perhaps provide information on long-term care. More loud talking and then they ultimately said what they want is a nursing aide to come several hours per day--as they had in the past. They stated services stopped when they went to visit family for 4 months and have not been re-started. Patient and wife repeatedly talking over each other and nurse tech recommended PT call their daughter to discuss needs. Attempted to reach daughter Tour manager) twice with no answer.   No further PT needs identified and PT is discharging patient.     Follow Up Recommendations No PT follow up;Other (comment) (?qualifies for nursing  aide?)    Equipment Recommendations  None recommended by PT    Recommendations for Other Services Other (comment) (Case Management for ?home services)     Precautions / Restrictions Precautions Precautions: Fall      Mobility  Bed Mobility Overal bed mobility: Needs Assistance             General bed mobility comments: total assist  Transfers                 General transfer comment: not assessed  Ambulation/Gait             General Gait Details: reports he wants to learn to walk, and when asked says it's been a very, very, very long time since he stood on his feet/legs  Stairs            Wheelchair Mobility    Modified Rankin (Stroke Patients Only)       Balance                                             Pertinent Vitals/Pain Pain Assessment: Faces Faces Pain Scale: Hurts even more Pain Location: left hip Pain Descriptors / Indicators: Discomfort Pain Intervention(s): Patient requesting pain meds-RN notified    Home Living Family/patient expects to be discharged to:: Unsure Living Arrangements: Spouse/significant other               Additional Comments: Per pt, wife,  and in-person staff interpreter they are asking if patient goes to a skilled facility how long he would stay and how much would it cost. Per interview, appears pt no longer qualifies for therapy. They deny a decline in his functional status and state that it has been a "very, very long time" since he was able to walk and has been using a power chair. Difficult for in-person interpreter to interpret as pt and wife constantly talking over each other. Interpreter recommended (and pt and wife agreed) that PT call daughter to assist with whether family wants SNF or wants pt to go home. Attempted to reach daughter twice without success.     Prior Function Level of Independence: Needs assistance   Gait / Transfers Assistance Needed: reports no longer stands  to get in/out of wheelchair. They elevate the [facility] on home hospital bed and slide his sideways into his electric chair.            Hand Dominance        Extremity/Trunk Assessment   Upper Extremity Assessment Upper Extremity Assessment: Defer to OT evaluation    Lower Extremity Assessment Lower Extremity Assessment: RLE deficits/detail;LLE deficits/detail RLE Deficits / Details: can wiggle toes LLE Deficits / Details: can wiggle toes       Communication   Communication: Prefers language other than [place];Interpreter utilized (pt initially refused interpreter; staff member later interpr)  Cognition Arousal/Alertness: Awake/alert Behavior During Therapy: WFL for tasks assessed/performed Overall Cognitive Status: Within Functional Limits for tasks assessed                                        General Comments General comments (skin integrity, edema, etc.): Patient refused interpreter services. At times     Exercises     Assessment/Plan    PT Assessment Patent does not need any further PT services  PT Problem List         PT Treatment Interventions      PT Goals (Current goals can be found in the Care Plan section)  Acute Rehab PT Goals Patient Stated Goal: to walk again PT Goal Formulation: All assessment and education complete, DC therapy    Frequency     Barriers to discharge        Co-evaluation               AM-PAC PT "6 Clicks" Mobility  Outcome Measure Help needed turning from your back to your side while in a flat bed without using bedrails?: Total Help needed moving from lying on your back to sitting on the side of a flat bed without using bedrails?: Total Help needed moving to and from a bed to a chair (including a wheelchair)?: Total Help needed standing up from a chair using your arms (e.g., wheelchair or bedside chair)?: Total Help needed to walk in hospital room?: Total Help needed climbing 3-5 steps with a railing?  : Total 6 Click Score: 6    End of Session   Activity Tolerance: Patient tolerated treatment well Patient left: in bed;with nursing/sitter in room;with family/visitor present Nurse Communication: Patient requests pain meds PT Visit Diagnosis: Other symptoms and signs involving the nervous system (R29.898)    Time: 1238-1300 PT Time Calculation (min) (ACUTE ONLY): 22 min   Charges:   PT Evaluation $PT Eval Low Complexity: 1 Low  Jerolyn Center, PT Pager [phone]   Zena Amos 11/13/2019, 2:43 PM

## 2019-11-13 NOTE — Progress Notes (Signed)
PROGRESS NOTE                                                                                                                                                                                                             Patient Demographics:    Thomas Raymond, is a 74 y.o. male, DOB - 09/19/1945, GNF:[id number]  Admit date - 11/12/2019   Admitting Physician Eduard ClosArshad N Kakrakandy, MD  Outpatient Primary MD for the patient is Thomas Raymond, Thomas W, MD  LOS - 1  Chief Complaint  Patient presents with  . Abnormal Lab       Brief Narrative  - Thomas Thomas Raymond is a 74 y.o. male with history of ALS wheelchair-bound with weakness of both lower extremities presents to the ER after patient was found to have abnormal labs which were done as a routine work-up yesterday by patient's primary care physician.     Subjective:    [facility], No headache, No chest pain, no abdominal pain - No Nausea, No new weakness tingling or numbness, No Cough - SOB.     Assessment  & Plan :      1.  Elevated LFTs with total bilirubin found incidentally on lab work.  Symptom-free, CT suspicious for choledocholithiasis, repeat labs show improvement in liver enzymes and a bilirubin level suggesting could have passed CBD stone.  Would obtain MRCP, n.p.o. except medications, GI consult.  May require ERCP and cholecystectomy as well.  2.  ALS.  Wheelchair and bedbound.  Supportive care.  3.  Hypertension.  Placed on beta-blocker.     Condition - Extremely Guarded  Family Communication  :  None  Code Status :  Full  Consults  :  GI  Procedures  :    CT - 1. Findings are consistent with choledocholithiasis with intrahepatic and extrahepatic biliary ductal dilatation. 2. Cholelithiasis without acute inflammation. 3. Cardiomegaly. Aortic Atherosclerosis (ICD10-I70.0).  PUD Prophylaxis : PPI  Disposition Plan  :    Status is: Inpatient  Remains inpatient appropriate because:IV treatments appropriate due to  intensity of illness or inability to take PO   Dispo: The patient is from: Home              Anticipated d/c is to: Home              Anticipated d/c date is: 3 days              Patient currently is not medically stable to d/c.  DVT Prophylaxis  :  Heparin - SCDs   Lab Results  Component Value Date   PLT 226 11/12/2019    Diet :  Diet Order            Diet NPO time specified Except for: Sips with Meds  Diet effective now                  Inpatient Medications Scheduled Meds: . metoprolol succinate  50 mg Oral Daily  . pantoprazole (PROTONIX) IV  40 mg Intravenous Q12H  . sodium chloride flush  3 mL Intravenous Once   Continuous Infusions: . dextrose 5 % and 0.9% NaCl 75 mL/hr at 11/13/19 0018   PRN Meds:.hydrALAZINE, metoprolol tartrate, [DISCONTINUED] ondansetron **OR** ondansetron (ZOFRAN) IV  Antibiotics  :   Anti-infectives (From admission, onward)   None          Objective:   Vitals:   11/12/19 1945 11/12/19 2015 11/12/19 2315 11/13/19 0023  BP: (!) 169/71 (!) 163/79 (!) 177/65 (!) 153/75  Pulse: 79 72 60 56  Resp: (!) 24 18 14 18   Temp:    98 F (36.7 C)  TempSrc:    Oral  SpO2: 99% 98% 97% 97%  Weight:    63.6 kg  Height:    5\' 1"  (1.549 m)    SpO2: 97 %  Wt Readings from Last 3 Encounters:  11/13/19 63.6 kg  09/19/16 68.5 kg  05/30/16 70.3 kg     Intake/Output Summary (Last 24 hours) at 11/13/2019 0846 Last data filed at 11/13/2019 0600 Gross per 24 hour  Intake 424.25 ml  Output --  Net 424.25 ml     Physical Exam  Awake Alert, No new F.N deficits, at baseline he is bedbound and wheelchair-bound  [place].AT,PERRAL, mild scleral icterus Supple Neck,No JVD, No cervical lymphadenopathy appriciated.  Symmetrical Chest wall movement, Good air movement bilaterally, CTAB RRR,No Gallops,Rubs or new Murmurs, No Parasternal Heave +ve B.Sounds, Abd Soft, No tenderness, No organomegaly appriciated, No rebound - guarding or rigidity. No  Cyanosis, Clubbing or edema, No new Rash or bruise        Data Review:    Recent Labs  Lab 11/11/19 1358 11/12/19 0944  WBC 7.9 4.7  HGB 14.6 13.9  HCT 45.1 44.4  PLT 233 226  MCV 90.0 92.9  MCH 29.1 29.1  MCHC 32.4 31.3  RDW 12.8 13.5  LYMPHSABS 932  --   EOSABS 24  --   BASOSABS 32  --     Recent Labs  Lab 11/11/19 1357 11/12/19 0944  NA 137 138  K 3.4* 3.1*  CL 99 101  CO2 25 28  GLUCOSE 92 138*  BUN 10 9  CREATININE 0.33* 0.45*  CALCIUM 9.3 9.1  AST 280* 159*  ALT 402* 277*  ALKPHOS  --  256*  BILITOT 3.1* 1.7*  ALBUMIN  --  3.2*    Recent Labs  Lab 11/12/19 2120  SARSCOV2NAA NEGATIVE    ------------------------------------------------------------------------------------------------------------------ No results for input(s): CHOL, HDL, LDLCALC, TRIG, CHOLHDL, LDLDIRECT in the last 72 hours.  Lab Results  Component Value Date   HGBA1C 5.8 08/27/2019   ------------------------------------------------------------------------------------------------------------------ No results for input(s): TSH, T4TOTAL, T3FREE, THYROIDAB in the last 72 hours.  Invalid input(s): FREET3 ------------------------------------------------------------------------------------------------------------------ No results for input(s): VITAMINB12, FOLATE, FERRITIN, TIBC, IRON, RETICCTPCT in the last 72 hours.  Coagulation profile No results for input(s): INR, PROTIME in the last 168 hours.  No results for input(s): DDIMER in the last 72  hours.  Cardiac Enzymes No results for input(s): CKMB, TROPONINI, MYOGLOBIN in the last 168 hours.  Invalid input(s): CK ------------------------------------------------------------------------------------------------------------------ No results found for: BNP  Micro Results Recent Results (from the past 240 hour(s))  Urine Culture     Status: None   Collection Time: 11/11/19  1:58 PM   Specimen: Blood  Result Value Ref Range Status     Source: NOT GIVEN  Final   Status: FINAL  Final   Isolate 1:   Final    Growth of mixed flora was isolated, suggesting probable contamination. No further testing will be performed. If clinically indicated, recollection using a method to minimize contamination, with prompt transfer to Urine Culture Transport Tube, is  recommended.   SARS Coronavirus 2 by RT PCR (hospital order, performed in [facility] hospital lab) Nasopharyngeal Nasopharyngeal Swab     Status: None   Collection Time: 11/12/19  9:20 PM   Specimen: Nasopharyngeal Swab  Result Value Ref Range Status   SARS Coronavirus 2 NEGATIVE NEGATIVE Final    Comment: (NOTE) SARS-CoV-2 target nucleic acids are NOT DETECTED.  The SARS-CoV-2 RNA is generally detectable in upper and lower respiratory specimens during the acute phase of infection. The lowest concentration of SARS-CoV-2 viral copies this assay can detect is 250 copies / mL. A negative result does not preclude SARS-CoV-2 infection and should not be used as the sole basis for treatment or other patient management decisions.  A negative result may occur with improper specimen collection / handling, submission of specimen other than nasopharyngeal swab, presence of viral mutation(s) within the areas targeted by this assay, and inadequate number of viral copies (<250 copies / mL). A negative result must be combined with clinical observations, patient history, and epidemiological information.  Fact Sheet for Patients:   BoilerBrush.com.cy  Fact Sheet for Healthcare Providers: https://pope.com/  This test is not yet approved or  cleared by the [place] FDA and has been authorized for detection and/or diagnosis of SARS-CoV-2 by FDA under an Emergency Use Authorization (EUA).  This EUA will remain in effect (meaning this test can be used) for the duration of the COVID-19 declaration under Section 564(b)(1) of the Act, 21  U.S.C. section 360bbb-3(b)(1), unless the authorization is terminated or revoked sooner.  Performed at [facility], [address] [place], [place] [address]     Radiology Reports CT ABDOMEN PELVIS Raymond CONTRAST  Result Date: 11/12/2019 CLINICAL DATA:  Diverticulitis. Elevated liver enzymes with left lower quadrant pain. EXAM: CT ABDOMEN AND PELVIS WITH CONTRAST TECHNIQUE: Multidetector CT imaging of the abdomen and pelvis was performed using the standard protocol following bolus administration of intravenous contrast. CONTRAST:  OMNIPAQUE IOHEXOL 300 MG/ML  SOLN COMPARISON:  Ultrasound from the same day. CT dated October 16, 2015. CT dated November 21, 2015. FINDINGS: Lower chest: The lung bases are clear. The heart is enlarged. Hepatobiliary: The liver is normal. Cholelithiasis without acute inflammation.There is intrahepatic and extrahepatic biliary ductal dilatation. The common bile duct measures up to approximately 0.9 cm. Multiple filling defects are noted within the distal common bile duct (coronal series 6, image 57). Pancreas: Normal contours without ductal dilatation. No peripancreatic fluid collection. Spleen: Unremarkable. Adrenals/Urinary Tract: --Adrenal glands: Unremarkable. --Right kidney/ureter: No hydronephrosis or radiopaque kidney stones. --Left kidney/ureter: No hydronephrosis or radiopaque kidney stones. --Urinary bladder: Unremarkable. Stomach/Bowel: --Stomach/Duodenum: No hiatal hernia or other gastric abnormality. Normal duodenal course and caliber. --Small bowel: Unremarkable. --Colon: Unremarkable. --Appendix: Normal. Vascular/Lymphatic: Atherosclerotic calcification is present within  the non-aneurysmal abdominal aorta, without hemodynamically significant stenosis. --No retroperitoneal lymphadenopathy. --No mesenteric lymphadenopathy. --No pelvic or inguinal lymphadenopathy. Reproductive: Unremarkable Other: No ascites or free air. The abdominal wall is normal.  Musculoskeletal. The patient is status post prior left-sided intramedullary nail placement through the left femur. IMPRESSION: 1. Findings are consistent with choledocholithiasis with intrahepatic and extrahepatic biliary ductal dilatation. 2. Cholelithiasis without acute inflammation. 3. Cardiomegaly. Aortic Atherosclerosis (ICD10-I70.0). Electronically Signed   By: Katherine Mantle M.D.   On: 11/12/2019 19:37   DG Abd Acute Raymond/Chest  Result Date: 11/12/2019 CLINICAL DATA:  Abdominal pain. EXAM: DG ABDOMEN ACUTE Raymond/ 1V CHEST COMPARISON:  11/21/2015 FINDINGS: There is elevation of the right hemidiaphragm. The heart size is normal. Aortic calcifications are noted. There is no significant pleural effusion or focal infiltrate. The bowel gas pattern is nonobstructive. There are no radiopaque kidney stones. IMPRESSION: Negative abdominal radiographs.  No acute cardiopulmonary disease. Electronically Signed   By: Katherine Mantle M.D.   On: 11/12/2019 19:38   US Abdomen Limited RUQ  Result Date: 11/12/2019 CLINICAL DATA:  Elevated LFTs, recurrent acalculous cholecystitis. EXAM: ULTRASOUND ABDOMEN LIMITED RIGHT UPPER QUADRANT COMPARISON:  Ultrasound 10/17/2015, CT 12/11/2015 the yeah sounds go thanks neck as the FINDINGS: Gallbladder: Gallbladder is poorly visualized largely contracted around echogenic stones and biliary sludge, largest calculus measuring up to 3 mm in maximal dimension with posterior shadowing. Wall is borderline thickened at 4 mm though possibility of to the underdistention. Sonographic Murphy sign reportedly negative by sonographer. Common bile duct: Diameter: 3 mm, nondilated. Liver: Mildly nodular liver surface contour. Heterogeneous echotexture with mildly increased echogenicity and diminished through transmission. Portal vein is patent on color Doppler imaging with normal direction of blood flow towards the liver. Other: Multiple anechoic simple appearing right renal cysts noted  incidentally, largest measuring up to 1.5 cm in size. IMPRESSION: Mildly nodular liver surface contour with heterogeneous echotexture and mildly increased echogenicity. Features are suggestive of cirrhosis/intrinsic liver disease with possible fatty infiltration. Gallbladder appears contracted around a combination of echogenic stones and biliary sludge. Wall thickening possibly related to underdistention/contraction. The absence of a sonographic Eulah Pont sign is reassuring however if there is clinical concern for acute cholecystitis, consider HIDA for further evaluation. Electronically Signed   By: Kreg Shropshire M.D.   On: 11/12/2019 17:45    Time Spent in minutes  30   Susa Raring M.D on 11/13/2019 at 8:46 AM  To page go to [email or web address] - password [facility]

## 2019-11-14 ENCOUNTER — Inpatient Hospital Stay (HOSPITAL_COMMUNITY): Payer: Medicare HMO | Admitting: Anesthesiology

## 2019-11-14 ENCOUNTER — Encounter (HOSPITAL_COMMUNITY)
Admission: EM | Disposition: A | Discharge status: Home / Self Care | Payer: Self-pay | Source: Home / Self Care | Attending: Internal Medicine

## 2019-11-14 ENCOUNTER — Inpatient Hospital Stay (HOSPITAL_COMMUNITY): Payer: Medicare HMO

## 2019-11-14 ENCOUNTER — Encounter (HOSPITAL_COMMUNITY): Payer: Self-pay | Admitting: Internal Medicine

## 2019-11-14 HISTORY — PX: REMOVAL OF STONES: SHX5545

## 2019-11-14 HISTORY — PX: SPHINCTEROTOMY: SHX5544

## 2019-11-14 HISTORY — PX: ERCP: SHX5425

## 2019-11-14 LAB — COMPREHENSIVE METABOLIC PANEL
ALT: 131 U/L — ABNORMAL HIGH (ref 0–44)
AST: 38 U/L (ref 15–41)
Albumin: 2.8 g/dL — ABNORMAL LOW (ref 3.5–5.0)
Alkaline Phosphatase: 190 U/L — ABNORMAL HIGH (ref 38–126)
Anion gap: 10 (ref 5–15)
BUN: <5 mg/dL — ABNORMAL LOW (ref 8–23)
CO2: 26 mmol/L (ref 22–32)
Calcium: 9 mg/dL (ref 8.9–10.3)
Chloride: 108 mmol/L (ref 98–111)
Creatinine, Ser: 0.38 mg/dL — ABNORMAL LOW (ref 0.61–1.24)
GFR calc Af Amer: >60 mL/min (ref >60)
GFR calc non Af Amer: >60 mL/min (ref >60)
Glucose, Bld: 107 mg/dL — ABNORMAL HIGH (ref 70–99)
Potassium: 3.1 mmol/L — ABNORMAL LOW (ref 3.5–5.1)
Sodium: 144 mmol/L (ref 135–145)
Total Bilirubin: 0.5 mg/dL (ref 0.3–1.2)
Total Protein: 6.7 g/dL (ref 6.5–8.1)

## 2019-11-14 LAB — CBC WITH DIFFERENTIAL/PLATELET
Abs Immature Granulocytes: 0.01 10*3/uL (ref 0.00–0.07)
Basophils Absolute: 0.1 10*3/uL (ref 0.0–0.1)
Basophils Relative: 1 %
Eosinophils Absolute: 0.2 10*3/uL (ref 0.0–0.5)
Eosinophils Relative: 4 %
HCT: 41.9 % (ref 39.0–52.0)
Hemoglobin: 13.5 g/dL (ref 13.0–17.0)
Immature Granulocytes: 0 %
Lymphocytes Relative: 28 %
Lymphs Abs: 1.3 10*3/uL (ref 0.7–4.0)
MCH: 29.2 pg (ref 26.0–34.0)
MCHC: 32.2 g/dL (ref 30.0–36.0)
MCV: 90.5 fL (ref 80.0–100.0)
Monocytes Absolute: 0.7 10*3/uL (ref 0.1–1.0)
Monocytes Relative: 14 %
Neutro Abs: 2.5 10*3/uL (ref 1.7–7.7)
Neutrophils Relative %: 53 %
Platelets: 243 10*3/uL (ref 150–400)
RBC: 4.63 MIL/uL (ref 4.22–5.81)
RDW: 13.5 % (ref 11.5–15.5)
WBC: 4.7 10*3/uL (ref 4.0–10.5)
nRBC: 0 % (ref 0.0–0.2)

## 2019-11-14 LAB — BRAIN NATRIURETIC PEPTIDE: B Natriuretic Peptide: 48.5 pg/mL (ref 0.0–100.0)

## 2019-11-14 LAB — C-REACTIVE PROTEIN: CRP: 1.1 mg/dL — ABNORMAL HIGH (ref <1.0)

## 2019-11-14 LAB — MAGNESIUM: Magnesium: 1.9 mg/dL (ref 1.7–2.4)

## 2019-11-14 LAB — PROCALCITONIN: Procalcitonin: 5.21 ng/mL

## 2019-11-14 SURGERY — ERCP, WITH INTERVENTION IF INDICATED
Anesthesia: General

## 2019-11-14 MED ORDER — INDOMETHACIN 50 MG RE SUPP
RECTAL | Status: AC
  Filled 2019-11-14: qty 2

## 2019-11-14 MED ORDER — POTASSIUM CHLORIDE 2 MEQ/ML IV SOLN
INTRAVENOUS | Status: DC
  Filled 2019-11-14: qty 1000

## 2019-11-14 MED ORDER — SODIUM CHLORIDE 0.9 % IV SOLN: INTRAVENOUS | Status: DC

## 2019-11-14 MED ORDER — POTASSIUM CHLORIDE CRYS ER 20 MEQ PO TBCR: 40.0000 meq | EXTENDED_RELEASE_TABLET | Freq: Once | ORAL | Status: DC

## 2019-11-14 MED ORDER — EPHEDRINE SULFATE-NACL 50-0.9 MG/10ML-% IV SOSY
PREFILLED_SYRINGE | INTRAVENOUS | Status: DC | PRN
  Administered 2019-11-14: 5 mg via INTRAVENOUS
  Administered 2019-11-14: 10 mg via INTRAVENOUS
  Administered 2019-11-14: 15 mg via INTRAVENOUS

## 2019-11-14 MED ORDER — GLUCAGON HCL RDNA (DIAGNOSTIC) 1 MG IJ SOLR
INTRAMUSCULAR | Status: AC
  Filled 2019-11-14: qty 1

## 2019-11-14 MED ORDER — ONDANSETRON HCL 4 MG/2ML IJ SOLN
INTRAMUSCULAR | Status: DC | PRN
Start: 2019-11-14 — End: 2019-11-14
  Administered 2019-11-14: 4 mg via INTRAVENOUS

## 2019-11-14 MED ORDER — POTASSIUM CHLORIDE CRYS ER 20 MEQ PO TBCR
40.0000 meq | EXTENDED_RELEASE_TABLET | Freq: Once | ORAL | Status: AC
  Administered 2019-11-14: 40 meq via ORAL
  Filled 2019-11-14: qty 2

## 2019-11-14 MED ORDER — LIDOCAINE 2% (20 MG/ML) 5 ML SYRINGE
INTRAMUSCULAR | Status: DC | PRN
  Administered 2019-11-14: 60 mg via INTRAVENOUS

## 2019-11-14 MED ORDER — CEFAZOLIN SODIUM-DEXTROSE 2-4 GM/100ML-% IV SOLN
2.0000 g | INTRAVENOUS | Status: AC
  Administered 2019-11-15: 2 g via INTRAVENOUS
  Filled 2019-11-14: qty 100

## 2019-11-14 MED ORDER — ONDANSETRON HCL 4 MG/2ML IJ SOLN: 4.0000 mg | Freq: Once | INTRAMUSCULAR | Status: DC | PRN

## 2019-11-14 MED ORDER — INDOMETHACIN 50 MG RE SUPP
RECTAL | Status: DC | PRN
  Administered 2019-11-14: 100 mg via RECTAL

## 2019-11-14 MED ORDER — SODIUM CHLORIDE 0.9 % IV SOLN
3.0000 g | Freq: Once | INTRAVENOUS | Status: DC
  Filled 2019-11-14: qty 8

## 2019-11-14 MED ORDER — CIPROFLOXACIN IN D5W 400 MG/200ML IV SOLN
INTRAVENOUS | Status: AC
  Filled 2019-11-14: qty 200

## 2019-11-14 MED ORDER — KCL-LACTATED RINGERS-D5W 20 MEQ/L IV SOLN
INTRAVENOUS | Status: DC
  Filled 2019-11-14 (×2): qty 1000

## 2019-11-14 MED ORDER — CIPROFLOXACIN IN D5W 400 MG/200ML IV SOLN
INTRAVENOUS | Status: DC | PRN
Start: 2019-11-14 — End: 2019-11-14
  Administered 2019-11-14: 400 mg via INTRAVENOUS

## 2019-11-14 MED ORDER — PROPOFOL 10 MG/ML IV BOLUS
INTRAVENOUS | Status: DC | PRN
  Administered 2019-11-14: 180 mg via INTRAVENOUS

## 2019-11-14 MED ORDER — DEXAMETHASONE SODIUM PHOSPHATE 10 MG/ML IJ SOLN
INTRAMUSCULAR | Status: DC | PRN
  Administered 2019-11-14: 5 mg via INTRAVENOUS

## 2019-11-14 MED ORDER — FENTANYL CITRATE (PF) 100 MCG/2ML IJ SOLN
INTRAMUSCULAR | Status: DC | PRN
  Administered 2019-11-14: 100 ug via INTRAVENOUS

## 2019-11-14 MED ORDER — PHENYLEPHRINE HCL-NACL 10-0.9 MG/250ML-% IV SOLN
INTRAVENOUS | Status: DC | PRN
  Administered 2019-11-14: 75 ug/min via INTRAVENOUS

## 2019-11-14 MED ORDER — FENTANYL CITRATE (PF) 100 MCG/2ML IJ SOLN: 25.0000 ug | INTRAMUSCULAR | Status: DC | PRN

## 2019-11-14 MED ORDER — FENTANYL CITRATE (PF) 100 MCG/2ML IJ SOLN
INTRAMUSCULAR | Status: AC
  Filled 2019-11-14: qty 4

## 2019-11-14 MED ORDER — PHENYLEPHRINE 40 MCG/ML (10ML) SYRINGE FOR IV PUSH (FOR BLOOD PRESSURE SUPPORT)
PREFILLED_SYRINGE | INTRAVENOUS | Status: DC | PRN
  Administered 2019-11-14: 160 ug via INTRAVENOUS

## 2019-11-14 MED ORDER — SODIUM CHLORIDE 0.9 % IV SOLN
INTRAVENOUS | Status: DC | PRN
  Administered 2019-11-14: 40 mL

## 2019-11-14 MED ORDER — INDOMETHACIN 50 MG RE SUPP: 100.0000 mg | Freq: Once | RECTAL | Status: DC

## 2019-11-14 MED ORDER — HYDRALAZINE HCL 20 MG/ML IJ SOLN
INTRAMUSCULAR | Status: AC
  Filled 2019-11-14: qty 1

## 2019-11-14 MED ORDER — FENTANYL CITRATE (PF) 250 MCG/5ML IJ SOLN: INTRAMUSCULAR | Status: DC | PRN

## 2019-11-14 MED ORDER — LACTATED RINGERS IV SOLN: INTRAVENOUS | Status: DC | PRN

## 2019-11-14 NOTE — Op Note (Signed)
[facility] Patient Name: Thomas Raymond Procedure Date : 11/14/2019 MRN: [id number] Attending MD: Wilhemina Bonito. Marina Goodell , MD Date of Birth: Feb 06, 1946 CSN: [id number] Age: 74 Admit Type: Inpatient Procedure:                ERCP with sphincterotomy and common duct stone                            extraction Indications:              Bile duct stone(s), on imaging; elevated liver                            test; abdominal pain Providers:                Wilhemina Bonito. Marina Goodell, MD, Zoe Lan, RN, Marc Morgans,                            Technician Referring MD:             [facility] hospitalist Medicines:                General Anesthesia Complications:            No immediate complications. Estimated Blood Loss:     Estimated blood loss: none. Procedure:                Pre-Anesthesia Assessment:                           - Prior to the procedure, a History and Physical                            was performed, and patient medications and                            allergies were reviewed. The patient's tolerance of                            previous anesthesia was also reviewed. The risks                            and benefits of the procedure and the sedation                            options and risks were discussed with the patient.                            All questions were answered, and informed consent                            was obtained. Prior Anticoagulants: The patient has                            taken no previous anticoagulant or antiplatelet                            agents.  ASA Grade Assessment: II - A patient with                            mild systemic disease. After reviewing the risks                            and benefits, the patient was deemed in                            satisfactory condition to undergo the procedure.                           After obtaining informed consent, the scope was                            passed under direct vision. Throughout the                             procedure, the patient's blood pressure, pulse, and                            oxygen saturations were monitored continuously. The                            [id number]-Q180V ([id number] Olympus Duodensocope was                            introduced through the mouth, and used to inject                            contrast into and used to cannulate the bile duct.                            The ERCP was accomplished without difficulty. The                            patient tolerated the procedure well.                           ENDOSCOPIC Exam: The esophagus was not examined.                            The stomach was unremarkable as was the duodenum                            and post bulbar duodenum. The major ampulla was                            normal. The minor ampulla was not sought Scope In: Scope Out: Findings:      1. Unremarkable scout film      2. Initial injection of contrast resulted in simultaneous filling of the       distal bile duct and distal pancreatic duct, as there was a common  distal channel.      3. Partial injection of contrast into the pancreas revealed a normal       pancreatic duct distally      4. The common bile duct was then accessed with a hydrophilic guidewire.       Injection of contrast revealed several filling defects. The largest       measured 10 mm. These were located in the distal duct. The biliary tree       was dilated to about 10 mm. Though the guidewire could pass into the       cystic duct, there was no cystic duct or gallbladder filling      5. A moderate to large biliary sphincterotomy was made using a standard       sphincterotome and the ERBE system cutting in the 12:00 orientation.      6. The common bile duct stones were extracted with a sequential balloon       using 12 and 15 mm diameters. Post extraction occlusion cholangiogram       did not demonstrate any residual filling defects. Drainage was  excellent. Impression:               1. Choledocholithiasis status post ERCP with                            sphincterotomy and stone extraction                           2. Cholelithiasis. Recommendation:           1. Standard post ERCP observation. GI inpatient                            team will check on patient tomorrow                           2. Indomethacin suppositories will be given                           3. Trend liver test                           4. Consult general surgery regarding laparoscopic                            cholecystectomy. Procedure Code(s):        --- Professional ---                           [phone], Endoscopic retrograde                            cholangiopancreatography (ERCP); with removal of                            calculi/debris from biliary/pancreatic duct(s)                           43262, Endoscopic retrograde  cholangiopancreatography (ERCP); with                            sphincterotomy/papillotomy Diagnosis Code(s):        --- Professional ---                           K80.50, Calculus of bile duct without cholangitis                            or cholecystitis without obstruction CPT copyright 2019 American Medical Association. All rights reserved. The codes documented in this report are preliminary and upon coder review may  be revised to meet current compliance requirements. Wilhemina Bonito. Marina Goodell, MD 11/14/2019 12:08:18 PM This report has been signed electronically. Number of Addenda: 0

## 2019-11-14 NOTE — Interval H&P Note (Signed)
History and Physical Interval Note:  11/14/2019 10:47 AM  Thomas Raymond  has presented today for surgery, with the diagnosis of CBD stones.  The various methods of treatment have been discussed with the patient and family. After consideration of risks, benefits and other options for treatment, the patient has consented to  Procedure(s): ENDOSCOPIC RETROGRADE CHOLANGIOPANCREATOGRAPHY (ERCP) (N/A) as a surgical intervention.  The patient's history has been reviewed, patient examined, no change in status, stable for surgery.  I have reviewed the patient's chart and labs.  Questions were answered to the patient's satisfaction.     Yancey Flemings

## 2019-11-14 NOTE — Anesthesia Preprocedure Evaluation (Addendum)
Anesthesia Evaluation  Patient identified by MRN, date of birth, ID band Patient awake    Reviewed: Allergy & Precautions, NPO status , Patient's Chart, lab work & pertinent test results, reviewed documented beta blocker date and time   Airway Mallampati: II  TM Distance: >3 FB Neck ROM: Full    Dental  (+) Teeth Intact, Dental Advisory Given   Pulmonary neg pulmonary ROS,    Pulmonary exam normal breath sounds clear to auscultation       Cardiovascular hypertension, Pt. on medications and Pt. on home beta blockers Normal cardiovascular exam Rhythm:Regular Rate:Normal     Neuro/Psych ALS wheelchair-bound with weakness of both lower extremities  Neuromuscular disease    GI/Hepatic GERD  Medicated,CBD Stones    Endo/Other  negative endocrine ROS  Renal/GU negative Renal ROS     Musculoskeletal negative musculoskeletal ROS (+)   Abdominal   Peds  Hematology negative hematology ROS (+)   Anesthesia Other Findings Day of surgery medications reviewed with the patient.  Reproductive/Obstetrics                            Anesthesia Physical Anesthesia Plan  ASA: III  Anesthesia Plan: General   Post-op Pain Management:    Induction: Intravenous  PONV Risk Score and Plan: 2 and Dexamethasone and Ondansetron  Airway Management Planned: Oral ETT  Additional Equipment:   Intra-op Plan:   Post-operative Plan: Extubation in OR  Informed Consent: I have reviewed the patients History and Physical, chart, labs and discussed the procedure including the risks, benefits and alternatives for the proposed anesthesia with the patient or authorized representative who has indicated his/her understanding and acceptance.     Dental advisory given  Plan Discussed with: CRNA  Anesthesia Plan Comments:        Anesthesia Quick Evaluation

## 2019-11-14 NOTE — Transfer of Care (Signed)
Immediate Anesthesia Transfer of Care Note  Patient: Thomas Raymond  Procedure(s) Performed: ENDOSCOPIC RETROGRADE CHOLANGIOPANCREATOGRAPHY (ERCP) (N/A ) SPHINCTEROTOMY REMOVAL OF STONES  Patient Location: PACU and Endoscopy Unit  Anesthesia Type:General  Level of Consciousness: awake, alert  and patient cooperative  Airway & Oxygen Therapy: Patient Spontanous Breathing  Post-op Assessment: Report given to RN and Post -op Vital signs reviewed and stable  Post vital signs: Reviewed and stable  Last Vitals:  Vitals Value Taken Time  BP    Temp    Pulse    Resp    SpO2      Last Pain:  Vitals:   11/14/19 1008  TempSrc: (P) Temporal  PainSc:          Complications: No complications documented.

## 2019-11-14 NOTE — [place] Notes (Signed)
PROGRESS NOTE                                                                                                                                                                                                             Patient Demographics:    Thomas Raymond, is a 74 y.o. male, DOB - [birth year], NMM:[id number]  Admit date - 11/12/2019   Admitting Physician Eduard Clos, MD  Outpatient Primary MD for the patient is Corwin Levins, MD  LOS - 2  Chief Complaint  Patient presents with  . Abnormal Lab       Brief Narrative  - Thomas Raymond is a 74 y.o. male with history of ALS wheelchair-bound with weakness of both lower extremities presents to the ER after patient was found to have abnormal labs which were done as a routine work-up yesterday by patient's primary care physician.     Subjective:   Patient in bed, appears comfortable, denies any headache, no fever, no chest pain or pressure, no shortness of breath , no abdominal pain. No focal weakness.   Assessment  & Plan :    1.  Elevated LFTs with total bilirubin found incidentally on lab work.  Symptom-free, CT suspicious for choledocholithiasis, repeat labs show improvement in liver enzymes and a bilirubin level suggesting could have passed CBD stone.  GI following awaiting ERCP on December 12, 2019, question if he has passed a CBD stone.  2.  ALS.  Wheelchair and bedbound.  Supportive care.  3.  Hypertension.  Placed on beta-blocker + and PRN hydralazine.  4.  Hypokalemia.  Replaced.    Condition - Extremely Guarded  Family Communication  :  None  Code Status :  Full  Consults  :  GI  Procedures  :    CT - 1. Findings are consistent with choledocholithiasis with intrahepatic and extrahepatic biliary ductal dilatation. 2. Cholelithiasis without acute inflammation. 3. Cardiomegaly. Aortic Atherosclerosis (ICD10-I70.0).  PUD Prophylaxis : PPI  Disposition Plan  :    Status is: Inpatient  Remains inpatient  appropriate because:IV treatments appropriate due to intensity of illness or inability to take PO   Dispo: The patient is from: Home              Anticipated d/c is to: Home              Anticipated d/c date is: 3 days              Patient currently is not medically  stable to d/c.  DVT Prophylaxis  :  Heparin - SCDs   Lab Results  Component Value Date   PLT 243 11/14/2019    Diet :  Diet Order            Diet NPO time specified Except for: Sips with Meds  Diet effective midnight                  Inpatient Medications Scheduled Meds: . [MAR Hold] heparin injection (subcutaneous)  5,000 Units Subcutaneous Q8H  . [MAR Hold] metoprolol succinate  50 mg Oral Daily  . [MAR Hold] pantoprazole (PROTONIX) IV  40 mg Intravenous Q12H  . [MAR Hold] potassium chloride  40 mEq Oral Once   Continuous Infusions: . dextrose 5% lactated ringers with KCl 20 mEq/L 75 mL/hr at 11/14/19 0850   PRN Meds:.[MAR Hold] hydrALAZINE, [MAR Hold] metoprolol tartrate, [MAR Hold]  morphine injection, [DISCONTINUED] ondansetron **OR** [MAR Hold] ondansetron (ZOFRAN) IV  Antibiotics  :   Anti-infectives (From admission, onward)   None          Objective:   Vitals:   11/14/19 0007 11/14/19 0350 11/14/19 1007 11/14/19 1022  BP:   (!) 203/83 (!) 188/57  Pulse:   59 66  Resp:   18   Temp: 97.9 F (36.6 C) 97.8 F (36.6 C) 98.1 F (36.7 C)   TempSrc: Oral Oral Temporal   SpO2:   99%   Weight:   63.6 kg   Height:        SpO2: 99 %  Wt Readings from Last 3 Encounters:  11/14/19 63.6 kg  09/19/16 68.5 kg  05/30/16 70.3 kg     Intake/Output Summary (Last 24 hours) at 11/14/2019 1031 Last data filed at 11/14/2019 0410 Gross per 24 hour  Intake 3 ml  Output 425 ml  Net -422 ml     Physical Exam  Awake Alert, No new F.N deficits, Normal affect [place].AT,PERRAL Supple Neck,No JVD, No cervical lymphadenopathy appriciated.  Symmetrical Chest wall movement, Good air movement bilaterally,  CTAB RRR,No Gallops, Rubs or new Murmurs, No Parasternal Heave +ve B.Sounds, Abd Soft, No tenderness, No organomegaly appriciated, No rebound - guarding or rigidity. No Cyanosis, Clubbing or edema, No new Rash or bruise        Data Review:    Recent Labs  Lab 11/11/19 1358 11/12/19 0944 11/13/19 0829 11/14/19 0511  WBC 7.9 4.7 3.6* 4.7  HGB 14.6 13.9 13.4 13.5  HCT 45.1 44.4 42.0 41.9  PLT 233 226 226 243  MCV 90.0 92.9 89.9 90.5  MCH 29.1 29.1 28.7 29.2  MCHC 32.4 31.3 31.9 32.2  RDW 12.8 13.5 13.3 13.5  LYMPHSABS 932  --   --  1.3  MONOABS  --   --   --  0.7  EOSABS 24  --   --  0.2  BASOSABS 32  --   --  0.1    Recent Labs  Lab 11/11/19 1357 11/12/19 0944 11/13/19 0829 11/14/19 0511  NA 137 138 142 144  K 3.4* 3.1* 3.1* 3.1*  CL 99 101 106 108  CO2 25 28 26 26   GLUCOSE 92 138* 127* 107*  BUN 10 9 5* <5*  CREATININE 0.33* 0.45* <0.30* 0.38*  CALCIUM 9.3 9.1 8.7* 9.0  AST 280* 159* 67* 38  ALT 402* 277* 187* 131*  ALKPHOS  --  256* 226* 190*  BILITOT 3.1* 1.7* 0.9 0.5  ALBUMIN  --  3.2* 3.0* 2.8*  MG  --   --  1.9 1.9  CRP  --   --  3.1* 1.1*  PROCALCITON  --   --  9.14 5.21  INR  --   --  0.9  --   BNP  --   --  46.4 48.5    Recent Labs  Lab 11/12/19 2120 11/13/19 0829 11/14/19 0511  CRP  --  3.1* 1.1*  BNP  --  46.4 48.5  PROCALCITON  --  9.14 5.21  SARSCOV2NAA NEGATIVE  --   --     ------------------------------------------------------------------------------------------------------------------ No results for input(s): CHOL, HDL, LDLCALC, TRIG, CHOLHDL, LDLDIRECT in the last 72 hours.  Lab Results  Component Value Date   HGBA1C 5.8 08/27/2019   ------------------------------------------------------------------------------------------------------------------ No results for input(s): TSH, T4TOTAL, T3FREE, THYROIDAB in the last 72 hours.  Invalid input(s):  FREET3 ------------------------------------------------------------------------------------------------------------------ No results for input(s): VITAMINB12, FOLATE, FERRITIN, TIBC, IRON, RETICCTPCT in the last 72 hours.  Coagulation profile Recent Labs  Lab 11/13/19 0829  INR 0.9    No results for input(s): DDIMER in the last 72 hours.  Cardiac Enzymes No results for input(s): CKMB, TROPONINI, MYOGLOBIN in the last 168 hours.  Invalid input(s): CK ------------------------------------------------------------------------------------------------------------------    Component Value Date/Time   BNP 48.5 11/14/2019 0511    Micro Results Recent Results (from the past 240 hour(s))  Urine Culture     Status: None   Collection Time: 11/11/19  1:58 PM   Specimen: Blood  Result Value Ref Range Status   Source: NOT GIVEN  Final   Status: FINAL  Final   Isolate 1:   Final    Growth of mixed flora was isolated, suggesting probable contamination. No further testing will be performed. If clinically indicated, recollection using a method to minimize contamination, with prompt transfer to Urine Culture Transport Tube, is  recommended.   SARS Coronavirus 2 by RT PCR (hospital order, performed in [facility] hospital lab) Nasopharyngeal Nasopharyngeal Swab     Status: None   Collection Time: 11/12/19  9:20 PM   Specimen: Nasopharyngeal Swab  Result Value Ref Range Status   SARS Coronavirus 2 NEGATIVE NEGATIVE Final    Comment: (NOTE) SARS-CoV-2 target nucleic acids are NOT DETECTED.  The SARS-CoV-2 RNA is generally detectable in upper and lower respiratory specimens during the acute phase of infection. The lowest concentration of SARS-CoV-2 viral copies this assay can detect is 250 copies / mL. A negative result does not preclude SARS-CoV-2 infection and should not be used as the sole basis for treatment or other patient management decisions.  A negative result may occur with improper  specimen collection / handling, submission of specimen other than nasopharyngeal swab, presence of viral mutation(s) within the areas targeted by this assay, and inadequate number of viral copies (<250 copies / mL). A negative result must be combined with clinical observations, patient history, and epidemiological information.  Fact Sheet for Patients:   BoilerBrush.com.cy  Fact Sheet for Healthcare Providers: https://pope.com/  This test is not yet approved or  cleared by the [place] FDA and has been authorized for detection and/or diagnosis of SARS-CoV-2 by FDA under an Emergency Use Authorization (EUA).  This EUA will remain in effect (meaning this test can be used) for the duration of the COVID-19 declaration under Section 564(b)(1) of the Act, 21 U.S.C. section 360bbb-3(b)(1), unless the authorization is terminated or revoked sooner.  Performed at [facility], [address] [place], [place] [address]   MRSA PCR Screening     Status: None  Collection Time: 11/13/19  2:26 PM   Specimen: Nasal Mucosa; Nasopharyngeal  Result Value Ref Range Status   MRSA by PCR NEGATIVE NEGATIVE Final    Comment:        The GeneXpert MRSA Assay (FDA approved for NASAL specimens only), is one component of a comprehensive MRSA colonization surveillance program. It is not intended to diagnose MRSA infection nor to guide or monitor treatment for MRSA infections. Performed at [facility], [address] [place], [place] [address]     Radiology Reports CT ABDOMEN PELVIS W CONTRAST  Result Date: 11/12/2019 CLINICAL DATA:  Diverticulitis. Elevated liver enzymes with left lower quadrant pain. EXAM: CT ABDOMEN AND PELVIS WITH CONTRAST TECHNIQUE: Multidetector CT imaging of the abdomen and pelvis was performed using the standard protocol following bolus administration of intravenous contrast. CONTRAST:  OMNIPAQUE IOHEXOL  300 MG/ML  SOLN COMPARISON:  Ultrasound from the same day. CT dated October 16, 2015. CT dated November 21, 2015. FINDINGS: Lower chest: The lung bases are clear. The heart is enlarged. Hepatobiliary: The liver is normal. Cholelithiasis without acute inflammation.There is intrahepatic and extrahepatic biliary ductal dilatation. The common bile duct measures up to approximately 0.9 cm. Multiple filling defects are noted within the distal common bile duct (coronal series 6, image 57). Pancreas: Normal contours without ductal dilatation. No peripancreatic fluid collection. Spleen: Unremarkable. Adrenals/Urinary Tract: --Adrenal glands: Unremarkable. --Right kidney/ureter: No hydronephrosis or radiopaque kidney stones. --Left kidney/ureter: No hydronephrosis or radiopaque kidney stones. --Urinary bladder: Unremarkable. Stomach/Bowel: --Stomach/Duodenum: No hiatal hernia or other gastric abnormality. Normal duodenal course and caliber. --Small bowel: Unremarkable. --Colon: Unremarkable. --Appendix: Normal. Vascular/Lymphatic: Atherosclerotic calcification is present within the non-aneurysmal abdominal aorta, without hemodynamically significant stenosis. --No retroperitoneal lymphadenopathy. --No mesenteric lymphadenopathy. --No pelvic or inguinal lymphadenopathy. Reproductive: Unremarkable Other: No ascites or free air. The abdominal wall is normal. Musculoskeletal. The patient is status post prior left-sided intramedullary nail placement through the left femur. IMPRESSION: 1. Findings are consistent with choledocholithiasis with intrahepatic and extrahepatic biliary ductal dilatation. 2. Cholelithiasis without acute inflammation. 3. Cardiomegaly. Aortic Atherosclerosis (ICD10-I70.0). Electronically Signed   By: Katherine Mantle M.D.   On: 11/12/2019 19:37   DG Abd Acute W/Chest  Result Date: 11/12/2019 CLINICAL DATA:  Abdominal pain. EXAM: DG ABDOMEN ACUTE W/ 1V CHEST COMPARISON:  11/21/2015 FINDINGS: There is elevation  of the right hemidiaphragm. The heart size is normal. Aortic calcifications are noted. There is no significant pleural effusion or focal infiltrate. The bowel gas pattern is nonobstructive. There are no radiopaque kidney stones. IMPRESSION: Negative abdominal radiographs.  No acute cardiopulmonary disease. Electronically Signed   By: Katherine Mantle M.D.   On: 11/12/2019 19:38   US Abdomen Limited RUQ  Result Date: 11/12/2019 CLINICAL DATA:  Elevated LFTs, recurrent acalculous cholecystitis. EXAM: ULTRASOUND ABDOMEN LIMITED RIGHT UPPER QUADRANT COMPARISON:  Ultrasound 10/17/2015, CT 12/11/2015 the yeah sounds go thanks neck as the FINDINGS: Gallbladder: Gallbladder is poorly visualized largely contracted around echogenic stones and biliary sludge, largest calculus measuring up to 3 mm in maximal dimension with posterior shadowing. Wall is borderline thickened at 4 mm though possibility of to the underdistention. Sonographic Murphy sign reportedly negative by sonographer. Common bile duct: Diameter: 3 mm, nondilated. Liver: Mildly nodular liver surface contour. Heterogeneous echotexture with mildly increased echogenicity and diminished through transmission. Portal vein is patent on color Doppler imaging with normal direction of blood flow towards the liver. Other: Multiple anechoic simple appearing right renal cysts noted incidentally, largest measuring up to 1.5 cm  in size. IMPRESSION: Mildly nodular liver surface contour with heterogeneous echotexture and mildly increased echogenicity. Features are suggestive of cirrhosis/intrinsic liver disease with possible fatty infiltration. Gallbladder appears contracted around a combination of echogenic stones and biliary sludge. Wall thickening possibly related to underdistention/contraction. The absence of a sonographic Eulah Pont sign is reassuring however if there is clinical concern for acute cholecystitis, consider HIDA for further evaluation. Electronically Signed    By: Kreg Shropshire M.D.   On: 11/12/2019 17:45    Time Spent in minutes  30   Susa Raring M.D on 11/14/2019 at 10:31 AM  To page go to [email or web address] - password [email or web address]

## 2019-11-14 NOTE — Consult Note (Addendum)
Surgical Evaluation Requesting provider: Dr. Thedore Mins  Chief Complaint: choledocholithiasis   HPI: [name] phone interpreter utilized.  74 year old gentleman who is wheelchair-bound due to ALS who was admitted to the hospital 2 days ago after finding of abnormal LFT on routine blood work with his PCP.  He has undergone an ultrasound which shows a contracted gallbladder with wall thickening and cholelithiasis, nodular liver surface suggestive of cirrhosis or intrinsic liver disease with possible fatty infiltration (his INR, platelets, creatinine, and now bilirubin are all normal); subsequent CT scan suggest choledocholithiasis and notes cardiomegaly.  He is now status post ERCP earlier today with extraction of multiple stones and sphincterotomy.  His LFTs had already nearly normalized this morning.  General surgery is consulted regarding cholecystectomy. Currently he notes some mild epigastric discomfort.  Of note, the patient was hospitalized in 2017 for what was thought to be acalculus cholecystitis and was treated with antibiotics and a percutaneous cholecystostomy tube. Abdominal surgical history notable for previous PEG  No Known Allergies  Past Medical History:  Diagnosis Date  . ALLERGIC RHINITIS   . ALS   . HYPERLIPIDEMIA   . HYPERTENSION   . Impaired glucose tolerance 02/17/2011  . LOW BACK PAIN   . VERTIGO     Past Surgical History:  Procedure Laterality Date  . COMPRESSION HIP SCREW Left 02/07/2014   Procedure: IM Nail;  Surgeon: Harvie Junior, MD;  Location: [facility];  Service: Orthopedics;  Laterality: Left;  . IR GENERIC HISTORICAL  11/21/2015   IR RADIOLOGIST EVAL & MGMT 11/21/2015 Oley Balm, MD [facility] INTERV RAD  . Lipoma removal  2003  . Percutaneous gastrostomy tube repalcement     04/1999 and removed 07/1999  . TRACHEOSTOMY      Family History  Problem Relation Age of Onset  . Other Mother        Deceased  . Other Father        Deceased  . Other Son 2       x 2  starvation/illness from [place]  . Other Daughter 2       x 2 starvation/illness from refugee camp    Social History   Socioeconomic History  . Marital status: Widowed    Spouse name: Not on file  . Number of children: 4 D  . Years of education: Not on file  . Highest education level: Not on file  Occupational History  . Occupation: disabled  Tobacco Use  . Smoking status: Never Smoker  . Smokeless tobacco: Never Used  Substance and Sexual Activity  . Alcohol use: No  . Drug use: No  . Sexual activity: Never  Other Topics Concern  . Not on file  Social History Narrative   He lives with wife.   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  He went on disability in 2000 after diagnosis of ALS.   He moved from [place] in 1985.   Social Determinants of Health   Financial Resource Strain:   . Difficulty of Paying Living Expenses:   Food Insecurity:   . Worried About Programme researcher, broadcasting/film/video in the Last Year:   . Barista in the Last Year:   Transportation Needs:   . Freight forwarder (Medical):   [name] Lack of Transportation (Non-Medical):   Physical Activity:   . Days of Exercise per Week:   . Minutes of Exercise per Session:   Stress:   . Feeling of Stress :  Social Connections:   . Frequency of Communication with Friends and Family:   . Frequency of Social Gatherings with Friends and Family:   . Attends Religious Services:   . Active Member of Clubs or Organizations:   . Attends Banker Meetings:   [name] Marital Status:     No current facility-administered medications on file prior to encounter.   Current Outpatient Medications on File Prior to Encounter  Medication Sig Dispense Refill  . amLODipine (NORVASC) 10 MG tablet Take 1 tablet (10 mg total) by mouth daily. 90 tablet 3  . cetirizine (ZYRTEC) 10 MG tablet TAKE 1 TABLET BY MOUTH EVERY DAY (Patient taking differently: Take 10 mg by mouth daily. ) 90 tablet 0  .  metoprolol succinate (TOPROL-XL) 50 MG 24 hr tablet Take 1 tablet (50 mg total) by mouth daily. 90 tablet 3  . omeprazole (PRILOSEC) 20 MG capsule Take 1 capsule (20 mg total) by mouth daily. 90 capsule 3  . rosuvastatin (CRESTOR) 20 MG tablet TAKE 1 TABLET BY MOUTH EVERY DAY (Patient taking differently: Take 20 mg by mouth daily. ) 90 tablet 3  . acetaminophen (TYLENOL) 325 MG tablet Take 2 tablets (650 mg total) by mouth every 6 (six) hours as needed for mild pain (or temp > 100). (Patient not taking: Reported on 11/12/2019)    . albuterol (PROVENTIL HFA;VENTOLIN HFA) 108 (90 Base) MCG/ACT inhaler Inhale 2 puffs into the lungs every 6 (six) hours as needed for wheezing or shortness of breath. (Patient not taking: Reported on 11/12/2019) 1 Inhaler 2  . meclizine (ANTIVERT) 12.5 MG tablet TAKE 1 TABLET BY MOUTH 3 TIMES A DAY AS NEEDED FOR DIZZINESS (Patient not taking: Reported on 11/12/2019) 60 tablet 2    Review of Systems: a complete, 10pt review of systems was completed with pertinent positives and negatives as documented in the HPI  Physical Exam: Vitals:   11/14/19 1235 11/14/19 1252  BP: (!) 130/65 (!) 141/69  Pulse: 52   Resp: (!) 24   Temp:    SpO2: 99%    Gen: A&Ox3, no distress  Eyes: lids and conjunctivae normal, no icterus. Pupils equally round and reactive to light.  Neck: supple without mass or thyromegaly Chest: respiratory effort is normal. No crepitus or tenderness on palpation of the chest. Breath sounds equal.  Cardiovascular: RRR with palpable distal pulses, no pedal edema Gastrointestinal: soft, nondistended, minimal epigastric tenderness. Old PEG site in LUQ. No mass, hepatomegaly or splenomegaly. Tiny umbilical hernia.  Lymphatic: no lymphadenopathy in the neck or groin Muscoloskeletal: no clubbing or cyanosis of the fingers.  Diffuse weakness. Neuro: cranial nerves grossly intact.  Psych: appropriate mood and affect, normal insight/judgment intact  Skin: warm and  dry   CBC Latest Ref Rng & Units 11/14/2019 11/13/2019 11/12/2019  WBC 4.0 - 10.5 K/uL 4.7 3.6(L) 4.7  Hemoglobin 13.0 - 17.0 g/dL 06.3 01.6 01.0  Hematocrit 39 - 52 % 41.9 42.0 44.4  Platelets 150 - 400 K/uL 243 226 226    CMP Latest Ref Rng & Units 11/14/2019 11/13/2019 11/12/2019  Glucose 70 - 99 mg/dL 932(T) 557(D) 220(U)  BUN 8 - 23 mg/dL <5(K) 5(L) 9  Creatinine 0.61 - 1.24 mg/dL 2.70(W) <2.37(S) 2.83(T)  Sodium 135 - 145 mmol/L 144 142 138  Potassium 3.5 - 5.1 mmol/L 3.1(L) 3.1(L) 3.1(L)  Chloride 98 - 111 mmol/L 108 106 101  CO2 22 - 32 mmol/L 26 26 28   Calcium 8.9 - 10.3 mg/dL 9.0 ) 9.1  Total Protein 6.5 - 8.1 g/dL 6.7 6.8 7.7  Total Bilirubin 0.3 - 1.2 mg/dL 0.5 0.9 7.0(W)  Alkaline Phos 38 - 126 U/L 190(H) 226(H) 256(H)  AST 15 - 41 U/L 38 67(H) 159(H)  ALT 0 - 44 U/L 131(H) 187(H) 277(H)    Lab Results  Component Value Date   INR 0.9 11/13/2019   INR 1.19 10/17/2015   INR 0.87 02/07/2014    Imaging: CT ABDOMEN PELVIS W CONTRAST  Result Date: 11/12/2019 CLINICAL DATA:  Diverticulitis. Elevated liver enzymes with left lower quadrant pain. EXAM: CT ABDOMEN AND PELVIS WITH CONTRAST TECHNIQUE: Multidetector CT imaging of the abdomen and pelvis was performed using the standard protocol following bolus administration of intravenous contrast. CONTRAST:  OMNIPAQUE IOHEXOL 300 MG/ML  SOLN COMPARISON:  Ultrasound from the same day. CT dated October 16, 2015. CT dated November 21, 2015. FINDINGS: Lower chest: The lung bases are clear. The heart is enlarged. Hepatobiliary: The liver is normal. Cholelithiasis without acute inflammation.There is intrahepatic and extrahepatic biliary ductal dilatation. The common bile duct measures up to approximately 0.9 cm. Multiple filling defects are noted within the distal common bile duct (coronal series 6, image 57). Pancreas: Normal contours without ductal dilatation. No peripancreatic fluid collection. Spleen: Unremarkable. Adrenals/Urinary  Tract: --Adrenal glands: Unremarkable. --Right kidney/ureter: No hydronephrosis or radiopaque kidney stones. --Left kidney/ureter: No hydronephrosis or radiopaque kidney stones. --Urinary bladder: Unremarkable. Stomach/Bowel: --Stomach/Duodenum: No hiatal hernia or other gastric abnormality. Normal duodenal course and caliber. --Small bowel: Unremarkable. --Colon: Unremarkable. --Appendix: Normal. Vascular/Lymphatic: Atherosclerotic calcification is present within the non-aneurysmal abdominal aorta, without hemodynamically significant stenosis. --No retroperitoneal lymphadenopathy. --No mesenteric lymphadenopathy. --No pelvic or inguinal lymphadenopathy. Reproductive: Unremarkable Other: No ascites or free air. The abdominal wall is normal. Musculoskeletal. The patient is status post prior left-sided intramedullary nail placement through the left femur. IMPRESSION: 1. Findings are consistent with choledocholithiasis with intrahepatic and extrahepatic biliary ductal dilatation. 2. Cholelithiasis without acute inflammation. 3. Cardiomegaly. Aortic Atherosclerosis (ICD10-I70.0). Electronically Signed   By: Katherine Mantle M.D.   On: 11/12/2019 19:37   DG ERCP BILIARY & PANCREATIC DUCTS  Result Date: 11/14/2019 CLINICAL DATA:  Elevated LFTs, choledocholithiasis on CT EXAM: ERCP TECHNIQUE: Multiple spot images obtained with the fluoroscopic device and submitted for interpretation post-procedure. COMPARISON:  11/12/2019 FINDINGS: Series of fluoroscopic spot images document endoscopic cannulation and opacification of the CBD. Filling defects are noted in the distal CBD on initial image. There is mild dilatation of the intrahepatic biliary tree, incompletely visualized peripherally. Subsequent images document passage of balloon catheter through the CBD. No extravasation. IMPRESSION: Endoscopic CBD cannulation and intervention These images were submitted for radiologic interpretation only. Please see the procedural  report for the amount of contrast and the fluoroscopy time utilized. Electronically Signed   By: Corlis Leak M.D.   On: 11/14/2019 13:46   US Abdomen Limited RUQ  Result Date: 11/12/2019 CLINICAL DATA:  Elevated LFTs, recurrent acalculous cholecystitis. EXAM: ULTRASOUND ABDOMEN LIMITED RIGHT UPPER QUADRANT COMPARISON:  Ultrasound 10/17/2015, CT 12/11/2015 the yeah sounds go thanks neck as the FINDINGS: Gallbladder: Gallbladder is poorly visualized largely contracted around echogenic stones and biliary sludge, largest calculus measuring up to 3 mm in maximal dimension with posterior shadowing. Wall is borderline thickened at 4 mm though possibility of to the underdistention. Sonographic Murphy sign reportedly negative by sonographer. Common bile duct: Diameter: 3 mm, nondilated. Liver: Mildly nodular liver surface contour. Heterogeneous echotexture with mildly increased echogenicity and diminished through transmission. Portal vein is patent on color  Doppler imaging with normal direction of blood flow towards the liver. Other: Multiple anechoic simple appearing right renal cysts noted incidentally, largest measuring up to 1.5 cm in size. IMPRESSION: Mildly nodular liver surface contour with heterogeneous echotexture and mildly increased echogenicity. Features are suggestive of cirrhosis/intrinsic liver disease with possible fatty infiltration. Gallbladder appears contracted around a combination of echogenic stones and biliary sludge. Wall thickening possibly related to underdistention/contraction. The absence of a sonographic Eulah PontMurphy sign is reassuring however if there is clinical concern for acute cholecystitis, consider HIDA for further evaluation. Electronically Signed   By: Kreg ShropshirePrice  DeHay M.D.   On: 11/12/2019 17:45     A/P: Choledocholithiasis s/p ERCP today. I went over the relevant anatomy and pathology with the patiet. Discussed that in general, the recommendation is proceeding with laparoscopic  cholecystectomy with possible cholangiogram during the same hospitalization. Discussed risks of surgery including bleeding, pain, scarring, intraabdominal injury specifically to the common bile duct and sequelae, bile leak, conversion to open surgery, blood clot, pneumonia, heart attack, stroke, failure to resolve symptoms, etc. His risk of complications including prolonged ventilator dependence is increased due to his ALS. The alternative of non-operative therapy carries a high risk of recurrent choledocholithiasis or gallstone pancreatitis. This is the third gallbladder- related admission (previously medically treated acalculous cholecystitis in March 2017, then PCT placement in June 2017). I think it is reasonable to proceed with cholecystectomy this admission and the patient wishes to do so. Barring development of post-ERCP pancreatitis or other issue will tentatively schedule for Dr. Magnus IvanBlackman tomorrow and will consult anesthesia team.  Questions welcomed and answered.     Patient Active Problem List   Diagnosis Date Noted  . Choledocholithiasis 11/12/2019  . Elevated liver enzymes   . Abdominal pain, left lateral 11/11/2019  . Conjunctivitis 11/13/2017  . Acute sinus infection 05/18/2017  . Acute upper respiratory infection 09/19/2016  . Acute hypoxemic respiratory failure (HCC)   . Atelectasis of both lungs   . Acute emphysematous cholecystitis   . Hypokalemia   . Hypomagnesemia   . Dysphagia, pharyngoesophageal phase 02/07/2014  . 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  . Colon cancer screening 01/18/2014  . Need for prophylactic vaccination and inoculation against influenza 02/07/2012  . Need for prophylactic vaccination against Streptococcus pneumoniae (pneumococcus) 02/07/2012  . Respiratory tract infection 04/24/2011  . 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  . Impaired glucose tolerance 02/17/2011  . Preventative health care 02/17/2011  . BPPV (benign paroxysmal positional vertigo)  11/05/2010  . VERTIGO 03/05/2010  . ALS 01/26/2009  . FATIGUE 03/30/2008  . ALLERGIC CONJUNCTIVITIS 12/29/2007  . THRUSH 09/29/2007  . Hyperlipidemia 03/30/2007  . METHICILLIN RESISTANT STAPHYLOCOCCUS AUREUS INFECTION 11/22/2006  . DISORDER, MYONEURAL NEC 11/22/2006  . Essential hypertension 11/22/2006  . ALLERGIC RHINITIS 11/22/2006  . LOW BACK PAIN 11/22/2006  . Other dysphagia 11/22/2006       Phylliss Blakeshelsea Mariafernanda Hendricksen, MD [facility]  See AMION to contact appropriate on-call provider

## 2019-11-14 NOTE — Anesthesia Postprocedure Evaluation (Signed)
Anesthesia Post Note  Patient: Thomas Raymond  Procedure(s) Performed: ENDOSCOPIC RETROGRADE CHOLANGIOPANCREATOGRAPHY (ERCP) (N/A ) SPHINCTEROTOMY REMOVAL OF STONES     Patient location during evaluation: PACU Anesthesia Type: General Level of consciousness: awake and alert Pain management: pain level controlled Vital Signs Assessment: post-procedure vital signs reviewed and stable Respiratory status: spontaneous breathing, nonlabored ventilation, respiratory function stable and patient connected to nasal cannula oxygen Cardiovascular status: blood pressure returned to baseline and stable Postop Assessment: no apparent nausea or vomiting Anesthetic complications: no   No complications documented.  Last Vitals:  Vitals:   11/14/19 1225 11/14/19 1235  BP: 128/65 (!) 130/65  Pulse: 54 52  Resp: 21 (!) 24  Temp:    SpO2: 98% 99%    Last Pain:  Vitals:   11/14/19 1235  TempSrc:   PainSc: 0-No pain                 Cecile Hearing

## 2019-11-15 ENCOUNTER — Inpatient Hospital Stay (HOSPITAL_COMMUNITY): Payer: Medicare HMO | Admitting: Anesthesiology

## 2019-11-15 ENCOUNTER — Encounter (HOSPITAL_COMMUNITY): Payer: Self-pay | Admitting: Internal Medicine

## 2019-11-15 ENCOUNTER — Encounter (HOSPITAL_COMMUNITY)
Admission: EM | Disposition: A | Discharge status: Home / Self Care | Payer: Self-pay | Source: Home / Self Care | Attending: Internal Medicine

## 2019-11-15 DIAGNOSIS — K8050 Calculus of bile duct without cholangitis or cholecystitis without obstruction: Secondary | ICD-10-CM

## 2019-11-15 DIAGNOSIS — R748 Abnormal levels of other serum enzymes: Secondary | ICD-10-CM

## 2019-11-15 HISTORY — PX: CHOLECYSTECTOMY: SHX55

## 2019-11-15 LAB — CBC WITH DIFFERENTIAL/PLATELET
Abs Immature Granulocytes: 0.02 10*3/uL (ref 0.00–0.07)
Basophils Absolute: 0 10*3/uL (ref 0.0–0.1)
Basophils Relative: 0 %
Eosinophils Absolute: 0 10*3/uL (ref 0.0–0.5)
Eosinophils Relative: 0 %
HCT: 37.2 % — ABNORMAL LOW (ref 39.0–52.0)
Hemoglobin: 12 g/dL — ABNORMAL LOW (ref 13.0–17.0)
Immature Granulocytes: 0 %
Lymphocytes Relative: 22 %
Lymphs Abs: 1 10*3/uL (ref 0.7–4.0)
MCH: 29 pg (ref 26.0–34.0)
MCHC: 32.3 g/dL (ref 30.0–36.0)
MCV: 89.9 fL (ref 80.0–100.0)
Monocytes Absolute: 0.6 10*3/uL (ref 0.1–1.0)
Monocytes Relative: 12 %
Neutro Abs: 3.1 10*3/uL (ref 1.7–7.7)
Neutrophils Relative %: 66 %
Platelets: 228 10*3/uL (ref 150–400)
RBC: 4.14 MIL/uL — ABNORMAL LOW (ref 4.22–5.81)
RDW: 13.6 % (ref 11.5–15.5)
WBC: 4.7 10*3/uL (ref 4.0–10.5)
nRBC: 0 % (ref 0.0–0.2)

## 2019-11-15 LAB — COMPREHENSIVE METABOLIC PANEL
ALT: 278 U/L — ABNORMAL HIGH (ref 0–44)
AST: 445 U/L — ABNORMAL HIGH (ref 15–41)
Albumin: 2.6 g/dL — ABNORMAL LOW (ref 3.5–5.0)
Alkaline Phosphatase: 269 U/L — ABNORMAL HIGH (ref 38–126)
Anion gap: 6 (ref 5–15)
BUN: 5 mg/dL — ABNORMAL LOW (ref 8–23)
CO2: 27 mmol/L (ref 22–32)
Calcium: 9.1 mg/dL (ref 8.9–10.3)
Chloride: 110 mmol/L (ref 98–111)
Creatinine, Ser: 0.34 mg/dL — ABNORMAL LOW (ref 0.61–1.24)
GFR calc Af Amer: >60 mL/min (ref >60)
GFR calc non Af Amer: >60 mL/min (ref >60)
Glucose, Bld: 135 mg/dL — ABNORMAL HIGH (ref 70–99)
Potassium: 4.7 mmol/L (ref 3.5–5.1)
Sodium: 143 mmol/L (ref 135–145)
Total Bilirubin: 0.8 mg/dL (ref 0.3–1.2)
Total Protein: 6 g/dL — ABNORMAL LOW (ref 6.5–8.1)

## 2019-11-15 LAB — PROCALCITONIN: Procalcitonin: 1.35 ng/mL

## 2019-11-15 LAB — C-REACTIVE PROTEIN: CRP: 0.7 mg/dL (ref <1.0)

## 2019-11-15 LAB — BRAIN NATRIURETIC PEPTIDE: B Natriuretic Peptide: 43.3 pg/mL (ref 0.0–100.0)

## 2019-11-15 LAB — MAGNESIUM: Magnesium: 1.9 mg/dL (ref 1.7–2.4)

## 2019-11-15 SURGERY — LAPAROSCOPIC CHOLECYSTECTOMY
Anesthesia: General | Site: Abdomen

## 2019-11-15 MED ORDER — ACETAMINOPHEN 500 MG PO TABS: 1000.0000 mg | ORAL_TABLET | Freq: Once | ORAL | Status: DC | PRN

## 2019-11-15 MED ORDER — CHLORHEXIDINE GLUCONATE 0.12 % MT SOLN: 15.0000 mL | Freq: Once | OROMUCOSAL | Status: DC

## 2019-11-15 MED ORDER — HEMOSTATIC AGENTS (NO CHARGE) OPTIME
TOPICAL | Status: DC | PRN
  Administered 2019-11-15: 1 via TOPICAL

## 2019-11-15 MED ORDER — 0.9 % SODIUM CHLORIDE (POUR BTL) OPTIME
TOPICAL | Status: DC | PRN
  Administered 2019-11-15: 1000 mL

## 2019-11-15 MED ORDER — DEXAMETHASONE SODIUM PHOSPHATE 10 MG/ML IJ SOLN
INTRAMUSCULAR | Status: AC
  Filled 2019-11-15: qty 2

## 2019-11-15 MED ORDER — ROCURONIUM BROMIDE 10 MG/ML (PF) SYRINGE
PREFILLED_SYRINGE | INTRAVENOUS | Status: AC
  Filled 2019-11-15: qty 20

## 2019-11-15 MED ORDER — OXYCODONE HCL 5 MG PO TABS: 5.0000 mg | ORAL_TABLET | Freq: Once | ORAL | Status: DC | PRN

## 2019-11-15 MED ORDER — PHENYLEPHRINE 40 MCG/ML (10ML) SYRINGE FOR IV PUSH (FOR BLOOD PRESSURE SUPPORT)
PREFILLED_SYRINGE | INTRAVENOUS | Status: AC
  Filled 2019-11-15: qty 10

## 2019-11-15 MED ORDER — ONDANSETRON HCL 4 MG/2ML IJ SOLN
INTRAMUSCULAR | Status: DC | PRN
  Administered 2019-11-15: 4 mg via INTRAVENOUS

## 2019-11-15 MED ORDER — FENTANYL CITRATE (PF) 100 MCG/2ML IJ SOLN
INTRAMUSCULAR | Status: DC | PRN
  Administered 2019-11-15: 50 ug via INTRAVENOUS
  Administered 2019-11-15: 100 ug via INTRAVENOUS
  Administered 2019-11-15 (×2): 50 ug via INTRAVENOUS

## 2019-11-15 MED ORDER — FENTANYL CITRATE (PF) 250 MCG/5ML IJ SOLN
INTRAMUSCULAR | Status: AC
  Filled 2019-11-15: qty 5

## 2019-11-15 MED ORDER — PROPOFOL 10 MG/ML IV BOLUS
INTRAVENOUS | Status: DC | PRN
  Administered 2019-11-15: 10 mg via INTRAVENOUS
  Administered 2019-11-15: 60 mg via INTRAVENOUS

## 2019-11-15 MED ORDER — PROPOFOL 10 MG/ML IV BOLUS
INTRAVENOUS | Status: AC
  Filled 2019-11-15: qty 20

## 2019-11-15 MED ORDER — LACTATED RINGERS IV SOLN: INTRAVENOUS | Status: DC

## 2019-11-15 MED ORDER — ONDANSETRON HCL 4 MG/2ML IJ SOLN
INTRAMUSCULAR | Status: AC
  Filled 2019-11-15: qty 4

## 2019-11-15 MED ORDER — DEXAMETHASONE SODIUM PHOSPHATE 10 MG/ML IJ SOLN
INTRAMUSCULAR | Status: DC | PRN
Start: 2019-11-15 — End: 2019-11-15
  Administered 2019-11-15: 10 mg via INTRAVENOUS

## 2019-11-15 MED ORDER — SODIUM CHLORIDE 0.9 % IR SOLN
Status: DC | PRN
  Administered 2019-11-15: 1

## 2019-11-15 MED ORDER — BUPIVACAINE HCL (PF) 0.25 % IJ SOLN
INTRAMUSCULAR | Status: AC
  Filled 2019-11-15: qty 30

## 2019-11-15 MED ORDER — EPHEDRINE 5 MG/ML INJ
INTRAVENOUS | Status: AC
  Filled 2019-11-15: qty 20

## 2019-11-15 MED ORDER — SUGAMMADEX SODIUM 200 MG/2ML IV SOLN
INTRAVENOUS | Status: DC | PRN
Start: 2019-11-15 — End: 2019-11-15
  Administered 2019-11-15: 400 mg via INTRAVENOUS

## 2019-11-15 MED ORDER — ACETAMINOPHEN 160 MG/5ML PO SOLN: 1000.0000 mg | Freq: Once | ORAL | Status: DC | PRN

## 2019-11-15 MED ORDER — LIDOCAINE 2% (20 MG/ML) 5 ML SYRINGE
INTRAMUSCULAR | Status: AC
  Filled 2019-11-15: qty 10

## 2019-11-15 MED ORDER — CHLORHEXIDINE GLUCONATE 0.12 % MT SOLN
OROMUCOSAL | Status: AC
  Filled 2019-11-15: qty 15

## 2019-11-15 MED ORDER — BUPIVACAINE HCL 0.25 % IJ SOLN
INTRAMUSCULAR | Status: DC | PRN
  Administered 2019-11-15: 20 mL

## 2019-11-15 MED ORDER — ROCURONIUM BROMIDE 10 MG/ML (PF) SYRINGE
PREFILLED_SYRINGE | INTRAVENOUS | Status: DC | PRN
  Administered 2019-11-15: 40 mg via INTRAVENOUS

## 2019-11-15 MED ORDER — OXYCODONE HCL 5 MG/5ML PO SOLN: 5.0000 mg | Freq: Once | ORAL | Status: DC | PRN

## 2019-11-15 MED ORDER — FENTANYL CITRATE (PF) 100 MCG/2ML IJ SOLN: 25.0000 ug | INTRAMUSCULAR | Status: DC | PRN

## 2019-11-15 MED ORDER — ACETAMINOPHEN 10 MG/ML IV SOLN: 1000.0000 mg | Freq: Once | INTRAVENOUS | Status: DC | PRN

## 2019-11-15 MED ORDER — LIDOCAINE HCL (CARDIAC) PF 100 MG/5ML IV SOSY
PREFILLED_SYRINGE | INTRAVENOUS | Status: DC | PRN
  Administered 2019-11-15: 40 mg via INTRAVENOUS

## 2019-11-15 SURGICAL SUPPLY — 37 items
ADH SKN CLS APL DERMABOND .7 (GAUZE/BANDAGES/DRESSINGS) ×1
APL PRP STRL LF DISP 70% ISPRP (MISCELLANEOUS) ×1
APPLIER CLIP 5 13 M/L LIGAMAX5 (MISCELLANEOUS) ×3
APR CLP MED LRG 5 ANG JAW (MISCELLANEOUS) ×1
BAG SPEC RTRVL LRG 6X4 10 (ENDOMECHANICALS) ×1
CANISTER SUCT 3000ML PPV (MISCELLANEOUS) ×3 IMPLANT
CHLORAPREP W/TINT 26 (MISCELLANEOUS) ×3 IMPLANT
CLIP APPLIE 5 13 M/L LIGAMAX5 (MISCELLANEOUS) ×1 IMPLANT
COVER SURGICAL LIGHT HANDLE (MISCELLANEOUS) ×3 IMPLANT
COVER WAND RF STERILE (DRAPES) ×3 IMPLANT
DERMABOND ADVANCED (GAUZE/BANDAGES/DRESSINGS) ×2
DERMABOND ADVANCED .7 DNX12 (GAUZE/BANDAGES/DRESSINGS) ×1 IMPLANT
ELECT REM PT RETURN 9FT ADLT (ELECTROSURGICAL) ×3
ELECTRODE REM PT RTRN 9FT ADLT (ELECTROSURGICAL) ×1 IMPLANT
GLOVE SURG SIGNA 7.5 PF LTX (GLOVE) ×3 IMPLANT
GOWN STRL REUS W/ TWL LRG LVL3 (GOWN DISPOSABLE) ×2 IMPLANT
GOWN STRL REUS W/ TWL XL LVL3 (GOWN DISPOSABLE) ×1 IMPLANT
GOWN STRL REUS W/TWL LRG LVL3 (GOWN DISPOSABLE) ×6
GOWN STRL REUS W/TWL XL LVL3 (GOWN DISPOSABLE) ×3
HEMOSTAT SNOW SURGICEL 2X4 (HEMOSTASIS) ×2 IMPLANT
KIT BASIN OR (CUSTOM PROCEDURE TRAY) ×3 IMPLANT
KIT TURNOVER KIT B (KITS) ×3 IMPLANT
NS IRRIG 1000ML POUR BTL (IV SOLUTION) ×3 IMPLANT
PAD ARMBOARD 7.5X6 YLW CONV (MISCELLANEOUS) ×3 IMPLANT
POUCH SPECIMEN RETRIEVAL 10MM (ENDOMECHANICALS) ×3 IMPLANT
SCISSORS LAP 5X35 DISP (ENDOMECHANICALS) ×3 IMPLANT
SET IRRIG TUBING LAPAROSCOPIC (IRRIGATION / IRRIGATOR) ×3 IMPLANT
SET TUBE SMOKE EVAC HIGH FLOW (TUBING) ×3 IMPLANT
SLEEVE ENDOPATH XCEL 5M (ENDOMECHANICALS) ×6 IMPLANT
SPECIMEN JAR SMALL (MISCELLANEOUS) ×3 IMPLANT
SUT MNCRL AB 4-0 PS2 18 (SUTURE) ×5 IMPLANT
TOWEL GREEN STERILE (TOWEL DISPOSABLE) ×3 IMPLANT
TOWEL GREEN STERILE FF (TOWEL DISPOSABLE) ×3 IMPLANT
TRAY LAPAROSCOPIC MC (CUSTOM PROCEDURE TRAY) ×3 IMPLANT
TROCAR XCEL BLUNT TIP 100MML (ENDOMECHANICALS) ×3 IMPLANT
TROCAR XCEL NON-BLD 5MMX100MML (ENDOMECHANICALS) ×3 IMPLANT
WATER STERILE IRR 1000ML POUR (IV SOLUTION) ×3 IMPLANT

## 2019-11-15 NOTE — Op Note (Addendum)
Laparoscopic Cholecystectomy Procedure Note  Indications: This patient presents with symptomatic gallbladder disease and will undergo laparoscopic cholecystectomy.  He had an ERCP yesterday with stone removal from the CBD.  Patient has had a previous PEG as well as a percutaneous cholecytostomy tube and chronic cholecystitis necessitating assistance with the surgery   Pre-operative Diagnosis: chronic cholecystitis with cholelthiasis  Post-operative Diagnosis: Same  Surgeon: Abigail Miyamoto MD                  Laurena Bering, MD  Assistants: 0  Anesthesia: General endotracheal anesthesia  ASA Class: 3  Procedure Details  The patient was seen again in the Holding Room. The risks, benefits, complications, treatment options, and expected outcomes were discussed with the patient. The possibilities of reaction to medication, pulmonary aspiration, perforation of viscus, bleeding, recurrent infection, finding a normal gallbladder, the need for additional procedures, failure to diagnose a condition, the possible need to convert to an open procedure, and creating a complication requiring transfusion or operation were discussed with the patient. The likelihood of improving the patient's symptoms with return to their baseline status is good.  The patient and/or family concurred with the proposed plan, giving informed consent. The site of surgery properly noted. The patient was taken to Operating Room, identified as Thomas Raymond and the procedure verified as Laparoscopic Cholecystectomy with Intraoperative Cholangiogram. A Time Out was held and the above information confirmed.  Prior to the induction of general anesthesia, antibiotic prophylaxis was administered. General endotracheal anesthesia was then administered and tolerated well. After the induction, the abdomen was prepped with Chloraprep and draped in sterile fashion. The patient was positioned in the supine position.  Local anesthetic agent was  injected into the skin near the umbilicus and an incision made. We dissected down to the abdominal fascia with blunt dissection.  The fascia was incised vertically and we entered the peritoneal cavity bluntly.  A pursestring suture of 0-Vicryl was placed around the fascial opening.  The Hasson cannula was inserted and secured with the stay suture.  Pneumoperitoneum was then created with CO2 and tolerated well without any adverse changes in the patient's vital signs. A 5-mm port was placed in the subxiphoid position.  Two 5-mm ports were placed in the right upper quadrant. All skin incisions were infiltrated with a local anesthetic agent before making the incision and placing the trocars.   We positioned the patient in reverse Trendelenburg, tilted slightly to the patient's left.  The gallbladder was identified and was contracted and thick walled with multiple adhesions.   the fundus was grasped and retracted cephalad. Adhesions were lysed bluntly and with the electrocautery where indicated, taking care not to injure any adjacent organs or viscus. The infundibulum was grasped and retracted laterally, exposing the peritoneum overlying the triangle of Calot. This was then divided and exposed in a blunt fashion. The cystic duct was clearly identified and bluntly dissected circumferentially. A critical view of the cystic duct and cystic artery was obtained.  The cystic duct was then ligated with clips and divided. The cystic artery was, dissected free, ligated with clips and divided as well.   The gallbladder was dissected from the liver bed in retrograde fashion with the electrocautery. The gallbladder was removed and placed in an Endocatch sac. The liver bed was irrigated and inspected. Hemostasis was achieved with the electrocautery. Copious irrigation was utilized and was repeatedly aspirated until clear.  The gallbladder and Endocatch sac were then removed through the umbilical port site.  The pursestring suture  was used to close the umbilical fascia.    We again inspected the right upper quadrant for hemostasis.  Pneumoperitoneum was released as we removed the trocars.  4-0 Monocryl was used to close the skin.   Benzoin, steri-strips, and clean dressings were applied. The patient was then extubated and brought to the recovery room in stable condition. Instrument, sponge, and needle counts were correct at closure and at the conclusion of the case.   Findings: Chronic Cholecystitis with Cholelithiasis  Estimated Blood Loss: Minimal         Drains: 0         Specimens: Gallbladder           Complications: None; patient tolerated the procedure well.         Disposition: PACU - hemodynamically stable.         Condition: stable

## 2019-11-15 NOTE — Anesthesia Procedure Notes (Signed)
Procedure Name: Intubation Date/Time: 11/15/2019 10:00 AM Performed by: Babs Bertin, CRNA Pre-anesthesia Checklist: Patient identified, Emergency Drugs available, Suction available and Patient being monitored Patient Re-evaluated:Patient Re-evaluated prior to induction Oxygen Delivery Method: Circle System Utilized Preoxygenation: Pre-oxygenation with 100% oxygen Induction Type: IV induction Ventilation: Mask ventilation without difficulty Laryngoscope Size: Mac and 3 Grade View: Grade III Tube type: Oral Tube size: 7.5 mm Number of attempts: 1 Airway Equipment and Method: Stylet and Oral airway Placement Confirmation: ETT inserted through vocal cords under direct vision,  positive ETCO2 and breath sounds checked- equal and bilateral Secured at: 22 cm Tube secured with: Tape Dental Injury: Teeth and Oropharynx as per pre-operative assessment

## 2019-11-15 NOTE — Transfer of Care (Signed)
Immediate Anesthesia Transfer of Care Note  Patient: Thomas Raymond  Procedure(s) Performed: LAPAROSCOPIC CHOLECYSTECTOMY (N/A Abdomen)  Patient Location: PACU  Anesthesia Type:General  Level of Consciousness: awake, alert  and oriented  Airway & Oxygen Therapy: Patient Spontanous Breathing  Post-op Assessment: Report given to RN and Post -op Vital signs reviewed and stable  Post vital signs: Reviewed and stable  Last Vitals:  Vitals Value Taken Time  BP 177/66 11/15/19 1052  Temp 36.6 C 11/15/19 1050  Pulse 56 11/15/19 1052  Resp 16 11/15/19 1052  SpO2 97 % 11/15/19 1052  Vitals shown include unvalidated device data.  Last Pain:  Vitals:   11/15/19 1050  TempSrc:   PainSc: 0-No pain      Patients Stated Pain Goal: 2 (11/15/19 0748)  Complications: No complications documented.

## 2019-11-15 NOTE — Progress Notes (Signed)
Daily Rounding Note  11/15/2019, 12:20 PM  LOS: 3 days   SUBJECTIVE:   Chief complaint:   Choledocholithiasis.  Cholelithiasis, cholecystitis  Feels cold and having abd pain after AM lap chole  OBJECTIVE:         Vital signs in last 24 hours:    Temp:  [96.1 F (35.6 C)-98.4 F (36.9 C)] 97.9 F (36.6 C) (07/26 1150) Pulse Rate:  [52-65] 56 (07/26 1150) Resp:  [16-24] 17 (07/26 1150) BP: (128-177)/(64-92) 164/81 (07/26 1200) SpO2:  [95 %-99 %] 97 % (07/26 1150) Weight:  [63.6 kg] 63.6 kg (07/26 0926) Last BM Date: 11/14/19 Filed Weights   11/13/19 0023 11/14/19 1007 11/15/19 0926  Weight: 63.6 kg 63.6 kg 63.6 kg   General: somewhat frail, not obviously jaundiced   Heart: RRR Chest: no labored breathing Abdomen: soft, tender bil.  No g/r.  BS quiet.  Intact glued surgical incisions.  Rolling movenent/tremors in muscles of abdomen  Extremities: no CCE Neuro/Psych:  No tremors in head or limbs, just in abdominal muscles.    Intake/Output from previous day: 07/25 0701 - 07/26 0700 In: 1810.1 [I.V.:1610.1; IV Piggyback:200] Out: 650 [Urine:650]  Intake/Output this shift: Total I/O In: 800 [I.V.:700; IV Piggyback:100] Out: 300 [Urine:300]  Lab Results: Recent Labs    11/13/19 0829 11/14/19 0511 11/15/19 0353  WBC 3.6* 4.7 4.7  HGB 13.4 13.5 12.0*  HCT 42.0 41.9 37.2*  PLT 226 243 228   BMET Recent Labs    11/13/19 0829 11/14/19 0511 11/15/19 0353  NA 142 144 143  K 3.1* 3.1* 4.7  CL 106 108 110  CO2 26 26 27   GLUCOSE 127* 107* 135*  BUN 5* <5* 5*  CREATININE <0.30* 0.38* 0.34*  CALCIUM 8.7* 9.0 9.1   LFT Recent Labs    11/13/19 0829 11/14/19 0511 11/15/19 0353  PROT 6.8 6.7 6.0*  ALBUMIN 3.0* 2.8* 2.6*  AST 67* 38 445*  ALT 187* 131* 278*  ALKPHOS 226* 190* 269*  BILITOT 0.9 0.5 0.8   PT/INR Recent Labs    11/13/19 0829  LABPROT 11.3*  INR 0.9   Hepatitis Panel No results  for input(s): HEPBSAG, HCVAB, HEPAIGM, HEPBIGM in the last 72 hours.  Studies/Results: DG ERCP BILIARY & PANCREATIC DUCTS  Result Date: 11/14/2019 CLINICAL DATA:  Elevated LFTs, choledocholithiasis on CT EXAM: ERCP TECHNIQUE: Multiple spot images obtained with the fluoroscopic device and submitted for interpretation post-procedure. COMPARISON:  11/12/2019 FINDINGS: Series of fluoroscopic spot images document endoscopic cannulation and opacification of the CBD. Filling defects are noted in the distal CBD on initial image. There is mild dilatation of the intrahepatic biliary tree, incompletely visualized peripherally. Subsequent images document passage of balloon catheter through the CBD. No extravasation. IMPRESSION: Endoscopic CBD cannulation and intervention These images were submitted for radiologic interpretation only. Please see the procedural report for the amount of contrast and the fluoroscopy time utilized. Electronically Signed   By: 11/14/2019 M.D.   On: 11/14/2019 13:46    ASSESMENT:   *    Choledocholithiasis. 11/14/2019 ERCP, sphincterotomy, stone extraction LFTs had been trending down in the previous 3 days but are back up again today.  Lipase never elevated.  *    Cholelithiasis.  Chronic cholecystitis Lap chole this morning 7/26.  *    ALS, wheelchair-bound.   PLAN   *   LFTs in AM.   Observation and supportive care.      8/26  11/15/2019, 12:20 PM Phone [phone]

## 2019-11-15 NOTE — Progress Notes (Signed)
Patient ID: Thomas Raymond, male   DOB: 1946-01-19, 74 y.o.   MRN: [id number]   S/p ERCP yesterday Needs lap chole today. Discussed with patient and his wife through interpretor.  I discussed the procedure in detail.  The patient was given Agricultural engineer.  We discussed the risks and benefits of a laparoscopic cholecystectomy and possible cholangiogram including, but not limited to bleeding, infection, injury to surrounding structures such as the intestine or liver, bile leak, retained gallstones, need to convert to an open procedure, prolonged diarrhea, blood clots such as  DVT, common bile duct injury, anesthesia risks, and possible need for additional procedures.  The likelihood of improvement in symptoms and return to the patient's normal status is good. We discussed the typical post-operative recovery course.

## 2019-11-15 NOTE — Progress Notes (Signed)
Anesthesia Post Note  Patient: Thomas Raymond  Procedure(s) Performed: Procedure(s) (LRB): LAPAROSCOPIC CHOLECYSTECTOMY (N/A)  Patient location: 5W 534  Post pain:Abd incisions area 6/10. PRN pain med given.  Post assessment: Report received. Pt arrived to his room A/O x4, BP elevated, hydralazine IV given. Abd puncture x4 C/D/I, c/o incisional pain 6/10, MS IV given. Tolerating ice chips and voiding well. Post Op VS per protocol performed. BLE SCDs applied and turned on. Family updated and made aware of pt returned to his room. Post lap chole instructions given. All questions answered.  Last Vitals:  Vitals:   11/15/19 1150 11/15/19 1200  BP: (!) 164/81 (!) 164/81  Pulse: 56   Resp: 17   Temp: 97.9 F (36.6 C)   SpO2: 97%     Post vital signs: 97.9 O, 164/81, 56, 17, 97% RA. Post op cardiac rhythm: SB  Level of consciousness: A/O x4  Complications: None

## 2019-11-15 NOTE — Anesthesia Preprocedure Evaluation (Signed)
Anesthesia Evaluation  Patient identified by MRN, date of birth, ID band Patient awake    Reviewed: Allergy & Precautions, NPO status , Patient's Chart, lab work & pertinent test results, reviewed documented beta blocker date and time   Airway Mallampati: II  TM Distance: >3 FB Neck ROM: Full    Dental  (+) Teeth Intact, Dental Advisory Given   Pulmonary neg pulmonary ROS, neg recent URI,    breath sounds clear to auscultation       Cardiovascular hypertension, Pt. on medications and Pt. on home beta blockers  Rhythm:Regular Rate:Normal     Neuro/Psych ALS wheelchair-bound with weakness of both lower extremities  Neuromuscular disease    GI/Hepatic GERD  Medicated,CBD Stones  Choledocholithiasis   Endo/Other  negative endocrine ROS  Renal/GU negative Renal ROS     Musculoskeletal negative musculoskeletal ROS (+)   Abdominal   Peds  Hematology negative hematology ROS (+)   Anesthesia Other Findings Day of surgery medications reviewed with the patient.  Reproductive/Obstetrics                             Anesthesia Physical Anesthesia Plan  ASA: III  Anesthesia Plan: General   Post-op Pain Management:    Induction: Intravenous  PONV Risk Score and Plan: 2 and Ondansetron and Dexamethasone  Airway Management Planned: Oral ETT  Additional Equipment: None  Intra-op Plan:   Post-operative Plan: Extubation in OR  Informed Consent: I have reviewed the patients History and Physical, chart, labs and discussed the procedure including the risks, benefits and alternatives for the proposed anesthesia with the patient or authorized representative who has indicated his/her understanding and acceptance.     Dental advisory given  Plan Discussed with: CRNA and Surgeon  Anesthesia Plan Comments:         Anesthesia Quick Evaluation

## 2019-11-15 NOTE — Plan of Care (Signed)

## 2019-11-15 NOTE — Progress Notes (Signed)
OT Cancellation Note  Patient Details Name: Thomas Raymond MRN: [id number] DOB: 04/21/1946   Cancelled Treatment:    Reason Eval/Treat Not Completed: Patient at procedure or test/ unavailable.  Patient off floor for surgery.  Will follow up later in day as time allows.  Barbie Banner, OTR/L   Adella Hare 11/15/2019, 11:28 AM

## 2019-11-15 NOTE — Progress Notes (Signed)
PROGRESS NOTE                                                                                                                                                                                                             Patient Demographics:    Thomas Raymond, is a 74 y.o. male, DOB - 04/07/1946, BJY:[id number]  Admit date - 11/12/2019   Admitting Physician Eduard ClosArshad N Kakrakandy, MD  Outpatient Primary MD for the patient is [name], James W, MD  LOS - 3  Chief Complaint  Patient presents with   Abnormal Lab       Brief Narrative  - Thomas Raymond is a 74 y.o. male with history of ALS wheelchair-bound with weakness of both lower extremities presents to the ER after patient was found to have abnormal labs which were done as a routine work-up yesterday by patient's primary care physician.     Subjective:   Patient in bed, appears comfortable, denies any headache, no fever, no chest pain or pressure, no shortness of breath , no abdominal pain. No focal weakness.   Assessment  & Plan :    1.  Elevated LFTs with total bilirubin found incidentally on lab work.  Symptom-free, CT suspicious for choledocholithiasis, seen by GI underwent ERCP with sphincterectomy and CBD stone removal on 11/14/2019, general surgery to do laparoscopic cholecystectomy on 11/15/2019.  2.  ALS.  Wheelchair and bedbound.  Supportive care.  3.  Hypertension.  Placed on beta-blocker + and PRN hydralazine.  4.  Hypokalemia.  Replaced.    Condition - Extremely Guarded  Family Communication  :  None  Code Status :  Full  Consults  :  GI, CCS  Procedures  :    ERCP with sphincterectomy and CBD stone removal on 11/14/2019.    CT - 1. Findings are consistent with choledocholithiasis with intrahepatic and extrahepatic biliary ductal dilatation. 2. Cholelithiasis without acute inflammation. 3. Cardiomegaly. Aortic Atherosclerosis (ICD10-I70.0).  PUD Prophylaxis : PPI  Disposition Plan  :    Status is:  Inpatient  Remains inpatient appropriate because:IV treatments appropriate due to intensity of illness or inability to take PO   Dispo: The patient is from: Home              Anticipated d/c is to: Home              Anticipated d/c date is: 3 days              Patient currently is  not medically stable to d/c.  DVT Prophylaxis  :  Heparin - SCDs   Lab Results  Component Value Date   PLT 228 11/15/2019    Diet :  Diet Order            Diet NPO time specified  Diet effective midnight                  Inpatient Medications Scheduled Meds:  chlorhexidine  15 mL Mouth/Throat Once   chlorhexidine       [MAR Hold] heparin injection (subcutaneous)  5,000 Units Subcutaneous Q8H   [MAR Hold] indomethacin  100 mg Rectal Once   [MAR Hold] metoprolol succinate  50 mg Oral Daily   [MAR Hold] pantoprazole (PROTONIX) IV  40 mg Intravenous Q12H   Continuous Infusions:  [MAR Hold]  ceFAZolin (ANCEF) IV     lactated ringers     PRN Meds:.[MAR Hold] hydrALAZINE, [MAR Hold] metoprolol tartrate, [MAR Hold]  morphine injection, [DISCONTINUED] ondansetron **OR** [MAR Hold] ondansetron (ZOFRAN) IV  Antibiotics  :   Anti-infectives (From admission, onward)   Start     Dose/Rate Route Frequency Ordered Stop   11/15/19 0600  [MAR Hold]  ceFAZolin (ANCEF) IVPB 2g/100 mL premix     Discontinue     (MAR Hold since Mon 11/15/2019 at 0923.Hold Reason: Transfer to a Procedural area.)   2 g 200 mL/hr over 30 Minutes Intravenous On call to O.R. 11/14/19 1457 11/16/19 0559   11/14/19 1130  Ampicillin-Sulbactam (UNASYN) 3 g in sodium chloride 0.9 % 100 mL IVPB  Status:  Discontinued        3 g 200 mL/hr over 30 Minutes Intravenous  Once 11/14/19 1049 11/14/19 1258          Objective:   Vitals:   11/15/19 0400 11/15/19 0748 11/15/19 0901 11/15/19 0926  BP:  (!) 148/79 (!) 146/92   Pulse:  65 64   Resp:  23    Temp: 98.4 F (36.9 C) 97.8 F (36.6 C)    TempSrc: Oral Oral    SpO2:   96%    Weight:    63.6 kg  Height:    5' 0.98" (1.549 m)    SpO2: 96 %  Wt Readings from Last 3 Encounters:  11/15/19 63.6 kg  09/19/16 68.5 kg  05/30/16 70.3 kg     Intake/Output Summary (Last 24 hours) at 11/15/2019 0932 Last data filed at 11/15/2019 0400 Gross per 24 hour  Intake 1810.11 ml  Output 650 ml  Net 1160.11 ml     Physical Exam  Awake Alert, No new F.N deficits, Normal affect [place].AT,PERRAL Supple Neck,No JVD, No cervical lymphadenopathy appriciated.  Symmetrical Chest wall movement, Good air movement bilaterally, CTAB RRR,No Gallops, Rubs or new Murmurs, No Parasternal Heave +ve B.Sounds, Abd Soft, No tenderness, No organomegaly appriciated, No rebound - guarding or rigidity. No Cyanosis, Clubbing or edema, No new Rash or bruise      Data Review:    Recent Labs  Lab 11/11/19 1358 11/12/19 0944 11/13/19 0829 11/14/19 0511 11/15/19 0353  WBC 7.9 4.7 3.6* 4.7 4.7  HGB 14.6 13.9 13.4 13.5 12.0*  HCT 45.1 44.4 42.0 41.9 37.2*  PLT 233 226 226 243 228  MCV 90.0 92.9 89.9 90.5 89.9  MCH 29.1 29.1 28.7 29.2 29.0  MCHC 32.4 31.3 31.9 32.2 32.3  RDW 12.8 13.5 13.3 13.5 13.6  LYMPHSABS 932  --   --  1.3 1.0  MONOABS  --   --   --  0.7 0.6  EOSABS 24  --   --  0.2 0.0  BASOSABS 32  --   --  0.1 0.0    Recent Labs  Lab 11/11/19 1357 11/12/19 0944 11/13/19 0829 11/14/19 0511 11/15/19 0353  NA 137 138 142 144 143  K 3.4* 3.1* 3.1* 3.1* 4.7  CL 99 101 106 108 110  CO2 25 28 26 26 27   GLUCOSE 92 138* 127* 107* 135*  BUN 10 9 5* <5* 5*  CREATININE 0.33* 0.45* <0.30* 0.38* 0.34*  CALCIUM 9.3 9.1 8.7* 9.0 9.1  AST 280* 159* 67* 38 445*  ALT 402* 277* 187* 131* 278*  ALKPHOS  --  256* 226* 190* 269*  BILITOT 3.1* 1.7* 0.9 0.5 0.8  ALBUMIN  --  3.2* 3.0* 2.8* 2.6*  MG  --   --  1.9 1.9 1.9  CRP  --   --  3.1* 1.1* 0.7  PROCALCITON  --   --  9.14 5.21 1.35  INR  --   --  0.9  --   --   BNP  --   --  46.4 48.5 43.3    Recent Labs  Lab  11/12/19 2120 11/13/19 0829 11/14/19 0511 11/15/19 0353  CRP  --  3.1* 1.1* 0.7  BNP  --  46.4 48.5 43.3  PROCALCITON  --  9.14 5.21 1.35  SARSCOV2NAA NEGATIVE  --   --   --     ------------------------------------------------------------------------------------------------------------------ No results for input(s): CHOL, HDL, LDLCALC, TRIG, CHOLHDL, LDLDIRECT in the last 72 hours.  Lab Results  Component Value Date   HGBA1C 5.8 08/27/2019   ------------------------------------------------------------------------------------------------------------------ No results for input(s): TSH, T4TOTAL, T3FREE, THYROIDAB in the last 72 hours.  Invalid input(s): FREET3 ------------------------------------------------------------------------------------------------------------------ No results for input(s): VITAMINB12, FOLATE, FERRITIN, TIBC, IRON, RETICCTPCT in the last 72 hours.  Coagulation profile Recent Labs  Lab 11/13/19 0829  INR 0.9    No results for input(s): DDIMER in the last 72 hours.  Cardiac Enzymes No results for input(s): CKMB, TROPONINI, MYOGLOBIN in the last 168 hours.  Invalid input(s): CK ------------------------------------------------------------------------------------------------------------------    Component Value Date/Time   BNP 43.3 11/15/2019 0353    Micro Results Recent Results (from the past 240 hour(s))  Urine Culture     Status: None   Collection Time: 11/11/19  1:58 PM   Specimen: Blood  Result Value Ref Range Status   Source: NOT GIVEN  Final   Status: FINAL  Final   Isolate 1:   Final    Growth of mixed flora was isolated, suggesting probable contamination. No further testing will be performed. If clinically indicated, recollection using a method to minimize contamination, with prompt transfer to Urine Culture Transport Tube, is  recommended.   SARS Coronavirus 2 by RT PCR (hospital order, performed in [facility] hospital lab)  Nasopharyngeal Nasopharyngeal Swab     Status: None   Collection Time: 11/12/19  9:20 PM   Specimen: Nasopharyngeal Swab  Result Value Ref Range Status   SARS Coronavirus 2 NEGATIVE NEGATIVE Final    Comment: (NOTE) SARS-CoV-2 target nucleic acids are NOT DETECTED.  The SARS-CoV-2 RNA is generally detectable in upper and lower respiratory specimens during the acute phase of infection. The lowest concentration of SARS-CoV-2 viral copies this assay can detect is 250 copies / mL. A negative result does not preclude SARS-CoV-2 infection and should not be used as the sole basis for treatment or other patient management decisions.  A negative result may occur with  improper specimen collection / handling, submission of specimen other than nasopharyngeal swab, presence of viral mutation(s) within the areas targeted by this assay, and inadequate number of viral copies (<250 copies / mL). A negative result must be combined with clinical observations, patient history, and epidemiological information.  Fact Sheet for Patients:   BoilerBrush.com.cy  Fact Sheet for Healthcare Providers: https://pope.com/  This test is not yet approved or  cleared by the [place] FDA and has been authorized for detection and/or diagnosis of SARS-CoV-2 by FDA under an Emergency Use Authorization (EUA).  This EUA will remain in effect (meaning this test can be used) for the duration of the COVID-19 declaration under Section 564(b)(1) of the Act, 21 U.S.C. section 360bbb-3(b)(1), unless the authorization is terminated or revoked sooner.  Performed at [facility], [address] [place], [place] [address]   MRSA PCR Screening     Status: None   Collection Time: 11/13/19  2:26 PM   Specimen: Nasal Mucosa; Nasopharyngeal  Result Value Ref Range Status   MRSA by PCR NEGATIVE NEGATIVE Final    Comment:        The GeneXpert MRSA Assay (FDA approved for  NASAL specimens only), is one component of a comprehensive MRSA colonization surveillance program. It is not intended to diagnose MRSA infection nor to guide or monitor treatment for MRSA infections. Performed at [facility], [address] [place], [place] [address]     Radiology Reports CT ABDOMEN PELVIS W CONTRAST  Result Date: 11/12/2019 CLINICAL DATA:  Diverticulitis. Elevated liver enzymes with left lower quadrant pain. EXAM: CT ABDOMEN AND PELVIS WITH CONTRAST TECHNIQUE: Multidetector CT imaging of the abdomen and pelvis was performed using the standard protocol following bolus administration of intravenous contrast. CONTRAST:  OMNIPAQUE IOHEXOL 300 MG/ML  SOLN COMPARISON:  Ultrasound from the same day. CT dated October 16, 2015. CT dated November 21, 2015. FINDINGS: Lower chest: The lung bases are clear. The heart is enlarged. Hepatobiliary: The liver is normal. Cholelithiasis without acute inflammation.There is intrahepatic and extrahepatic biliary ductal dilatation. The common bile duct measures up to approximately 0.9 cm. Multiple filling defects are noted within the distal common bile duct (coronal series 6, image 57). Pancreas: Normal contours without ductal dilatation. No peripancreatic fluid collection. Spleen: Unremarkable. Adrenals/Urinary Tract: --Adrenal glands: Unremarkable. --Right kidney/ureter: No hydronephrosis or radiopaque kidney stones. --Left kidney/ureter: No hydronephrosis or radiopaque kidney stones. --Urinary bladder: Unremarkable. Stomach/Bowel: --Stomach/Duodenum: No hiatal hernia or other gastric abnormality. Normal duodenal course and caliber. --Small bowel: Unremarkable. --Colon: Unremarkable. --Appendix: Normal. Vascular/Lymphatic: Atherosclerotic calcification is present within the non-aneurysmal abdominal aorta, without hemodynamically significant stenosis. --No retroperitoneal lymphadenopathy. --No mesenteric lymphadenopathy. --No pelvic or inguinal  lymphadenopathy. Reproductive: Unremarkable Other: No ascites or free air. The abdominal wall is normal. Musculoskeletal. The patient is status post prior left-sided intramedullary nail placement through the left femur. IMPRESSION: 1. Findings are consistent with choledocholithiasis with intrahepatic and extrahepatic biliary ductal dilatation. 2. Cholelithiasis without acute inflammation. 3. Cardiomegaly. Aortic Atherosclerosis (ICD10-I70.0). Electronically Signed   By: Katherine Mantle M.D.   On: 11/12/2019 19:37   DG ERCP BILIARY & PANCREATIC DUCTS  Result Date: 11/14/2019 CLINICAL DATA:  Elevated LFTs, choledocholithiasis on CT EXAM: ERCP TECHNIQUE: Multiple spot images obtained with the fluoroscopic device and submitted for interpretation post-procedure. COMPARISON:  11/12/2019 FINDINGS: Series of fluoroscopic spot images document endoscopic cannulation and opacification of the CBD. Filling defects are noted in the distal CBD on initial image. There is mild dilatation  of the intrahepatic biliary tree, incompletely visualized peripherally. Subsequent images document passage of balloon catheter through the CBD. No extravasation. IMPRESSION: Endoscopic CBD cannulation and intervention These images were submitted for radiologic interpretation only. Please see the procedural report for the amount of contrast and the fluoroscopy time utilized. Electronically Signed   By: Corlis Leak M.D.   On: 11/14/2019 13:46   DG Abd Acute W/Chest  Result Date: 11/12/2019 CLINICAL DATA:  Abdominal pain. EXAM: DG ABDOMEN ACUTE W/ 1V CHEST COMPARISON:  11/21/2015 FINDINGS: There is elevation of the right hemidiaphragm. The heart size is normal. Aortic calcifications are noted. There is no significant pleural effusion or focal infiltrate. The bowel gas pattern is nonobstructive. There are no radiopaque kidney stones. IMPRESSION: Negative abdominal radiographs.  No acute cardiopulmonary disease. Electronically Signed   By:  Katherine Mantle M.D.   On: 11/12/2019 19:38   US Abdomen Limited RUQ  Result Date: 11/12/2019 CLINICAL DATA:  Elevated LFTs, recurrent acalculous cholecystitis. EXAM: ULTRASOUND ABDOMEN LIMITED RIGHT UPPER QUADRANT COMPARISON:  Ultrasound 10/17/2015, CT 12/11/2015 the yeah sounds go thanks neck as the FINDINGS: Gallbladder: Gallbladder is poorly visualized largely contracted around echogenic stones and biliary sludge, largest calculus measuring up to 3 mm in maximal dimension with posterior shadowing. Wall is borderline thickened at 4 mm though possibility of to the underdistention. Sonographic Murphy sign reportedly negative by sonographer. Common bile duct: Diameter: 3 mm, nondilated. Liver: Mildly nodular liver surface contour. Heterogeneous echotexture with mildly increased echogenicity and diminished through transmission. Portal vein is patent on color Doppler imaging with normal direction of blood flow towards the liver. Other: Multiple anechoic simple appearing right renal cysts noted incidentally, largest measuring up to 1.5 cm in size. IMPRESSION: Mildly nodular liver surface contour with heterogeneous echotexture and mildly increased echogenicity. Features are suggestive of cirrhosis/intrinsic liver disease with possible fatty infiltration. Gallbladder appears contracted around a combination of echogenic stones and biliary sludge. Wall thickening possibly related to underdistention/contraction. The absence of a sonographic Eulah Pont sign is reassuring however if there is clinical concern for acute cholecystitis, consider HIDA for further evaluation. Electronically Signed   By: Kreg Shropshire M.D.   On: 11/12/2019 17:45    Time Spent in minutes  30   Susa Raring M.D on 11/15/2019 at 9:32 AM  To page go to [email or web address] - password [facility]

## 2019-11-16 ENCOUNTER — Encounter (HOSPITAL_COMMUNITY): Payer: Self-pay | Admitting: Surgery

## 2019-11-16 LAB — C-REACTIVE PROTEIN: CRP: 0.7 mg/dL (ref <1.0)

## 2019-11-16 LAB — CBC WITH DIFFERENTIAL/PLATELET
Abs Immature Granulocytes: 0.04 10*3/uL (ref 0.00–0.07)
Basophils Absolute: 0 10*3/uL (ref 0.0–0.1)
Basophils Relative: 0 %
Eosinophils Absolute: 0 10*3/uL (ref 0.0–0.5)
Eosinophils Relative: 0 %
HCT: 39.4 % (ref 39.0–52.0)
Hemoglobin: 12.5 g/dL — ABNORMAL LOW (ref 13.0–17.0)
Immature Granulocytes: 1 %
Lymphocytes Relative: 19 %
Lymphs Abs: 1.2 10*3/uL (ref 0.7–4.0)
MCH: 28.7 pg (ref 26.0–34.0)
MCHC: 31.7 g/dL (ref 30.0–36.0)
MCV: 90.6 fL (ref 80.0–100.0)
Monocytes Absolute: 0.7 10*3/uL (ref 0.1–1.0)
Monocytes Relative: 10 %
Neutro Abs: 4.7 10*3/uL (ref 1.7–7.7)
Neutrophils Relative %: 70 %
Platelets: 241 10*3/uL (ref 150–400)
RBC: 4.35 MIL/uL (ref 4.22–5.81)
RDW: 13.9 % (ref 11.5–15.5)
WBC: 6.7 10*3/uL (ref 4.0–10.5)
nRBC: 0 % (ref 0.0–0.2)

## 2019-11-16 LAB — COMPREHENSIVE METABOLIC PANEL
ALT: 197 U/L — ABNORMAL HIGH (ref 0–44)
AST: 141 U/L — ABNORMAL HIGH (ref 15–41)
Albumin: 2.9 g/dL — ABNORMAL LOW (ref 3.5–5.0)
Alkaline Phosphatase: 247 U/L — ABNORMAL HIGH (ref 38–126)
Anion gap: 6 (ref 5–15)
BUN: 5 mg/dL — ABNORMAL LOW (ref 8–23)
CO2: 31 mmol/L (ref 22–32)
Calcium: 9.3 mg/dL (ref 8.9–10.3)
Chloride: 102 mmol/L (ref 98–111)
Creatinine, Ser: 0.37 mg/dL — ABNORMAL LOW (ref 0.61–1.24)
GFR calc Af Amer: >60 mL/min (ref >60)
GFR calc non Af Amer: >60 mL/min (ref >60)
Glucose, Bld: 118 mg/dL — ABNORMAL HIGH (ref 70–99)
Potassium: 4.7 mmol/L (ref 3.5–5.1)
Sodium: 139 mmol/L (ref 135–145)
Total Bilirubin: 0.5 mg/dL (ref 0.3–1.2)
Total Protein: 6.7 g/dL (ref 6.5–8.1)

## 2019-11-16 LAB — MAGNESIUM: Magnesium: 2 mg/dL (ref 1.7–2.4)

## 2019-11-16 LAB — BRAIN NATRIURETIC PEPTIDE: B Natriuretic Peptide: 89.7 pg/mL (ref 0.0–100.0)

## 2019-11-16 LAB — PROCALCITONIN: Procalcitonin: 1.41 ng/mL

## 2019-11-16 LAB — SURGICAL PATHOLOGY

## 2019-11-16 MED ORDER — ACETAMINOPHEN 325 MG PO TABS: 650.0000 mg | ORAL_TABLET | Freq: Four times a day (QID) | ORAL | 0 refills | Status: DC | PRN

## 2019-11-16 MED ORDER — GABAPENTIN 100 MG PO CAPS
100.0000 mg | ORAL_CAPSULE | Freq: Three times a day (TID) | ORAL | 0 refills | Status: DC
Start: 2019-11-16 — End: 2020-10-13

## 2019-11-16 NOTE — Progress Notes (Signed)
[facility] Progress Note  1 Day Post-Op  Subjective: CC:  States pain is controlled. Tolerating liquids. Voiding. +flatus. Denies BM. Has not been OOB  Objective: Vital signs in last 24 hours: Temp:  [97.5 F (36.4 C)-98 F (36.7 C)] 97.5 F (36.4 C) (07/27 0530) Pulse Rate:  [56-65] 60 (07/27 0530) Resp:  [16-21] 18 (07/27 0530) BP: (110-177)/(63-93) 143/63 (07/27 0530) SpO2:  [94 %-99 %] 98 % (07/27 0530) Last BM Date: 11/14/19  Intake/Output from previous day: 07/26 0701 - 07/27 0700 In: 1000 [P.O.:200; I.V.:700; IV Piggyback:100] Out: 1125 [Urine:1125] Intake/Output this shift: No intake/output data recorded.  PE: Gen:  Alert, NAD, pleasant Card:  Regular rate and rhythm, pedal pulses 2+ BL Pulm:  Normal effort, clear to auscultation bilaterally Abd: Soft, approp tender, incisions c/d/i  Skin: warm and dry, no rashes  Psych: A&Ox3   Lab Results:  Recent Labs    11/15/19 0353 11/16/19 0405  WBC 4.7 6.7  HGB 12.0* 12.5*  HCT 37.2* 39.4  PLT 228 241   BMET Recent Labs    11/15/19 0353 11/16/19 0405  NA 143 139  K 4.7 4.7  CL 110 102  CO2 27 31  GLUCOSE 135* 118*  BUN 5* 5*  CREATININE 0.34* 0.37*  CALCIUM 9.1 9.3   PT/INR No results for input(s): LABPROT, INR in the last 72 hours. CMP     Component Value Date/Time   NA 139 11/16/2019 0405   K 4.7 11/16/2019 0405   CL 102 11/16/2019 0405   CO2 31 11/16/2019 0405   GLUCOSE 118 (H) 11/16/2019 0405   BUN 5 (L) 11/16/2019 0405   CREATININE 0.37 (L) 11/16/2019 0405   CREATININE 0.33 (L) 11/11/2019 1357   CALCIUM 9.3 11/16/2019 0405   PROT 6.7 11/16/2019 0405   ALBUMIN 2.9 (L) 11/16/2019 0405   AST 141 (H) 11/16/2019 0405   ALT 197 (H) 11/16/2019 0405   ALKPHOS 247 (H) 11/16/2019 0405   BILITOT 0.5 11/16/2019 0405   GFRNONAA >60 11/16/2019 0405   GFRNONAA 126 11/11/2019 1357   GFRAA >60 11/16/2019 0405   GFRAA 146 11/11/2019 1357   Lipase     Component Value Date/Time    LIPASE 29 11/12/2019 0944       Studies/Results: DG ERCP BILIARY & PANCREATIC DUCTS  Result Date: 11/14/2019 CLINICAL DATA:  Elevated LFTs, choledocholithiasis on CT EXAM: ERCP TECHNIQUE: Multiple spot images obtained with the fluoroscopic device and submitted for interpretation post-procedure. COMPARISON:  11/12/2019 FINDINGS: Series of fluoroscopic spot images document endoscopic cannulation and opacification of the CBD. Filling defects are noted in the distal CBD on initial image. There is mild dilatation of the intrahepatic biliary tree, incompletely visualized peripherally. Subsequent images document passage of balloon catheter through the CBD. No extravasation. IMPRESSION: Endoscopic CBD cannulation and intervention These images were submitted for radiologic interpretation only. Please see the procedural report for the amount of contrast and the fluoroscopy time utilized. Electronically Signed   By: Corlis Leak M.D.   On: 11/14/2019 13:46    Anti-infectives: Anti-infectives (From admission, onward)   Start     Dose/Rate Route Frequency Ordered Stop   11/15/19 0600  ceFAZolin (ANCEF) IVPB 2g/100 mL premix        2 g 200 mL/hr over 30 Minutes Intravenous On call to O.R. 11/14/19 1457 11/15/19 1035   11/14/19 1130  Ampicillin-Sulbactam (UNASYN) 3 g in sodium chloride 0.9 % 100 mL IVPB  Status:  Discontinued  3 g 200 mL/hr over 30 Minutes Intravenous  Once 11/14/19 1049 11/14/19 1258     Assessment/Plan ALS Choledocholithiasis s/p ERCP 8/25  POD#1 S/p lap chole 7/26 Dr. Magnus Ivan - AFVSS - pain controlled  - advance diet as tolerated  - PT/OT -stable for discharge from [facility] perspective. Follow up provided.    LOS: 4 days    Hosie Spangle, [facility] Please see Amion for pager number during day hours 7:00am-4:30pm

## 2019-11-16 NOTE — Anesthesia Postprocedure Evaluation (Signed)
Anesthesia Post Note  Patient: Thomas Raymond  Procedure(s) Performed: LAPAROSCOPIC CHOLECYSTECTOMY (N/A Abdomen)     Patient location during evaluation: PACU Anesthesia Type: General Level of consciousness: awake and alert Pain management: pain level controlled Vital Signs Assessment: post-procedure vital signs reviewed and stable Respiratory status: spontaneous breathing, nonlabored ventilation, respiratory function stable and patient connected to nasal cannula oxygen Cardiovascular status: blood pressure returned to baseline and stable Postop Assessment: no apparent nausea or vomiting Anesthetic complications: no   No complications documented.  Last Vitals:  Vitals:   11/15/19 2050 11/16/19 0530  BP: (!) 149/73 (!) 143/63  Pulse: 58 60  Resp: 20 18  Temp: 36.5 C (!) 36.4 C  SpO2: 94% 98%    Last Pain:  Vitals:   11/16/19 0715  TempSrc:   PainSc: 0-No pain                 Becket Wecker

## 2019-11-16 NOTE — Discharge Summary (Signed)
Thomas Raymond DOB: January 28, 1946 DOA: 11/12/2019  PCP: Corwin Levins, MD  Admit date: 11/12/2019  Discharge date: 11/16/2019  Admitted From: Home   Disposition:  Home   Recommendations for Outpatient Follow-up:   Follow up with PCP in 1-2 weeks  PCP Please obtain BMP/CBC, 2 view CXR in 1week,  (see Discharge instructions)   PCP Please follow up on the following pending results:    Home Health: None   Equipment/Devices: None  Consultations: [facility], GI Discharge Condition: Stable    CODE STATUS: Full    Diet Recommendation: Heart Healthy   Diet Order            Diet - low sodium heart healthy           Diet clear liquid Room service appropriate? Yes; Fluid consistency: Thin  Diet effective now                  Chief Complaint  Patient presents with  . Abnormal Lab     Brief history of present illness from the day of admission and additional interim summary    53 Thomas Raymond a 74 y.o.malewithhistory of ALS wheelchair-bound with weakness of both lower extremities presents to the ER after patient was found to have abnormal labs which were done as a routine work-up yesterday by patient's primary care physician.                                                                  Hospital Course    1.  Elevated LFTs with total bilirubin found incidentally on lab work.  Symptom-free, CT suspicious for choledocholithiasis, seen by GI underwent ERCP with sphincterectomy and CBD stone removal on 11/14/2019, followed by laparoscopic cholecystectomy by general surgery on 11/15/2019 he is now symptom-free and eager to go home will be discharged home with outpatient PCP and general surgery follow-up, may also consider one-time outpatient [facility] follow-up, will defer this to PCP..  2.  ALS.  Wheelchair and  bedbound.  Supportive care as before to continue.  3.  Hypertension.  Continue home dose beta-blocker.  4.  Tonic tingling in the legs.  Likely peripheral neuropathy, low-dose Neurontin, PCP to monitor, may check outpatient TSH and B12.  Discharge diagnosis     Principal Problem:   Choledocholithiasis Active Problems:   ALS   Essential hypertension    Discharge instructions    Discharge Instructions    Diet - low sodium heart healthy   Complete by: As directed    Discharge instructions   Complete by: As directed    Follow with Primary MD Corwin Levins, MD in 7 days   Get CBC, CMP, 2 view Chest X ray -  checked next visit within 1 week by Primary MD   Activity: As  tolerated with Full fall precautions use walker/cane & assistance as needed  Disposition Home    Diet: Heart Healthy   Special Instructions: If you have smoked or chewed Tobacco  in the last 2 yrs please stop smoking, stop any regular Alcohol  and or any Recreational drug use.  On your next visit with your primary care physician please Get Medicines reviewed and adjusted.  Please request your Prim.MD to go over all Hospital Tests and Procedure/Radiological results at the follow up, please get all Hospital records sent to your Prim MD by signing hospital release before you go home.  If you experience worsening of your admission symptoms, develop shortness of breath, life threatening emergency, suicidal or homicidal thoughts you must seek medical attention immediately by calling 911 or calling your MD immediately  if symptoms less severe.  You Must read complete instructions/literature along with all the possible adverse reactions/side effects for all the Medicines you take and that have been prescribed to you. Take any new Medicines after you have completely understood and accpet all the possible adverse reactions/side effects.   Increase activity slowly   Complete by: As directed    Leave dressing on - Keep it  clean, dry, and intact until clinic visit   Complete by: As directed       Discharge Medications   Allergies as of 11/16/2019   No Known Allergies     Medication List    STOP taking these medications   albuterol 108 (90 Base) MCG/ACT inhaler Commonly known as: VENTOLIN HFA   meclizine 12.5 MG tablet Commonly known as: ANTIVERT     TAKE these medications   acetaminophen 325 MG tablet Commonly known as: TYLENOL Take 2 tablets (650 mg total) by mouth every 6 (six) hours as needed for mild pain or moderate pain (or temp > 100). What changed: reasons to take this   amLODipine 10 MG tablet Commonly known as: NORVASC Take 1 tablet (10 mg total) by mouth daily.   cetirizine 10 MG tablet Commonly known as: ZYRTEC TAKE 1 TABLET BY MOUTH EVERY DAY   gabapentin 100 MG capsule Commonly known as: Neurontin Take 1 capsule (100 mg total) by mouth 3 (three) times daily.   metoprolol succinate 50 MG 24 hr tablet Commonly known as: TOPROL-XL Take 1 tablet (50 mg total) by mouth daily.   omeprazole 20 MG capsule Commonly known as: PRILOSEC Take 1 capsule (20 mg total) by mouth daily.   rosuvastatin 20 MG tablet Commonly known as: CRESTOR TAKE 1 TABLET BY MOUTH EVERY DAY            Discharge Care Instructions  (From admission, onward)         Start     Ordered   11/16/19 0000  Leave dressing on - Keep it clean, dry, and intact until clinic visit        11/16/19 0923           Follow-up Information    [facility], [facility]. Go on 12/07/2019.   Specialty: General Surgery Why: Follow up appointment scheduled for 8:45 AM. Please arrive 30 min prior to appointment time. Bring photo ID and insurance information.  Contact information: [address] [place] [address] [phone]        Corwin Levins, MD. Schedule an appointment as soon as possible for a visit in 1 week(s).   Specialties: Internal Medicine, Radiology Contact information: [address] [place] [address] [phone]  Major procedures and Radiology Reports - PLEASE review detailed and final reports thoroughly  -        CT ABDOMEN PELVIS W CONTRAST  Result Date: 11/12/2019 CLINICAL DATA:  Diverticulitis. Elevated liver enzymes with left lower quadrant pain. EXAM: CT ABDOMEN AND PELVIS WITH CONTRAST TECHNIQUE: Multidetector CT imaging of the abdomen and pelvis was performed using the standard protocol following bolus administration of intravenous contrast. CONTRAST:  OMNIPAQUE IOHEXOL 300 MG/ML  SOLN COMPARISON:  Ultrasound from the same day. CT dated October 16, 2015. CT dated November 21, 2015. FINDINGS: Lower chest: The lung bases are clear. The heart is enlarged. Hepatobiliary: The liver is normal. Cholelithiasis without acute inflammation.There is intrahepatic and extrahepatic biliary ductal dilatation. The common bile duct measures up to approximately 0.9 cm. Multiple filling defects are noted within the distal common bile duct (coronal series 6, image 57). Pancreas: Normal contours without ductal dilatation. No peripancreatic fluid collection. Spleen: Unremarkable. Adrenals/Urinary Tract: --Adrenal glands: Unremarkable. --Right kidney/ureter: No hydronephrosis or radiopaque kidney stones. --Left kidney/ureter: No hydronephrosis or radiopaque kidney stones. --Urinary bladder: Unremarkable. Stomach/Bowel: --Stomach/Duodenum: No hiatal hernia or other gastric abnormality. Normal duodenal course and caliber. --Small bowel: Unremarkable. --Colon: Unremarkable. --Appendix: Normal. Vascular/Lymphatic: Atherosclerotic calcification is present within the non-aneurysmal abdominal aorta, without hemodynamically significant stenosis. --No retroperitoneal lymphadenopathy. --No mesenteric lymphadenopathy. --No pelvic or inguinal lymphadenopathy. Reproductive: Unremarkable Other: No ascites or free air. The abdominal wall is normal. Musculoskeletal. The  patient is status post prior left-sided intramedullary nail placement through the left femur. IMPRESSION: 1. Findings are consistent with choledocholithiasis with intrahepatic and extrahepatic biliary ductal dilatation. 2. Cholelithiasis without acute inflammation. 3. Cardiomegaly. Aortic Atherosclerosis (ICD10-I70.0). Electronically Signed   By: Katherine Mantle M.D.   On: 11/12/2019 19:37   DG ERCP BILIARY & PANCREATIC DUCTS  Result Date: 11/14/2019 CLINICAL DATA:  Elevated LFTs, choledocholithiasis on CT EXAM: ERCP TECHNIQUE: Multiple spot images obtained with the fluoroscopic device and submitted for interpretation post-procedure. COMPARISON:  11/12/2019 FINDINGS: Series of fluoroscopic spot images document endoscopic cannulation and opacification of the CBD. Filling defects are noted in the distal CBD on initial image. There is mild dilatation of the intrahepatic biliary tree, incompletely visualized peripherally. Subsequent images document passage of balloon catheter through the CBD. No extravasation. IMPRESSION: Endoscopic CBD cannulation and intervention These images were submitted for radiologic interpretation only. Please see the procedural report for the amount of contrast and the fluoroscopy time utilized. Electronically Signed   By: Corlis Leak M.D.   On: 11/14/2019 13:46   DG Abd Acute W/Chest  Result Date: 11/12/2019 CLINICAL DATA:  Abdominal pain. EXAM: DG ABDOMEN ACUTE W/ 1V CHEST COMPARISON:  11/21/2015 FINDINGS: There is elevation of the right hemidiaphragm. The heart size is normal. Aortic calcifications are noted. There is no significant pleural effusion or focal infiltrate. The bowel gas pattern is nonobstructive. There are no radiopaque kidney stones. IMPRESSION: Negative abdominal radiographs.  No acute cardiopulmonary disease. Electronically Signed   By: Katherine Mantle M.D.   On: 11/12/2019 19:38   US Abdomen Limited RUQ  Result Date: 11/12/2019 CLINICAL DATA:  Elevated  LFTs, recurrent acalculous cholecystitis. EXAM: ULTRASOUND ABDOMEN LIMITED RIGHT UPPER QUADRANT COMPARISON:  Ultrasound 10/17/2015, CT 12/11/2015 the yeah sounds go thanks neck as the FINDINGS: Gallbladder: Gallbladder is poorly visualized largely contracted around echogenic stones and biliary sludge, largest calculus measuring up to 3 mm in maximal dimension with posterior shadowing. Wall is borderline thickened at 4 mm though possibility of to the underdistention. Sonographic Eulah Pont sign  reportedly negative by sonographer. Common bile duct: Diameter: 3 mm, nondilated. Liver: Mildly nodular liver surface contour. Heterogeneous echotexture with mildly increased echogenicity and diminished through transmission. Portal vein is patent on color Doppler imaging with normal direction of blood flow towards the liver. Other: Multiple anechoic simple appearing right renal cysts noted incidentally, largest measuring up to 1.5 cm in size. IMPRESSION: Mildly nodular liver surface contour with heterogeneous echotexture and mildly increased echogenicity. Features are suggestive of cirrhosis/intrinsic liver disease with possible fatty infiltration. Gallbladder appears contracted around a combination of echogenic stones and biliary sludge. Wall thickening possibly related to underdistention/contraction. The absence of a sonographic Eulah Pont sign is reassuring however if there is clinical concern for acute cholecystitis, consider HIDA for further evaluation. Electronically Signed   By: Kreg Shropshire M.D.   On: 11/12/2019 17:45    Micro Results     Recent Results (from the past 240 hour(s))  Urine Culture     Status: None   Collection Time: 11/11/19  1:58 PM   Specimen: Blood  Result Value Ref Range Status   Source: NOT GIVEN  Final   Status: FINAL  Final   Isolate 1:   Final    Growth of mixed flora was isolated, suggesting probable contamination. No further testing will be performed. If clinically indicated, recollection  using a method to minimize contamination, with prompt transfer to Urine Culture Transport Tube, is  recommended.   SARS Coronavirus 2 by RT PCR (hospital order, performed in [facility] hospital lab) Nasopharyngeal Nasopharyngeal Swab     Status: None   Collection Time: 11/12/19  9:20 PM   Specimen: Nasopharyngeal Swab  Result Value Ref Range Status   SARS Coronavirus 2 NEGATIVE NEGATIVE Final    Comment: (NOTE) SARS-CoV-2 target nucleic acids are NOT DETECTED.  The SARS-CoV-2 RNA is generally detectable in upper and lower respiratory specimens during the acute phase of infection. The lowest concentration of SARS-CoV-2 viral copies this assay can detect is 250 copies / mL. A negative result does not preclude SARS-CoV-2 infection and should not be used as the sole basis for treatment or other patient management decisions.  A negative result may occur with improper specimen collection / handling, submission of specimen other than nasopharyngeal swab, presence of viral mutation(s) within the areas targeted by this assay, and inadequate number of viral copies (<250 copies / mL). A negative result must be combined with clinical observations, patient history, and epidemiological information.  Fact Sheet for Patients:   BoilerBrush.com.cy  Fact Sheet for Healthcare Providers: https://pope.com/  This test is not yet approved or  cleared by the [place] FDA and has been authorized for detection and/or diagnosis of SARS-CoV-2 by FDA under an Emergency Use Authorization (EUA).  This EUA will remain in effect (meaning this test can be used) for the duration of the COVID-19 declaration under Section 564(b)(1) of the Act, 21 U.S.C. section 360bbb-3(b)(1), unless the authorization is terminated or revoked sooner.  Performed at [facility], [address], [place], [place] [address]   MRSA PCR Screening     Status: None   Collection  Time: 11/13/19  2:26 PM   Specimen: Nasal Mucosa; Nasopharyngeal  Result Value Ref Range Status   MRSA by PCR NEGATIVE NEGATIVE Final    Comment:        The GeneXpert MRSA Assay (FDA approved for NASAL specimens only), is one component of a comprehensive MRSA colonization surveillance program. It is not intended to diagnose MRSA infection  nor to guide or monitor treatment for MRSA infections. Performed at [facility], [address] [place], [place] [address]     Today   Subjective    Thomas Raymond today has no headache,no chest abdominal pain,no new weakness tingling or numbness, feels much better wants to go home today.     Objective   Blood pressure (!) 143/63, pulse 60, temperature (!) 97.5 F (36.4 C), temperature source Oral, resp. rate 18, height 5' 0.98" (1.549 m), weight 63.6 kg, SpO2 98 %.   Intake/Output Summary (Last 24 hours) at 11/16/2019 0929 Last data filed at 11/16/2019 0550 Gross per 24 hour  Intake 1000 ml  Output 1125 ml  Net -125 ml    Exam  Awake Alert, No new F.N deficits, generalized weakness from ALS, chronically bed and wheelchair bound, normal affect Thomas Raymond.AT,PERRAL Supple Neck,No JVD, No cervical lymphadenopathy appriciated.  Symmetrical Chest wall movement, Good air movement bilaterally, CTAB RRR,No Gallops,Rubs or new Murmurs, No Parasternal Heave +ve B.Sounds, Abd Soft, Non tender, No organomegaly appriciated, No rebound -guarding or rigidity. No Cyanosis, Clubbing or edema, No new Rash or bruise   Data Review   CBC w Diff:  Lab Results  Component Value Date   WBC 6.7 11/16/2019   HGB 12.5 (L) 11/16/2019   HCT 39.4 11/16/2019   PLT 241 11/16/2019   LYMPHOPCT 19 11/16/2019   MONOPCT 10 11/16/2019   EOSPCT 0 11/16/2019   BASOPCT 0 11/16/2019    CMP:  Lab Results  Component Value Date   NA 139 11/16/2019   K 4.7 11/16/2019   CL 102 11/16/2019   CO2 31 11/16/2019   BUN 5 (L) 11/16/2019   CREATININE 0.37 (L) 11/16/2019     CREATININE 0.33 (L) 11/11/2019   PROT 6.7 11/16/2019   ALBUMIN 2.9 (L) 11/16/2019   BILITOT 0.5 11/16/2019   ALKPHOS 247 (H) 11/16/2019   AST 141 (H) 11/16/2019   ALT 197 (H) 11/16/2019  .   Total Time in preparing paper work, data evaluation and todays exam - 35 minutes  Susa RaringPrashant Kelyn Koskela M.D on 11/16/2019 at 9:29 AM  [facility]   Office  [phone]

## 2019-11-16 NOTE — Progress Notes (Signed)
Occupational Therapy Evaluation Patient Details Name: Thomas Raymond MRN: [id number] DOB: 09-22-1945 Today's Date: 11/16/2019    History of Present Illness 73 y.o. male with history of ALS wheelchair-bound with weakness of both lower extremities presents to the ER after patient was found to have abnormal labs which were done as a routine work-up yesterday by patient's primary care physician. CT abdomen and pelvis showed multiple gallstones in the common bile duct. Plan is for ERCP 11/14/19.    Clinical Impression   Patient lives in a house with wife.  He uses a power wheelchair for mobility and has a ramp.  Spoke with daughter to get better idea of PLOF, she states patient needing some assist with transfers to/from bed and chair.  Patient slides for transfer and no longer stands.  Today patient requiring mod assist with supine <> long sit, as well as mod assist with UB ADLs and max with LB.  Daughter, wife, and patient state this is his baseline functional ability and strength.  Given nature of his diagnosis, HHPT/OT may be spared for greater declines in function.  They mentioned previously having an aid and discussed that may help.  OT will sign off acutely as patient at baseline.      Follow Up Recommendations  No OT follow up;Supervision/Assistance - 24 hour;Other (comment) (possible aid?)    Equipment Recommendations  None recommended by OT    Recommendations for Other Services       Precautions / Restrictions Precautions Precautions: Fall      Mobility Bed Mobility Overal bed mobility: Needs Assistance Bed Mobility: Supine to Sit     Supine to sit: Mod assist     General bed mobility comments: Long sit, mod assist to pull up to bring trunk up  Transfers                 General transfer comment: not assessed    Balance Overall balance assessment: Needs assistance Sitting-balance support: Bilateral upper extremity supported Sitting balance-Leahy Scale: Poor Sitting  balance - Comments: Requiring mod assist to maintain upright in long sit                                    ADL either performed or assessed with clinical judgement   ADL Overall ADL's : Needs assistance/impaired Eating/Feeding: Set up;Bed level   Grooming: Minimal assistance;Bed level   Upper Body Bathing: Moderate assistance;Bed level   Lower Body Bathing: Maximal assistance;Bed level   Upper Body Dressing : Moderate assistance;Bed level   Lower Body Dressing: Maximal assistance;Bed level   Toilet Transfer: Maximal assistance;+2 for physical assistance;BSC   Toileting- Clothing Manipulation and Hygiene: Maximal assistance;Sitting/lateral lean         General ADL Comments: Patient and family state this is his baseline     Vision Baseline Vision/History: No visual deficits Patient Visual Report: No change from baseline       Perception     Praxis      Pertinent Vitals/Pain Pain Assessment: Faces Faces Pain Scale: Hurts even more Pain Location: Left hip and back Pain Descriptors / Indicators: Discomfort Pain Intervention(s): Limited activity within patient's tolerance;Monitored during session     Hand Dominance Right   Extremity/Trunk Assessment Upper Extremity Assessment Upper Extremity Assessment: RUE deficits/detail;LUE deficits/detail RUE Deficits / Details: Very limited shoulder AROM.  Shoulder strength 2/5.  Rest of arm 3-/5. Some uncontrolled movements with hand. RUE Coordination:  decreased fine motor;decreased gross motor LUE Deficits / Details: Very limited shoulder AROM.  Shoulder strength 2/5.  Rest of arm 3+/5.  LUE Coordination: decreased fine motor;decreased gross motor   Lower Extremity Assessment Lower Extremity Assessment: Defer to PT evaluation       Communication Communication Communication: Prefers language other than [place] (daughter served as Equities trader)   Cognition Arousal/Alertness: Awake/alert Behavior During  Therapy: WFL for tasks assessed/performed Overall Cognitive Status: Within Functional Limits for tasks assessed                                 General Comments: Difficult to assess fully due to language barrier.  Able to answer most questions, follws commands well   General Comments       Exercises     Shoulder Instructions      Home Living Family/patient expects to be discharged to:: Private residence Living Arrangements: Spouse/significant other Available Help at Discharge: Family Type of Home: House Home Access: Ramped entrance     Home Layout: One level               Home Equipment: Wheelchair - power;Hospital bed   Additional Comments: Facetimed with daughter while in room and she was able to give a better PLOF and serve as interpreter.  Patient transfering self with some assist from wife.  Does not use shower, sponge bathes. Wife assists with most ADLs. Patient able to feed self        Prior Functioning/Environment Level of Independence: Needs assistance  Gait / Transfers Assistance Needed: reports no longer stands to get in/out of wheelchair. They [facility] the [facility] on home hospital bed and slide his sideways into his electric chair.  ADL's / Homemaking Assistance Needed: .            OT Problem List: Decreased strength;Decreased range of motion;Decreased activity tolerance;Impaired balance (sitting and/or standing);Decreased coordination;Pain      OT Treatment/Interventions:      OT Goals(Current goals can be found in the care plan section) Acute Rehab OT Goals Patient Stated Goal: to walk again OT Goal Formulation: With patient/family Time For Goal Achievement: 11/30/19 Potential to Achieve Goals: Good  OT Frequency:     Barriers to D/C:            Co-evaluation              AM-PAC OT "6 Clicks" Daily Activity     Outcome Measure Help from another person eating meals?: A Little Help from another person taking care of personal  grooming?: A Little Help from another person toileting, which includes using toliet, bedpan, or urinal?: A Lot Help from another person bathing (including washing, rinsing, drying)?: A Lot Help from another person to put on and taking off regular upper body clothing?: A Lot Help from another person to put on and taking off regular lower body clothing?: A Lot 6 Click Score: 14   End of Session Nurse Communication: Mobility status  Activity Tolerance: Patient tolerated treatment well Patient left: in bed;with call bell/phone within reach;with bed alarm set;with family/visitor present  OT Visit Diagnosis: Muscle weakness (generalized) (M62.81);Other abnormalities of gait and mobility (R26.89);Other symptoms and signs involving the nervous system (R29.898);Pain Pain - Right/Left: Left Pain - part of body: Hip                Time: 1120-1140 OT Time Calculation (min): 20 min Charges:  OT  General Charges $OT Visit: 1 Visit OT Evaluation $OT Eval Moderate Complexity: 1 [address] OTR/L   Adella Hare 11/16/2019, 1:12 PM

## 2019-11-16 NOTE — Discharge Instructions (Signed)
Follow with Primary MD Corwin Levins, MD in 7 days   Get CBC, CMP, 2 view Chest X ray -  checked next visit within 1 week by Primary MD   Activity: As tolerated with Full fall precautions use walker/cane & assistance as needed  Disposition Home    Diet: Heart Healthy   Special Instructions: If you have smoked or chewed Tobacco  in the last 2 yrs please stop smoking, stop any regular Alcohol  and or any Recreational drug use.  On your next visit with your primary care physician please Get Medicines reviewed and adjusted.  Please request your Prim.MD to go over all Hospital Tests and Procedure/Radiological results at the follow up, please get all Hospital records sent to your Prim MD by signing hospital release before you go home.  If you experience worsening of your admission symptoms, develop shortness of breath, life threatening emergency, suicidal or homicidal thoughts you must seek medical attention immediately by calling 911 or calling your MD immediately  if symptoms less severe.  You Must read complete instructions/literature along with all the possible adverse reactions/side effects for all the Medicines you take and that have been prescribed to you. Take any new Medicines after you have completely understood and accpet all the possible adverse reactions/side effects.        [facility] LAPAROSCOPIC SURGERY: POST OP INSTRUCTIONS Always review your discharge instruction sheet given to you by the facility where your surgery was performed. IF YOU HAVE DISABILITY OR FAMILY LEAVE FORMS, YOU MUST BRING THEM TO THE OFFICE FOR PROCESSING.   DO NOT GIVE THEM TO YOUR DOCTOR.  PAIN CONTROL  1. First take acetaminophen (Tylenol) AND/or ibuprofen (Advil) to control your pain after surgery.  Follow directions on package.  Taking acetaminophen (Tylenol) and/or ibuprofen (Advil) regularly after surgery will help to control your pain and lower the amount of prescription  pain medication you may need.  You should not take more than 3,000 mg (3 grams) of acetaminophen (Tylenol) in 24 hours.  You should not take ibuprofen (Advil), aleve, motrin, naprosyn or other NSAIDS if you have a history of stomach ulcers or chronic kidney disease.  2. A prescription for pain medication may be given to you upon discharge.  Take your pain medication as prescribed, if you still have uncontrolled pain after taking acetaminophen (Tylenol) or ibuprofen (Advil). 3. Use ice packs to help control pain. 4. If you need a refill on your pain medication, please contact your pharmacy.  They will contact our office to request authorization. Prescriptions will not be filled after 5pm or on week-ends.  HOME MEDICATIONS 5. Take your usually prescribed medications unless otherwise directed.  DIET 6. You should follow a light diet the first few days after arrival home.  Be sure to include lots of fluids daily. Avoid fatty, fried foods.   CONSTIPATION 7. It is common to experience some constipation after surgery and if you are taking pain medication.  Increasing fluid intake and taking a stool softener (such as Colace) will usually help or prevent this problem from occurring.  A mild laxative (Milk of Magnesia or Miralax) should be taken according to package instructions if there are no bowel movements after 48 hours.  WOUND/INCISION CARE 8. Most patients will experience some swelling and bruising in the area of the incisions.  Ice packs will help.  Swelling and bruising can take several days to resolve.  9. Unless discharge instructions indicate otherwise, follow guidelines below  a. STERI-STRIPS - you may remove your outer bandages 48 hours after surgery, and you may shower at that time.  You have steri-strips (small skin tapes) in place directly over the incision.  These strips should be left on the skin for 7-10 days.   b. DERMABOND/SKIN GLUE - you may shower in 24 hours.  The glue will flake off  over the next 2-3 weeks. 10. Any sutures or staples will be removed at the office during your follow-up visit.  ACTIVITIES 11. You may resume regular (light) daily activities beginning the next day--such as daily self-care, walking, climbing stairs--gradually increasing activities as tolerated.  You may have sexual intercourse when it is comfortable.  Refrain from any heavy lifting or straining until approved by your doctor. a. You may drive when you are no longer taking prescription pain medication, you can comfortably wear a seatbelt, and you can safely maneuver your car and apply brakes.  FOLLOW-UP 12. You should see your doctor in the office for a follow-up appointment approximately 2-3 weeks after your surgery.  You should have been given your post-op/follow-up appointment when your surgery was scheduled.  If you did not receive a post-op/follow-up appointment, make sure that you call for this appointment within a day or two after you arrive home to insure a convenient appointment time.   WHEN TO CALL YOUR DOCTOR: 1. Fever over 101.0 2. Inability to urinate 3. Continued bleeding from incision. 4. Increased pain, redness, or drainage from the incision. 5. Increasing abdominal pain  The clinic staff is available to answer your questions during regular business hours.  Please don't hesitate to call and ask to speak to one of the nurses for clinical concerns.  If you have a medical emergency, go to the nearest emergency room or call 911.  A surgeon from [facility] is always on call at the hospital. [address], [address], [place], [place]  [address] ? [address] [address], [place], [place]   [address] [phone] ? [phone] ? FAX [phone] Web site: [email or web address]   C?t ti m?t b?ng soi ? b?ng, Ch?m [place] sau khi lm th? thu?t Laparoscopic Cholecystectomy, Care After T? thng tin ny cung c?p cho qu v? thng tin v? cch t? ch?m [place] b?n thn sau khi lm  th? thu?t. Chuyn gia ch?m [place] s?c kh?e c?ng c th? c h??ng d?n c? th? h?n cho qu v?. Hy lin l?c v?i chuyn gia ch?m [place] s?c kh?e n?u qu v? c v?n ?? ho?c th?c m?c. Ti c th? d? ki?n ?i?u g s? x?y ra sau khi lm th? thu?t? Sau khi lm th? thu?t, qu v? th??ng b?:  ?au ? cc v?t m?. Qu v? s? ???c cho dng thu?c ?? ki?m sot c?n ?au ny.  Bu?n nn ho?c nn nh?.  Ph?ng ln v c th? ?au vai do kh gi?ng nh? khng kh ???c s? d?ng trong qu trnh lm th? thu?t. Tun th? nh?ng h??ng d?n ny ? nh: Ch?m [place] v?t m?   Tun th? ch? d?n c?a chuyn gia ch?m [place] s?c kh?e v? cch ch?m [place] v?t m?. ??m b?o qu v?: ? R?a tay b?ng x phng v n??c tr??c khi qu v? thay b?ng (b?ng). N?u khng c x phng v n??c, hy dng thu?c st trng tay. ? Thay b?ng theo ch? d?n c?a chuyn gia ch?m  s?c kh?e. ? ?? nguyn cc m?i khu (ch? ph?u thu?t), keo dn da ho?c cc d?i b?ng dnh t?i ch?.  Nh?ng th? dng ?? ?ng v?t m? trn da ny c th? c?n ?? nguyn t?i ch? trong 2 tu?n ho?c lu h?n. N?u cc mp d?i b?ng dnh b?t ??u long ra v cong ln, qu v? c th? c?t nh?ng ch? mp b? long ra ?Imagene Sheller bc h?t d?i b?ng dnh ra tr? khi chuyn gia ch?m [place] s?c kh?e b?o qu v? lm nh? v?y.  Khng t?m b?n, b?i, ho?c s? d?ng b?n t?m n??c nng cho ??n khi ???c chuyn gia ch?m [place] s?c kh?e c?a qu v? ch?p thu?n. Hy h?i chuyn gia ch?m [place] s?c kh?e xem qu v? c th? t?m vi hoa sen khng. Qu v? ch? ???c php dng mi?ng b?t bi?n ?? t?m.  Ki?m tra v?t m? m?i ngy xem c d?u hi?u nhi?m trng hay khng. Ki?m tra xem c: ? T?y ??, s?ng n?, ho?c ?au nhi?u h?n. ? Nhi?u d?ch ho?c mu h?n. ? ?m. ? M? ho?c mi hi. Ho?t ??ng  Khng li xe ho?c v?n hnh my mc h?ng n?ng trong khi dng thu?c gi?m ?au ???c k ??n.  Khng nng v?t g n?ng h?n 10 lb (4,5 kg) cho ??n khi chuyn gia ch?m [place] s?c kh?e ch?p thu?n.  Khng ch?i cc mn th? thao va ch?m cho ??n khi chuyn gia ch?m [place] s?c kh?e ch?p thu?n.  Khng li xe trong vng 24  gi? n?u qu v? ???c cho dng m?t lo?i thu?c gip th? gin (thu?c gi?m ?au an th?n).  Ngh? ng?i khi c?n. Khng tr? l?i lm vi?c ho?c ?i h?c cho ??n khi ???c chuyn gia ch?m [place] s?c kh?e c?a qu v? ch?p thu?n. H??ng d?n chung  Ch? s? d?ng thu?c khng k ??n v thu?c k ??n theo ch? d?n c?a chuyn gia ch?m [place] s?c kh?e.  ?? phng ng?a ho?c ?i?u tr? to bn trong khi qu v? ?ang dng thu?c gi?m ?au k ??n, chuyn gia ch?m  s?c kh?e c?a qu v? c th? khuy?n ngh? qu v?: ? U?ng ?? n??c ?? gi? cho n??c ti?u trong ho?c c mu vng nh?t. ? Dng thu?c khng k ??n ho?c thu?c k ??n. ? ?n th?c ?n giu ch?t x? nh? tri cy t??i v rau, ng? c?c nguyn h?t v cc lo?i ??u. ? H?n ch? cc lo?i th?c ?n giu ch?t bo v ???ng tinh luy?n, ch?ng h?n nh? ?? ?n chin rn v ?? ng?t. Hy lin l?c v?i chuyn gia ch?m  s?c kh?e n?u:  Qu v? b? pht ban.  Qu v? b? t?y ??, s?ng n?, ho?c ?au nhi?u h?n xung quanh v?t m?.  Qu v? b? ch?y nhi?u d?ch ho?c mu h?n ? v?t m?.  C?m th?y nng khi s? vo v?t m? c?a qu v?.  Qu v? c m? ho?c mi kh ch?u ? v?t m?.  Qu v? b? s?t.  M?t ho?c nhi?u v?t m? c?a qu v? h mi?ng. Yu c?u tr? gip ngay l?p t?c n?u:  Qu v? b? kh th?.  Qu v? b? ?au ng?c.  Qu v? b? ?au t?ng ln ? vai.  Qu v? c?m th?y chng m?t ho?c ng?t x?u khi ??ng.  Qu v? b? ?au r?t nhi?u ? b?ng.  Qu v? bu?n nn ho?c nn ko di h?n m?t ngy.  Qu v? b? ?au chn. Thng tin ny khng nh?m m?c ?ch thay th? cho l?i khuyn m chuyn gia ch?m  s?c kh?e ni v?i qu v?. Hy b?o ??m qu v? ph?i th?o lu?n b?t  k? v?n ?? g m qu v? c v?i chuyn gia ch?m [place] s?c kh?e c?a qu v?. Document Revised: 07/25/2016 Document Reviewed: 09/25/2015 Elsevier Patient Education  2020 [employer]

## 2019-11-17 ENCOUNTER — Telehealth: Payer: Self-pay | Admitting: *Deleted

## 2019-11-17 NOTE — Telephone Encounter (Signed)
Called pt to make hosp f/u appt. Pt was d/c on yesterday 7/27, and on d/c summary was told to f/u w/PCP in 1-2 weeks. Pt states he has to find a ride first... he does not drive. He states once he find a ride he will call back to schedule appt.Marland KitchenRaechel Chute

## 2019-12-02 ENCOUNTER — Inpatient Hospital Stay: Payer: Medicare HMO | Admitting: Internal Medicine

## 2020-01-17 ENCOUNTER — Other Ambulatory Visit: Payer: Self-pay | Admitting: Internal Medicine

## 2020-04-10 ENCOUNTER — Other Ambulatory Visit: Payer: Self-pay | Admitting: Internal Medicine

## 2020-04-10 NOTE — Telephone Encounter (Signed)
Please refill as per office routine med refill policy (all routine meds refilled for 3 mo or monthly per pt preference up to one year from last visit, then month to month grace period for 3 mo, then further med refills will have to be denied)  

## 2020-05-03 ENCOUNTER — Other Ambulatory Visit: Payer: Self-pay | Admitting: Internal Medicine

## 2020-07-08 ENCOUNTER — Other Ambulatory Visit: Payer: Self-pay | Admitting: Internal Medicine

## 2020-07-08 NOTE — Telephone Encounter (Signed)
Please refill as per office routine med refill policy (all routine meds refilled for 3 mo or monthly per pt preference up to one year from last visit, then month to month grace period for 3 mo, then further med refills will have to be denied)  

## 2020-08-26 ENCOUNTER — Other Ambulatory Visit: Payer: Self-pay | Admitting: Internal Medicine

## 2020-08-26 NOTE — Telephone Encounter (Signed)
Please refill as per office routine med refill policy (all routine meds refilled for 3 mo or monthly per pt preference up to one year from last visit, then month to month grace period for 3 mo, then further med refills will have to be denied)  

## 2020-10-04 ENCOUNTER — Other Ambulatory Visit: Payer: Self-pay | Admitting: Internal Medicine

## 2020-10-04 NOTE — Telephone Encounter (Signed)
Please refill as per office routine med refill policy (all routine meds refilled for 3 mo or monthly per pt preference up to one year from last visit, then month to month grace period for 3 mo, then further med refills will have to be denied)  

## 2020-10-05 ENCOUNTER — Other Ambulatory Visit: Payer: Self-pay | Admitting: Internal Medicine

## 2020-10-05 NOTE — Telephone Encounter (Signed)
Please refill as per office routine med refill policy (all routine meds refilled for 3 mo or monthly per pt preference up to one year from last visit, then month to month grace period for 3 mo, then further med refills will have to be denied)  

## 2020-10-09 ENCOUNTER — Other Ambulatory Visit: Payer: Self-pay

## 2020-10-09 ENCOUNTER — Other Ambulatory Visit: Payer: Self-pay | Admitting: Internal Medicine

## 2020-10-09 MED ORDER — AMLODIPINE BESYLATE 10 MG PO TABS: 10.0000 mg | ORAL_TABLET | Freq: Every day | ORAL | 0 refills | Status: DC

## 2020-10-13 ENCOUNTER — Encounter: Payer: Self-pay | Admitting: Internal Medicine

## 2020-10-13 ENCOUNTER — Ambulatory Visit (INDEPENDENT_AMBULATORY_CARE_PROVIDER_SITE_OTHER): Payer: Medicare HMO | Admitting: Internal Medicine

## 2020-10-13 ENCOUNTER — Other Ambulatory Visit: Payer: Self-pay

## 2020-10-13 VITALS — BP 130/80 | HR 62 | Temp 98.2°F | Resp 18 | Ht 61.0 in

## 2020-10-13 DIAGNOSIS — Z0001 Encounter for general adult medical examination with abnormal findings: Secondary | ICD-10-CM | POA: Diagnosis not present

## 2020-10-13 DIAGNOSIS — E7800 Pure hypercholesterolemia, unspecified: Secondary | ICD-10-CM

## 2020-10-13 DIAGNOSIS — R7302 Impaired glucose tolerance (oral): Secondary | ICD-10-CM

## 2020-10-13 DIAGNOSIS — M5432 Sciatica, left side: Secondary | ICD-10-CM

## 2020-10-13 DIAGNOSIS — I10 Essential (primary) hypertension: Secondary | ICD-10-CM | POA: Diagnosis not present

## 2020-10-13 DIAGNOSIS — E538 Deficiency of other specified B group vitamins: Secondary | ICD-10-CM

## 2020-10-13 DIAGNOSIS — E559 Vitamin D deficiency, unspecified: Secondary | ICD-10-CM | POA: Diagnosis not present

## 2020-10-13 DIAGNOSIS — Z0000 Encounter for general adult medical examination without abnormal findings: Secondary | ICD-10-CM | POA: Diagnosis not present

## 2020-10-13 LAB — CBC WITH DIFFERENTIAL/PLATELET
Basophils Absolute: 0.1 10*3/uL (ref 0.0–0.1)
Basophils Relative: 0.6 % (ref 0.0–3.0)
Eosinophils Absolute: 0.2 10*3/uL (ref 0.0–0.7)
Eosinophils Relative: 1.9 % (ref 0.0–5.0)
HCT: 42.3 % (ref 39.0–52.0)
Hemoglobin: 14 g/dL (ref 13.0–17.0)
Lymphocytes Relative: 10.2 % — ABNORMAL LOW (ref 12.0–46.0)
Lymphs Abs: 1.2 10*3/uL (ref 0.7–4.0)
MCHC: 33.2 g/dL (ref 30.0–36.0)
MCV: 88.5 fl (ref 78.0–100.0)
Monocytes Absolute: 0.8 10*3/uL (ref 0.1–1.0)
Monocytes Relative: 7.1 % (ref 3.0–12.0)
Neutro Abs: 9.3 10*3/uL — ABNORMAL HIGH (ref 1.4–7.7)
Neutrophils Relative %: 80.2 % — ABNORMAL HIGH (ref 43.0–77.0)
Platelets: 247 10*3/uL (ref 150.0–400.0)
RBC: 4.78 Mil/uL (ref 4.22–5.81)
RDW: 14 % (ref 11.5–15.5)
WBC: 11.6 10*3/uL — ABNORMAL HIGH (ref 4.0–10.5)

## 2020-10-13 LAB — PSA: PSA: 0.29 ng/mL (ref 0.10–4.00)

## 2020-10-13 LAB — URINALYSIS, ROUTINE W REFLEX MICROSCOPIC
Bilirubin Urine: NEGATIVE
Hgb urine dipstick: NEGATIVE
Ketones, ur: NEGATIVE
Leukocytes,Ua: NEGATIVE
Nitrite: NEGATIVE
RBC / HPF: NONE SEEN (ref 0–?)
Specific Gravity, Urine: 1.015 (ref 1.000–1.030)
Total Protein, Urine: NEGATIVE
Urine Glucose: NEGATIVE
Urobilinogen, UA: 0.2 (ref 0.0–1.0)
pH: 7 (ref 5.0–8.0)

## 2020-10-13 LAB — HEPATIC FUNCTION PANEL
ALT: 34 U/L (ref 0–53)
AST: 28 U/L (ref 0–37)
Albumin: 4.3 g/dL (ref 3.5–5.2)
Alkaline Phosphatase: 71 U/L (ref 39–117)
Bilirubin, Direct: 0 mg/dL (ref 0.0–0.3)
Total Bilirubin: 0.3 mg/dL (ref 0.2–1.2)
Total Protein: 8 g/dL (ref 6.0–8.3)

## 2020-10-13 LAB — LIPID PANEL
Cholesterol: 131 mg/dL (ref 0–200)
HDL: 59.5 mg/dL (ref >39.00)
LDL Cholesterol: 60 mg/dL (ref 0–99)
NonHDL: 71.12
Total CHOL/HDL Ratio: 2
Triglycerides: 57 mg/dL (ref 0.0–149.0)
VLDL: 11.4 mg/dL (ref 0.0–40.0)

## 2020-10-13 LAB — BASIC METABOLIC PANEL
BUN: 11 mg/dL (ref 6–23)
CO2: 31 mEq/L (ref 19–32)
Calcium: 9.6 mg/dL (ref 8.4–10.5)
Chloride: 103 mEq/L (ref 96–112)
Creatinine, Ser: 0.28 mg/dL — ABNORMAL LOW (ref 0.40–1.50)
GFR: 118.49 mL/min (ref >60.00)
Glucose, Bld: 91 mg/dL (ref 70–99)
Potassium: 4.6 mEq/L (ref 3.5–5.1)
Sodium: 140 mEq/L (ref 135–145)

## 2020-10-13 LAB — HEMOGLOBIN A1C: Hgb A1c MFr Bld: 6.1 % (ref 4.6–6.5)

## 2020-10-13 LAB — VITAMIN B12: Vitamin B-12: 546 pg/mL (ref 211–911)

## 2020-10-13 LAB — VITAMIN D 25 HYDROXY (VIT D DEFICIENCY, FRACTURES): VITD: 30.02 ng/mL (ref 30.00–100.00)

## 2020-10-13 LAB — TSH: TSH: 1.52 u[IU]/mL (ref 0.35–4.50)

## 2020-10-13 MED ORDER — METOPROLOL SUCCINATE ER 50 MG PO TB24: 50.0000 mg | ORAL_TABLET | Freq: Every day | ORAL | 3 refills | Status: DC

## 2020-10-13 MED ORDER — GABAPENTIN 100 MG PO CAPS: 100.0000 mg | ORAL_CAPSULE | Freq: Three times a day (TID) | ORAL | 1 refills | Status: DC

## 2020-10-13 MED ORDER — ROSUVASTATIN CALCIUM 20 MG PO TABS: 20.0000 mg | ORAL_TABLET | Freq: Every day | ORAL | 3 refills | Status: DC

## 2020-10-13 MED ORDER — AMLODIPINE BESYLATE 10 MG PO TABS: 10.0000 mg | ORAL_TABLET | Freq: Every day | ORAL | 3 refills | Status: DC

## 2020-10-13 MED ORDER — OMEPRAZOLE 20 MG PO CPDR: 20.0000 mg | DELAYED_RELEASE_CAPSULE | Freq: Every day | ORAL | 3 refills | Status: DC

## 2020-10-13 NOTE — Assessment & Plan Note (Signed)
Age and sex appropriate education and counseling updated with regular exercise and diet Referrals for preventative services - declines colonosocpy Immunizations addressed - declines covid, shingrix Smoking counseling  - none needed Evidence for depression or other mood disorder - none significant Most recent labs reviewed. I have personally reviewed and have noted: 1) the patient's medical and social history 2) The patient's current medications and supplements 3) The patient's height, weight, and BMI have been recorded in the chart

## 2020-10-13 NOTE — Assessment & Plan Note (Signed)
BP Readings from Last 3 Encounters:  10/13/20 130/80  11/16/19 (!) 149/76  11/11/19 (!) 120/60   Stable, pt to continue medical treatment norvasc, toprol

## 2020-10-13 NOTE — Assessment & Plan Note (Signed)
With recent worsening, for gabapentin 100 tid, consider ortho if not improved

## 2020-10-13 NOTE — Assessment & Plan Note (Signed)
Lab Results  Component Value Date   LDLCALC 60 10/13/2020   Stable, pt to continue current statin crestor 20

## 2020-10-13 NOTE — Progress Notes (Signed)
Patient ID: Thomas Raymond, male   DOB: 01-15-1946, 75 y.o.   MRN: [id number]         Chief Complaint:: wellness exam and Annual Exam (He would like medication for pain in his legs. )        HPI:  Thomas Raymond is a 75 y.o. male here for wellness exam; declines covid booster #2, shingrix, colonoscopy ow up to date with preventive referrals and immunizations.                        Also Pt continues to have recurring LBP without change in severity, bowel or bladder change, fever, wt loss, gait change or falls, but has had worsening left buttock and LLE neuritic pain to below the knee.Pt denies chest pain, increased sob or doe, wheezing, orthopnea, PND, increased LE swelling, palpitations, dizziness or syncope.   Pt denies polydipsia, polyuria, or new focal neuro s/s.   Pt denies fever, wt loss, night sweats, loss of appetite, or other constitutional symptoms  Denies worsening depressive symptoms, suicidal ideation, or panic    Wt Readings from Last 3 Encounters:  11/15/19 140 lb 3.4 oz (63.6 kg)  09/19/16 151 lb (68.5 kg)  05/30/16 155 lb (70.3 kg)   BP Readings from Last 3 Encounters:  10/13/20 130/80  11/16/19 (!) 149/76  11/11/19 (!) 120/60   Immunization History  Administered Date(s) Administered   Influenza Split 01/21/2011, 02/07/2012   Influenza Whole 01/30/2006, 02/05/2008, 01/26/2009   Influenza, High Dose Seasonal PF 05/30/2016, 05/16/2017, 04/28/2018   Influenza,inj,Quad PF,6+ Mos 02/23/2013, 05/19/2014   Moderna SARS-COV2 Booster Vaccination 01/24/2020   Moderna Sars-Covid-2 Vaccination 07/04/2019, 08/01/2019   Pneumococcal Conjugate-13 03/09/2013   Pneumococcal Polysaccharide-23 01/30/2006, 02/07/2012   Td 03/30/2008   Tdap 10/05/2012   There are no preventive care reminders to display for this patient.     Past Medical History:  Diagnosis Date   ALLERGIC RHINITIS    ALS    HYPERLIPIDEMIA    HYPERTENSION    Impaired glucose tolerance 02/17/2011   LOW BACK PAIN    VERTIGO     Past Surgical History:  Procedure Laterality Date   CHOLECYSTECTOMY N/A 11/15/2019   Procedure: LAPAROSCOPIC CHOLECYSTECTOMY;  Surgeon: Abigail Miyamoto, MD;  Location: [facility];  Service: General;  Laterality: N/A;   COMPRESSION HIP SCREW Left 02/07/2014   Procedure: IM Nail;  Surgeon: Harvie Junior, MD;  Location: [facility];  Service: Orthopedics;  Laterality: Left;   ERCP N/A 11/14/2019   Procedure: ENDOSCOPIC RETROGRADE CHOLANGIOPANCREATOGRAPHY (ERCP);  Surgeon: Hilarie Fredrickson, MD;  Location[facility] ENDOSCOPY;  Service: Endoscopy;  Laterality: N/A;   IR GENERIC HISTORICAL  11/21/2015   IR RADIOLOGIST EVAL & MGMT 11/21/2015 Oley Balm, MD [facility] INTERV RAD   Lipoma removal  2003   Percutaneous gastrostomy tube repalcement     04/1999 and removed 07/1999   REMOVAL OF STONES  11/14/2019   Procedure: REMOVAL OF STONES;  Surgeon: Hilarie Fredrickson, MD;  Location: [facility] ENDOSCOPY;  Service: Endoscopy;;   SPHINCTEROTOMY  11/14/2019   Procedure: Dennison Mascot;  Surgeon: Hilarie Fredrickson, MD;  Location: [facility] ENDOSCOPY;  Service: Endoscopy;;   TRACHEOSTOMY      reports that he has never smoked. He has never used smokeless tobacco. He reports that he does not drink alcohol and does not use drugs. family history includes Other in his father and mother; Other (age of onset: 2) in his daughter and son. No Known Allergies Current Outpatient Medications  on File Prior to Visit  Medication Sig Dispense Refill   acetaminophen (TYLENOL) 325 MG tablet Take 2 tablets (650 mg total) by mouth every 6 (six) hours as needed for mild pain or moderate pain (or temp > 100). 20 tablet 0   cetirizine (ZYRTEC) 10 MG tablet Take 1 tablet (10 mg total) by mouth daily as needed. 90 tablet 1   No current facility-administered medications on file prior to visit.        ROS:  All others reviewed and negative.  Objective        PE:  BP 130/80   Pulse 62   Temp 98.2 F (36.8 C) (Oral)   Resp 18   Ht 5\' 1"  (1.549 m)   SpO2 96%   BMI  26.49 kg/m                 Constitutional: Pt appears in NAD               HENT: Head: NCAT.                Right Ear: External ear normal.                 Left Ear: External ear normal.                Eyes: . Pupils are equal, round, and reactive to light. Conjunctivae and EOM are normal               Nose: without d/c or deformity               Neck: Neck supple. Gross normal ROM               Cardiovascular: Normal rate and regular rhythm.                 Pulmonary/Chest: Effort normal and breath sounds without rales or wheezing.                Abd:  Soft, NT, ND, + BS, no organomegaly               Neurological: Pt is alert. At baseline orientation,wheelchair bound               Skin: Skin is warm. No rashes, no other new lesions, LE edema -2+ chronic dependent edena               Psychiatric: Pt behavior is normal without agitation   Micro: none  Cardiac tracings I have personally interpreted today:  none  Pertinent Radiological findings (summarize): none   Lab Results  Component Value Date   WBC 11.6 (H) 10/13/2020   HGB 14.0 10/13/2020   HCT 42.3 10/13/2020   PLT 247.0 10/13/2020   GLUCOSE 91 10/13/2020   CHOL 131 10/13/2020   TRIG 57.0 10/13/2020   HDL 59.50 10/13/2020   LDLDIRECT 126.4 09/22/2006   LDLCALC 60 10/13/2020   ALT 34 10/13/2020   AST 28 10/13/2020   NA 140 10/13/2020   K 4.6 10/13/2020   CL 103 10/13/2020   CREATININE 0.28 (L) 10/13/2020   BUN 11 10/13/2020   CO2 31 10/13/2020   TSH 1.52 10/13/2020   PSA 0.29 10/13/2020   INR 0.9 11/13/2019   HGBA1C 6.1 10/13/2020   Assessment/Plan:  Thomas Raymond is a 75 y.o. Asian [4] male with  has a past medical history of ALLERGIC RHINITIS, ALS, HYPERLIPIDEMIA, HYPERTENSION, Impaired glucose tolerance (02/17/2011), LOW BACK PAIN,  and VERTIGO.  Encounter for well adult exam with abnormal findings Age and sex appropriate education and counseling updated with regular exercise and diet Referrals for preventative  services - declines colonosocpy Immunizations addressed - declines covid, shingrix Smoking counseling  - none needed Evidence for depression or other mood disorder - none significant Most recent labs reviewed. I have personally reviewed and have noted: 1) the patient's medical and social history 2) The patient's current medications and supplements 3) The patient's height, weight, and BMI have been recorded in the chart   Essential hypertension BP Readings from Last 3 Encounters:  10/13/20 130/80  11/16/19 (!) 149/76  11/11/19 (!) 120/60   Stable, pt to continue medical treatment norvasc, toprol   Impaired glucose tolerance Lab Results  Component Value Date   HGBA1C 6.1 10/13/2020   Stable, pt to continue current medical treatment  - diet   Hyperlipidemia Lab Results  Component Value Date   LDLCALC 60 10/13/2020   Stable, pt to continue current statin crestor 20   Left sided sciatica With recent worsening, for gabapentin 100 tid, consider ortho if not improved  Followup: Return in about 6 months (around 04/14/2021).  Oliver Barre, MD 10/13/2020 9:26 PM [facility] Internal Medicine

## 2020-10-13 NOTE — Assessment & Plan Note (Signed)
Lab Results  Component Value Date   HGBA1C 6.1 10/13/2020   Stable, pt to continue current medical treatment  - diet

## 2020-10-13 NOTE — Patient Instructions (Addendum)
Please continue all other medications as before, and refills have been done if requested. ° °Please have the pharmacy call with any other refills you may need. ° °Please continue your efforts at being more active, low cholesterol diet, and weight control. ° °You are otherwise up to date with prevention measures today. ° °Please keep your appointments with your specialists as you may have planned ° °Please go to the LAB at the blood drawing area for the tests to be done ° °You will be contacted by phone if any changes need to be made immediately.  Otherwise, you will receive a letter about your results with an explanation, but please check with MyChart first. ° °Please remember to sign up for MyChart if you have not done so, as this will be important to you in the future with finding out test results, communicating by private email, and scheduling acute appointments online when needed. ° °Please make an Appointment to return in 6 months, or sooner if needed, °

## 2020-10-29 ENCOUNTER — Other Ambulatory Visit: Payer: Self-pay | Admitting: Internal Medicine

## 2020-12-12 ENCOUNTER — Other Ambulatory Visit: Payer: Self-pay | Admitting: Internal Medicine

## 2020-12-12 NOTE — Telephone Encounter (Signed)
Please refill as per office routine med refill policy (all routine meds refilled for 3 mo or monthly per pt preference up to one year from last visit, then month to month grace period for 3 mo, then further med refills will have to be denied)  

## 2020-12-22 ENCOUNTER — Other Ambulatory Visit: Payer: Self-pay | Admitting: Internal Medicine

## 2020-12-22 NOTE — Telephone Encounter (Signed)
Please refill as per office routine med refill policy (all routine meds to be refilled for 3 mo or monthly (per pt preference) up to one year from last visit, then month to month grace period for 3 mo, then further med refills will have to be denied) ? ?

## 2021-02-02 ENCOUNTER — Other Ambulatory Visit: Payer: Self-pay | Admitting: Internal Medicine

## 2021-02-07 ENCOUNTER — Ambulatory Visit: Payer: Medicare HMO | Admitting: Internal Medicine

## 2021-03-08 ENCOUNTER — Other Ambulatory Visit: Payer: Self-pay | Admitting: Internal Medicine

## 2021-04-24 ENCOUNTER — Other Ambulatory Visit: Payer: Self-pay | Admitting: Internal Medicine

## 2021-06-21 ENCOUNTER — Other Ambulatory Visit: Payer: Self-pay | Admitting: Internal Medicine

## 2021-06-21 NOTE — Telephone Encounter (Signed)
Please refill as per office routine med refill policy (all routine meds to be refilled for 3 mo or monthly (per pt preference) up to one year from last visit, then month to month grace period for 3 mo, then further med refills will have to be denied) ? ?

## 2021-06-28 ENCOUNTER — Ambulatory Visit: Payer: Medicare HMO | Admitting: Internal Medicine

## 2021-08-18 ENCOUNTER — Other Ambulatory Visit: Payer: Self-pay | Admitting: Internal Medicine

## 2021-08-18 NOTE — Telephone Encounter (Signed)
Please refill as per office routine med refill policy (all routine meds to be refilled for 3 mo or monthly (per pt preference) up to one year from last visit, then month to month grace period for 3 mo, then further med refills will have to be denied) ? ?

## 2021-08-19 ENCOUNTER — Other Ambulatory Visit: Payer: Self-pay | Admitting: Internal Medicine

## 2021-08-19 NOTE — Telephone Encounter (Signed)
Please refill as per office routine med refill policy (all routine meds to be refilled for 3 mo or monthly (per pt preference) up to one year from last visit, then month to month grace period for 3 mo, then further med refills will have to be denied) ? ?

## 2021-09-12 ENCOUNTER — Other Ambulatory Visit: Payer: Self-pay | Admitting: Internal Medicine

## 2021-09-12 NOTE — Telephone Encounter (Signed)
Please refill as per office routine med refill policy (all routine meds to be refilled for 3 mo or monthly (per pt preference) up to one year from last visit, then month to month grace period for 3 mo, then further med refills will have to be denied) ? ?

## 2021-09-14 ENCOUNTER — Encounter: Payer: Self-pay | Admitting: Internal Medicine

## 2021-09-14 ENCOUNTER — Ambulatory Visit (INDEPENDENT_AMBULATORY_CARE_PROVIDER_SITE_OTHER): Payer: Medicare HMO | Admitting: Internal Medicine

## 2021-09-14 VITALS — BP 136/84 | HR 73 | Temp 97.9°F | Ht 61.0 in

## 2021-09-14 DIAGNOSIS — G1220 Motor neuron disease, unspecified: Secondary | ICD-10-CM

## 2021-09-14 DIAGNOSIS — E7800 Pure hypercholesterolemia, unspecified: Secondary | ICD-10-CM | POA: Diagnosis not present

## 2021-09-14 DIAGNOSIS — E538 Deficiency of other specified B group vitamins: Secondary | ICD-10-CM

## 2021-09-14 DIAGNOSIS — R7302 Impaired glucose tolerance (oral): Secondary | ICD-10-CM | POA: Diagnosis not present

## 2021-09-14 DIAGNOSIS — E559 Vitamin D deficiency, unspecified: Secondary | ICD-10-CM | POA: Diagnosis not present

## 2021-09-14 DIAGNOSIS — Z125 Encounter for screening for malignant neoplasm of prostate: Secondary | ICD-10-CM | POA: Diagnosis not present

## 2021-09-14 DIAGNOSIS — I10 Essential (primary) hypertension: Secondary | ICD-10-CM

## 2021-09-14 DIAGNOSIS — Z0001 Encounter for general adult medical examination with abnormal findings: Secondary | ICD-10-CM | POA: Diagnosis not present

## 2021-09-14 DIAGNOSIS — R609 Edema, unspecified: Secondary | ICD-10-CM

## 2021-09-14 LAB — HEPATIC FUNCTION PANEL
ALT: 26 U/L (ref 0–53)
AST: 26 U/L (ref 0–37)
Albumin: 4.3 g/dL (ref 3.5–5.2)
Alkaline Phosphatase: 67 U/L (ref 39–117)
Bilirubin, Direct: 0.1 mg/dL (ref 0.0–0.3)
Total Bilirubin: 0.4 mg/dL (ref 0.2–1.2)
Total Protein: 8 g/dL (ref 6.0–8.3)

## 2021-09-14 LAB — BASIC METABOLIC PANEL
BUN: 19 mg/dL (ref 6–23)
CO2: 28 mEq/L (ref 19–32)
Calcium: 9.8 mg/dL (ref 8.4–10.5)
Chloride: 105 mEq/L (ref 96–112)
Creatinine, Ser: 0.36 mg/dL — ABNORMAL LOW (ref 0.40–1.50)
GFR: 109.12 mL/min (ref >60.00)
Glucose, Bld: 69 mg/dL — ABNORMAL LOW (ref 70–99)
Potassium: 4.3 mEq/L (ref 3.5–5.1)
Sodium: 144 mEq/L (ref 135–145)

## 2021-09-14 LAB — URINALYSIS, ROUTINE W REFLEX MICROSCOPIC
Bilirubin Urine: NEGATIVE
Ketones, ur: NEGATIVE
Leukocytes,Ua: NEGATIVE
Nitrite: NEGATIVE
RBC / HPF: NONE SEEN (ref 0–?)
Specific Gravity, Urine: 1.02 (ref 1.000–1.030)
Total Protein, Urine: 100 — AB
Urine Glucose: NEGATIVE
Urobilinogen, UA: 0.2 (ref 0.0–1.0)
pH: 6 (ref 5.0–8.0)

## 2021-09-14 LAB — LIPID PANEL
Cholesterol: 138 mg/dL (ref 0–200)
HDL: 57.1 mg/dL (ref >39.00)
LDL Cholesterol: 63 mg/dL (ref 0–99)
NonHDL: 81.18
Total CHOL/HDL Ratio: 2
Triglycerides: 90 mg/dL (ref 0.0–149.0)
VLDL: 18 mg/dL (ref 0.0–40.0)

## 2021-09-14 LAB — CBC WITH DIFFERENTIAL/PLATELET
Basophils Absolute: 0.1 10*3/uL (ref 0.0–0.1)
Basophils Relative: 0.8 % (ref 0.0–3.0)
Eosinophils Absolute: 0.1 10*3/uL (ref 0.0–0.7)
Eosinophils Relative: 1.6 % (ref 0.0–5.0)
HCT: 44.4 % (ref 39.0–52.0)
Hemoglobin: 14.7 g/dL (ref 13.0–17.0)
Lymphocytes Relative: 20.5 % (ref 12.0–46.0)
Lymphs Abs: 1.6 10*3/uL (ref 0.7–4.0)
MCHC: 33 g/dL (ref 30.0–36.0)
MCV: 90.3 fl (ref 78.0–100.0)
Monocytes Absolute: 0.8 10*3/uL (ref 0.1–1.0)
Monocytes Relative: 10.6 % (ref 3.0–12.0)
Neutro Abs: 5.1 10*3/uL (ref 1.4–7.7)
Neutrophils Relative %: 66.5 % (ref 43.0–77.0)
Platelets: 259 10*3/uL (ref 150.0–400.0)
RBC: 4.92 Mil/uL (ref 4.22–5.81)
RDW: 13.7 % (ref 11.5–15.5)
WBC: 7.7 10*3/uL (ref 4.0–10.5)

## 2021-09-14 LAB — VITAMIN B12: Vitamin B-12: 436 pg/mL (ref 211–911)

## 2021-09-14 LAB — PSA: PSA: 0.29 ng/mL (ref 0.10–4.00)

## 2021-09-14 LAB — HEMOGLOBIN A1C: Hgb A1c MFr Bld: 6 % (ref 4.6–6.5)

## 2021-09-14 LAB — TSH: TSH: 1.07 u[IU]/mL (ref 0.35–5.50)

## 2021-09-14 LAB — VITAMIN D 25 HYDROXY (VIT D DEFICIENCY, FRACTURES): VITD: 39.82 ng/mL (ref 30.00–100.00)

## 2021-09-14 MED ORDER — AMLODIPINE BESYLATE 10 MG PO TABS: 10.0000 mg | ORAL_TABLET | Freq: Every day | ORAL | 3 refills | Status: DC

## 2021-09-14 MED ORDER — MECLIZINE HCL 12.5 MG PO TABS: ORAL_TABLET | ORAL | 3 refills | Status: DC

## 2021-09-14 MED ORDER — CETIRIZINE HCL 10 MG PO TABS: 10.0000 mg | ORAL_TABLET | Freq: Every day | ORAL | 3 refills | Status: DC | PRN

## 2021-09-14 MED ORDER — METOPROLOL SUCCINATE ER 50 MG PO TB24: 50.0000 mg | ORAL_TABLET | Freq: Every day | ORAL | 3 refills | Status: DC

## 2021-09-14 MED ORDER — OMEPRAZOLE 20 MG PO CPDR: 20.0000 mg | DELAYED_RELEASE_CAPSULE | Freq: Every day | ORAL | 3 refills | Status: DC

## 2021-09-14 MED ORDER — GABAPENTIN 300 MG PO CAPS: 300.0000 mg | ORAL_CAPSULE | Freq: Three times a day (TID) | ORAL | 3 refills | Status: DC

## 2021-09-14 MED ORDER — CETIRIZINE HCL 10 MG PO TABS
10.0000 mg | ORAL_TABLET | Freq: Every day | ORAL | 3 refills | Status: DC | PRN
Start: 2021-09-14 — End: 2022-04-12

## 2021-09-14 MED ORDER — ROSUVASTATIN CALCIUM 20 MG PO TABS: 20.0000 mg | ORAL_TABLET | Freq: Every day | ORAL | 3 refills | Status: DC

## 2021-09-14 MED ORDER — HYDROCHLOROTHIAZIDE 12.5 MG PO CAPS: 12.5000 mg | ORAL_CAPSULE | Freq: Every day | ORAL | 3 refills | Status: DC

## 2021-09-14 NOTE — Patient Instructions (Addendum)
You will be contacted regarding the referral for: neurology  Please take all new medication as prescribed - the hct 12.5 mg per day fluid pill for the swelling in the legs  Please continue all other medications as before, and refills have been done if requested.  Please have the pharmacy call with any other refills you may need.  Please continue your efforts at being more active, low cholesterol diet, and weight control.  You are otherwise up to date with prevention measures today.  Please keep your appointments with your specialists as you may have planned  Please go to the LAB at the blood drawing area for the tests to be done - on the first floor  You will be contacted by phone if any changes need to be made immediately.  Otherwise, you will receive a letter about your results with an explanation, but please check with MyChart first.  Please remember to sign up for MyChart if you have not done so, as this will be important to you in the future with finding out test results, communicating by private email, and scheduling acute appointments online when needed.  Please make an Appointment to return in 6 months, or sooner if needed

## 2021-09-16 ENCOUNTER — Encounter: Payer: Self-pay | Admitting: Internal Medicine

## 2021-09-16 DIAGNOSIS — R609 Edema, unspecified: Secondary | ICD-10-CM | POA: Insufficient documentation

## 2021-09-16 DIAGNOSIS — R600 Localized edema: Secondary | ICD-10-CM | POA: Insufficient documentation

## 2021-09-16 NOTE — Assessment & Plan Note (Signed)
Also for neurology referral - ALS

## 2021-09-16 NOTE — Assessment & Plan Note (Signed)

## 2021-09-16 NOTE — Assessment & Plan Note (Signed)
Most likely related to dependent edema, for add hct 12.5 qd

## 2021-09-16 NOTE — Assessment & Plan Note (Signed)
BP Readings from Last 3 Encounters:  09/14/21 136/84  10/13/20 130/80  11/16/19 (!) 149/76   Stable, pt to continue medical treatment norvasc, hct, toprol

## 2021-09-16 NOTE — Progress Notes (Signed)
Patient ID: Thomas Raymond, male   DOB: [birth year], 76 y.o.   MRN: [id number]         Chief Complaint:: wellness exam and Leg Swelling (Bil leg swelling) , htn, als, hyperglycemia, hld       HPI:  Thomas Raymond is a 76 y.o. male here for wellness exam; decliens covid booster, shingrix o/w up to date                        Also overall doing about the same, except for mild worsening bilateral dependent edema to legs.  Pt denies chest pain, increased sob or doe, wheezing, orthopnea, PND, palpitations, dizziness or syncope.  Pt denies polydipsia, polyuria, or new focal neuro s/s.    Pt denies fever, wt loss, night sweats, loss of appetite, or other constitutional symptoms  Has not f/u with neurology recently.     Wt Readings from Last 3 Encounters:  11/15/19 140 lb 3.4 oz (63.6 kg)  09/19/16 151 lb (68.5 kg)  05/30/16 155 lb (70.3 kg)   BP Readings from Last 3 Encounters:  09/14/21 136/84  10/13/20 130/80  11/16/19 (!) 149/76   Immunization History  Administered Date(s) Administered   Influenza Split 01/21/2011, 02/07/2012   Influenza Whole 01/30/2006, 02/05/2008, 01/26/2009   Influenza, High Dose Seasonal PF 05/30/2016, 05/16/2017, 04/28/2018   Influenza,inj,Quad PF,6+ Mos 02/23/2013, 05/19/2014   Moderna SARS-COV2 Booster Vaccination 01/24/2020   Moderna Sars-Covid-2 Vaccination 07/04/2019, 08/01/2019   Pneumococcal Conjugate-13 03/09/2013   Pneumococcal Polysaccharide-23 01/30/2006, 02/07/2012   Td 03/30/2008   Tdap 10/05/2012  There are no preventive care reminders to display for this patient.    Past Medical History:  Diagnosis Date   ALLERGIC RHINITIS    ALS    HYPERLIPIDEMIA    HYPERTENSION    Impaired glucose tolerance 02/17/2011   LOW BACK PAIN    VERTIGO    Past Surgical History:  Procedure Laterality Date   CHOLECYSTECTOMY N/A 11/15/2019   Procedure: LAPAROSCOPIC CHOLECYSTECTOMY;  Surgeon: Coralie Keens, MD;  Location: [place];  Service: General;  Laterality: N/A;    COMPRESSION HIP SCREW Left 02/07/2014   Procedure: IM Nail;  Surgeon: Alta Corning, MD;  Location: [facility];  Service: Orthopedics;  Laterality: Left;   ERCP N/A 11/14/2019   Procedure: ENDOSCOPIC RETROGRADE CHOLANGIOPANCREATOGRAPHY (ERCP);  Surgeon: Irene Shipper, MD;  Location: [facility] ENDOSCOPY;  Service: Endoscopy;  Laterality: N/A;   IR GENERIC HISTORICAL  11/21/2015   IR RADIOLOGIST EVAL & MGMT 11/21/2015 Arne Cleveland, MD [facility] INTERV RAD   Lipoma removal  2003   Percutaneous gastrostomy tube repalcement     04/1999 and removed 07/1999   REMOVAL OF STONES  11/14/2019   Procedure: REMOVAL OF STONES;  Surgeon: Irene Shipper, MD;  Location: [facility] ENDOSCOPY;  Service: Endoscopy;;   SPHINCTEROTOMY  11/14/2019   Procedure: Joan Mayans;  Surgeon: Irene Shipper, MD;  Location: [place];  Service: Endoscopy;;   TRACHEOSTOMY      reports that he has never smoked. He has never used smokeless tobacco. He reports that he does not drink alcohol and does not use drugs. family history includes Other in his father and mother; Other (age of onset: 2) in his daughter and son. No Known Allergies Current Outpatient Medications on File Prior to Visit  Medication Sig Dispense Refill   acetaminophen (TYLENOL) 325 MG tablet Take 2 tablets (650 mg total) by mouth every 6 (six) hours as needed for mild pain or moderate  pain (or temp > 100). 20 tablet 0   No current facility-administered medications on file prior to visit.        ROS:  All others reviewed and negative.  Objective        PE:  BP 136/84   Pulse 73   Temp 97.9 F (36.6 C) (Oral)   Ht 5\' 1"  (1.549 m)   SpO2 98%   BMI 26.49 kg/m                 Constitutional: Pt appears in NAD               HENT: Head: NCAT.                Right Ear: External ear normal.                 Left Ear: External ear normal.                Eyes: . Pupils are equal, round, and reactive to light. Conjunctivae and EOM are normal               Nose: without d/c or  deformity               Neck: Neck supple. Gross normal ROM               Cardiovascular: Normal rate and regular rhythm.                 Pulmonary/Chest: Effort normal and breath sounds without rales or wheezing.                Abd:  Soft, NT, ND, + BS, no organomegaly               Neurological: Pt is alert. At baseline orientation, motor no change               Skin: Skin is warm. No rashes, no other new lesions, LE edema - trace bialteral to knees               Psychiatric: Pt behavior is normal without agitation   Micro: none  Cardiac tracings I have personally interpreted today:  none  Pertinent Radiological findings (summarize): none   Lab Results  Component Value Date   WBC 7.7 09/14/2021   HGB 14.7 09/14/2021   HCT 44.4 09/14/2021   PLT 259.0 09/14/2021   GLUCOSE 69 (L) 09/14/2021   CHOL 138 09/14/2021   TRIG 90.0 09/14/2021   HDL 57.10 09/14/2021   LDLDIRECT 126.4 09/22/2006   LDLCALC 63 09/14/2021   ALT 26 09/14/2021   AST 26 09/14/2021   NA 144 09/14/2021   K 4.3 09/14/2021   CL 105 09/14/2021   CREATININE 0.36 (L) 09/14/2021   BUN 19 09/14/2021   CO2 28 09/14/2021   TSH 1.07 09/14/2021   PSA 0.29 09/14/2021   INR 0.9 11/13/2019   HGBA1C 6.0 09/14/2021   Assessment/Plan:  [name] is a 76 y.o. Asian [4] male with  has a past medical history of ALLERGIC RHINITIS, ALS, HYPERLIPIDEMIA, HYPERTENSION, Impaired glucose tolerance (02/17/2011), LOW BACK PAIN, and VERTIGO.  Encounter for well adult exam with abnormal findings Age and sex appropriate education and counseling updated with regular exercise and diet Referrals for preventative services - none needed Immunizations addressed - declines covid booster, shingrix Smoking counseling  - none needed Evidence for depression or other mood disorder - none significant Most recent labs reviewed.  I have personally reviewed and have noted: 1) the patient's medical and social history 2) The patient's current  medications and supplements 3) The patient's height, weight, and BMI have been recorded in the chart   Motor neuron disease [facility]) Also for neurology referral - ALS  Impaired glucose tolerance Lab Results  Component Value Date   HGBA1C 6.0 09/14/2021   Stable, pt to continue current medical treatment  - diet   Hyperlipidemia Lab Results  Component Value Date   [place] 63 09/14/2021   Stable, pt to continue current statin crestor 20   Essential hypertension BP Readings from Last 3 Encounters:  09/14/21 136/84  10/13/20 130/80  11/16/19 (!) 149/76   Stable, pt to continue medical treatment norvasc, hct, toprol   Peripheral edema Most likely related to dependent edema, for add hct 12.5 qd  Followup: Return in about 6 months (around 03/17/2022).  Cathlean Cower, MD 09/16/2021 4:27 PM [facility]

## 2021-09-16 NOTE — Assessment & Plan Note (Signed)
Lab Results  Component Value Date   LDLCALC 63 09/14/2021   Stable, pt to continue current statin crestor 20

## 2021-09-16 NOTE — Assessment & Plan Note (Signed)
Lab Results  Component Value Date   HGBA1C 6.0 09/14/2021   Stable, pt to continue current medical treatment  - diet

## 2021-09-19 ENCOUNTER — Telehealth: Payer: Self-pay | Admitting: *Deleted

## 2021-09-19 NOTE — Telephone Encounter (Signed)
Open by mistake. Mail;ed out pt labs work this am../lmb

## 2021-09-19 NOTE — Progress Notes (Signed)
Printed and mail to pt per MD request../lmb

## 2022-01-24 ENCOUNTER — Ambulatory Visit (INDEPENDENT_AMBULATORY_CARE_PROVIDER_SITE_OTHER): Payer: Medicare HMO | Admitting: Internal Medicine

## 2022-01-24 ENCOUNTER — Encounter: Payer: Self-pay | Admitting: Internal Medicine

## 2022-01-24 VITALS — BP 128/72 | HR 73 | Temp 98.7°F | Ht 61.0 in

## 2022-01-24 DIAGNOSIS — J309 Allergic rhinitis, unspecified: Secondary | ICD-10-CM | POA: Diagnosis not present

## 2022-01-24 DIAGNOSIS — R059 Cough, unspecified: Secondary | ICD-10-CM | POA: Diagnosis not present

## 2022-01-24 DIAGNOSIS — E559 Vitamin D deficiency, unspecified: Secondary | ICD-10-CM

## 2022-01-24 MED ORDER — HYDROCODONE BIT-HOMATROP MBR 5-1.5 MG/5ML PO SOLN: 5.0000 mL | Freq: Four times a day (QID) | ORAL | 0 refills | Status: AC | PRN

## 2022-01-24 MED ORDER — TRIAMCINOLONE ACETONIDE 55 MCG/ACT NA AERO: 2.0000 | INHALATION_SPRAY | Freq: Every day | NASAL | 12 refills | Status: AC

## 2022-01-24 NOTE — Patient Instructions (Signed)
You had the flu shot today  Please take all new medication as prescribed -the nasacort for allergies  Please continue all other medications as before, and refills have been done if requested - the cough medicine  Please have the pharmacy call with any other refills you may need.  Please continue your efforts at being more active, low cholesterol diet, and weight control.  Please keep your appointments with your specialists as you may have planned  Please make an Appointment to return in 6 months, or sooner if needed

## 2022-01-24 NOTE — Progress Notes (Signed)
Patient ID: Thomas Raymond, male   DOB: 04-06-1946, 76 y.o.   MRN: [id number]        Chief Complaint: follow up cough, allergies, low vit d, HLD       HPI:  76 Thomas Raymond is a 76 y.o. male here overall doing well, Pt denies chest pain, increased sob or doe, wheezing, orthopnea, PND, increased LE swelling, palpitations, dizziness or syncope,  Denies fever, but has 2 wks of nightly cough worst at 1 am with lying down, wakes him up, cant get back to sleep, feels terrible tired the next day.   Pt denies polydipsia, polyuria, or new focal neuro s/s.   Not taking Vit D  Taking zyrtec for allergies, trying to follow lower chol diet.   Good med compliance,  Due for flu shot.  Declines shingrix to next visit       Wt Readings from Last 3 Encounters:  11/15/19 140 lb 3.4 oz (63.6 kg)  09/19/16 151 lb (68.5 kg)  05/30/16 155 lb (70.3 kg)   BP Readings from Last 3 Encounters:  01/24/22 128/72  09/14/21 136/84  10/13/20 130/80         Past Medical History:  Diagnosis Date   ALLERGIC RHINITIS    ALS    HYPERLIPIDEMIA    HYPERTENSION    Impaired glucose tolerance 02/17/2011   LOW BACK PAIN    VERTIGO    Past Surgical History:  Procedure Laterality Date   CHOLECYSTECTOMY N/A 11/15/2019   Procedure: LAPAROSCOPIC CHOLECYSTECTOMY;  Surgeon: Coralie Keens, MD;  Location: [place];  Service: General;  Laterality: N/A;   COMPRESSION HIP SCREW Left 02/07/2014   Procedure: IM Nail;  Surgeon: Alta Corning, MD;  Location: [facility];  Service: Orthopedics;  Laterality: Left;   ERCP N/A 11/14/2019   Procedure: ENDOSCOPIC RETROGRADE CHOLANGIOPANCREATOGRAPHY (ERCP);  Surgeon: Irene Shipper, MD;  Location: [facility] ENDOSCOPY;  Service: Endoscopy;  Laterality: N/A;   IR GENERIC HISTORICAL  11/21/2015   IR RADIOLOGIST EVAL & MGMT 11/21/2015 Arne Cleveland, MD [facility] INTERV RAD   Lipoma removal  2003   Percutaneous gastrostomy tube repalcement     04/1999 and removed 07/1999   REMOVAL OF STONES  11/14/2019   Procedure: REMOVAL OF  STONES;  Surgeon: Irene Shipper, MD;  Location: [facility] ENDOSCOPY;  Service: Endoscopy;;   SPHINCTEROTOMY  11/14/2019   Procedure: Joan Mayans;  Surgeon: Irene Shipper, MD;  Location: [place];  Service: Endoscopy;;   TRACHEOSTOMY      reports that he has never smoked. He has never used smokeless tobacco. He reports that he does not drink alcohol and does not use drugs. family history includes Other in his father and mother; Other (age of onset: 2) in his daughter and son. No Known Allergies Current Outpatient Medications on File Prior to Visit  Medication Sig Dispense Refill   acetaminophen (TYLENOL) 325 MG tablet Take 2 tablets (650 mg total) by mouth every 6 (six) hours as needed for mild pain or moderate pain (or temp > 100). 20 tablet 0   amLODipine (NORVASC) 10 MG tablet Take 1 tablet (10 mg total) by mouth daily. take 1 tablet (10 mg total) by mouth daily. 90 tablet 3   cetirizine (ZYRTEC) 10 MG tablet Take 1 tablet (10 mg total) by mouth daily as needed. 90 tablet 3   gabapentin (NEURONTIN) 300 MG capsule Take 1 capsule (300 mg total) by mouth 3 (three) times daily. 270 capsule 3   hydrochlorothiazide (MICROZIDE) 12.5 MG capsule  Take 1 capsule (12.5 mg total) by mouth daily. 90 capsule 3   meclizine (ANTIVERT) 12.5 MG tablet TAKE 1 TABLET BY MOUTH 3 TIMES A DAY AS NEEDED FOR DIZZINESS 60 tablet 3   metoprolol succinate (TOPROL-XL) 50 MG 24 hr tablet Take 1 tablet (50 mg total) by mouth daily. TAKE WITH OR IMMEDIATELY FOLLOWING A MEAL. 90 tablet 3   omeprazole (PRILOSEC) 20 MG capsule Take 1 capsule (20 mg total) by mouth daily. Must keep 09/14/21 appt for future refills 90 capsule 3   rosuvastatin (CRESTOR) 20 MG tablet Take 1 tablet (20 mg total) by mouth daily. 90 tablet 3   No current facility-administered medications on file prior to visit.        ROS:  All others reviewed and negative.  Objective        PE:  BP 128/72 (BP Location: Right Arm, Patient Position: Sitting, Cuff  Size: Normal)   Pulse 73   Temp 98.7 F (37.1 C) (Oral)   Ht 5\' 1"  (1.549 m)   SpO2 98%   BMI 26.49 kg/m                 Constitutional: Pt appears in NAD               HENT: Head: NCAT.                Right Ear: External ear normal.                 Left Ear: External ear normal.  Bilat tm's with mild erythema.  Max sinus areas non tender.  Pharynx with mild erythema, no exudate               Eyes: . Pupils are equal, round, and reactive to light. Conjunctivae and EOM are normal               Nose: without d/c or deformity               Neck: Neck supple. Gross normal ROM               Cardiovascular: Normal rate and regular rhythm.                 Pulmonary/Chest: Effort normal and breath sounds without rales or wheezing.                Abd:  Soft, NT, ND, + BS, no organomegaly               Neurological: Pt is alert. At baseline orientation, motor grossly intact               Skin: Skin is warm. No rashes, no other new lesions, LE edema - chronic trace to 1+ dependent swelling               Psychiatric: Pt behavior is normal without agitation   Micro: none  Cardiac tracings I have personally interpreted today:  none  Pertinent Radiological findings (summarize): none   Lab Results  Component Value Date   WBC 7.7 09/14/2021   HGB 14.7 09/14/2021   HCT 44.4 09/14/2021   PLT 259.0 09/14/2021   GLUCOSE 69 (L) 09/14/2021   CHOL 138 09/14/2021   TRIG 90.0 09/14/2021   HDL 57.10 09/14/2021   LDLDIRECT 126.4 09/22/2006   LDLCALC 63 09/14/2021   ALT 26 09/14/2021   AST 26 09/14/2021   NA 144 09/14/2021   K 4.3 09/14/2021  CL 105 09/14/2021   CREATININE 0.36 (L) 09/14/2021   BUN 19 09/14/2021   CO2 28 09/14/2021   TSH 1.07 09/14/2021   PSA 0.29 09/14/2021   INR 0.9 11/13/2019   HGBA1C 6.0 09/14/2021   Assessment/Plan:  Thomas Raymond is a 76 y.o. Asian [4] male with  has a past medical history of ALLERGIC RHINITIS, ALS, HYPERLIPIDEMIA, HYPERTENSION, Impaired glucose  tolerance (02/17/2011), LOW BACK PAIN, and VERTIGO.  Allergic rhinitis Mild to mod, for add nasacort asd  Cont zyrtec 10 mg qd,  to f/u any worsening symptoms or concerns   Cough Only occurs with lying down at 1 AM but cant get back to sleep - for cough prn for now, tx allergies as above, declines cxr today  Vitamin D deficiency Last vitamin D Lab Results  Component Value Date   VD25OH 39.82 09/14/2021   Low, to start oral replacement  Followup: Return in about 6 months (around 07/26/2022).  Oliver Barre, MD 01/24/2022 1:27 PM [facility]

## 2022-01-24 NOTE — Assessment & Plan Note (Signed)
Mild to mod, for add nasacort asd  Cont zyrtec 10 mg qd,  to f/u any worsening symptoms or concerns

## 2022-01-24 NOTE — Assessment & Plan Note (Signed)
Only occurs with lying down at 1 AM but cant get back to sleep - for cough prn for now, tx allergies as above, declines cxr today

## 2022-01-24 NOTE — Assessment & Plan Note (Signed)
Last vitamin D Lab Results  Component Value Date   VD25OH 39.82 09/14/2021   Low, to start oral replacement

## 2022-04-07 ENCOUNTER — Emergency Department (HOSPITAL_COMMUNITY): Payer: Medicare HMO

## 2022-04-07 ENCOUNTER — Inpatient Hospital Stay (HOSPITAL_COMMUNITY)
Admission: EM | Admit: 2022-04-07 | Discharge: 2022-04-12 | DRG: 177 | Disposition: A | Discharge status: Home / Self Care | Payer: Medicare HMO | Attending: Family Medicine | Admitting: Family Medicine

## 2022-04-07 DIAGNOSIS — R059 Cough, unspecified: Secondary | ICD-10-CM | POA: Diagnosis not present

## 2022-04-07 DIAGNOSIS — B9689 Other specified bacterial agents as the cause of diseases classified elsewhere: Secondary | ICD-10-CM | POA: Diagnosis present

## 2022-04-07 DIAGNOSIS — R531 Weakness: Secondary | ICD-10-CM

## 2022-04-07 DIAGNOSIS — J811 Chronic pulmonary edema: Secondary | ICD-10-CM | POA: Diagnosis not present

## 2022-04-07 DIAGNOSIS — Z1152 Encounter for screening for COVID-19: Secondary | ICD-10-CM

## 2022-04-07 DIAGNOSIS — Z936 Other artificial openings of urinary tract status: Secondary | ICD-10-CM | POA: Diagnosis not present

## 2022-04-07 DIAGNOSIS — I10 Essential (primary) hypertension: Secondary | ICD-10-CM | POA: Diagnosis not present

## 2022-04-07 DIAGNOSIS — Z993 Dependence on wheelchair: Secondary | ICD-10-CM | POA: Diagnosis not present

## 2022-04-07 DIAGNOSIS — G1221 Amyotrophic lateral sclerosis: Secondary | ICD-10-CM | POA: Diagnosis present

## 2022-04-07 DIAGNOSIS — J439 Emphysema, unspecified: Secondary | ICD-10-CM | POA: Diagnosis not present

## 2022-04-07 DIAGNOSIS — R1310 Dysphagia, unspecified: Secondary | ICD-10-CM | POA: Diagnosis not present

## 2022-04-07 DIAGNOSIS — R0602 Shortness of breath: Secondary | ICD-10-CM | POA: Diagnosis not present

## 2022-04-07 DIAGNOSIS — R54 Age-related physical debility: Secondary | ICD-10-CM | POA: Diagnosis present

## 2022-04-07 DIAGNOSIS — A419 Sepsis, unspecified organism: Secondary | ICD-10-CM | POA: Diagnosis not present

## 2022-04-07 DIAGNOSIS — J9601 Acute respiratory failure with hypoxia: Secondary | ICD-10-CM | POA: Diagnosis present

## 2022-04-07 DIAGNOSIS — J189 Pneumonia, unspecified organism: Secondary | ICD-10-CM | POA: Diagnosis present

## 2022-04-07 DIAGNOSIS — E876 Hypokalemia: Secondary | ICD-10-CM | POA: Diagnosis not present

## 2022-04-07 DIAGNOSIS — Z7401 Bed confinement status: Secondary | ICD-10-CM

## 2022-04-07 DIAGNOSIS — R918 Other nonspecific abnormal finding of lung field: Secondary | ICD-10-CM | POA: Diagnosis not present

## 2022-04-07 DIAGNOSIS — R051 Acute cough: Secondary | ICD-10-CM

## 2022-04-07 DIAGNOSIS — J9811 Atelectasis: Secondary | ICD-10-CM | POA: Diagnosis not present

## 2022-04-07 DIAGNOSIS — R231 Pallor: Secondary | ICD-10-CM | POA: Diagnosis present

## 2022-04-07 DIAGNOSIS — J9819 Other pulmonary collapse: Secondary | ICD-10-CM | POA: Diagnosis not present

## 2022-04-07 DIAGNOSIS — T17800A Unspecified foreign body in other parts of respiratory tract causing asphyxiation, initial encounter: Secondary | ICD-10-CM | POA: Diagnosis not present

## 2022-04-07 DIAGNOSIS — E785 Hyperlipidemia, unspecified: Secondary | ICD-10-CM | POA: Diagnosis present

## 2022-04-07 DIAGNOSIS — Z515 Encounter for palliative care: Secondary | ICD-10-CM | POA: Diagnosis not present

## 2022-04-07 DIAGNOSIS — R0902 Hypoxemia: Principal | ICD-10-CM

## 2022-04-07 DIAGNOSIS — J168 Pneumonia due to other specified infectious organisms: Secondary | ICD-10-CM | POA: Diagnosis not present

## 2022-04-07 DIAGNOSIS — Z79899 Other long term (current) drug therapy: Secondary | ICD-10-CM | POA: Diagnosis not present

## 2022-04-07 DIAGNOSIS — B3789 Other sites of candidiasis: Secondary | ICD-10-CM | POA: Diagnosis present

## 2022-04-07 DIAGNOSIS — R509 Fever, unspecified: Secondary | ICD-10-CM | POA: Diagnosis not present

## 2022-04-07 DIAGNOSIS — J690 Pneumonitis due to inhalation of food and vomit: Principal | ICD-10-CM | POA: Diagnosis present

## 2022-04-07 DIAGNOSIS — J90 Pleural effusion, not elsewhere classified: Secondary | ICD-10-CM | POA: Diagnosis not present

## 2022-04-07 DIAGNOSIS — Z4659 Encounter for fitting and adjustment of other gastrointestinal appliance and device: Secondary | ICD-10-CM | POA: Diagnosis not present

## 2022-04-07 DIAGNOSIS — T17800D Unspecified foreign body in other parts of respiratory tract causing asphyxiation, subsequent encounter: Secondary | ICD-10-CM | POA: Diagnosis not present

## 2022-04-07 LAB — URINALYSIS, ROUTINE W REFLEX MICROSCOPIC
Bacteria, UA: NONE SEEN
Bilirubin Urine: NEGATIVE
Glucose, UA: NEGATIVE mg/dL
Ketones, ur: 20 mg/dL — AB
Nitrite: NEGATIVE
Protein, ur: 100 mg/dL — AB
Specific Gravity, Urine: 1.021 (ref 1.005–1.030)
pH: 5 (ref 5.0–8.0)

## 2022-04-07 LAB — RESP PANEL BY RT-PCR (RSV, FLU A&B, COVID)  RVPGX2
Influenza A by PCR: NEGATIVE
Influenza B by PCR: NEGATIVE
Resp Syncytial Virus by PCR: POSITIVE — AB
SARS Coronavirus 2 by RT PCR: NEGATIVE

## 2022-04-07 LAB — CBC
HCT: 46.2 % (ref 39.0–52.0)
Hemoglobin: 15.2 g/dL (ref 13.0–17.0)
MCH: 29.6 pg (ref 26.0–34.0)
MCHC: 32.9 g/dL (ref 30.0–36.0)
MCV: 90.1 fL (ref 80.0–100.0)
Platelets: 321 10*3/uL (ref 150–400)
RBC: 5.13 MIL/uL (ref 4.22–5.81)
RDW: 13.1 % (ref 11.5–15.5)
WBC: 13 10*3/uL — ABNORMAL HIGH (ref 4.0–10.5)
nRBC: 0 % (ref 0.0–0.2)

## 2022-04-07 LAB — MAGNESIUM: Magnesium: 2.2 mg/dL (ref 1.7–2.4)

## 2022-04-07 LAB — TSH: TSH: 0.365 u[IU]/mL (ref 0.350–4.500)

## 2022-04-07 LAB — MRSA NEXT GEN BY PCR, NASAL: MRSA by PCR Next Gen: NOT DETECTED

## 2022-04-07 LAB — PHOSPHORUS: Phosphorus: 3.3 mg/dL (ref 2.5–4.6)

## 2022-04-07 LAB — BASIC METABOLIC PANEL
Anion gap: 16 — ABNORMAL HIGH (ref 5–15)
BUN: 14 mg/dL (ref 8–23)
CO2: 37 mmol/L — ABNORMAL HIGH (ref 22–32)
Calcium: 9 mg/dL (ref 8.9–10.3)
Chloride: 84 mmol/L — ABNORMAL LOW (ref 98–111)
Creatinine, Ser: 0.58 mg/dL — ABNORMAL LOW (ref 0.61–1.24)
GFR, Estimated: >60 mL/min (ref >60)
Glucose, Bld: 105 mg/dL — ABNORMAL HIGH (ref 70–99)
Potassium: 2.2 mmol/L — CL (ref 3.5–5.1)
Sodium: 137 mmol/L (ref 135–145)

## 2022-04-07 LAB — HEPATIC FUNCTION PANEL
ALT: 17 U/L (ref 0–44)
AST: 28 U/L (ref 15–41)
Albumin: 2.9 g/dL — ABNORMAL LOW (ref 3.5–5.0)
Alkaline Phosphatase: 84 U/L (ref 38–126)
Bilirubin, Direct: 0.3 mg/dL — ABNORMAL HIGH (ref 0.0–0.2)
Indirect Bilirubin: 1.1 mg/dL — ABNORMAL HIGH (ref 0.3–0.9)
Total Bilirubin: 1.4 mg/dL — ABNORMAL HIGH (ref 0.3–1.2)
Total Protein: 8 g/dL (ref 6.5–8.1)

## 2022-04-07 LAB — PROCALCITONIN: Procalcitonin: 0.44 ng/mL

## 2022-04-07 LAB — LACTIC ACID, PLASMA
Lactic Acid, Venous: 1.1 mmol/L (ref 0.5–1.9)
Lactic Acid, Venous: 1.2 mmol/L (ref 0.5–1.9)

## 2022-04-07 LAB — TROPONIN I (HIGH SENSITIVITY)
Troponin I (High Sensitivity): 27 ng/L — ABNORMAL HIGH (ref <18)
Troponin I (High Sensitivity): 28 ng/L — ABNORMAL HIGH (ref <18)

## 2022-04-07 LAB — BRAIN NATRIURETIC PEPTIDE: B Natriuretic Peptide: 79.3 pg/mL (ref 0.0–100.0)

## 2022-04-07 MED ORDER — SODIUM CHLORIDE 0.9 % IV SOLN: 500.0000 mg | Freq: Once | INTRAVENOUS | Status: DC

## 2022-04-07 MED ORDER — POTASSIUM CHLORIDE CRYS ER 20 MEQ PO TBCR
40.0000 meq | EXTENDED_RELEASE_TABLET | Freq: Once | ORAL | Status: AC
  Administered 2022-04-07: 40 meq via ORAL
  Filled 2022-04-07: qty 2

## 2022-04-07 MED ORDER — PIPERACILLIN-TAZOBACTAM 3.375 G IVPB
3.3750 g | Freq: Three times a day (TID) | INTRAVENOUS | Status: DC
  Administered 2022-04-07 – 2022-04-12 (×14): 3.375 g via INTRAVENOUS
  Filled 2022-04-07 (×14): qty 50

## 2022-04-07 MED ORDER — POTASSIUM CHLORIDE 10 MEQ/100ML IV SOLN
10.0000 meq | INTRAVENOUS | Status: AC
  Administered 2022-04-07 (×2): 10 meq via INTRAVENOUS
  Filled 2022-04-07 (×3): qty 100

## 2022-04-07 MED ORDER — IOHEXOL 300 MG/ML  SOLN
100.0000 mL | Freq: Once | INTRAMUSCULAR | Status: AC | PRN
  Administered 2022-04-07: 100 mL via INTRAVENOUS

## 2022-04-07 MED ORDER — ROSUVASTATIN CALCIUM 20 MG PO TABS
20.0000 mg | ORAL_TABLET | Freq: Every day | ORAL | Status: DC
  Administered 2022-04-08 – 2022-04-09 (×2): 20 mg via ORAL
  Filled 2022-04-07 (×2): qty 1

## 2022-04-07 MED ORDER — VANCOMYCIN HCL 1250 MG/250ML IV SOLN
1250.0000 mg | INTRAVENOUS | Status: DC
  Administered 2022-04-07: 1250 mg via INTRAVENOUS
  Filled 2022-04-07: qty 250

## 2022-04-07 MED ORDER — SODIUM CHLORIDE 0.9 % IV SOLN: 1.0000 g | Freq: Once | INTRAVENOUS | Status: DC

## 2022-04-07 MED ORDER — POLYETHYLENE GLYCOL 3350 17 G PO PACK: 17.0000 g | PACK | Freq: Every day | ORAL | Status: DC | PRN

## 2022-04-07 MED ORDER — SODIUM CHLORIDE 3 % IN NEBU
4.0000 mL | INHALATION_SOLUTION | Freq: Two times a day (BID) | RESPIRATORY_TRACT | Status: AC
  Administered 2022-04-07 – 2022-04-10 (×6): 4 mL via RESPIRATORY_TRACT
  Filled 2022-04-07 (×6): qty 4

## 2022-04-07 MED ORDER — GABAPENTIN 300 MG PO CAPS
300.0000 mg | ORAL_CAPSULE | Freq: Three times a day (TID) | ORAL | Status: DC
  Administered 2022-04-07 – 2022-04-10 (×6): 300 mg via ORAL
  Filled 2022-04-07 (×6): qty 1

## 2022-04-07 MED ORDER — ENOXAPARIN SODIUM 40 MG/0.4ML IJ SOSY
40.0000 mg | PREFILLED_SYRINGE | INTRAMUSCULAR | Status: DC
  Administered 2022-04-07 – 2022-04-11 (×4): 40 mg via SUBCUTANEOUS
  Filled 2022-04-07 (×5): qty 0.4

## 2022-04-07 MED ORDER — PANTOPRAZOLE SODIUM 40 MG PO TBEC
40.0000 mg | DELAYED_RELEASE_TABLET | Freq: Every day | ORAL | Status: DC
  Administered 2022-04-07 – 2022-04-09 (×3): 40 mg via ORAL
  Filled 2022-04-07 (×3): qty 1

## 2022-04-07 MED ORDER — LACTATED RINGERS IV SOLN: INTRAVENOUS | Status: DC

## 2022-04-07 MED ORDER — SODIUM CHLORIDE 0.9 % IV SOLN: INTRAVENOUS | Status: DC

## 2022-04-07 MED ORDER — METRONIDAZOLE 500 MG/100ML IV SOLN: 500.0000 mg | Freq: Once | INTRAVENOUS | Status: DC

## 2022-04-07 NOTE — H&P (Signed)
History and Physical    Justice Addition Goldner [id number] DOB: 1945/09/26 DOA: 04/07/2022  PCP: Biagio Borg, MD   Patient coming from: Home  Chief Complaint: Weakness, cough  HPI: Thomas Raymond is a [place] speaking 76 y.o. male with medical history significant of hypertension, ALS with wheelchair-bound /bedbound status presented from home with complaints of cough which started about a week ago.  Cough has been dry, associated  with chest pain.  Also complained of shortness of breath.  He has history of ALS and is usually wheelchair/bedbound.  Feeling of more weak since last 1 to 2 weeks. Unable to eat.  He was usually able to eat by himself while sitting on the bed when he was good . He was seen by his PCP about a week ago and was given cough medications but his symptoms have not improved.  Also reported subjective feeling of fever, chills at home.  Reported loss of appetite and has not eaten anything over the last 2 days. On presentation, he was hemodynamically stable but was hypoxic with saturation of 86% on room air and was placed on 2 L of nasal cannula.  Lab work showed potassium of 2.2.  Chest x-ray showed right-sided pneumonia with opacification of the bronchus, right lower lobe, right middle lobe collapse.  Given a dose of Ceftriaxone, azithromycin, Flagyl. Wife was at bedside. Patient denies any abdominal pain, nausea, vomiting, diarrhea, hematochezia or melena or loss of consciousness.  ED Course: Vitals were  stable.  Afebrile.  Did not look in respiratory distress.  Labs with potassium of 2.2, mildly elevated anion gap, WBC count of 13.  Chest ray showed right basilar opacity, possible pleural effusion.  CT chest abdomen/pelvis with contrast showed  opacification of the bronchus intermedius and right lower lobe bronchus with complete collapse of the right lower lobe.Right middle lobe collapse with air bronchograms.Possibility of mucous plugging or aspiration,emphysematous changes in the  lungs. PCCM consulted.  Started on broad spectrum antibiotics, blood culture sent.  Review of Systems: As per HPI otherwise 10 point review of systems negative.    Past Medical History:  Diagnosis Date   ALLERGIC RHINITIS    ALS    HYPERLIPIDEMIA    HYPERTENSION    Impaired glucose tolerance 02/17/2011   LOW BACK PAIN    VERTIGO     Past Surgical History:  Procedure Laterality Date   CHOLECYSTECTOMY N/A 11/15/2019   Procedure: LAPAROSCOPIC CHOLECYSTECTOMY;  Surgeon: Coralie Keens, MD;  Location: [place];  Service: General;  Laterality: N/A;   COMPRESSION HIP SCREW Left 02/07/2014   Procedure: IM Nail;  Surgeon: Alta Corning, MD;  Location: [facility];  Service: Orthopedics;  Laterality: Left;   ERCP N/A 11/14/2019   Procedure: ENDOSCOPIC RETROGRADE CHOLANGIOPANCREATOGRAPHY (ERCP);  Surgeon: Irene Shipper, MD;  Location[facility] ENDOSCOPY;  Service: Endoscopy;  Laterality: N/A;   IR GENERIC HISTORICAL  11/21/2015   IR RADIOLOGIST EVAL & MGMT 11/21/2015 Arne Cleveland, MD [facility] INTERV RAD   Lipoma removal  2003   Percutaneous gastrostomy tube repalcement     04/1999 and removed 07/1999   REMOVAL OF STONES  11/14/2019   Procedure: REMOVAL OF STONES;  Surgeon: Irene Shipper, MD;  Location[facility] ENDOSCOPY;  Service: Endoscopy;;   SPHINCTEROTOMY  11/14/2019   Procedure: Joan Mayans;  Surgeon: Irene Shipper, MD;  Location: [place];  Service: Endoscopy;;   TRACHEOSTOMY       reports that he has never smoked. He has never used smokeless tobacco. He reports  that he does not drink alcohol and does not use drugs.  No Known Allergies  Family History  Problem Relation Age of Onset   Other Mother        Deceased   Other Father        Deceased   Other Son 2       x 2 starvation/illness from [place]   Other Daughter 2       x 2 starvation/illness from refugee [place]     Prior to Admission medications   Medication Sig Start Date End Date Taking? Authorizing Provider  acetaminophen  (TYLENOL) 325 MG tablet Take 2 tablets (650 mg total) by mouth every 6 (six) hours as needed for mild pain or moderate pain (or temp > 100). 11/16/19   Thurnell Lose, MD  amLODipine (NORVASC) 10 MG tablet Take 1 tablet (10 mg total) by mouth daily. take 1 tablet (10 mg total) by mouth daily. 09/14/21   Biagio Borg, MD  cetirizine (ZYRTEC) 10 MG tablet Take 1 tablet (10 mg total) by mouth daily as needed. 09/14/21   Biagio Borg, MD  gabapentin (NEURONTIN) 300 MG capsule Take 1 capsule (300 mg total) by mouth 3 (three) times daily. 09/14/21   Biagio Borg, MD  hydrochlorothiazide (MICROZIDE) 12.5 MG capsule Take 1 capsule (12.5 mg total) by mouth daily. 09/14/21   Biagio Borg, MD  meclizine (ANTIVERT) 12.5 MG tablet TAKE 1 TABLET BY MOUTH 3 TIMES A DAY AS NEEDED FOR DIZZINESS 09/14/21   Biagio Borg, MD  metoprolol succinate (TOPROL-XL) 50 MG 24 hr tablet Take 1 tablet (50 mg total) by mouth daily. TAKE WITH OR IMMEDIATELY FOLLOWING A MEAL. 09/14/21   Biagio Borg, MD  omeprazole (PRILOSEC) 20 MG capsule Take 1 capsule (20 mg total) by mouth daily. Must keep 09/14/21 appt for future refills 09/14/21   Biagio Borg, MD  rosuvastatin (CRESTOR) 20 MG tablet Take 1 tablet (20 mg total) by mouth daily. 09/14/21   Biagio Borg, MD  triamcinolone (NASACORT) 55 MCG/ACT AERO nasal inhaler Place 2 sprays into the nose daily. 01/24/22   Biagio Borg, MD    Physical Exam: Vitals:   04/07/22 1147 04/07/22 1148 04/07/22 1300 04/07/22 1444  BP:   134/73   Pulse:   82   Resp:   20   Temp:    99.9 F (37.7 C)  TempSrc:    Oral  SpO2: (!) 88% 94% 94%     Constitutional: Overall comfortable, lying in bed, chronically ill looking Vitals:   04/07/22 1147 04/07/22 1148 04/07/22 1300 04/07/22 1444  BP:   134/73   Pulse:   82   Resp:   20   Temp:    99.9 F (37.7 C)  TempSrc:    Oral  SpO2: (!) 88% 94% 94%    Eyes: PERRL, lids and conjunctivae normal ENMT: Mucous membranes are moist.  Neck: normal,  supple, no masses, no thyromegaly Respiratory: Diminished air sounds on the right side, crackles/rhonchi on the right side.normal respiratory effort. No accessory muscle use.  Cardiovascular: Regular rate and rhythm, no murmurs / rubs / gallops. No extremity edema.  Abdomen: no tenderness, no masses palpated. No hepatosplenomegaly. Bowel sounds positive.  Musculoskeletal: no clubbing / cyanosis. No joint deformity upper and lower extremities.  Skin: no rashes, lesions, ulcers. No induration Neurologic: CN 2-12 grossly intact.  Generalized weakness Psychiatric: Normal judgment and insight. Alert and oriented x 3. Normal mood.  Foley Catheter:None  Labs on Admission: I have personally reviewed following labs and imaging studies  CBC: Recent Labs  Lab 04/07/22 1039  WBC 13.0*  HGB 15.2  HCT 46.2  MCV 90.1  PLT [id number]   Basic Metabolic Panel: Recent Labs  Lab 04/07/22 1039  NA 137  K 2.2*  CL 84*  CO2 37*  GLUCOSE 105*  BUN 14  CREATININE 0.58*  CALCIUM 9.0   GFR: CrCl cannot be calculated (Unknown ideal weight.). Liver Function Tests: No results for input(s): "AST", "ALT", "ALKPHOS", "BILITOT", "PROT", "ALBUMIN" in the last 168 hours. No results for input(s): "LIPASE", "AMYLASE" in the last 168 hours. No results for input(s): "AMMONIA" in the last 168 hours. Coagulation Profile: No results for input(s): "INR", "PROTIME" in the last 168 hours. Cardiac Enzymes: No results for input(s): "CKTOTAL", "CKMB", "CKMBINDEX", "TROPONINI" in the last 168 hours. BNP (last 3 results) No results for input(s): "PROBNP" in the last 8760 hours. HbA1C: No results for input(s): "HGBA1C" in the last 72 hours. CBG: No results for input(s): "GLUCAP" in the last 168 hours. Lipid Profile: No results for input(s): "CHOL", "HDL", "LDLCALC", "TRIG", "CHOLHDL", "LDLDIRECT" in the last 72 hours. Thyroid Function Tests: No results for input(s): "TSH", "T4TOTAL", "FREET4", "T3FREE", "THYROIDAB" in  the last 72 hours. Anemia Panel: No results for input(s): "VITAMINB12", "FOLATE", "FERRITIN", "TIBC", "IRON", "RETICCTPCT" in the last 72 hours. Urine analysis:    Component Value Date/Time   COLORURINE YELLOW 04/07/2022 1039   APPEARANCEUR CLEAR 04/07/2022 1039   LABSPEC 1.021 04/07/2022 1039   PHURINE 5.0 04/07/2022 1039   GLUCOSEU NEGATIVE 04/07/2022 1039   GLUCOSEU NEGATIVE 09/14/2021 1337   HGBUR MODERATE (A) 04/07/2022 1039   BILIRUBINUR NEGATIVE 04/07/2022 1039   KETONESUR 20 (A) 04/07/2022 1039   PROTEINUR 100 (A) 04/07/2022 1039   UROBILINOGEN 0.2 09/14/2021 1337   NITRITE NEGATIVE 04/07/2022 1039   LEUKOCYTESUR TRACE (A) 04/07/2022 1039    Radiological Exams on Admission: CT CHEST ABDOMEN PELVIS W CONTRAST  Result Date: 04/07/2022 CLINICAL DATA:  Cough.  Sepsis. EXAM: CT CHEST, ABDOMEN, AND PELVIS WITH CONTRAST TECHNIQUE: Multidetector CT imaging of the chest, abdomen and pelvis was performed following the standard protocol during bolus administration of intravenous contrast. RADIATION DOSE REDUCTION: This exam was performed according to the departmental dose-optimization program which includes automated exposure control, adjustment of the mA and/or kV according to patient size and/or use of iterative reconstruction technique. CONTRAST:  122mL OMNIPAQUE IOHEXOL 300 MG/ML  SOLN COMPARISON:  CT the abdomen and pelvis November 12, 2019. Chest x-ray April 07, 2022. Chest x-ray October 19, 2015. FINDINGS: CT CHEST FINDINGS Cardiovascular: Heart size is normal. Calcified atherosclerotic changes are identified in the nonaneurysmal aorta. No dissection. Central pulmonary arteries are normal in caliber. No central pulmonary artery filling defects. Mediastinum/Nodes: The thyroid and esophagus are normal. No pleural or pericardial effusions. Few partially calcified lymph nodes are identified in the mediastinum consistent with previous granulomatous disease. No suspicious adenopathy is identified.  The chest wall is normal. Lungs/Pleura: The trachea and mainstem bronchi are are well aerated. There is opacification of the bronchus intermedius and right lower lobe bronchus. No pneumothorax. Emphysematous changes identified in the lungs, particularly the upper lobes. A nodule in the medial right apex on series 7, image 26 is unchanged since June 2017, of no significance. Scattered opacities are identified throughout the right upper lobe, primarily ground glass and nodular in nature. The right middle lobe is largely collapsed with air bronchograms. There is complete collapse of  the right lower lobe. No mass identified. Mild atelectasis in the left base. No suspicious nodules, masses, or infiltrates identified in the left lung. Musculoskeletal: No chest wall mass or suspicious bone lesions identified. CT ABDOMEN PELVIS FINDINGS Hepatobiliary: The patient is status post cholecystectomy. No liver masses identified. The portal vein is patent. Pancreas: Unremarkable. No pancreatic ductal dilatation or surrounding inflammatory changes. Spleen: Normal in size without focal abnormality. Adrenals/Urinary Tract: Adrenal glands are unremarkable. Kidneys are normal, without renal calculi, focal lesion, or hydronephrosis. Bladder is unremarkable. Stomach/Bowel: Stomach is within normal limits. Appendix appears normal. No evidence of bowel wall thickening, distention, or inflammatory changes. Vascular/Lymphatic: Calcified atherosclerotic changes are identified in the nonaneurysmal aorta, iliac vessels, and femoral vessels. No adenopathy. Reproductive: Prostate is unremarkable. Other: No abdominal wall hernia or abnormality. No abdominopelvic ascites. Musculoskeletal: No acute or significant osseous findings. IMPRESSION: 1. There is opacification of the bronchus intermedius and right lower lobe bronchus with complete collapse of the right lower lobe. The right middle lobe is largely collapsed with air bronchograms. The  constellation of findings could be due to mucous plugging or aspiration. Recommend short-term follow-up imaging imaging to ensure resolution. 2. Scattered opacities in the right upper lobe are likely infection/pneumonia. Recommend attention on follow-up. 3. Emphysematous changes in the lungs. 4. Calcified atherosclerotic changes in the nonaneurysmal aorta, iliac vessels, and femoral vessels. 5. No other acute abnormalities. 6. Aortic atherosclerosis. Aortic Atherosclerosis (ICD10-I70.0) and Emphysema (ICD10-J43.9). Electronically Signed   By: Gerome Sam III M.D.   On: 04/07/2022 14:24   DG Chest 2 View  Result Date: 04/07/2022 CLINICAL DATA:  Hypoxia.  Increased weakness. EXAM: CHEST - 2 VIEW COMPARISON:  One-view chest x-ray 11/11/2019 FINDINGS: Heart is mildly enlarged. Chronic elevation the right hemidiaphragm noted. Mild pulmonary vascular congestion is present. A right pleural effusion is present. Asymmetric right basilar airspace disease is present. No significant edema is present. IMPRESSION: 1. Cardiomegaly and mild pulmonary vascular congestion without frank edema. 2. Right pleural effusion and asymmetric right basilar airspace disease. While this may represent atelectasis, there is concern for infection. Electronically Signed   By: Marin Roberts M.D.   On: 04/07/2022 11:25     Assessment/Plan Principal Problem:   CAP (community acquired pneumonia) Active Problems:   ALS   Essential hypertension   Hypokalemia   Acute hypoxemic respiratory failure (HCC)   Acute hypoxic respiratory failure: Present with cough, weakness, shortness of breath.  Not on oxygen at home.  Hypoxia on arrival requiring 2 L of oxygen.We will try to wean the oxygen as tolerated  Right-sided pneumonia/aspiration pneumonia: Most likely this is aspiration pneumonia.  CT imaging as above showing collapse of right lower lobe and middle lobe.  Will start on broad-spectrum antibiotics with vancomycin and Zosyn  for now.  Get MRSA PCR.  Follow-up blood cultures.  Continue gentle IV fluids Pulmonology consulted because patient might need bronchoscopy. Aspiration precaution.  Speech therapy consulted.  Continue dysphagia 2 diet for now. Patient with leukocytosis.  Lactate level normal.  Will check procalcitonin  Severe hypokalemia: Potassium of 2.2 presentation.  Check magnesium. Should be aggressively supplemented  History of hypertension: Takes metoprolol, amlodipine, hydrochlorothiazide .  Currently normotensive.  These medications are on hold  Hyperlipidemia: Takes Crestor  History of ALS: Bedbound / wheelchair-bound mostly.  Will request PT/evaluation when appropriate.  Lives with family    Severity of Illness: The appropriate patient status for this patient is INPATIENT.   DVT prophylaxis: Lovenox Code Status: Full Family Communication: Called  daughter and wife on phone today, culture not received Consults called: PCCM     Shelly Coss MD [facility]  04/07/2022, 3:39 PM

## 2022-04-07 NOTE — Progress Notes (Signed)
NIF -10 VC not available

## 2022-04-07 NOTE — Consult Note (Signed)
NAMEBraydin Raymond, MRN:  [id number], DOB:  May 03, 1945, LOS: 0 ADMISSION DATE:  04/07/2022, CONSULTATION DATE:  04/07/2022  REFERRING MD:  Johnney Killian, ED P , CHIEF COMPLAINT:  RLL collapse lung   History of Present Illness:  76 year old 76 man with ALS, wheelchair-bound presented to ED with weakness for 1 week with shortness of breath preceded by sore throat.  Saturating 88% on room air increased to 93% on 2 L He reported fever with chills.  He saw his doctor and was given hydrocodone cough syrup Chest x-ray showed elevated right hemidiaphragm with volume loss on the right with right lower lobe airspace disease versus effusion He was given ceftriaxone, hypokalemia of 2.2 was supplemented CT chest/abdomen/pelvis showed right middle lobe consolidation and collapse of right lower lobe with consolidation, some groundglass in the right upper lobe.  There was emphysema especially in the upper lobes and calcified mediastinal lymph nodes consistent with prior granulomatous disease PCCM consulted to opine about bronchoscopy  He was seen by PCP 01/24/2022 complaining of a cough and given Zyrtec and Nasacort  Pertinent  Medical History  Hypertension Progressive muscular atrophy versus ALS, ongoing for 20 years-wheelchair-bound 2000 -severe pneumonia requiring tracheostomy and PEG  Significant Hospital Events: Including procedures, antibiotic start and stop dates in addition to other pertinent events     Interim History / Subjective:  Afebrile, wife at bedside. History is obtained with their limited English and reviewing the chart  Objective   Blood pressure 134/73, pulse 82, temperature 99.9 F (37.7 C), temperature source Oral, resp. rate 20, SpO2 94 %.       No intake or output data in the 24 hours ending 04/07/22 1548 There were no vitals filed for this visit.  Examination: General: Adult man lying in ED stretcher, no distress on 2 L nasal cannula HENT: Mild pallor, no icterus, no  JVD Lungs: Decreased breath sounds on right, no accessory muscle use Cardiovascular: S1-S2 regular Abdomen: Soft, nontender, no guarding Extremities: Wasting of hands and feet Neuro: Decreased reflexes upper and lower extremities, power left arm 3/5, all others 2/5, alert, interactive, nonfocal  Resolved Hospital Problem list     Assessment & Plan:  Acute hypoxic respiratory failure Right middle and lower lobe atelectasis/consolidation possibly related to aspiration pneumonia. There is suggestion of endobronchial lesion in the right lower lobe bronchus which may be aspirated food or secretions.  Lung malignancy is possible  -Will proceed with bronchoscopy under conscious sedation tomorrow.  I discussed risks and benefits of the procedure with him in detail including risks of conscious sedation.  He is willing to proceed.  Will keep him N.p.o. after midnight tomorrow  -Swallow evaluation -with aspiration risk is likely related to his neurological condition -Aggressive tracheobronchial toilet with hypertonic saline nebs, flutter valve and chest PT  Best Practice (right click and "Reselect all SmartList Selections" daily)   Per [facility] Code Status:  full code Last date of multidisciplinary goals of care discussion [NA]  Labs   CBC: Recent Labs  Lab 04/07/22 1039  WBC 13.0*  HGB 15.2  HCT 46.2  MCV 90.1  PLT [id number]    Basic Metabolic Panel: Recent Labs  Lab 04/07/22 1039 04/07/22 1334  NA 137  --   K 2.2*  --   CL 84*  --   CO2 37*  --   GLUCOSE 105*  --   BUN 14  --   CREATININE 0.58*  --   CALCIUM 9.0  --   MG  --  2.2  PHOS  --  3.3   GFR: CrCl cannot be calculated (Unknown ideal weight.). Recent Labs  Lab 04/07/22 1039 04/07/22 1318  WBC 13.0*  --   LATICACIDVEN  --  1.1    Liver Function Tests: Recent Labs  Lab 04/07/22 1318  AST 28  ALT 17  ALKPHOS 84  BILITOT 1.4*  PROT 8.0  ALBUMIN 2.9*   No results for input(s): "LIPASE", "AMYLASE" in the last  168 hours. No results for input(s): "AMMONIA" in the last 168 hours.  ABG No results found for: "PHART", "PCO2ART", "PO2ART", "HCO3", "TCO2", "ACIDBASEDEF", "O2SAT"   Coagulation Profile: No results for input(s): "INR", "PROTIME" in the last 168 hours.  Cardiac Enzymes: No results for input(s): "CKTOTAL", "CKMB", "CKMBINDEX", "TROPONINI" in the last 168 hours.  HbA1C: Hgb A1c MFr Bld  Date/Time Value Ref Range Status  09/14/2021 01:37 PM 6.0 4.6 - 6.5 % Final    Comment:    Glycemic Control Guidelines for People with Diabetes:Non Diabetic:  <6%Goal of Therapy: <7%Additional Action Suggested:  >8%   10/13/2020 03:10 PM 6.1 4.6 - 6.5 % Final    Comment:    Glycemic Control Guidelines for People with Diabetes:Non Diabetic:  <6%Goal of Therapy: <7%Additional Action Suggested:  >8%     CBG: No results for input(s): "GLUCAP" in the last 168 hours.  Review of Systems:   Constitutional: negative for anorexia, fevers and sweats  Eyes: negative for irritation, redness and visual disturbance  Ears, nose, mouth, throat, and face: negative for earaches, epistaxis, nasal congestion and sore throat  Respiratory: negative for sputum and wheezing  Cardiovascular: negative for chest pain,  lower extremity edema, orthopnea, palpitations and syncope  Gastrointestinal: negative for abdominal pain, constipation, diarrhea, melena, nausea and vomiting  Genitourinary:negative for dysuria, frequency and hematuria  Hematologic/lymphatic: negative for bleeding, easy bruising and lymphadenopathy  Musculoskeletal:negative for arthralgias, muscle weakness and stiff joints  Neurological: Wheelchair-bound needs assistance for all activities of daily living Endocrine: negative for diabetic symptoms including polydipsia, polyuria and weight loss   Past Medical History:  He,  has a past medical history of ALLERGIC RHINITIS, ALS, HYPERLIPIDEMIA, HYPERTENSION, Impaired glucose tolerance (02/17/2011), LOW BACK  PAIN, and VERTIGO.   Surgical History:   Past Surgical History:  Procedure Laterality Date   CHOLECYSTECTOMY N/A 11/15/2019   Procedure: LAPAROSCOPIC CHOLECYSTECTOMY;  Surgeon: Coralie Keens, MD;  Location: [place];  Service: General;  Laterality: N/A;   COMPRESSION HIP SCREW Left 02/07/2014   Procedure: IM Nail;  Surgeon: Alta Corning, MD;  Location: [facility];  Service: Orthopedics;  Laterality: Left;   ERCP N/A 11/14/2019   Procedure: ENDOSCOPIC RETROGRADE CHOLANGIOPANCREATOGRAPHY (ERCP);  Surgeon: Irene Shipper, MD;  Location: [facility] ENDOSCOPY;  Service: Endoscopy;  Laterality: N/A;   IR GENERIC HISTORICAL  11/21/2015   IR RADIOLOGIST EVAL & MGMT 11/21/2015 Arne Cleveland, MD [facility] INTERV RAD   Lipoma removal  2003   Percutaneous gastrostomy tube repalcement     04/1999 and removed 07/1999   REMOVAL OF STONES  11/14/2019   Procedure: REMOVAL OF STONES;  Surgeon: Irene Shipper, MD;  Location: [facility] ENDOSCOPY;  Service: Endoscopy;;   SPHINCTEROTOMY  11/14/2019   Procedure: Joan Mayans;  Surgeon: Irene Shipper, MD;  Location: [facility] ENDOSCOPY;  Service: Endoscopy;;   TRACHEOSTOMY       Social History:   reports that he has never smoked. He has never used smokeless tobacco. He reports that he does not drink alcohol and does not use drugs.  Family History:  His family history includes Other in his father and mother; Other (age of onset: 2) in his daughter and son.   Allergies No Known Allergies   Home Medications  Prior to Admission medications   Medication Sig Start Date End Date Taking? Authorizing Provider  acetaminophen (TYLENOL) 325 MG tablet Take 2 tablets (650 mg total) by mouth every 6 (six) hours as needed for mild pain or moderate pain (or temp > 100). 11/16/19   Leroy Sea, MD  amLODipine (NORVASC) 10 MG tablet Take 1 tablet (10 mg total) by mouth daily. take 1 tablet (10 mg total) by mouth daily. 09/14/21   Corwin Levins, MD  cetirizine (ZYRTEC) 10 MG tablet Take 1 tablet (10 mg  total) by mouth daily as needed. 09/14/21   Corwin Levins, MD  gabapentin (NEURONTIN) 300 MG capsule Take 1 capsule (300 mg total) by mouth 3 (three) times daily. 09/14/21   Corwin Levins, MD  hydrochlorothiazide (MICROZIDE) 12.5 MG capsule Take 1 capsule (12.5 mg total) by mouth daily. 09/14/21   Corwin Levins, MD  meclizine (ANTIVERT) 12.5 MG tablet TAKE 1 TABLET BY MOUTH 3 TIMES A DAY AS NEEDED FOR DIZZINESS 09/14/21   Corwin Levins, MD  metoprolol succinate (TOPROL-XL) 50 MG 24 hr tablet Take 1 tablet (50 mg total) by mouth daily. TAKE WITH OR IMMEDIATELY FOLLOWING A MEAL. 09/14/21   Corwin Levins, MD  omeprazole (PRILOSEC) 20 MG capsule Take 1 capsule (20 mg total) by mouth daily. Must keep 09/14/21 appt for future refills 09/14/21   Corwin Levins, MD  rosuvastatin (CRESTOR) 20 MG tablet Take 1 tablet (20 mg total) by mouth daily. 09/14/21   Corwin Levins, MD  triamcinolone (NASACORT) 55 MCG/ACT AERO nasal inhaler Place 2 sprays into the nose daily. 01/24/22   Corwin Levins, MD      Cyril Mourning MD. FCCP. [facility] Pager : [phone]  If no response to pager , please call [phone] until 7 pm After 7:00 pm call Elink  [phone]   04/07/2022

## 2022-04-07 NOTE — ED Notes (Signed)
Hospitalist at bedside 

## 2022-04-07 NOTE — ED Provider Notes (Signed)
[facility] DEPT Provider Note   CSN: [id number] Arrival date & time: 04/07/22  1000     History  Chief Complaint  Patient presents with   Weakness   Cough    Thomas Raymond is a 76 y.o. male.  HPI Patient reports has had a cough off for about a week.  He reports cough has been harsh and dry.  He occasionally has chest pain associated with cough.  He feels mildly short of breath.  He reports he feels very weak generally.  He reports he saw his doctor earlier in the week and was given a cough suppressant but his symptoms have not improved.  Reports he is also had a fever and chills.  He has not measured his temperature at home but is sure he has had a fever.  Reports has had loss of appetite and not been able to eat anything over the past 2 days.  He has not actively been vomiting.  He reports he has gotten very weak to the point that he can hardly get up and is having difficulty doing usual daily activities.    Home Medications Prior to Admission medications   Medication Sig Start Date End Date Taking? Authorizing Provider  acetaminophen (TYLENOL) 325 MG tablet Take 2 tablets (650 mg total) by mouth every 6 (six) hours as needed for mild pain or moderate pain (or temp > 100). 11/16/19   Thurnell Lose, MD  amLODipine (NORVASC) 10 MG tablet Take 1 tablet (10 mg total) by mouth daily. take 1 tablet (10 mg total) by mouth daily. 09/14/21   Biagio Borg, MD  cetirizine (ZYRTEC) 10 MG tablet Take 1 tablet (10 mg total) by mouth daily as needed. 09/14/21   Biagio Borg, MD  gabapentin (NEURONTIN) 300 MG capsule Take 1 capsule (300 mg total) by mouth 3 (three) times daily. 09/14/21   Biagio Borg, MD  hydrochlorothiazide (MICROZIDE) 12.5 MG capsule Take 1 capsule (12.5 mg total) by mouth daily. 09/14/21   Biagio Borg, MD  meclizine (ANTIVERT) 12.5 MG tablet TAKE 1 TABLET BY MOUTH 3 TIMES A DAY AS NEEDED FOR DIZZINESS 09/14/21   Biagio Borg, MD  metoprolol  succinate (TOPROL-XL) 50 MG 24 hr tablet Take 1 tablet (50 mg total) by mouth daily. TAKE WITH OR IMMEDIATELY FOLLOWING A MEAL. 09/14/21   Biagio Borg, MD  omeprazole (PRILOSEC) 20 MG capsule Take 1 capsule (20 mg total) by mouth daily. Must keep 09/14/21 appt for future refills 09/14/21   Biagio Borg, MD  rosuvastatin (CRESTOR) 20 MG tablet Take 1 tablet (20 mg total) by mouth daily. 09/14/21   Biagio Borg, MD  triamcinolone (NASACORT) 55 MCG/ACT AERO nasal inhaler Place 2 sprays into the nose daily. 01/24/22   Biagio Borg, MD      Allergies    Patient has no known allergies.    Review of Systems   Review of Systems  Physical Exam Updated Vital Signs BP 134/73   Pulse 82   Temp 99.9 F (37.7 C) (Oral)   Resp 20   SpO2 94%  Physical Exam Constitutional:      Comments: Alert nontoxic.  Well-nourished well-developed.  No respiratory distress at rest.  HENT:     Mouth/Throat:     Mouth: Mucous membranes are dry.     Pharynx: Oropharynx is clear.  Eyes:     Extraocular Movements: Extraocular movements intact.  Cardiovascular:     Rate  and Rhythm: Normal rate and regular rhythm.  Pulmonary:     Effort: Pulmonary effort is normal.     Comments: Sounds diminished right base.  No rhonchi.  No Gross crackles. Abdominal:     General: There is no distension.     Palpations: Abdomen is soft.     Tenderness: There is no abdominal tenderness. There is no guarding.  Musculoskeletal:        General: No swelling or tenderness.     Right lower leg: No edema.     Left lower leg: No edema.  Skin:    General: Skin is warm and dry.  Neurological:     Comments: Patient is alert.  Speech is clear.  Follows commands but has significant generalized weakness.  Psychiatric:        Mood and Affect: Mood normal.     ED Results / Procedures / Treatments   Labs (all labs ordered are listed, but only abnormal results are displayed) Labs Reviewed  BASIC METABOLIC PANEL - Abnormal; Notable for  the following components:      Result Value   Potassium 2.2 (*)    Chloride 84 (*)    CO2 37 (*)    Glucose, Bld 105 (*)    Creatinine, Ser 0.58 (*)    Anion gap 16 (*)    All other components within normal limits  CBC - Abnormal; Notable for the following components:   WBC 13.0 (*)    All other components within normal limits  RESP PANEL BY RT-PCR (RSV, FLU A&B, COVID)  [id number]  CULTURE, BLOOD (ROUTINE X 2)  CULTURE, BLOOD (ROUTINE X 2)  URINALYSIS, ROUTINE W REFLEX MICROSCOPIC  HEPATIC FUNCTION PANEL  LACTIC ACID, PLASMA  LACTIC ACID, PLASMA  BRAIN NATRIURETIC PEPTIDE  MAGNESIUM  PHOSPHORUS  TSH  TROPONIN I (HIGH SENSITIVITY)  TROPONIN I (HIGH SENSITIVITY)    EKG EKG Interpretation  Date/Time:  Sunday April 07 2022 14:28:22 EST Ventricular Rate:  102 PR Interval:  152 QRS Duration: 97 QT Interval:  333 QTC Calculation: 434 R Axis:   200 Text Interpretation: Ectopic atrial tachycardia, unifocal Probable left atrial enlargement Probable right ventricular hypertrophy Borderline ST depression, lateral leads Borderline ST elevation, lateral leads increased rate but no sig change from previous Confirmed by Charlesetta Shanks [phone]) on 04/07/2022 2:54:46 PM  Radiology CT CHEST ABDOMEN PELVIS W CONTRAST  Result Date: 04/07/2022 CLINICAL DATA:  Cough.  Sepsis. EXAM: CT CHEST, ABDOMEN, AND PELVIS WITH CONTRAST TECHNIQUE: Multidetector CT imaging of the chest, abdomen and pelvis was performed following the standard protocol during bolus administration of intravenous contrast. RADIATION DOSE REDUCTION: This exam was performed according to the departmental dose-optimization program which includes automated exposure control, adjustment of the mA and/or kV according to patient size and/or use of iterative reconstruction technique. CONTRAST:  161mL OMNIPAQUE IOHEXOL 300 MG/ML  SOLN COMPARISON:  CT the abdomen and pelvis November 12, 2019. Chest x-ray April 07, 2022. Chest x-ray October 19, 2015. FINDINGS: CT CHEST FINDINGS Cardiovascular: Heart size is normal. Calcified atherosclerotic changes are identified in the nonaneurysmal aorta. No dissection. Central pulmonary arteries are normal in caliber. No central pulmonary artery filling defects. Mediastinum/Nodes: The thyroid and esophagus are normal. No pleural or pericardial effusions. Few partially calcified lymph nodes are identified in the mediastinum consistent with previous granulomatous disease. No suspicious adenopathy is identified. The chest wall is normal. Lungs/Pleura: The trachea and mainstem bronchi are are well aerated. There is opacification of the bronchus intermedius and  right lower lobe bronchus. No pneumothorax. Emphysematous changes identified in the lungs, particularly the upper lobes. A nodule in the medial right apex on series 7, image 26 is unchanged since June 2017, of no significance. Scattered opacities are identified throughout the right upper lobe, primarily ground glass and nodular in nature. The right middle lobe is largely collapsed with air bronchograms. There is complete collapse of the right lower lobe. No mass identified. Mild atelectasis in the left base. No suspicious nodules, masses, or infiltrates identified in the left lung. Musculoskeletal: No chest wall mass or suspicious bone lesions identified. CT ABDOMEN PELVIS FINDINGS Hepatobiliary: The patient is status post cholecystectomy. No liver masses identified. The portal vein is patent. Pancreas: Unremarkable. No pancreatic ductal dilatation or surrounding inflammatory changes. Spleen: Normal in size without focal abnormality. Adrenals/Urinary Tract: Adrenal glands are unremarkable. Kidneys are normal, without renal calculi, focal lesion, or hydronephrosis. Bladder is unremarkable. Stomach/Bowel: Stomach is within normal limits. Appendix appears normal. No evidence of bowel wall thickening, distention, or inflammatory changes. Vascular/Lymphatic: Calcified  atherosclerotic changes are identified in the nonaneurysmal aorta, iliac vessels, and femoral vessels. No adenopathy. Reproductive: Prostate is unremarkable. Other: No abdominal wall hernia or abnormality. No abdominopelvic ascites. Musculoskeletal: No acute or significant osseous findings. IMPRESSION: 1. There is opacification of the bronchus intermedius and right lower lobe bronchus with complete collapse of the right lower lobe. The right middle lobe is largely collapsed with air bronchograms. The constellation of findings could be due to mucous plugging or aspiration. Recommend short-term follow-up imaging imaging to ensure resolution. 2. Scattered opacities in the right upper lobe are likely infection/pneumonia. Recommend attention on follow-up. 3. Emphysematous changes in the lungs. 4. Calcified atherosclerotic changes in the nonaneurysmal aorta, iliac vessels, and femoral vessels. 5. No other acute abnormalities. 6. Aortic atherosclerosis. Aortic Atherosclerosis (ICD10-I70.0) and Emphysema (ICD10-J43.9). Electronically Signed   By: Gerome Sam III M.D.   On: 04/07/2022 14:24   DG Chest 2 View  Result Date: 04/07/2022 CLINICAL DATA:  Hypoxia.  Increased weakness. EXAM: CHEST - 2 VIEW COMPARISON:  One-view chest x-ray 11/11/2019 FINDINGS: Heart is mildly enlarged. Chronic elevation the right hemidiaphragm noted. Mild pulmonary vascular congestion is present. A right pleural effusion is present. Asymmetric right basilar airspace disease is present. No significant edema is present. IMPRESSION: 1. Cardiomegaly and mild pulmonary vascular congestion without frank edema. 2. Right pleural effusion and asymmetric right basilar airspace disease. While this may represent atelectasis, there is concern for infection. Electronically Signed   By: Marin Roberts M.D.   On: 04/07/2022 11:25    Procedures Procedures    Medications Ordered in ED Medications  potassium chloride 10 mEq in 100 mL IVPB (10  mEq Intravenous New Bag/Given 04/07/22 1436)  lactated ringers infusion ( Intravenous New Bag/Given 04/07/22 1437)  cefTRIAXone (ROCEPHIN) 1 g in sodium chloride 0.9 % 100 mL IVPB (has no administration in time range)  azithromycin (ZITHROMAX) 500 mg in sodium chloride 0.9 % 250 mL IVPB (has no administration in time range)  metroNIDAZOLE (FLAGYL) IVPB 500 mg (has no administration in time range)  iohexol (OMNIPAQUE) 300 MG/ML solution 100 mL (100 mLs Intravenous Contrast Given 04/07/22 1347)    ED Course/ Medical Decision Making/ A&P                           Medical Decision Making Amount and/or Complexity of Data Reviewed Labs: ordered. Radiology: ordered.  Risk Prescription drug management. Decision regarding hospitalization.  Patient presents with a week of respiratory illness with subjective fever and chills.  He reports that he has been taking a cough suppressant but getting worse.  He reports he has had total loss of appetite and not anything to eat for several days.  Patient is hypoxic at 86% on room air.  He is placed on 2 L nasal cannula.  Clinically he is not showing respiratory distress.  Differential diagnosis includes viral pneumonia\bacterial pneumonia\pulmonary embolus\dehydration/metabolic derangement.  Will proceed with broad diagnostic evaluation.  To potassium returns low at 2.2.  Patient will need supplemental potassium replacement with symptomatic weakness.  Patient has leukocytosis.  Chest x-ray suggests a right sided pneumonia versus atelectasis.  I have personally reviewed the two-view chest x-ray.  There is a fairly large right-sided effusion.  Given patient's symptoms also concern possibly for malignancy or bacterial effusion.  Will add CT scan of the chest to define extent of potential pneumonia versus effusion.  Patient will require admission with hypoxia on room air and significant hypokalemia symptomatic with general weakness.  CT results, will also cover  for aspiration pneumonia.  Patient given Rocephin, Zithromax and Flagyl.  Consult: [facility] hospitalist Dr. For admission.  Requests consultation to critical care for possible bronchoscopy. Consult:Critical care Dr. Elsworth Soho will consult for possible brocnchoscopy        Final Clinical Impression(s) / ED Diagnoses Final diagnoses:  Hypoxia  Hypokalemia  Generalized weakness  Acute cough  Pneumonia of right middle lobe due to infectious organism    Rx / DC Orders ED Discharge Orders     None         Charlesetta Shanks, MD 04/07/22 [phone]

## 2022-04-07 NOTE — ED Triage Notes (Signed)
Pt BIBA from home for increasing weakness x1 week. Ambulates with difficulty at baseline, hx parkinsons. Got cough medicine a week ago, states it it not helping. Hurts when he coughs. Denies other chest pain, SHOB, abd pain. EMS notes rhonchi in lower fields, also noted to be 88% RA, increased to 93% on 2L [place]. Reports sore throat and feeling warm. Denies NVD. Reports in the mornings is it harder to pee and sometimes burns. Interpreter service used for triage.

## 2022-04-07 NOTE — Progress Notes (Signed)
Pharmacy Antibiotic Note  Thomas Raymond is a 76 y.o. male admitted on 04/07/2022 with pneumonia.  Pharmacy has been consulted for vanc/zosyn dosing.  Plan: Vanc 1250mg  IV q24 - goal AUC 400-550 Zosyn 3.375g IV q8 (extended interval infusion) Daily SCr     Temp (24hrs), Avg:99.7 F (37.6 C), Min:99.4 F (37.4 C), Max:99.9 F (37.7 C)  Recent Labs  Lab 04/07/22 1039 04/07/22 1318  WBC 13.0*  --   CREATININE 0.58*  --   LATICACIDVEN  --  1.1    CrCl cannot be calculated (Unknown ideal weight.).    No Known Allergies    Thank you for allowing pharmacy to be a part of this patient's care.  04/09/22 04/07/2022 3:48 PM

## 2022-04-08 ENCOUNTER — Inpatient Hospital Stay (HOSPITAL_COMMUNITY): Payer: Medicare HMO

## 2022-04-08 ENCOUNTER — Encounter (HOSPITAL_COMMUNITY): Payer: Self-pay | Admitting: Internal Medicine

## 2022-04-08 ENCOUNTER — Encounter (HOSPITAL_COMMUNITY)
Admission: EM | Disposition: A | Discharge status: Home / Self Care | Payer: Self-pay | Source: Home / Self Care | Attending: Internal Medicine

## 2022-04-08 DIAGNOSIS — T17800D Unspecified foreign body in other parts of respiratory tract causing asphyxiation, subsequent encounter: Secondary | ICD-10-CM | POA: Diagnosis not present

## 2022-04-08 DIAGNOSIS — T17800A Unspecified foreign body in other parts of respiratory tract causing asphyxiation, initial encounter: Secondary | ICD-10-CM | POA: Diagnosis not present

## 2022-04-08 DIAGNOSIS — J189 Pneumonia, unspecified organism: Secondary | ICD-10-CM | POA: Diagnosis not present

## 2022-04-08 HISTORY — PX: BRONCHIAL WASHINGS: SHX5105

## 2022-04-08 HISTORY — PX: VIDEO BRONCHOSCOPY: SHX5072

## 2022-04-08 LAB — BASIC METABOLIC PANEL
Anion gap: 15 (ref 5–15)
BUN: 13 mg/dL (ref 8–23)
CO2: 30 mmol/L (ref 22–32)
Calcium: 8.2 mg/dL — ABNORMAL LOW (ref 8.9–10.3)
Chloride: 93 mmol/L — ABNORMAL LOW (ref 98–111)
Creatinine, Ser: 0.56 mg/dL — ABNORMAL LOW (ref 0.61–1.24)
GFR, Estimated: >60 mL/min (ref >60)
Glucose, Bld: 83 mg/dL (ref 70–99)
Potassium: 2.9 mmol/L — ABNORMAL LOW (ref 3.5–5.1)
Sodium: 138 mmol/L (ref 135–145)

## 2022-04-08 LAB — CBC
HCT: 37.6 % — ABNORMAL LOW (ref 39.0–52.0)
Hemoglobin: 12 g/dL — ABNORMAL LOW (ref 13.0–17.0)
MCH: 29.3 pg (ref 26.0–34.0)
MCHC: 31.9 g/dL (ref 30.0–36.0)
MCV: 91.7 fL (ref 80.0–100.0)
Platelets: 295 10*3/uL (ref 150–400)
RBC: 4.1 MIL/uL — ABNORMAL LOW (ref 4.22–5.81)
RDW: 13.3 % (ref 11.5–15.5)
WBC: 11.1 10*3/uL — ABNORMAL HIGH (ref 4.0–10.5)
nRBC: 0 % (ref 0.0–0.2)

## 2022-04-08 SURGERY — VIDEO BRONCHOSCOPY WITHOUT FLUORO
Anesthesia: IV Sedation (MBSC Only) | Laterality: Right

## 2022-04-08 MED ORDER — FENTANYL CITRATE (PF) 100 MCG/2ML IJ SOLN
INTRAMUSCULAR | Status: AC
  Filled 2022-04-08: qty 4

## 2022-04-08 MED ORDER — LIDOCAINE HCL 1 % IJ SOLN
INTRAMUSCULAR | Status: AC
  Filled 2022-04-08: qty 20

## 2022-04-08 MED ORDER — MIDAZOLAM HCL (PF) 5 MG/ML IJ SOLN
INTRAMUSCULAR | Status: AC
  Filled 2022-04-08: qty 2

## 2022-04-08 MED ORDER — POTASSIUM CHLORIDE 10 MEQ/100ML IV SOLN
10.0000 meq | INTRAVENOUS | Status: AC
  Administered 2022-04-08 (×3): 10 meq via INTRAVENOUS
  Filled 2022-04-08 (×3): qty 100

## 2022-04-08 MED ORDER — LIDOCAINE HCL URETHRAL/MUCOSAL 2 % EX GEL
CUTANEOUS | Status: DC | PRN
  Administered 2022-04-08: 1 via TOPICAL

## 2022-04-08 MED ORDER — LIDOCAINE HCL 1 % IJ SOLN
INTRAMUSCULAR | Status: DC | PRN
  Administered 2022-04-08: 8 mL via INTRAPLEURAL

## 2022-04-08 MED ORDER — SODIUM CHLORIDE 0.9 % IV SOLN: INTRAVENOUS | Status: DC | PRN

## 2022-04-08 MED ORDER — FENTANYL CITRATE (PF) 100 MCG/2ML IJ SOLN
INTRAMUSCULAR | Status: DC | PRN
  Administered 2022-04-08 (×2): 50 ug via INTRAVENOUS

## 2022-04-08 MED ORDER — PHENYLEPHRINE HCL 0.25 % NA SOLN
NASAL | Status: AC
  Filled 2022-04-08: qty 15

## 2022-04-08 MED ORDER — POTASSIUM CHLORIDE 10 MEQ/100ML IV SOLN: 10.0000 meq | INTRAVENOUS | Status: DC

## 2022-04-08 MED ORDER — LIDOCAINE HCL URETHRAL/MUCOSAL 2 % EX GEL
CUTANEOUS | Status: AC
  Filled 2022-04-08: qty 30

## 2022-04-08 MED ORDER — MIDAZOLAM HCL (PF) 10 MG/2ML IJ SOLN
INTRAMUSCULAR | Status: DC | PRN
  Administered 2022-04-08: 1 mg via INTRAVENOUS

## 2022-04-08 MED ORDER — PHENYLEPHRINE HCL 0.25 % NA SOLN
1.0000 | Freq: Four times a day (QID) | NASAL | Status: DC | PRN
  Administered 2022-04-08: 1 via NASAL

## 2022-04-08 MED ORDER — LIDOCAINE HCL URETHRAL/MUCOSAL 2 % EX GEL: 1.0000 | Freq: Once | CUTANEOUS | Status: DC

## 2022-04-08 MED ORDER — BUTAMBEN-TETRACAINE-BENZOCAINE 2-2-14 % EX AERO: 1.0000 | INHALATION_SPRAY | Freq: Once | CUTANEOUS | Status: DC

## 2022-04-08 NOTE — Progress Notes (Addendum)
PROGRESS NOTE  [place]  KGM:[id number] DOB: 12-Nov-1945 DOA: 04/07/2022 PCP: Corwin Levins, MD   Brief Narrative: Thomas Raymond is a Cambodian speaking 76 y.o. male with medical history significant of hypertension, ALS with wheelchair-bound /bedbound status presented from home with complaints of cough which started about a week ago .On presentation, he was hemodynamically stable but was hypoxic with saturation of 86% on room air and was placed on 2 L of nasal cannula.  Lab work showed potassium of 2.2.  Chest imagings  showed right-sided pneumonia with opacification of the bronchus, right lower lobe, right middle lobe collapse.  Admitted for the management of aspiration pneumonia.  Patient also consulted, plan for bronchoscopy.  Assessment & Plan:  Principal Problem:   Aspiration into lower respiratory tract Active Problems:   ALS   Essential hypertension   Hypokalemia   Acute hypoxemic respiratory failure (HCC)   CAP (community acquired pneumonia)   Acute hypoxic respiratory failure: Present with cough, weakness, shortness of breath.  Not on oxygen at home.  Hypoxia on arrival requiring 2 L of oxygen.We will try to wean the oxygen as tolerated   Right-sided pneumonia/aspiration pneumonia: Most likely this is aspiration pneumonia.  CT imaging as above showing collapse of right lower lobe and middle lobe.R/O lung malignancy. Currently on zosyn.Follow-up blood cultures.  Continue gentle IV fluids Pulmonology consulted because patient might need bronchoscopy. Aspiration precaution.  Speech therapy consulted.  Improving  leukocytosis.  Lactate level normal.     Severe hypokalemia: Being supplemented and monitored  H/O  of hypertension: Takes metoprolol, amlodipine, hydrochlorothiazide .  Currently normotensive.  These medications are on hold   Hyperlipidemia: Takes Crestor   History of ALS: Bedbound / wheelchair-bound mostly.  PT /oT consulted.  Lives with family          DVT  prophylaxis:enoxaparin (LOVENOX) injection 40 mg Start: 04/07/22 2200     Code Status: Full Code  Family Communication: Wife at bedside  Patient status:Inpatient  Patient is from :Home  Anticipated discharge UY:QIHK  Estimated DC date:1-2 days   Consultants: PCCM  Procedures:None yet  Antimicrobials:  Anti-infectives (From admission, onward)    Start     Dose/Rate Route Frequency Ordered Stop   04/07/22 1600  vancomycin (VANCOREADY) IVPB 1250 mg/250 mL  Status:  Discontinued        1,250 mg 166.7 mL/hr over 90 Minutes Intravenous Every 24 hours 04/07/22 1551 04/08/22 0814   04/07/22 1600  piperacillin-tazobactam (ZOSYN) IVPB 3.375 g        3.375 g 12.5 mL/hr over 240 Minutes Intravenous Every 8 hours 04/07/22 1551     04/07/22 1500  cefTRIAXone (ROCEPHIN) 1 g in sodium chloride 0.9 % 100 mL IVPB  Status:  Discontinued        1 g 200 mL/hr over 30 Minutes Intravenous  Once 04/07/22 1451 04/07/22 1550   04/07/22 1500  azithromycin (ZITHROMAX) 500 mg in sodium chloride 0.9 % 250 mL IVPB  Status:  Discontinued        500 mg 250 mL/hr over 60 Minutes Intravenous  Once 04/07/22 1451 04/07/22 1550   04/07/22 1500  metroNIDAZOLE (FLAGYL) IVPB 500 mg  Status:  Discontinued        500 mg 100 mL/hr over 60 Minutes Intravenous  Once 04/07/22 1451 04/07/22 1550       Subjective:  Patient seen and examined at bedside this morning.  Hemodynamically stable.  Denies any worsening shortness of breath or cough.  Lies on the  bed.  Wife at bedside.  Complains of thirst and wants some ice water.  Objective: Vitals:   04/08/22 0457 04/08/22 0700 04/08/22 0850 04/08/22 1336  BP:  115/64 126/64 136/81  Pulse:  69 76 87  Resp:  17 20 (!) 24  Temp: 98.6 F (37 C)  97.7 F (36.5 C) (!) 97.5 F (36.4 C)  TempSrc: Oral  Oral Oral  SpO2:  96% 95% 96%    Intake/Output Summary (Last 24 hours) at 04/08/2022 1403 Last data filed at 04/08/2022 1207 Gross per 24 hour  Intake 1762.02 ml   Output --  Net 1762.02 ml   There were no vitals filed for this visit.  Examination:  General exam: Overall comfortable, not in distress, deconditioned, chronically ill looking HEENT: PERRL Respiratory system: Diminished air sounds on the left side Cardiovascular system: S1 & S2 heard, RRR.  Gastrointestinal system: Abdomen is nondistended, soft and nontender. Central nervous system: Alert and oriented, generalized weakness Extremities: No edema, no clubbing ,no cyanosis Skin: No rashes, no ulcers,no icterus     Data Reviewed: I have personally reviewed following labs and imaging studies  CBC: Recent Labs  Lab 04/07/22 1039 04/08/22 0500  WBC 13.0* 11.1*  HGB 15.2 12.0*  HCT 46.2 37.6*  MCV 90.1 91.7  PLT 321 295   Basic Metabolic Panel: Recent Labs  Lab 04/07/22 1039 04/07/22 1334 04/08/22 0500  NA 137  --  138  K 2.2*  --  2.9*  CL 84*  --  93*  CO2 37*  --  30  GLUCOSE 105*  --  83  BUN 14  --  13  CREATININE 0.58*  --  0.56*  CALCIUM 9.0  --  8.2*  MG  --  2.2  --   PHOS  --  3.3  --      Recent Results (from the past 240 hour(s))  Resp panel by RT-PCR (RSV, Flu A&B, Covid) Anterior Nasal Swab     Status: Abnormal   Collection Time: 04/07/22  1:18 PM   Specimen: Anterior Nasal Swab  Result Value Ref Range Status   SARS Coronavirus 2 by RT PCR NEGATIVE NEGATIVE Final    Comment: (NOTE) SARS-CoV-2 target nucleic acids are NOT DETECTED.  The SARS-CoV-2 RNA is generally detectable in upper respiratory specimens during the acute phase of infection. The lowest concentration of SARS-CoV-2 viral copies this assay can detect is 138 copies/mL. A negative result does not preclude SARS-Cov-2 infection and should not be used as the sole basis for treatment or other patient management decisions. A negative result may occur with  improper specimen collection/handling, submission of specimen other than nasopharyngeal swab, presence of viral mutation(s) within  the areas targeted by this assay, and inadequate number of viral copies(<138 copies/mL). A negative result must be combined with clinical observations, patient history, and epidemiological information. The expected result is Negative.  Fact Sheet for Patients:  BloggerCourse.com  Fact Sheet for Healthcare Providers:  SeriousBroker.it  This test is no t yet approved or cleared by the [place] FDA and  has been authorized for detection and/or diagnosis of SARS-CoV-2 by FDA under an Emergency Use Authorization (EUA). This EUA will remain  in effect (meaning this test can be used) for the duration of the COVID-19 declaration under Section 564(b)(1) of the Act, 21 U.S.C.section 360bbb-3(b)(1), unless the authorization is terminated  or revoked sooner.       Influenza A by PCR NEGATIVE NEGATIVE Final   Influenza B by  PCR NEGATIVE NEGATIVE Final    Comment: (NOTE) The Xpert Xpress SARS-CoV-2/FLU/RSV plus assay is intended as an aid in the diagnosis of influenza from Nasopharyngeal swab specimens and should not be used as a sole basis for treatment. Nasal washings and aspirates are unacceptable for Xpert Xpress SARS-CoV-2/FLU/RSV testing.  Fact Sheet for Patients: BloggerCourse.com  Fact Sheet for Healthcare Providers: SeriousBroker.it  This test is not yet approved or cleared by the [place] FDA and has been authorized for detection and/or diagnosis of SARS-CoV-2 by FDA under an Emergency Use Authorization (EUA). This EUA will remain in effect (meaning this test can be used) for the duration of the COVID-19 declaration under Section 564(b)(1) of the Act, 21 U.S.C. section 360bbb-3(b)(1), unless the authorization is terminated or revoked.     Resp Syncytial Virus by PCR POSITIVE (A) NEGATIVE Final    Comment: (NOTE) Fact Sheet for  Patients: BloggerCourse.com  Fact Sheet for Healthcare Providers: SeriousBroker.it  This test is not yet approved or cleared by the [place] FDA and has been authorized for detection and/or diagnosis of SARS-CoV-2 by FDA under an Emergency Use Authorization (EUA). This EUA will remain in effect (meaning this test can be used) for the duration of the COVID-19 declaration under Section 564(b)(1) of the Act, 21 U.S.C. section 360bbb-3(b)(1), unless the authorization is terminated or revoked.  Performed at [facility], [address], [place], [place] [address]   Culture, blood (routine x 2)     Status: None (Preliminary result)   Collection Time: 04/07/22  1:30 PM   Specimen: BLOOD  Result Value Ref Range Status   Specimen Description   Final    BLOOD LEFT ANTECUBITAL Performed at [facility], [address] [place], [place] [address]    Special Requests   Final    BOTTLES DRAWN AEROBIC AND ANAEROBIC Blood Culture adequate volume Performed at [facility], [address], [place], [place] [address]    Culture   Final    NO GROWTH < 24 HOURS Performed at [facility], [address] [place], [place] [address]    Report Status PENDING  Incomplete  Culture, blood (routine x 2)     Status: None (Preliminary result)   Collection Time: 04/07/22  1:35 PM   Specimen: BLOOD  Result Value Ref Range Status   Specimen Description   Final    BLOOD RIGHT ANTECUBITAL Performed at [facility], [address], [place], [place] [address]    Special Requests   Final    BOTTLES DRAWN AEROBIC AND ANAEROBIC Blood Culture adequate volume Performed at [facility], [address], [place], [place] [address]    Culture   Final    NO GROWTH < 24 HOURS Performed at [facility], [address] [place], [place] [address]    Report  Status PENDING  Incomplete  MRSA Next Gen by PCR, Nasal     Status: None   Collection Time: 04/07/22  7:34 PM   Specimen: Nasal Mucosa; Nasal Swab  Result Value Ref Range Status   MRSA by PCR Next Gen NOT DETECTED NOT DETECTED Final    Comment: (NOTE) The GeneXpert MRSA Assay (FDA approved for NASAL specimens only), is one component of a comprehensive MRSA colonization surveillance program. It is not intended to diagnose MRSA infection nor to guide or monitor treatment for MRSA infections. Test performance is not FDA approved in patients less than 2  years old. Performed at [facility], [address], [place], [place] [address]      Radiology Studies: Portable chest 1 View  Result Date: 04/08/2022 CLINICAL DATA:  [id number] with shortness of breath. EXAM: PORTABLE CHEST 1 VIEW COMPARISON:  PA Lat and chest CT both 04/07/2022 FINDINGS: 7:03 a.m. There is dense consolidation right middle and lower lobes and volume loss. Overall aeration is unchanged. Remaining lungs are clear. There is mild cardiomegaly. No findings of CHF. Aortic atherosclerosis with stable mediastinum. Multiple surgical clips right neck base. Osteopenia and chronic healed fracture proximal right humerus. IMPRESSION: Persistent dense consolidation and volume loss right middle and lower lobes. No change from yesterday's studies. Electronically Signed   By: Almira Bar M.D.   On: 04/08/2022 07:22   CT CHEST ABDOMEN PELVIS W CONTRAST  Result Date: 04/07/2022 CLINICAL DATA:  Cough.  Sepsis. EXAM: CT CHEST, ABDOMEN, AND PELVIS WITH CONTRAST TECHNIQUE: Multidetector CT imaging of the chest, abdomen and pelvis was performed following the standard protocol during bolus administration of intravenous contrast. RADIATION DOSE REDUCTION: This exam was performed according to the departmental dose-optimization program which includes automated exposure control, adjustment of the mA and/or kV according to patient size  and/or use of iterative reconstruction technique. CONTRAST:  OMNIPAQUE IOHEXOL 300 MG/ML  SOLN COMPARISON:  CT the abdomen and pelvis November 12, 2019. Chest x-ray April 07, 2022. Chest x-ray October 19, 2015. FINDINGS: CT CHEST FINDINGS Cardiovascular: Heart size is normal. Calcified atherosclerotic changes are identified in the nonaneurysmal aorta. No dissection. Central pulmonary arteries are normal in caliber. No central pulmonary artery filling defects. Mediastinum/Nodes: The thyroid and esophagus are normal. No pleural or pericardial effusions. Few partially calcified lymph nodes are identified in the mediastinum consistent with previous granulomatous disease. No suspicious adenopathy is identified. The chest wall is normal. Lungs/Pleura: The trachea and mainstem bronchi are are well aerated. There is opacification of the bronchus intermedius and right lower lobe bronchus. No pneumothorax. Emphysematous changes identified in the lungs, particularly the upper lobes. A nodule in the medial right apex on series 7, image 26 is unchanged since June 2017, of no significance. Scattered opacities are identified throughout the right upper lobe, primarily ground glass and nodular in nature. The right middle lobe is largely collapsed with air bronchograms. There is complete collapse of the right lower lobe. No mass identified. Mild atelectasis in the left base. No suspicious nodules, masses, or infiltrates identified in the left lung. Musculoskeletal: No chest wall mass or suspicious bone lesions identified. CT ABDOMEN PELVIS FINDINGS Hepatobiliary: The patient is status post cholecystectomy. No liver masses identified. The portal vein is patent. Pancreas: Unremarkable. No pancreatic ductal dilatation or surrounding inflammatory changes. Spleen: Normal in size without focal abnormality. Adrenals/Urinary Tract: Adrenal glands are unremarkable. Kidneys are normal, without renal calculi, focal lesion, or hydronephrosis.  Bladder is unremarkable. Stomach/Bowel: Stomach is within normal limits. Appendix appears normal. No evidence of bowel wall thickening, distention, or inflammatory changes. Vascular/Lymphatic: Calcified atherosclerotic changes are identified in the nonaneurysmal aorta, iliac vessels, and femoral vessels. No adenopathy. Reproductive: Prostate is unremarkable. Other: No abdominal wall hernia or abnormality. No abdominopelvic ascites. Musculoskeletal: No acute or significant osseous findings. IMPRESSION: 1. There is opacification of the bronchus intermedius and right lower lobe bronchus with complete collapse of the right lower lobe. The right middle lobe is largely collapsed with air bronchograms. The constellation of findings could be due to mucous plugging or aspiration. Recommend short-term follow-up imaging imaging to ensure resolution.  2. Scattered opacities in the right upper lobe are likely infection/pneumonia. Recommend attention on follow-up. 3. Emphysematous changes in the lungs. 4. Calcified atherosclerotic changes in the nonaneurysmal aorta, iliac vessels, and femoral vessels. 5. No other acute abnormalities. 6. Aortic atherosclerosis. Aortic Atherosclerosis (ICD10-I70.0) and Emphysema (ICD10-J43.9). Electronically Signed   By: Gerome Samavid  Williams III M.D.   On: 04/07/2022 14:24   DG Chest 2 View  Result Date: 04/07/2022 CLINICAL DATA:  Hypoxia.  Increased weakness. EXAM: CHEST - 2 VIEW COMPARISON:  One-view chest x-ray 11/11/2019 FINDINGS: Heart is mildly enlarged. Chronic elevation the right hemidiaphragm noted. Mild pulmonary vascular congestion is present. A right pleural effusion is present. Asymmetric right basilar airspace disease is present. No significant edema is present. IMPRESSION: 1. Cardiomegaly and mild pulmonary vascular congestion without frank edema. 2. Right pleural effusion and asymmetric right basilar airspace disease. While this may represent atelectasis, there is concern for  infection. Electronically Signed   By: Marin Robertshristopher  Mattern M.D.   On: 04/07/2022 11:25    Scheduled Meds:  enoxaparin (LOVENOX) injection  40 mg Subcutaneous Q24H   gabapentin  300 mg Oral TID   pantoprazole  40 mg Oral Daily   rosuvastatin  20 mg Oral Daily   sodium chloride HYPERTONIC  4 mL Nebulization BID   Continuous Infusions:  lactated ringers Stopped (04/08/22 0739)   piperacillin-tazobactam (ZOSYN)  IV Stopped (04/08/22 1148)   potassium chloride 10 mEq (04/08/22 1328)     LOS: 1 day   Burnadette PopAmrit Cinthya Bors, MD [facility] P12/18/2023, 2:03 PM

## 2022-04-08 NOTE — Interval H&P Note (Signed)
History and Physical Interval Note:  04/08/2022 3:18 PM  Thomas Raymond  has presented today for surgery, with the diagnosis of collapse lung.  The various methods of treatment have been discussed with the patient and family. After consideration of risks, benefits and other options for treatment, the patient has consented to  Procedure(s): VIDEO BRONCHOSCOPY WITHOUT FLUORO (Right) as a surgical intervention.  The patient's history has been reviewed, patient examined, no change in status, stable for surgery.  I have reviewed the patient's chart and labs.  Questions were answered to the patient's satisfaction.     Comer Locket Vassie Loll

## 2022-04-08 NOTE — Op Note (Signed)
Indication : Right middle lobe and lower lobe consolidation and collapse lung in this 76 year old with muscular dystrophy and weak cough  Written informed consent was obtained prior to the procedure. The risks of the procedure including coughing, bleeding and the small chance of lung puncture requiring chest tube were discussed in great detail. The benefits & alternatives including serial follow up were also discussed.  1 mg versed & 100  mcg fentnayl used in divided doses during the procedure Bronchoscope entered from the left nare. Upper airway nml Vocal cords showed nml appearance & motion. Trachea & bronchial tree examined to the subsegmental level. Thick purulent secretions noted on the right and obstructing right lower lobe . No endobronchial lesions seen.  Secretions were lavaged and suctioned out.  Specimen obtained for BAL  There was moderate weak coughing  during the procedure.  Oxygen saturation dropped lowest to 89% during the procedure and recovered after  Specimen sent: BAL for microbiology including AFB and fungal and cytology  Dustyn Armbrister V.  [phone]

## 2022-04-08 NOTE — Progress Notes (Signed)
NAMELoye Raymond, MRN:  [id number], DOB:  [birth year], LOS: 1 ADMISSION DATE:  04/07/2022, CONSULTATION DATE:  04/08/2022  REFERRING MD:  Donnald Garre, ED P , CHIEF COMPLAINT:  RLL collapse lung   History of Present Illness:  76 year old cambodian man with ALS, wheelchair-bound presented to ED with weakness for 1 week with shortness of breath preceded by sore throat.  Saturating 88% on room air increased to 93% on 2 L He reported fever with chills.  He saw his doctor and was given hydrocodone cough syrup Chest x-ray showed elevated right hemidiaphragm with volume loss on the right with right lower lobe airspace disease versus effusion He was given ceftriaxone, hypokalemia of 2.2 was supplemented CT chest/abdomen/pelvis showed right middle lobe consolidation and collapse of right lower lobe with consolidation, some groundglass in the right upper lobe.  There was emphysema especially in the upper lobes and calcified mediastinal lymph nodes consistent with prior granulomatous disease PCCM consulted to opine about bronchoscopy  He was seen by PCP 01/24/2022 complaining of a cough and given Zyrtec and Nasacort  Pertinent  Medical History  Hypertension Progressive muscular atrophy versus ALS, ongoing for 20 years-wheelchair-bound 2000 -severe pneumonia requiring tracheostomy and PEG  Significant Hospital Events: Including procedures, antibiotic start and stop dates in addition to other pertinent events     Interim History / Subjective:   Complains of coughing. Afebrile. On 4 L nasal cannula  Objective   Blood pressure 136/81, pulse 87, temperature (!) 97.5 F (36.4 C), temperature source Oral, resp. rate (!) 24, SpO2 96 %.        Intake/Output Summary (Last 24 hours) at 04/08/2022 1435 Last data filed at 04/08/2022 1207 Gross per 24 hour  Intake 1762.02 ml  Output --  Net 1762.02 ml   There were no vitals filed for this visit.  Examination: General: Adult man lying in ED stretcher,  no distress on nasal cannula HENT: Mild pallor, no icterus, no JVD Lungs: No accessory muscle use, decreased breath sounds on right Cardiovascular: S1-S2 regular Abdomen: Soft, nontender, no guarding Extremities: Wasting of hands and feet Neuro: Decreased reflexes upper and lower extremities, power left arm 3/5, all others 1-2/5, alert, interactive, nonfocal  Resolved Hospital Problem list     Assessment & Plan:  Acute hypoxic respiratory failure Right middle and lower lobe atelectasis/consolidation possibly related to aspiration pneumonia. There is suggestion of endobronchial lesion in the right lower lobe bronchus which may be aspirated food or secretions.  Lung malignancy is possible  -Proceed with bronchoscopy today.  I have discussed risk and benefits with the patient and his wife  -Swallow evaluation pending-can resume dysphagia diet after procedure -Aggressive tracheobronchial toilet with hypertonic saline nebs, flutter valve and chest PT  Best Practice (right click and "Reselect all SmartList Selections" daily)   Per TR[facility] Code Status:  full code Last date of multidisciplinary goals of care discussion [NA]  Labs   CBC: Recent Labs  Lab 04/07/22 1039 04/08/22 0500  WBC 13.0* 11.1*  HGB 15.2 12.0*  HCT 46.2 37.6*  MCV 90.1 91.7  PLT 321 295     Basic Metabolic Panel: Recent Labs  Lab 04/07/22 1039 04/07/22 1334 04/08/22 0500  NA 137  --  138  K 2.2*  --  2.9*  CL 84*  --  93*  CO2 37*  --  30  GLUCOSE 105*  --  83  BUN 14  --  13  CREATININE 0.58*  --  0.56*  CALCIUM 9.0  --  8.2*  MG  --  2.2  --   PHOS  --  3.3  --     GFR: CrCl cannot be calculated (Unknown ideal weight.). Recent Labs  Lab 04/07/22 1039 04/07/22 1318 04/07/22 1334 04/07/22 1518 04/08/22 0500  PROCALCITON  --   --  0.44  --   --   WBC 13.0*  --   --   --  11.1*  LATICACIDVEN  --  1.1  --  1.2  --      Liver Function Tests: Recent Labs  Lab 04/07/22 1318  AST 28  ALT  17  ALKPHOS 84  BILITOT 1.4*  PROT 8.0  ALBUMIN 2.9*    No results for input(s): "LIPASE", "AMYLASE" in the last 168 hours. No results for input(s): "AMMONIA" in the last 168 hours.  ABG No results found for: "PHART", "PCO2ART", "PO2ART", "HCO3", "TCO2", "ACIDBASEDEF", "O2SAT"   Coagulation Profile: No results for input(s): "INR", "PROTIME" in the last 168 hours.  Cardiac Enzymes: No results for input(s): "CKTOTAL", "CKMB", "CKMBINDEX", "TROPONINI" in the last 168 hours.  HbA1C: Hgb A1c MFr Bld  Date/Time Value Ref Range Status  09/14/2021 01:37 PM 6.0 4.6 - 6.5 % Final    Comment:    Glycemic Control Guidelines for People with Diabetes:Non Diabetic:  <6%Goal of Therapy: <7%Additional Action Suggested:  >8%   10/13/2020 03:10 PM 6.1 4.6 - 6.5 % Final    Comment:    Glycemic Control Guidelines for People with Diabetes:Non Diabetic:  <6%Goal of Therapy: <7%Additional Action Suggested:  >8%     CBG: No results for input(s): "GLUCAP" in the last 168 hours.   Cyril Mourning MD. Tonny Bollman. [facility] Pager : [phone]  If no response to pager , please call [phone] until 7 pm After 7:00 pm call Elink  [phone]   04/08/2022

## 2022-04-08 NOTE — Progress Notes (Signed)
SLP Cancellation Note  Patient Details Name: Thomas Raymond MRN: [id number] DOB: 07-09-1945   Cancelled treatment:       Reason Eval/Treat Not Completed: Other (comment) (Patient is NPO for bronchoscopy today. SLP will f/u after this is completed)   Angela Nevin, MA, CCC-SLP Speech Therapy

## 2022-04-08 NOTE — H&P (View-Only) (Signed)
NAMELoye Raymond, MRN:  [id number], DOB:  [birth year], LOS: 1 ADMISSION DATE:  04/07/2022, CONSULTATION DATE:  04/08/2022  REFERRING MD:  Donnald Garre, ED P , CHIEF COMPLAINT:  RLL collapse lung   History of Present Illness:  76 year old [place] man with ALS, wheelchair-bound presented to ED with weakness for 1 week with shortness of breath preceded by sore throat.  Saturating 88% on room air increased to 93% on 2 L He reported fever with chills.  He saw his doctor and was given hydrocodone cough syrup Chest x-ray showed elevated right hemidiaphragm with volume loss on the right with right lower lobe airspace disease versus effusion He was given ceftriaxone, hypokalemia of 2.2 was supplemented CT chest/abdomen/pelvis showed right middle lobe consolidation and collapse of right lower lobe with consolidation, some groundglass in the right upper lobe.  There was emphysema especially in the upper lobes and calcified mediastinal lymph nodes consistent with prior granulomatous disease PCCM consulted to opine about bronchoscopy  He was seen by PCP 01/24/2022 complaining of a cough and given Zyrtec and Nasacort  Pertinent  Medical History  Hypertension Progressive muscular atrophy versus ALS, ongoing for 20 years-wheelchair-bound 2000 -severe pneumonia requiring tracheostomy and PEG  Significant Hospital Events: Including procedures, antibiotic start and stop dates in addition to other pertinent events     Interim History / Subjective:   Complains of coughing. Afebrile. On 4 L nasal cannula  Objective   Blood pressure 136/81, pulse 87, temperature (!) 97.5 F (36.4 C), temperature source Oral, resp. rate (!) 24, SpO2 96 %.        Intake/Output Summary (Last 24 hours) at 04/08/2022 1435 Last data filed at 04/08/2022 1207 Gross per 24 hour  Intake 1762.02 ml  Output --  Net 1762.02 ml   There were no vitals filed for this visit.  Examination: General: Adult man lying in ED stretcher,  no distress on nasal cannula HENT: Mild pallor, no icterus, no JVD Lungs: No accessory muscle use, decreased breath sounds on right Cardiovascular: S1-S2 regular Abdomen: Soft, nontender, no guarding Extremities: Wasting of hands and feet Neuro: Decreased reflexes upper and lower extremities, power left arm 3/5, all others 1-2/5, alert, interactive, nonfocal  Resolved Hospital Problem list     Assessment & Plan:  Acute hypoxic respiratory failure Right middle and lower lobe atelectasis/consolidation possibly related to aspiration pneumonia. There is suggestion of endobronchial lesion in the right lower lobe bronchus which may be aspirated food or secretions.  Lung malignancy is possible  -Proceed with bronchoscopy today.  I have discussed risk and benefits with the patient and his wife  -Swallow evaluation pending-can resume dysphagia diet after procedure -Aggressive tracheobronchial toilet with hypertonic saline nebs, flutter valve and chest PT  Best Practice (right click and "Reselect all SmartList Selections" daily)   Per [facility] Code Status:  full code Last date of multidisciplinary goals of care discussion [NA]  Labs   CBC: Recent Labs  Lab 04/07/22 1039 04/08/22 0500  WBC 13.0* 11.1*  HGB 15.2 12.0*  HCT 46.2 37.6*  MCV 90.1 91.7  PLT 321 295     Basic Metabolic Panel: Recent Labs  Lab 04/07/22 1039 04/07/22 1334 04/08/22 0500  NA 137  --  138  K 2.2*  --  2.9*  CL 84*  --  93*  CO2 37*  --  30  GLUCOSE 105*  --  83  BUN 14  --  13  CREATININE 0.58*  --  0.56*  CALCIUM 9.0  --  8.2*  MG  --  2.2  --   PHOS  --  3.3  --     GFR: CrCl cannot be calculated (Unknown ideal weight.). Recent Labs  Lab 04/07/22 1039 04/07/22 1318 04/07/22 1334 04/07/22 1518 04/08/22 0500  PROCALCITON  --   --  0.44  --   --   WBC 13.0*  --   --   --  11.1*  LATICACIDVEN  --  1.1  --  1.2  --      Liver Function Tests: Recent Labs  Lab 04/07/22 1318  AST 28  ALT  17  ALKPHOS 84  BILITOT 1.4*  PROT 8.0  ALBUMIN 2.9*    No results for input(s): "LIPASE", "AMYLASE" in the last 168 hours. No results for input(s): "AMMONIA" in the last 168 hours.  ABG No results found for: "PHART", "PCO2ART", "PO2ART", "HCO3", "TCO2", "ACIDBASEDEF", "O2SAT"   Coagulation Profile: No results for input(s): "INR", "PROTIME" in the last 168 hours.  Cardiac Enzymes: No results for input(s): "CKTOTAL", "CKMB", "CKMBINDEX", "TROPONINI" in the last 168 hours.  HbA1C: Hgb A1c MFr Bld  Date/Time Value Ref Range Status  09/14/2021 01:37 PM 6.0 4.6 - 6.5 % Final    Comment:    Glycemic Control Guidelines for People with Diabetes:Non Diabetic:  <6%Goal of Therapy: <7%Additional Action Suggested:  >8%   10/13/2020 03:10 PM 6.1 4.6 - 6.5 % Final    Comment:    Glycemic Control Guidelines for People with Diabetes:Non Diabetic:  <6%Goal of Therapy: <7%Additional Action Suggested:  >8%     CBG: No results for input(s): "GLUCAP" in the last 168 hours.   Cyril Mourning MD. Tonny Bollman. [facility] Pager : [phone]  If no response to pager , please call [phone] until 7 pm After 7:00 pm call Elink  [phone]   04/08/2022

## 2022-04-08 NOTE — ED Notes (Signed)
Pure wick was placed on pt 

## 2022-04-09 ENCOUNTER — Encounter (HOSPITAL_COMMUNITY): Payer: Self-pay | Admitting: Internal Medicine

## 2022-04-09 ENCOUNTER — Inpatient Hospital Stay (HOSPITAL_COMMUNITY): Payer: Medicare HMO

## 2022-04-09 ENCOUNTER — Other Ambulatory Visit: Payer: Self-pay

## 2022-04-09 DIAGNOSIS — T17800D Unspecified foreign body in other parts of respiratory tract causing asphyxiation, subsequent encounter: Secondary | ICD-10-CM | POA: Diagnosis not present

## 2022-04-09 LAB — BASIC METABOLIC PANEL
Anion gap: 9 (ref 5–15)
BUN: 7 mg/dL — ABNORMAL LOW (ref 8–23)
CO2: 29 mmol/L (ref 22–32)
Calcium: 7.9 mg/dL — ABNORMAL LOW (ref 8.9–10.3)
Chloride: 102 mmol/L (ref 98–111)
Creatinine, Ser: 0.41 mg/dL — ABNORMAL LOW (ref 0.61–1.24)
GFR, Estimated: >60 mL/min (ref >60)
Glucose, Bld: 117 mg/dL — ABNORMAL HIGH (ref 70–99)
Potassium: 2.8 mmol/L — ABNORMAL LOW (ref 3.5–5.1)
Sodium: 140 mmol/L (ref 135–145)

## 2022-04-09 LAB — CBC
HCT: 40.4 % (ref 39.0–52.0)
Hemoglobin: 12.6 g/dL — ABNORMAL LOW (ref 13.0–17.0)
MCH: 29 pg (ref 26.0–34.0)
MCHC: 31.2 g/dL (ref 30.0–36.0)
MCV: 92.9 fL (ref 80.0–100.0)
Platelets: 308 10*3/uL (ref 150–400)
RBC: 4.35 MIL/uL (ref 4.22–5.81)
RDW: 13.6 % (ref 11.5–15.5)
WBC: 11.3 10*3/uL — ABNORMAL HIGH (ref 4.0–10.5)
nRBC: 0 % (ref 0.0–0.2)

## 2022-04-09 LAB — MAGNESIUM: Magnesium: 1.7 mg/dL (ref 1.7–2.4)

## 2022-04-09 MED ORDER — MAGNESIUM SULFATE 2 GM/50ML IV SOLN
2.0000 g | Freq: Once | INTRAVENOUS | Status: AC
  Administered 2022-04-09: 2 g via INTRAVENOUS
  Filled 2022-04-09: qty 50

## 2022-04-09 MED ORDER — POTASSIUM CHLORIDE 10 MEQ/100ML IV SOLN
10.0000 meq | INTRAVENOUS | Status: AC
  Administered 2022-04-09 (×4): 10 meq via INTRAVENOUS
  Filled 2022-04-09 (×4): qty 100

## 2022-04-09 MED ORDER — ORAL CARE MOUTH RINSE: 15.0000 mL | OROMUCOSAL | Status: DC | PRN

## 2022-04-09 MED ORDER — SODIUM CHLORIDE 0.9 % IV SOLN: INTRAVENOUS | Status: DC

## 2022-04-09 MED ORDER — POTASSIUM CHLORIDE 20 MEQ PO PACK
40.0000 meq | PACK | ORAL | Status: DC
  Administered 2022-04-09: 40 meq via ORAL
  Filled 2022-04-09: qty 2

## 2022-04-09 NOTE — Progress Notes (Addendum)
Initial Nutrition Assessment  INTERVENTION:   Monitor magnesium, potassium, and phosphorus for at least 3 days, MD to replete as needed, as pt is at risk for refeeding syndrome.   Once PEG ready to use: 1/2 carton (120 ml) Osmolite 1.5 QID via PEG Flush with 60 ml before and after each feeding  Advance slowly as tolerated to goal: -5 cartons Osmolite 1.5 daily -1.5 cartons ( ) BID + 1 carton daily -60 ml free water before and after each feeding -Provides 1775 kcals, 74g protein and 1265 ml H2O -Free water recommendation: 100 ml every 6 hours (400 ml)  -Multivitamin with minerals daily via tube  NUTRITION DIAGNOSIS:   Inadequate oral intake related to dysphagia, ALS as evidenced by NPO status.  GOAL:   Patient will meet greater than or equal to 90% of their needs  MONITOR:   Labs, Weight trends, I & O's, TF tolerance  REASON FOR ASSESSMENT:   Consult Enteral/tube feeding initiation and management  ASSESSMENT:   76 y.o. male with medical history significant of hypertension, ALS with wheelchair-bound /bedbound status presented from home with complaints of cough which started about a week ago. Admitted for the management of aspiration pneumonia.  Underwent bronchoscopy with finding of thick purulent secretions noted on the right and obstructing right lower lobe,no endobronchial lesions seen.  Speech therapy recommending n.p.o. and recommended PEG due to chronic severe aspiration.  12/18: bronchoscopy 12/19: MBS  Patient in room, wife at bedside. RN in room as well. Per RN, plan is now for PEG tube placement, NGT was not advised by SLP. D/c order for small bore tube.  Pt speaks [name]. Utilized interpreter [phone] Pt and wife report he has not been eating well for a week now since his coughing worsened.  Pt states he has had a PEG tube in the past, he prefers bolus/gravity feedings as these are more convenient for his schedule. Prefers his feedings to start at 9am. Pt  states he is familiar with the process. Pt requiring oral suctioning pretty frequently during visit. Interpreter line lost connection and was unable to reconnect so physical exam was not completed. Findings would likely be r/t ALS diagnosis.   Will await PEG placement and order tube feeding once cleared by IR.   Pt was unable to tell me weight history. States he can't weigh himself d/t bedbound status. States his doctor's office does weigh him.   Medications: KLOR-CON, IV Mg sulfate, KCl   Labs reviewed:   Low K  NUTRITION - FOCUSED PHYSICAL EXAM:  Unable to complete, interpreter line down. Pt also with history of ALS.   Diet Order:   Diet Order             Diet NPO time specified  Diet effective now                   EDUCATION NEEDS:   Education needs have been addressed  Skin:  Skin Assessment: Reviewed RN Assessment  Last BM:  12/18  Height:   Ht Readings from Last 1 Encounters:  01/24/22 5\' 1"  (1.549 m)    Weight:   Wt Readings from Last 1 Encounters:  04/08/22 64.2 kg    BMI:  Body mass index is 26.74 kg/m.  Estimated Nutritional Needs:   Kcal:  1600-1800  Protein:  75-90g  Fluid:  1.8L/day  04/10/22, MS, RD, LDN Inpatient Clinical Dietitian Contact information available via Amion

## 2022-04-09 NOTE — Progress Notes (Signed)
Extensive session re: pt's current swallow function, dysphagia and nutrition/pulmonary risk using interpreter. Pt and wife agreeable to pt having PEG tube.  Coralee North interpreter via Ipad used throughout entire session.      Reviewed importance or oral care, intake of water to help with secretion management and aspiration precautions.  HOB not below 30*, oral care importance, pt positioned fully upright for water intake and follow up SLP , ? HH to address dysphagia with plan for repeat OP MBS prior to po intake.   Rolena Infante, MS [facility] SLP Acute Rehab Services Office [phone] Pager [phone]

## 2022-04-09 NOTE — Progress Notes (Signed)
In lieu of BSE, SLP ordered MBS given pt's h/o ALS and right lobe pna - concerning for aspiration. Pt has h/o dysphagia dating back to at least 2001 per chart review.  Using Ipad interpreter, [place], [phone], pt educated and agreeable.   Pt reports food is "too sticky" and "hard to swallow".    Rolena Infante, MS [facility] SLP Acute Rehab Services Office [phone] Pager [phone]

## 2022-04-09 NOTE — Progress Notes (Signed)
Speech Language Pathology Treatment: Dysphagia  Patient Details Name: Thomas Raymond MRN: [id number] DOB: 1945-08-22 Today's Date: 04/09/2022 Time: 5784-6962 SLP Time Calculation (min) (ACUTE ONLY): 25 min  Assessment / Plan / Recommendation Clinical Impression  SLP session focused on education regarding pt's swallow function including reviewing MBS from today, 2015 MBS and normal study comparison. Used [employer] interpreter for the entire session with Coralee North or [place]. Wife present and reports pt has had some weight loss recently over the last five months and that he coughs with intake every few days. Reviewed pt's h/o dysphagia with him requiring several swallows per bite/sips with each bolus at that time.   Pt admits that when he is sick he is weak and his swallow is worse. Further spoke to pt/wife regarding pt's progressive neurological disease *ALS, gross weakness and rapid fatigue preventing adequate nuttrition and potential worsening for tolerance of likely low grade chronic aspiration due this advanced age, progressive decline. In discussion re: potential alt means of nutrition, pt does not want NG tube - but wishes to pursue PEG for nutrition.   Advised to his need for oral care due to secretion aspiration and intake of water encouraged to help manage secretions and decrease disuse muscle atrophy. HOB above 30* at all times advised to decrease aspiraiton. Pt will benefit from follow up SLP at next venue of care to strengthen swallow musculature and mitigate aspiration/dysphagia and repeat MBS indicated prior to po administration due to sensormotor deficits.   Will follow in house for dysphagia management - using teach back for care plan, pt and wife educated and agreeable to plan.   Note palliative referral pending- thank you so much!      HPI HPI: 76 year old [place] man with ALS, wheelchair-bound presented to ED with weakness for 1 week with shortness of breath preceded by sore throat.  Saturating  88% on room air increased to 93% on 2 L  He reported fever with chills.  He saw his doctor and was given hydrocodone cough syrup  Chest x-ray showed elevated right hemidiaphragm with volume loss on the right with right lower lobe airspace disease versus effusion  He was given ceftriaxone, hypokalemia of 2.2 was supplemented  CT chest/abdomen/pelvis showed right middle lobe consolidation and collapse of right lower lobe with consolidation, some groundglass in the right upper lobe.  There was emphysema especially in the upper lobes and calcified mediastinal lymph nodes consistent with prior granulomatous disease  PCCM consulted to opine about bronchoscopy  Swallow eval ordered.  Pt has h/o dysphagia h/o mod-severe dysphagia, mbs 2001 penetration thin/nectar - chin tuck prevent; vallecular retention - multiple swallows decreases, 2015 most recent mbs - chin tuck not help, retention- expectorate to clear effective.  Last diet recommendation was dys3/thin with strategies including multiple swallows and expectoration.      SLP Plan  Continue with current plan of care      Recommendations for follow up therapy are one component of a multi-disciplinary discharge planning process, led by the attending physician.  Recommendations may be updated based on patient status, additional functional criteria and insurance authorization.    Recommendations  Diet recommendations: Thin liquid (thin water) Medication Administration: Via alternative means Compensations: Slow rate;Small sips/bites;Multiple dry swallows after each bite/sip;Other (Comment) Postural Changes and/or Swallow Maneuvers: Seated upright 90 degrees;Upright 30-60 min after meal                Oral Care Recommendations: Oral care prior to ice chip/H20 Follow Up Recommendations: Home health SLP  Assistance recommended at discharge: Frequent or constant Supervision/Assistance SLP Visit Diagnosis: Dysphagia, oropharyngeal phase (R13.12);Dysphagia,  pharyngoesophageal phase (R13.14) Plan: Continue with current plan of care          Rolena Infante, MS [facility] SLP Acute Rehab Services Office [phone] Pager [phone]  Chales Abrahams  04/09/2022, 2:23 PM

## 2022-04-09 NOTE — Progress Notes (Signed)
Modified Barium Swallow Progress Note  Patient Details  Name: Thomas Raymond MRN: [id number] Date of Birth: 1945/05/08  Today's Date: 04/09/2022  Modified Barium Swallow completed.  Full report located under Chart Review in the Imaging Section.  Brief recommendations include the following:  Clinical Impression  Patient presents with severe pharyngeal-cervical esopahgeal dysphagia due to his ALS.  Very poor pharyngeal motilitg- impaired laryngeal elevation/closure and pharyngeal contraction/tongue base retractions results in gross pharyngeal retention that mixes with existing secretions.  He requires up to 6-7 swallows to partially clear small amount of pharyngeal retention.   Laryngeal penetration of thin and nectar during and after swallow noted with retentionin pyriform sinus more than vallecular space.  Initially pt sensed penetration resulting in him clearing his throat and re-swallowing/expectorating.   Pharyngeal clearance is much more compromised with puree than liquids - at vallecular space more with pudding.  Pt fatigued quickly during testing after just few boluses - causing worsening weakness with poor expecoration abilities and worsening pharyngeal clearance.  Secretions retained in pharynx that mixed with barium were not cleared nor consistently sensed. Pt denies issues with throat clearing with intake prior to intake stating this worsening dysphagia is due to his current illness. Prognosis for swallow to return to functional level is guarded given his ALS however given his report of signficant worsening, he may benefit from consideration for short term alternative means of nutrition - allowing thin water by mouth and repeat MBS as pt medically improves.  Upon review of prior MBS report from 2015, *images not available*, pt with retention (requiring up to 5-6 swallows, frequent throat clearing with liquids and laryngeal penetration at that time.  Thus suspect worsening of chronic dysphagia - and  possible impaired tolerance of suspected low grade chronic aspiration due to his advanced age, progression of ALS, and recent respiratory illness.  Pt at this time is aspirating secretions thus oral care    Swallow Evaluation Recommendations       SLP Diet Recommendations: NPO;Ice chips PRN after oral care (ice chips and water after oral care)   Liquid Administration via: Straw   Medication Administration: Via alternative means   Supervision: Full assist for feeding   Compensations: Slow rate;Small sips/bites;Multiple dry swallows after each bite/sip;Other (Comment) ("Hock" and re-swallow or expectorate)       Oral Care Recommendations: Oral care QID   Other Recommendations: Have oral suction available  Rolena Infante, MS [facility] SLP Acute Rehab Services Office [phone] Pager [phone]   Chales Abrahams 04/09/2022,9:53 AM

## 2022-04-09 NOTE — Progress Notes (Signed)
NAMEIrving Raymond, MRN:  [id number], DOB:  1945/11/05, LOS: 2 ADMISSION DATE:  04/07/2022, CONSULTATION DATE:  04/09/2022  REFERRING MD:  Donnald Garre, ED P , CHIEF COMPLAINT:  RLL collapse lung   History of Present Illness:  76 year old [place] man with ALS, wheelchair-bound presented to ED with weakness for 1 week with shortness of breath preceded by sore throat.  Saturating 88% on room air increased to 93% on 2 L He reported fever with chills.  He saw his doctor and was given hydrocodone cough syrup Chest x-ray showed elevated right hemidiaphragm with volume loss on the right with right lower lobe airspace disease versus effusion He was given ceftriaxone, hypokalemia of 2.2 was supplemented CT chest/abdomen/pelvis showed right middle lobe consolidation and collapse of right lower lobe with consolidation, some groundglass in the right upper lobe.  There was emphysema especially in the upper lobes and calcified mediastinal lymph nodes consistent with prior granulomatous disease PCCM consulted to opine about bronchoscopy  He was seen by PCP 01/24/2022 complaining of a cough and given Zyrtec and Nasacort  Pertinent  Medical History  Hypertension Progressive muscular atrophy versus ALS, ongoing for 20 years-wheelchair-bound 2000 -severe pneumonia requiring tracheostomy and PEG  Significant Hospital Events: Including procedures, antibiotic start and stop dates in addition to other pertinent events     Interim History / Subjective:   Feels a little better, still needs O2  Objective   Blood pressure 111/60, pulse 72, temperature 99.2 F (37.3 C), temperature source Oral, resp. rate 16, weight 64.2 kg, SpO2 97 %.        Intake/Output Summary (Last 24 hours) at 04/09/2022 1659 Last data filed at 04/09/2022 0800 Gross per 24 hour  Intake 766.82 ml  Output 350 ml  Net 416.82 ml    Filed Weights   04/08/22 1459  Weight: 64.2 kg    Examination: General: Adult man lying in bed, no  distress on nasal cannula HENT: Mild pallor, no icterus, no JVD Lungs: No accessory muscle use, decreased breath sounds on right Cardiovascular: S1-S2 regular Abdomen: Soft, nontender, no guarding Extremities: Wasting of hands and feet  Resolved Hospital Problem list     Assessment & Plan:  Acute hypoxic respiratory failure Right middle and lower lobe atelectasis/consolidation likely related to aspiration pneumonia. There is suggestion of endobronchial lesion in the right lower lobe bronchus which likely is aspirated food or secretions.  Purulent secretions on bronch without mention of endobronchial tumor. Weak cough making things worse. -s/p with bronchoscopy 12/18, no cell counts, culture in progress -Continue antibiotics -Aggressive tracheobronchial toilet with hypertonic saline nebs, flutter valve and chest PT --O2 sat goal 88%, wean as able, may need d/c with O2  PCCM will sign off  Best Practice (right click and "Reselect all SmartList Selections" daily)   Per TR[facility] Code Status:  full code Last date of multidisciplinary goals of care discussion [NA]  Labs   CBC: Recent Labs  Lab 04/07/22 1039 04/08/22 0500 04/09/22 0632  WBC 13.0* 11.1* 11.3*  HGB 15.2 12.0* 12.6*  HCT 46.2 37.6* 40.4  MCV 90.1 91.7 92.9  PLT 321 295 308     Basic Metabolic Panel: Recent Labs  Lab 04/07/22 1039 04/07/22 1334 04/08/22 0500 04/09/22 0632  NA 137  --  138 140  K 2.2*  --  2.9* 2.8*  CL 84*  --  93* 102  CO2 37*  --  30 29  GLUCOSE 105*  --  83 117*  BUN 14  --  13 7*  CREATININE 0.58*  --  0.56* 0.41*  CALCIUM 9.0  --  8.2* 7.9*  MG  --  2.2  --  1.7  PHOS  --  3.3  --   --     GFR: Estimated Creatinine Clearance: 62.5 mL/min (A) (by C-G formula based on SCr of 0.41 mg/dL (L)). Recent Labs  Lab 04/07/22 1039 04/07/22 1318 04/07/22 1334 04/07/22 1518 04/08/22 0500 04/09/22 0632  PROCALCITON  --   --  0.44  --   --   --   WBC 13.0*  --   --   --  11.1* 11.3*   LATICACIDVEN  --  1.1  --  1.2  --   --      Liver Function Tests: Recent Labs  Lab 04/07/22 1318  AST 28  ALT 17  ALKPHOS 84  BILITOT 1.4*  PROT 8.0  ALBUMIN 2.9*    No results for input(s): "LIPASE", "AMYLASE" in the last 168 hours. No results for input(s): "AMMONIA" in the last 168 hours.  ABG No results found for: "PHART", "PCO2ART", "PO2ART", "HCO3", "TCO2", "ACIDBASEDEF", "O2SAT"   Coagulation Profile: No results for input(s): "INR", "PROTIME" in the last 168 hours.  Cardiac Enzymes: No results for input(s): "CKTOTAL", "CKMB", "CKMBINDEX", "TROPONINI" in the last 168 hours.  HbA1C: Hgb A1c MFr Bld  Date/Time Value Ref Range Status  09/14/2021 01:37 PM 6.0 4.6 - 6.5 % Final    Comment:    Glycemic Control Guidelines for People with Diabetes:Non Diabetic:  <6%Goal of Therapy: <7%Additional Action Suggested:  >8%   10/13/2020 03:10 PM 6.1 4.6 - 6.5 % Final    Comment:    Glycemic Control Guidelines for People with Diabetes:Non Diabetic:  <6%Goal of Therapy: <7%Additional Action Suggested:  >8%     CBG: No results for input(s): "GLUCAP" in the last 168 hours.   Karren Burly, MD [facility] See Amion for contact info  If no response to pager , please call [phone] until 7 pm After 7:00 pm call Elink  [phone]   04/09/2022

## 2022-04-09 NOTE — Consult Note (Addendum)
Chief Complaint: Patient was seen in consultation today for g tube placement Chief Complaint  Patient presents with   Weakness   Cough   at the request of Renford Dills, Amrit   Referring Physician(s): Burnadette Pop  Supervising Physician: Simonne Come  Patient Status: [facility] - In-pt  History of Present Illness: Thomas Raymond is a 76 y.o. male with PMHs of HTN, HLD, ALS and aspiration PNA who present for G tube placement.   Patient came to ED on 04/07/22 due to CC of cough, found to be in acute hypoxic respiratory failure: Secondary to aspiration pneumonia as well as severe hypokalemia.  Patient has been evaluated by speech pathologist and he was deemed high risk for aspiration, parenteral nutrition support was recommended.    IR was requested for image guided G tube placement, case reviewed by Dr. Grace Isaac and anatomy is amenable for percutaneous G tube placement.  Patient seen in room. Patient laying in bed, not in acute distress, spouse at bedside. Patient speaks [name], audio interpreter was utilized during interview.  He states that he had G tube in 2000.  Reports occasional cough and SOB.  Denise headache, fever, chills,  chest pain, abdominal pain, nausea ,vomiting, and bleeding.   Past Medical History:  Diagnosis Date   ALLERGIC RHINITIS    ALS    HYPERLIPIDEMIA    HYPERTENSION    Impaired glucose tolerance 02/17/2011   LOW BACK PAIN    VERTIGO     Past Surgical History:  Procedure Laterality Date   CHOLECYSTECTOMY N/A 11/15/2019   Procedure: LAPAROSCOPIC CHOLECYSTECTOMY;  Surgeon: Abigail Miyamoto, MD;  Location: [facility] OR;  Service: General;  Laterality: N/A;   COMPRESSION HIP SCREW Left 02/07/2014   Procedure: IM Nail;  Surgeon: Harvie Junior, MD;  Location: [facility] ORS;  Service: Orthopedics;  Laterality: Left;   ERCP N/A 11/14/2019   Procedure: ENDOSCOPIC RETROGRADE CHOLANGIOPANCREATOGRAPHY (ERCP);  Surgeon: Hilarie Fredrickson, MD;  Location[facility] ENDOSCOPY;  Service:  Endoscopy;  Laterality: N/A;   IR GENERIC HISTORICAL  11/21/2015   IR RADIOLOGIST EVAL & MGMT 11/21/2015 Oley Balm, MD [facility] INTERV RAD   Lipoma removal  2003   Percutaneous gastrostomy tube repalcement     04/1999 and removed 07/1999   REMOVAL OF STONES  11/14/2019   Procedure: REMOVAL OF STONES;  Surgeon: Hilarie Fredrickson, MD;  Location: [facility] ENDOSCOPY;  Service: Endoscopy;;   SPHINCTEROTOMY  11/14/2019   Procedure: Dennison Mascot;  Surgeon: Hilarie Fredrickson, MD;  Location: [facility] ENDOSCOPY;  Service: Endoscopy;;   TRACHEOSTOMY      Allergies: Patient has no known allergies.  Medications: Prior to Admission medications   Medication Sig Start Date End Date Taking? Authorizing Provider  acetaminophen (TYLENOL) 325 MG tablet Take 2 tablets (650 mg total) by mouth every 6 (six) hours as needed for mild pain or moderate pain (or temp > 100). 11/16/19  Yes Leroy Sea, MD  amLODipine (NORVASC) 10 MG tablet Take 1 tablet (10 mg total) by mouth daily. take 1 tablet (10 mg total) by mouth daily. 09/14/21  Yes Corwin Levins, MD  cetirizine (ZYRTEC) 10 MG tablet Take 1 tablet (10 mg total) by mouth daily as needed. Patient taking differently: Take 10 mg by mouth daily as needed for allergies. 09/14/21  Yes Corwin Levins, MD  gabapentin (NEURONTIN) 300 MG capsule Take 1 capsule (300 mg total) by mouth 3 (three) times daily. 09/14/21  Yes Corwin Levins, MD  hydrochlorothiazide (MICROZIDE) 12.5 MG capsule Take 1  capsule (12.5 mg total) by mouth daily. 09/14/21  Yes Corwin Levins, MD  HYDROcodone bit-homatropine (HYDROMET) 5-1.5 MG/5ML syrup Take 5 mLs by mouth every 6 (six) hours as needed for cough.   Yes [provider]  meclizine (ANTIVERT) 12.5 MG tablet TAKE 1 TABLET BY MOUTH 3 TIMES A DAY AS NEEDED FOR DIZZINESS Patient taking differently: Take 12.5 mg by mouth 3 (three) times daily as needed for dizziness. 09/14/21  Yes Corwin Levins, MD  metoprolol succinate (TOPROL-XL) 50 MG 24 hr tablet Take 1  tablet (50 mg total) by mouth daily. TAKE WITH OR IMMEDIATELY FOLLOWING A MEAL. 09/14/21  Yes Corwin Levins, MD  omeprazole (PRILOSEC) 20 MG capsule Take 1 capsule (20 mg total) by mouth daily. Must keep 09/14/21 appt for future refills Patient taking differently: Take 20 mg by mouth daily as needed (acid reflux). Must keep 09/14/21 appt for future refills 09/14/21  Yes Corwin Levins, MD  rosuvastatin (CRESTOR) 20 MG tablet Take 1 tablet (20 mg total) by mouth daily. 09/14/21  Yes Corwin Levins, MD  triamcinolone (NASACORT) 55 MCG/ACT AERO nasal inhaler Place 2 sprays into the nose daily. Patient not taking: Reported on 04/09/2022 01/24/22   Corwin Levins, MD     Family History  Problem Relation Age of Onset   Other Mother        Deceased   Other Father        Deceased   Other Son 2       x 2 starvation/illness from refugee [place]   Other Daughter 2       x 2 starvation/illness from refugee camp    Social History   Socioeconomic History   Marital status: Widowed    Spouse name: Not on file   Number of children: 4 D   Years of education: Not on file   Highest education level: Not on file  Occupational History   Occupation: disabled  Tobacco Use   Smoking status: Never   Smokeless tobacco: Never  Substance and Sexual Activity   Alcohol use: No   Drug use: No   Sexual activity: Never  Other Topics Concern   Not on file  Social History Narrative   He lives with wife.   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  He went on disability in 2000 after diagnosis of ALS.   He moved from [place] in 1985.   Social Determinants of Health   Financial Resource Strain: Not on file  Food Insecurity: Not on file  Transportation Needs: Not on file  Physical Activity: Not on file  Stress: Not on file  Social Connections: Not on file     Review of Systems: A 12 point ROS discussed and pertinent positives are indicated in the HPI above.  All other systems are  negative.  Vital Signs: BP 111/60 (BP Location: Left Arm)   Pulse 72   Temp 99.2 F (37.3 C) (Oral)   Resp 16   Wt 141 lb 8.6 oz (64.2 kg)   SpO2 97%   BMI 26.74 kg/m    Physical Exam Vitals reviewed.  Constitutional:      General: He is not in acute distress.    Comments: Frail   HENT:     Head: Normocephalic.     Mouth/Throat:     Mouth: Mucous membranes are moist.     Pharynx: Oropharynx is clear.  Cardiovascular:     Rate and Rhythm: Normal  rate and regular rhythm.     Heart sounds: Normal heart sounds.  Pulmonary:     Effort: Pulmonary effort is normal.     Breath sounds: Normal breath sounds.  Abdominal:     General: Abdomen is flat. Bowel sounds are normal.     Palpations: Abdomen is soft.  Musculoskeletal:     Cervical back: Neck supple.  Skin:    General: Skin is warm and dry.     Coloration: Skin is not jaundiced or pale.     Comments: Well healed scar on RUQ, pt state it is from previous G tube   Neurological:     Mental Status: He is alert and oriented to person, place, and time.  Psychiatric:        Behavior: Behavior normal.     Comments: Slightly agitated, does not engage interview well      MD Evaluation Airway: WNL Heart: WNL Abdomen: WNL Chest/ Lungs: WNL Chest/ lungs comments: decerased BS RT ASA  Classification: 3 Mallampati/Airway Score: One  Imaging: DG Swallowing Func-Speech Pathology  Result Date: 04/09/2022 Table formatting from the original result was not included. Objective Swallowing Evaluation: Type of Study: MBS-Modified Barium Swallow Study  Patient Details Name: Thomas Raymond MRN: [id number] Date of Birth: October 31, 1945 Today's Date: 04/09/2022 Time: No data recorded-No data recorded No data recorded Past Medical History: Past Medical History: Diagnosis Date  ALLERGIC RHINITIS   ALS   HYPERLIPIDEMIA   HYPERTENSION   Impaired glucose tolerance 02/17/2011  LOW BACK PAIN   VERTIGO  Past Surgical History: Past Surgical History: Procedure  Laterality Date  CHOLECYSTECTOMY N/A 11/15/2019  Procedure: LAPAROSCOPIC CHOLECYSTECTOMY;  Surgeon: Abigail Miyamoto, MD;  Location: [facility] OR;  Service: General;  Laterality: N/A;  COMPRESSION HIP SCREW Left 02/07/2014  Procedure: IM Nail;  Surgeon: Harvie Junior, MD;  Location: [facility] ORS;  Service: Orthopedics;  Laterality: Left;  ERCP N/A 11/14/2019  Procedure: ENDOSCOPIC RETROGRADE CHOLANGIOPANCREATOGRAPHY (ERCP);  Surgeon: Hilarie Fredrickson, MD;  Location: [name] ENDOSCOPY;  Service: Endoscopy;  Laterality: N/A;  IR GENERIC HISTORICAL  11/21/2015  IR RADIOLOGIST EVAL & MGMT 11/21/2015 Oley Balm, MD [facility] INTERV RAD  Lipoma removal  2003  Percutaneous gastrostomy tube repalcement    04/1999 and removed 07/1999  REMOVAL OF STONES  11/14/2019  Procedure: REMOVAL OF STONES;  Surgeon: Hilarie Fredrickson, MD;  Location: [facility] ENDOSCOPY;  Service: Endoscopy;;  Dennison Mascot  11/14/2019  Procedure: Dennison Mascot;  Surgeon: Hilarie Fredrickson, MD;  Location[facility] ENDOSCOPY;  Service: Endoscopy;;  TRACHEOSTOMY   HPI: 76 year old [place] man with ALS, wheelchair-bound presented to ED with weakness for 1 week with shortness of breath preceded by sore throat.  Saturating 88% on room air increased to 93% on 2 L  He reported fever with chills.  He saw his doctor and was given hydrocodone cough syrup  Chest x-ray showed elevated right hemidiaphragm with volume loss on the right with right lower lobe airspace disease versus effusion  He was given ceftriaxone, hypokalemia of 2.2 was supplemented  CT chest/abdomen/pelvis showed right middle lobe consolidation and collapse of right lower lobe with consolidation, some groundglass in the right upper lobe.  There was emphysema especially in the upper lobes and calcified mediastinal lymph nodes consistent with prior granulomatous disease  PCCM consulted to opine about bronchoscopy  Swallow eval ordered.  Pt has h/o dysphagia h/o mod-severe dysphagia, mbs 2001 penetration thin/nectar - chin tuck prevent;  vallecular retention - multiple swallows decreases, 2015 most recent mbs - chin tuck not  help, retention- expectorate to clear effective.  Last diet recommendation was dys3/thin with strategies including multiple swallows and expectoration.  Subjective: pt awake in chair  Recommendations for follow up therapy are one component of a multi-disciplinary discharge planning process, led by the attending physician.  Recommendations may be updated based on patient status, additional functional criteria and insurance authorization. Assessment / Plan / Recommendation   04/09/2022   9:28 AM Clinical Impressions Clinical Impression Patient presents with profound pharyngeal-cervical esopahgeal dysphagia due to his ALS.  Very poor pharyngeal motilitg- impaired laryngeal elevation/closure and pharyngeal contraction/tongue base retractions results in gross pharyngeal retention that mixes with existing secretions.  He requires up to 6-7 swallows to partially clear small amount of pharyngeal retention.   Laryngeal penetration of thin and nectar during and after swallow noted with retentionin pyriform sinus more than vallecular space.  Initially pt sensed penetration resulting in him clearing his throat and re-swallowing/expectorating.   Pharyngeal clearance is much more compromised with puree than liquids - at vallecular space more with pudding.  Pt fatigued quickly during testing after just few boluses - causing worsening weakness with poor expecoration abilities and worsening pharyngeal clearance.  Secretions retained in pharynx that mixed with barium were not cleared nor consistently sensed. Pt denies issues with throat clearing with intake prior to intake stating this worsening dysphagia is due to his current illness. Prognosis for swallow to return to functional level is guarded given his ALS however given his report of signficant worsening, he may benefit from consideration for short term alternative means of nutrition -  allowing thin water by mouth and repeat MBS as pt medically improves.  Upon review of prior MBS report from 2015, *images not available*, pt with retention and frequent throat clearing and laryngeal penetration at that time - thus suspect worsening of chronic dysphagia - and possible impaired tolerance of suspected low grade chronic aspiration due to his advanced age and acute illness.  SLP Visit Diagnosis Dysphagia, oropharyngeal phase (R13.12);Dysphagia, pharyngoesophageal phase (R13.14) Impact on safety and function Severe aspiration risk;Risk for inadequate nutrition/hydration     04/09/2022   9:28 AM Treatment Recommendations Treatment Recommendations Therapy as outlined in treatment plan below     04/09/2022   9:52 AM Prognosis Prognosis for Safe Diet Advancement Guarded Barriers to Reach Goals Severity of deficits;Time post onset   04/09/2022   9:28 AM Diet Recommendations SLP Diet Recommendations NPO;Ice chips PRN after oral care Liquid Administration via Straw Medication Administration Via alternative means Compensations Slow rate;Small sips/bites;Multiple dry swallows after each bite/sip;Other (Comment)     04/09/2022   9:28 AM Other Recommendations Oral Care Recommendations Oral care QID Other Recommendations Have oral suction available Functional Status Assessment Patient has had a recent decline in their functional status and demonstrates the ability to make significant improvements in function in a reasonable and predictable amount of time.   04/09/2022   9:28 AM Frequency and Duration  Speech Therapy Frequency (ACUTE ONLY) min 1 x/week Treatment Duration 1 week     04/09/2022   9:20 AM Oral Phase Oral Phase Impaired Oral - Nectar Teaspoon Piecemeal swallowing;Weak lingual manipulation Oral - Nectar Straw Piecemeal swallowing;Weak lingual manipulation Oral - Thin Teaspoon Piecemeal swallowing;Weak lingual manipulation Oral - Thin Straw Piecemeal swallowing;Weak lingual manipulation Oral - Puree  Piecemeal swallowing;Weak lingual manipulation;Lingual pumping Oral - Mech Soft NT    04/09/2022   9:25 AM Pharyngeal Phase Pharyngeal Phase Impaired Pharyngeal- Nectar Teaspoon Reduced laryngeal elevation;Reduced airway/laryngeal closure;Reduced tongue base retraction;Pharyngeal residue -  valleculae;Pharyngeal residue - pyriform;Trace aspiration;Penetration/Aspiration during swallow;Penetration/Apiration after swallow Pharyngeal Material enters airway, remains ABOVE vocal cords then ejected out Pharyngeal- Nectar Straw Reduced laryngeal elevation;Reduced airway/laryngeal closure;Reduced tongue base retraction;Penetration/Aspiration before swallow;Pharyngeal residue - pyriform;Pharyngeal residue - valleculae;Trace aspiration;Penetration/Aspiration during swallow;Pharyngeal residue - posterior pharnyx;Penetration/Apiration after swallow Pharyngeal Material enters airway, remains ABOVE vocal cords and not ejected out;Material enters airway, CONTACTS cords and then ejected out Pharyngeal- Thin Teaspoon Reduced laryngeal elevation;Reduced airway/laryngeal closure;Reduced tongue base retraction;Penetration/Aspiration during swallow;Pharyngeal residue - cp segment;Pharyngeal residue - posterior pharnyx;Pharyngeal residue - pyriform;Pharyngeal residue - valleculae;Reduced anterior laryngeal mobility;Penetration/Apiration after swallow Pharyngeal Material enters airway, remains ABOVE vocal cords and not ejected out;Material enters airway, CONTACTS cords and then ejected out Pharyngeal- Thin Straw Reduced pharyngeal peristalsis;Reduced epiglottic inversion;Reduced anterior laryngeal mobility;Reduced laryngeal elevation;Reduced airway/laryngeal closure;Reduced tongue base retraction;Pharyngeal residue - valleculae;Pharyngeal residue - pyriform Pharyngeal Material enters airway, remains ABOVE vocal cords and not ejected out Pharyngeal- Puree Reduced pharyngeal peristalsis;Reduced epiglottic inversion;Reduced anterior laryngeal  mobility;Reduced laryngeal elevation;Reduced airway/laryngeal closure;Reduced tongue base retraction;Pharyngeal residue - valleculae;Pharyngeal residue - pyriform Pharyngeal Material does not enter airway Pharyngeal Comment retention spilled to distal phayrnx after swallow attempts. pharyngeal retention was propelled into oral cavity with verbal cues to allow SLP to orally suction to clear    04/09/2022   9:27 AM Cervical Esophageal Phase  Cervical Esophageal Phase Impaired Cervical Esophageal Comment impaired UES opening resulting gross retention - across all consistencies Rolena Infante, MS [facility] SLP Acute Rehab Services Office [phone] Pager [phone] Chales Abrahams 04/09/2022, 12:29 PM                     Portable chest 1 View  Result Date: 04/08/2022 CLINICAL DATA:  [id number] with shortness of breath. EXAM: PORTABLE CHEST 1 VIEW COMPARISON:  PA Lat and chest CT both 04/07/2022 FINDINGS: 7:03 a.m. There is dense consolidation right middle and lower lobes and volume loss. Overall aeration is unchanged. Remaining lungs are clear. There is mild cardiomegaly. No findings of CHF. Aortic atherosclerosis with stable mediastinum. Multiple surgical clips right neck base. Osteopenia and chronic healed fracture proximal right humerus. IMPRESSION: Persistent dense consolidation and volume loss right middle and lower lobes. No change from yesterday's studies. Electronically Signed   By: Almira Bar M.D.   On: 04/08/2022 07:22   CT CHEST ABDOMEN PELVIS W CONTRAST  Result Date: 04/07/2022 CLINICAL DATA:  Cough.  Sepsis. EXAM: CT CHEST, ABDOMEN, AND PELVIS WITH CONTRAST TECHNIQUE: Multidetector CT imaging of the chest, abdomen and pelvis was performed following the standard protocol during bolus administration of intravenous contrast. RADIATION DOSE REDUCTION: This exam was performed according to the departmental dose-optimization program which includes automated exposure control, adjustment of the mA and/or kV  according to patient size and/or use of iterative reconstruction technique. CONTRAST:  OMNIPAQUE IOHEXOL 300 MG/ML  SOLN COMPARISON:  CT the abdomen and pelvis November 12, 2019. Chest x-ray April 07, 2022. Chest x-ray October 19, 2015. FINDINGS: CT CHEST FINDINGS Cardiovascular: Heart size is normal. Calcified atherosclerotic changes are identified in the nonaneurysmal aorta. No dissection. Central pulmonary arteries are normal in caliber. No central pulmonary artery filling defects. Mediastinum/Nodes: The thyroid and esophagus are normal. No pleural or pericardial effusions. Few partially calcified lymph nodes are identified in the mediastinum consistent with previous granulomatous disease. No suspicious adenopathy is identified. The chest wall is normal. Lungs/Pleura: The trachea and mainstem bronchi are are well aerated. There is opacification of the bronchus intermedius and right lower lobe bronchus. No pneumothorax. Emphysematous changes  identified in the lungs, particularly the upper lobes. A nodule in the medial right apex on series 7, image 26 is unchanged since June 2017, of no significance. Scattered opacities are identified throughout the right upper lobe, primarily ground glass and nodular in nature. The right middle lobe is largely collapsed with air bronchograms. There is complete collapse of the right lower lobe. No mass identified. Mild atelectasis in the left base. No suspicious nodules, masses, or infiltrates identified in the left lung. Musculoskeletal: No chest wall mass or suspicious bone lesions identified. CT ABDOMEN PELVIS FINDINGS Hepatobiliary: The patient is status post cholecystectomy. No liver masses identified. The portal vein is patent. Pancreas: Unremarkable. No pancreatic ductal dilatation or surrounding inflammatory changes. Spleen: Normal in size without focal abnormality. Adrenals/Urinary Tract: Adrenal glands are unremarkable. Kidneys are normal, without renal calculi, focal  lesion, or hydronephrosis. Bladder is unremarkable. Stomach/Bowel: Stomach is within normal limits. Appendix appears normal. No evidence of bowel wall thickening, distention, or inflammatory changes. Vascular/Lymphatic: Calcified atherosclerotic changes are identified in the nonaneurysmal aorta, iliac vessels, and femoral vessels. No adenopathy. Reproductive: Prostate is unremarkable. Other: No abdominal wall hernia or abnormality. No abdominopelvic ascites. Musculoskeletal: No acute or significant osseous findings. IMPRESSION: 1. There is opacification of the bronchus intermedius and right lower lobe bronchus with complete collapse of the right lower lobe. The right middle lobe is largely collapsed with air bronchograms. The constellation of findings could be due to mucous plugging or aspiration. Recommend short-term follow-up imaging imaging to ensure resolution. 2. Scattered opacities in the right upper lobe are likely infection/pneumonia. Recommend attention on follow-up. 3. Emphysematous changes in the lungs. 4. Calcified atherosclerotic changes in the nonaneurysmal aorta, iliac vessels, and femoral vessels. 5. No other acute abnormalities. 6. Aortic atherosclerosis. Aortic Atherosclerosis (ICD10-I70.0) and Emphysema (ICD10-J43.9). Electronically Signed   By: Gerome Samavid  Williams III M.D.   On: 04/07/2022 14:24   DG Chest 2 View  Result Date: 04/07/2022 CLINICAL DATA:  Hypoxia.  Increased weakness. EXAM: CHEST - 2 VIEW COMPARISON:  One-view chest x-ray 11/11/2019 FINDINGS: Heart is mildly enlarged. Chronic elevation the right hemidiaphragm noted. Mild pulmonary vascular congestion is present. A right pleural effusion is present. Asymmetric right basilar airspace disease is present. No significant edema is present. IMPRESSION: 1. Cardiomegaly and mild pulmonary vascular congestion without frank edema. 2. Right pleural effusion and asymmetric right basilar airspace disease. While this may represent atelectasis,  there is concern for infection. Electronically Signed   By: Marin Robertshristopher  Mattern M.D.   On: 04/07/2022 11:25    Labs:  CBC: Recent Labs    09/14/21 1337 04/07/22 1039 04/08/22 0500 04/09/22 0632  WBC 7.7 13.0* 11.1* 11.3*  HGB 14.7 15.2 12.0* 12.6*  HCT 44.4 46.2 37.6* 40.4  PLT 259.0 321 295 308    COAGS: No results for input(s): "INR", "APTT" in the last 8760 hours.  BMP: Recent Labs    09/14/21 1337 04/07/22 1039 04/08/22 0500 04/09/22 0632  NA 144 137 138 140  K 4.3 2.2* 2.9* 2.8*  CL 105 84* 93* 102  CO2 28 37* 30 29  GLUCOSE 69* 105* 83 117*  BUN 19 14 13  7*  CALCIUM 9.8 9.0 8.2* 7.9*  CREATININE 0.36* 0.58* 0.56* 0.41*  GFRNONAA  --  >60 >60 >60    LIVER FUNCTION TESTS: Recent Labs    09/14/21 1337 04/07/22 1318  BILITOT 0.4 1.4*  AST 26 28  ALT 26 17  ALKPHOS 67 84  PROT 8.0 8.0  ALBUMIN 4.3 2.9*  TUMOR MARKERS: No results for input(s): "AFPTM", "CEA", "CA199", "CHROMGRNA" in the last 8760 hours.  Assessment and Plan: 76 y.o. male with ALS and aspiration PNA who is in need of G tube.   VSS CBC with mild leukocytosis, on Zosyn due to aspiration PNA - confirmed with Dr. Grace Isaac, OK to proceed.  No INR since 2021, will update  On prophylactic Lovenox 40 mg qd, 2200 hrs today does MAR held  No known drug allergy  Pt on Zosyn q8h   Risks and benefits image guided gastrostomy tube placement was discussed with the patient including, but not limited to the need for a barium enema during the procedure, bleeding, infection, peritonitis and/or damage to adjacent structures.  All of the patient's questions were answered, patient is agreeable to proceed.  Consent signed and in chart.  The procedure is tentatively scheduled for tomorrow pending IR schedule.   PLAN - patient currently NPO except ice chips, continue to keep pt NPO - if pt to be started on TF today, hold TF at MN (currently does not have NGT.) - INR in AM - No AC/AP till G tube  placement is done     Thank you for this interesting consult.  I greatly enjoyed meeting [id number] and look forward to participating in their care.  A copy of this report was sent to the requesting provider on this date.  Electronically Signed: Willette Brace, PA-C 04/09/2022, 4:05 PM   I spent a total of 40 Minutes    in face to face in clinical consultation, greater than 50% of which was counseling/coordinating care for G tube placement.   This chart was dictated using voice recognition software.  Despite best efforts to proofread,  errors can occur which can change the documentation meaning.

## 2022-04-09 NOTE — Progress Notes (Signed)
CPT on hold due to pt not available.

## 2022-04-09 NOTE — Progress Notes (Addendum)
PROGRESS NOTE  [place]  FKC:[id number] DOB: [birth year] DOA: 04/07/2022 PCP: Biagio Borg, MD   Brief Narrative: Thomas Raymond is a [place] speaking 76 y.o. male with medical history significant of hypertension, ALS with wheelchair-bound /bedbound status presented from home with complaints of cough which started about a week ago .On presentation, he was hemodynamically stable but was hypoxic with saturation of 86% on room air and was placed on 2 L of nasal cannula.  Lab work showed potassium of 2.2.  Chest imagings  showed right-sided pneumonia with opacification of the bronchus, right lower lobe, right middle lobe collapse.  Admitted for the management of aspiration pneumonia.  Underwent bronchoscopy with finding of thick purulent secretions noted on the right and obstructing right lower lobe,no endobronchial lesions seen.  Speech therapy recommending n.p.o. and recommended PEG due to chronic severe aspiration.  Palliative care also consulted  Assessment & Plan:  Principal Problem:   Aspiration into lower respiratory tract Active Problems:   ALS   Essential hypertension   Hypokalemia   Acute hypoxemic respiratory failure (HCC)   CAP (community acquired pneumonia)   Acute hypoxic respiratory failure: Secondary to aspiration pneumonia.  Present with cough, weakness, shortness of breath.  Not on oxygen at home.  Hypoxia on arrival.currently on 4 L .we are trying to wean the oxygen as tolerated   Right-sided pneumonia/aspiration pneumonia: Most likely this is aspiration pneumonia.  CT imaging as above showing collapse of right lower lobe and middle lobe Currently on zosyn.Follow-up blood cultures.  Improving  leukocytosis.  Lactate level normal.   Status post bronchoscopy.Thick purulent secretions noted on the right and obstructing right lower lobe . No endobronchial lesions seen.  Secretions were lavaged and suctioned out.  Specimen obtained for BAL . Culture growing gram positive cocci..   Continue Zosyn for now.  Severe dysphagia: Speech therapy recommending n.p.o. Underwent MBS.  Dysphagia secondary to ALS.  High risk for decompensation if continues to eat by mouth.  Patient and family agreeable for PEG, IR consulted  Severe hypokalemia: Being supplemented and monitored.  Magnesium also supplemented  H/O  of hypertension: Takes metoprolol, amlodipine, hydrochlorothiazide .  Currently normotensive.  These medications are on hold   Hyperlipidemia: Takes Crestor   History of ALS: Bedbound / wheelchair-bound mostly.  PT /oT consulted.  Lives with family.  Palliative care also consulted for goals of care.  Remains full code          DVT prophylaxis:enoxaparin (LOVENOX) injection 40 mg Start: 04/07/22 2200     Code Status: Full Code  Family Communication: Wife at bedside.Called daughter on phone,call not received  Patient status:Inpatient  Patient is from :Home  Anticipated discharge VC:[facility] vs snf  Estimated DC date:not sure.  Needs to finalize the plan for nutrition on discharge, plan for [facility]   Consultants: PCCM  Procedures:bronchocopy  Antimicrobials:  Anti-infectives (From admission, onward)    Start     Dose/Rate Route Frequency Ordered Stop   04/07/22 1600  vancomycin (VANCOREADY) IVPB 1250 mg/250 mL  Status:  Discontinued        1,250 mg 166.7 mL/hr over 90 Minutes Intravenous Every 24 hours 04/07/22 1551 04/08/22 0814   04/07/22 1600  piperacillin-tazobactam (ZOSYN) IVPB 3.375 g        3.375 g 12.5 mL/hr over 240 Minutes Intravenous Every 8 hours 04/07/22 1551     04/07/22 1500  cefTRIAXone (ROCEPHIN) 1 g in sodium chloride 0.9 % 100 mL IVPB  Status:  Discontinued  1 g 200 mL/hr over 30 Minutes Intravenous  Once 04/07/22 1451 04/07/22 1550   04/07/22 1500  azithromycin (ZITHROMAX) 500 mg in sodium chloride 0.9 % 250 mL IVPB  Status:  Discontinued        500 mg 250 mL/hr over 60 Minutes Intravenous  Once 04/07/22 1451 04/07/22 1550    04/07/22 1500  metroNIDAZOLE (FLAGYL) IVPB 500 mg  Status:  Discontinued        500 mg 100 mL/hr over 60 Minutes Intravenous  Once 04/07/22 1451 04/07/22 1550       Subjective:  Patient seen and examined at bedside today.  Hemodynamically stable.  On 4 L of oxygen.  Not in respiratory distress.  Looks very deconditioned, weak, lying in bed.  Wife at bedside.    Objective: Vitals:   04/08/22 0457 04/08/22 0700 04/08/22 0850 04/08/22 1336  BP:  115/64 126/64 136/81  Pulse:  69 76 87  Resp:  17 20 (!) 24  Temp: 98.6 F (37 C)  97.7 F (36.5 C) (!) 97.5 F (36.4 C)  TempSrc: Oral  Oral Oral  SpO2:  96% 95% 96%    Intake/Output Summary (Last 24 hours) at 04/08/2022 1403 Last data filed at 04/08/2022 1207 Gross per 24 hour  Intake 1762.02 ml  Output --  Net 1762.02 ml   There were no vitals filed for this visit.  Examination:  General exam: Very deconditioned, weak, chronically ill looking, lying in bed, not in distress HEENT: PERRL Respiratory system: Diminished air sound on the right side Cardiovascular system: S1 & S2 heard, RRR.  Gastrointestinal system: Abdomen is nondistended, soft and nontender. Central nervous system: Alert and oriented, generalized weakness Extremities: No edema, no clubbing ,no cyanosis Skin: No rashes, no ulcers,no icterus     Data Reviewed: I have personally reviewed following labs and imaging studies  CBC: Recent Labs  Lab 04/07/22 1039 04/08/22 0500  WBC 13.0* 11.1*  HGB 15.2 12.0*  HCT 46.2 37.6*  MCV 90.1 91.7  PLT 321 962   Basic Metabolic Panel: Recent Labs  Lab 04/07/22 1039 04/07/22 1334 04/08/22 0500  NA 137  --  138  K 2.2*  --  2.9*  CL 84*  --  93*  CO2 37*  --  30  GLUCOSE 105*  --  83  BUN 14  --  13  CREATININE 0.58*  --  0.56*  CALCIUM 9.0  --  8.2*  MG  --  2.2  --   PHOS  --  3.3  --      Recent Results (from the past 240 hour(s))  Resp panel by RT-PCR (RSV, Flu A&B, Covid) Anterior Nasal Swab      Status: Abnormal   Collection Time: 04/07/22  1:18 PM   Specimen: Anterior Nasal Swab  Result Value Ref Range Status   SARS Coronavirus 2 by RT PCR NEGATIVE NEGATIVE Final    Comment: (NOTE) SARS-CoV-2 target nucleic acids are NOT DETECTED.  The SARS-CoV-2 RNA is generally detectable in upper respiratory specimens during the acute phase of infection. The lowest concentration of SARS-CoV-2 viral copies this assay can detect is 138 copies/mL. A negative result does not preclude SARS-Cov-2 infection and should not be used as the sole basis for treatment or other patient management decisions. A negative result may occur with  improper specimen collection/handling, submission of specimen other than nasopharyngeal swab, presence of viral mutation(s) within the areas targeted by this assay, and inadequate number of viral copies(<138 copies/mL). A  negative result must be combined with clinical observations, patient history, and epidemiological information. The expected result is Negative.  Fact Sheet for Patients:  EntrepreneurPulse.com.au  Fact Sheet for Healthcare Providers:  IncredibleEmployment.be  This test is no t yet approved or cleared by the [place] FDA and  has been authorized for detection and/or diagnosis of SARS-CoV-2 by FDA under an Emergency Use Authorization (EUA). This EUA will remain  in effect (meaning this test can be used) for the duration of the COVID-19 declaration under Section 564(b)(1) of the Act, 21 U.S.C.section 360bbb-3(b)(1), unless the authorization is terminated  or revoked sooner.       Influenza A by PCR NEGATIVE NEGATIVE Final   Influenza B by PCR NEGATIVE NEGATIVE Final    Comment: (NOTE) The Xpert Xpress SARS-CoV-2/FLU/RSV plus assay is intended as an aid in the diagnosis of influenza from Nasopharyngeal swab specimens and should not be used as a sole basis for treatment. Nasal washings and aspirates are  unacceptable for Xpert Xpress SARS-CoV-2/FLU/RSV testing.  Fact Sheet for Patients: EntrepreneurPulse.com.au  Fact Sheet for Healthcare Providers: IncredibleEmployment.be  This test is not yet approved or cleared by the [place] FDA and has been authorized for detection and/or diagnosis of SARS-CoV-2 by FDA under an Emergency Use Authorization (EUA). This EUA will remain in effect (meaning this test can be used) for the duration of the COVID-19 declaration under Section 564(b)(1) of the Act, 21 U.S.C. section 360bbb-3(b)(1), unless the authorization is terminated or revoked.     Resp Syncytial Virus by PCR POSITIVE (A) NEGATIVE Final    Comment: (NOTE) Fact Sheet for Patients: EntrepreneurPulse.com.au  Fact Sheet for Healthcare Providers: IncredibleEmployment.be  This test is not yet approved or cleared by the [place] FDA and has been authorized for detection and/or diagnosis of SARS-CoV-2 by FDA under an Emergency Use Authorization (EUA). This EUA will remain in effect (meaning this test can be used) for the duration of the COVID-19 declaration under Section 564(b)(1) of the Act, 21 U.S.C. section 360bbb-3(b)(1), unless the authorization is terminated or revoked.  Performed at [facility], [place] [address], [place], [place] [address]   Culture, blood (routine x 2)     Status: None (Preliminary result)   Collection Time: 04/07/22  1:30 PM   Specimen: BLOOD  Result Value Ref Range Status   Specimen Description   Final    BLOOD LEFT ANTECUBITAL Performed at [facility] [address] [place], [place] [address]    Special Requests   Final    BOTTLES DRAWN AEROBIC AND ANAEROBIC Blood Culture adequate volume Performed at [facility] [address] [place], [place] [address]    Culture   Final    NO GROWTH < 24 HOURS Performed at  [facility] [address] [place], [place] [address]    Report Status PENDING  Incomplete  Culture, blood (routine x 2)     Status: None (Preliminary result)   Collection Time: 04/07/22  1:35 PM   Specimen: BLOOD  Result Value Ref Range Status   Specimen Description   Final    BLOOD RIGHT ANTECUBITAL Performed at [facility] [address] [place], [place] [address]    Special Requests   Final    BOTTLES DRAWN AEROBIC AND ANAEROBIC Blood Culture adequate volume Performed at [facility] [address], [place], [place] [address]    Culture   Final    NO GROWTH <  24 HOURS Performed at [facility], [place] [address] [place], [place] [address]    Report Status PENDING  Incomplete  MRSA Next Gen by PCR, Nasal     Status: None   Collection Time: 04/07/22  7:34 PM   Specimen: Nasal Mucosa; Nasal Swab  Result Value Ref Range Status   MRSA by PCR Next Gen NOT DETECTED NOT DETECTED Final    Comment: (NOTE) The GeneXpert MRSA Assay (FDA approved for NASAL specimens only), is one component of a comprehensive MRSA colonization surveillance program. It is not intended to diagnose MRSA infection nor to guide or monitor treatment for MRSA infections. Test performance is not FDA approved in patients less than 39 years old. Performed at [facility], [place] [address], [place], [place] [address]      Radiology Studies: Portable chest 1 View  Result Date: 04/08/2022 CLINICAL DATA:  [id number] with shortness of breath. EXAM: PORTABLE CHEST 1 VIEW COMPARISON:  PA Lat and chest CT both 04/07/2022 FINDINGS: 7:03 a.m. There is dense consolidation right middle and lower lobes and volume loss. Overall aeration is unchanged. Remaining lungs are clear. There is mild cardiomegaly. No findings of CHF. Aortic atherosclerosis with stable mediastinum. Multiple surgical clips right neck base. Osteopenia and chronic healed fracture proximal  right humerus. IMPRESSION: Persistent dense consolidation and volume loss right middle and lower lobes. No change from yesterday's studies. Electronically Signed   By: Telford Nab M.D.   On: 04/08/2022 07:22   CT CHEST ABDOMEN PELVIS W CONTRAST  Result Date: 04/07/2022 CLINICAL DATA:  Cough.  Sepsis. EXAM: CT CHEST, ABDOMEN, AND PELVIS WITH CONTRAST TECHNIQUE: Multidetector CT imaging of the chest, abdomen and pelvis was performed following the standard protocol during bolus administration of intravenous contrast. RADIATION DOSE REDUCTION: This exam was performed according to the departmental dose-optimization program which includes automated exposure control, adjustment of the mA and/or kV according to patient size and/or use of iterative reconstruction technique. CONTRAST:  130m OMNIPAQUE IOHEXOL 300 MG/ML  SOLN COMPARISON:  CT the abdomen and pelvis November 12, 2019. Chest x-ray April 07, 2022. Chest x-ray October 19, 2015. FINDINGS: CT CHEST FINDINGS Cardiovascular: Heart size is normal. Calcified atherosclerotic changes are identified in the nonaneurysmal aorta. No dissection. Central pulmonary arteries are normal in caliber. No central pulmonary artery filling defects. Mediastinum/Nodes: The thyroid and esophagus are normal. No pleural or pericardial effusions. Few partially calcified lymph nodes are identified in the mediastinum consistent with previous granulomatous disease. No suspicious adenopathy is identified. The chest wall is normal. Lungs/Pleura: The trachea and mainstem bronchi are are well aerated. There is opacification of the bronchus intermedius and right lower lobe bronchus. No pneumothorax. Emphysematous changes identified in the lungs, particularly the upper lobes. A nodule in the medial right apex on series 7, image 26 is unchanged since June 2017, of no significance. Scattered opacities are identified throughout the right upper lobe, primarily ground glass and nodular in nature. The  right middle lobe is largely collapsed with air bronchograms. There is complete collapse of the right lower lobe. No mass identified. Mild atelectasis in the left base. No suspicious nodules, masses, or infiltrates identified in the left lung. Musculoskeletal: No chest wall mass or suspicious bone lesions identified. CT ABDOMEN PELVIS FINDINGS Hepatobiliary: The patient is status post cholecystectomy. No liver masses identified. The portal vein is patent. Pancreas: Unremarkable. No pancreatic ductal dilatation or surrounding inflammatory changes. Spleen: Normal in size without focal abnormality. Adrenals/Urinary Tract: Adrenal glands are unremarkable.  Kidneys are normal, without renal calculi, focal lesion, or hydronephrosis. Bladder is unremarkable. Stomach/Bowel: Stomach is within normal limits. Appendix appears normal. No evidence of bowel wall thickening, distention, or inflammatory changes. Vascular/Lymphatic: Calcified atherosclerotic changes are identified in the nonaneurysmal aorta, iliac vessels, and femoral vessels. No adenopathy. Reproductive: Prostate is unremarkable. Other: No abdominal wall hernia or abnormality. No abdominopelvic ascites. Musculoskeletal: No acute or significant osseous findings. IMPRESSION: 1. There is opacification of the bronchus intermedius and right lower lobe bronchus with complete collapse of the right lower lobe. The right middle lobe is largely collapsed with air bronchograms. The constellation of findings could be due to mucous plugging or aspiration. Recommend short-term follow-up imaging imaging to ensure resolution. 2. Scattered opacities in the right upper lobe are likely infection/pneumonia. Recommend attention on follow-up. 3. Emphysematous changes in the lungs. 4. Calcified atherosclerotic changes in the nonaneurysmal aorta, iliac vessels, and femoral vessels. 5. No other acute abnormalities. 6. Aortic atherosclerosis. Aortic Atherosclerosis (ICD10-I70.0) and  Emphysema (ICD10-J43.9). Electronically Signed   By: Dorise Bullion III M.D.   On: 04/07/2022 14:24   DG Chest 2 View  Result Date: 04/07/2022 CLINICAL DATA:  Hypoxia.  Increased weakness. EXAM: CHEST - 2 VIEW COMPARISON:  One-view chest x-ray 11/11/2019 FINDINGS: Heart is mildly enlarged. Chronic elevation the right hemidiaphragm noted. Mild pulmonary vascular congestion is present. A right pleural effusion is present. Asymmetric right basilar airspace disease is present. No significant edema is present. IMPRESSION: 1. Cardiomegaly and mild pulmonary vascular congestion without frank edema. 2. Right pleural effusion and asymmetric right basilar airspace disease. While this may represent atelectasis, there is concern for infection. Electronically Signed   By: San Morelle M.D.   On: 04/07/2022 11:25    Scheduled Meds:  enoxaparin (LOVENOX) injection  40 mg Subcutaneous Q24H   gabapentin  300 mg Oral TID   pantoprazole  40 mg Oral Daily   rosuvastatin  20 mg Oral Daily   sodium chloride HYPERTONIC  4 mL Nebulization BID   Continuous Infusions:  lactated ringers Stopped (04/08/22 0739)   piperacillin-tazobactam (ZOSYN)  IV Stopped (04/08/22 1148)   potassium chloride 10 mEq (04/08/22 1328)     LOS: 1 day   Shelly Coss, MD [facility] P12/18/2023, 2:03 PM

## 2022-04-10 ENCOUNTER — Inpatient Hospital Stay (HOSPITAL_COMMUNITY): Payer: Medicare HMO

## 2022-04-10 DIAGNOSIS — T17800D Unspecified foreign body in other parts of respiratory tract causing asphyxiation, subsequent encounter: Secondary | ICD-10-CM | POA: Diagnosis not present

## 2022-04-10 DIAGNOSIS — I10 Essential (primary) hypertension: Secondary | ICD-10-CM | POA: Diagnosis not present

## 2022-04-10 DIAGNOSIS — G1221 Amyotrophic lateral sclerosis: Secondary | ICD-10-CM

## 2022-04-10 DIAGNOSIS — J9601 Acute respiratory failure with hypoxia: Secondary | ICD-10-CM | POA: Diagnosis not present

## 2022-04-10 HISTORY — PX: IR GASTROSTOMY TUBE MOD SED: IMG625

## 2022-04-10 LAB — CBC
HCT: 35.8 % — ABNORMAL LOW (ref 39.0–52.0)
Hemoglobin: 11 g/dL — ABNORMAL LOW (ref 13.0–17.0)
MCH: 28.8 pg (ref 26.0–34.0)
MCHC: 30.7 g/dL (ref 30.0–36.0)
MCV: 93.7 fL (ref 80.0–100.0)
Platelets: 292 10*3/uL (ref 150–400)
RBC: 3.82 MIL/uL — ABNORMAL LOW (ref 4.22–5.81)
RDW: 13.8 % (ref 11.5–15.5)
WBC: 8.8 10*3/uL (ref 4.0–10.5)
nRBC: 0 % (ref 0.0–0.2)

## 2022-04-10 LAB — BASIC METABOLIC PANEL
Anion gap: 9 (ref 5–15)
BUN: 8 mg/dL (ref 8–23)
CO2: 26 mmol/L (ref 22–32)
Calcium: 7.6 mg/dL — ABNORMAL LOW (ref 8.9–10.3)
Chloride: 106 mmol/L (ref 98–111)
Creatinine, Ser: 0.36 mg/dL — ABNORMAL LOW (ref 0.61–1.24)
GFR, Estimated: >60 mL/min (ref >60)
Glucose, Bld: 94 mg/dL (ref 70–99)
Potassium: 4 mmol/L (ref 3.5–5.1)
Sodium: 141 mmol/L (ref 135–145)

## 2022-04-10 LAB — MAGNESIUM: Magnesium: 2.1 mg/dL (ref 1.7–2.4)

## 2022-04-10 LAB — ACID FAST SMEAR (AFB, MYCOBACTERIA): Acid Fast Smear: NEGATIVE

## 2022-04-10 LAB — PROTIME-INR
INR: 1 (ref 0.8–1.2)
Prothrombin Time: 13.4 seconds (ref 11.4–15.2)

## 2022-04-10 LAB — CYTOLOGY - NON PAP

## 2022-04-10 MED ORDER — ORAL CARE MOUTH RINSE
15.0000 mL | OROMUCOSAL | Status: DC
  Administered 2022-04-10 – 2022-04-12 (×10): 15 mL via OROMUCOSAL

## 2022-04-10 MED ORDER — FENTANYL CITRATE (PF) 100 MCG/2ML IJ SOLN
INTRAMUSCULAR | Status: AC | PRN
  Administered 2022-04-10: 50 ug via INTRAVENOUS
  Administered 2022-04-10 (×2): 25 ug via INTRAVENOUS

## 2022-04-10 MED ORDER — METOPROLOL SUCCINATE ER 50 MG PO TB24: 50.0000 mg | ORAL_TABLET | Freq: Every day | ORAL | Status: DC

## 2022-04-10 MED ORDER — FENTANYL CITRATE (PF) 100 MCG/2ML IJ SOLN
INTRAMUSCULAR | Status: AC
  Filled 2022-04-10: qty 2

## 2022-04-10 MED ORDER — LIDOCAINE HCL 1 % IJ SOLN
INTRAMUSCULAR | Status: AC
  Filled 2022-04-10: qty 20

## 2022-04-10 MED ORDER — GLUCAGON HCL RDNA (DIAGNOSTIC) 1 MG IJ SOLR
INTRAMUSCULAR | Status: AC
  Filled 2022-04-10: qty 1

## 2022-04-10 MED ORDER — GABAPENTIN 250 MG/5ML PO SOLN
300.0000 mg | Freq: Three times a day (TID) | ORAL | Status: DC
  Administered 2022-04-11 – 2022-04-12 (×6): 300 mg
  Filled 2022-04-10 (×8): qty 6

## 2022-04-10 MED ORDER — METOPROLOL TARTRATE 25 MG PO TABS: 25.0000 mg | ORAL_TABLET | Freq: Two times a day (BID) | ORAL | Status: DC

## 2022-04-10 MED ORDER — PANTOPRAZOLE SODIUM 40 MG IV SOLR
40.0000 mg | Freq: Every day | INTRAVENOUS | Status: DC
  Administered 2022-04-10 – 2022-04-11 (×2): 40 mg via INTRAVENOUS
  Filled 2022-04-10 (×2): qty 10

## 2022-04-10 MED ORDER — MORPHINE SULFATE (PF) 2 MG/ML IV SOLN
1.0000 mg | INTRAVENOUS | Status: DC | PRN
  Administered 2022-04-10 – 2022-04-12 (×6): 1 mg via INTRAVENOUS
  Filled 2022-04-10 (×6): qty 1

## 2022-04-10 MED ORDER — MIDAZOLAM HCL 2 MG/2ML IJ SOLN
INTRAMUSCULAR | Status: AC | PRN
  Administered 2022-04-10: .5 mg via INTRAVENOUS
  Administered 2022-04-10: 1 mg via INTRAVENOUS
  Administered 2022-04-10: .5 mg via INTRAVENOUS

## 2022-04-10 MED ORDER — METOPROLOL TARTRATE 25 MG/10 ML ORAL SUSPENSION
25.0000 mg | Freq: Two times a day (BID) | ORAL | Status: DC
  Administered 2022-04-10 – 2022-04-12 (×3): 25 mg
  Filled 2022-04-10 (×5): qty 10

## 2022-04-10 MED ORDER — ROSUVASTATIN CALCIUM 20 MG PO TABS
20.0000 mg | ORAL_TABLET | Freq: Every day | ORAL | Status: DC
  Administered 2022-04-11 – 2022-04-12 (×2): 20 mg
  Filled 2022-04-10 (×2): qty 1

## 2022-04-10 MED ORDER — MIDAZOLAM HCL 2 MG/2ML IJ SOLN
INTRAMUSCULAR | Status: AC
  Filled 2022-04-10: qty 2

## 2022-04-10 MED ORDER — POLYETHYLENE GLYCOL 3350 17 G PO PACK: 17.0000 g | PACK | Freq: Every day | ORAL | Status: DC | PRN

## 2022-04-10 MED ORDER — ORAL CARE MOUTH RINSE: 15.0000 mL | OROMUCOSAL | Status: DC | PRN

## 2022-04-10 NOTE — Evaluation (Signed)
Physical Therapy Evaluation Patient Details Name: Thomas Raymond MRN: [id number] DOB: [birth year] Today's Date: 04/10/2022  History of Present Illness  76 year old [place] man with ALS, wheelchair-bound presented with shortness of breathe. CT chest/abdomen/pelvis showed right middle lobe consolidation and collapse of right lower lobe with consolidation, some groundglass in the right upper lobe. Patietn admitted Acute hypoxic respiratory failure  Right middle and lower lobe atelectasis/consolidation likely related to aspiration pneumonia  Clinical Impression  Pt admitted with above diagnosis.  Pt currently with functional limitations due to the deficits listed below (see PT Problem List). Pt will benefit from skilled PT to increase their independence and safety with mobility to allow discharge to the venue listed below.    The patient reporting pain in  the legs and back, stating he has not eaten for 5 days. Noted patient to get a  PEG. Patient requires total assistance for transfers to a power chair, indicates uses a transfer board. No family to confirm. Patient's family provides care. Acute PT intervention most likely limited due to quadriparesis  noted today.  Will  continue PT to assess for any further needs at Dc.   Recommendations for follow up therapy are one component of a multi-disciplinary discharge planning process, led by the attending physician.  Recommendations may be updated based on patient status, additional functional criteria and insurance authorization.  Follow Up Recommendations No PT follow up      Assistance Recommended at Discharge Frequent or constant Supervision/Assistance  Patient can return home with the following  Two people to help with walking and/or transfers;A lot of help with bathing/dressing/bathroom;Assist for transportation    Equipment Recommendations None recommended by PT  Recommendations for Other Services       Functional Status Assessment Patient has  not had a recent decline in their functional status     Precautions / Restrictions Precautions Precaution Comments: aspiration precautions - needs yonker Restrictions Weight Bearing Restrictions: No      Mobility  Bed Mobility   Bed Mobility: Rolling Rolling: Total assist         General bed mobility comments: limited assessment, patient reporting pain and he is weak from no food.    Transfers                   General transfer comment: NT    Ambulation/Gait                  Stairs            Wheelchair Mobility    Modified Rankin (Stroke Patients Only)       Balance                                             Pertinent Vitals/Pain Pain Assessment Faces Pain Scale: Hurts even more Pain Location: legs and back Pain Descriptors / Indicators: Grimacing Pain Intervention(s): Patient requesting pain meds-RN notified, Limited activity within patient's tolerance    Home Living Family/patient expects to be discharged to:: Private residence Living Arrangements: Spouse/significant other Available Help at Discharge: Family;Available 24 hours/day Type of Home: House Home Access: Ramped entrance       Home Layout: One level Home Equipment: Hospital bed;Wheelchair - power Additional Comments: Patient transfers with family - slides to wheelchair. Does not use shower, sponge bathes. Wife assists with most ADLs. Patient able to feed self  Prior Function Prior Level of Function : Needs assist             Mobility Comments: slide transfers, power chair. Reports he gets to wc at 6 am and doesnt get back to bed till 6 pm ADLs Comments: needs assistance with all ADLs, can typically feed himself, reports he transfers to [facility] - over the toilet I think     Hand Dominance   Dominant Hand: Right    Extremity/Trunk Assessment   Upper Extremity Assessment Upper Extremity Assessment: Defer to OT evaluation RUE Deficits /  Details: 2-/5 shouler ROM, grossly 3/5 for elbow,wrist and fingers. Hand swollend. Able to grossly open and close fingers. 2nd digit lagging but can be passively opened RUE Coordination: decreased fine motor;decreased gross motor LUE Deficits / Details: 2+/5 shoulder ROM, elbow, wrist and fingers frossly 3/5 LUE Coordination: decreased fine motor;decreased gross motor    Lower Extremity Assessment Lower Extremity Assessment: RLE deficits/detail;LLE deficits/detail RLE Deficits / Details: 1/5 ankle dorsiflex, 1+ hip flex, unable to fully test due to patient reporting  pain and not willing,unable to assess sensation LLE Deficits / Details: similar to right, unable to fully assess sensation    Cervical / Trunk Assessment Cervical / Trunk Assessment: Normal  Communication   Communication: Interpreter utilized  Cognition Arousal/Alertness: Awake/alert Behavior During Therapy: Flat affect Overall Cognitive Status: Difficult to assess                                 General Comments: answers all questiona via interpreter        General Comments General comments (skin integrity, edema, etc.): NT    Exercises     Assessment/Plan    PT Assessment Patient needs continued PT services  PT Problem List Impaired tone;Decreased mobility;Impaired sensation;Pain       PT Treatment Interventions Functional mobility training;Therapeutic activities;Patient/family education    PT Goals (Current goals can be found in the Care Plan section)  Acute Rehab PT Goals Patient Stated Goal: to eat PT Goal Formulation: With patient Time For Goal Achievement: 04/23/22 Potential to Achieve Goals: Fair    Frequency Min 2X/week     Co-evaluation PT/OT/SLP Co-Evaluation/Treatment: Yes Reason for Co-Treatment: Complexity of the patient's impairments (multi-system involvement);To address functional/ADL transfers PT goals addressed during session: Mobility/safety with mobility OT goals  addressed during session: ADL's and self-care       AM-PAC PT "6 Clicks" Mobility  Outcome Measure Help needed turning from your back to your side while in a flat bed without using bedrails?: Total Help needed moving from lying on your back to sitting on the side of a flat bed without using bedrails?: Total Help needed moving to and from a bed to a chair (including a wheelchair)?: Total Help needed standing up from a chair using your arms (e.g., wheelchair or bedside chair)?: Total Help needed to walk in hospital room?: Total Help needed climbing 3-5 steps with a railing? : Total 6 Click Score: 6    End of Session   Activity Tolerance: Patient limited by fatigue Patient left: in bed;with call bell/phone within reach Nurse Communication: Mobility status;Need for lift equipment PT Visit Diagnosis: Other symptoms and signs involving the nervous system (J24.268)    Time: 3419-6222 PT Time Calculation (min) (ACUTE ONLY): 21 min   Charges:   PT Evaluation $PT Eval Low Complexity: 1 Low          Blanchard Kelch  PT Acute Rehabilitation Services Office [phone] Weekend [phone]   Rada Hay 04/10/2022, 12:58 PM

## 2022-04-10 NOTE — Progress Notes (Signed)
[facility]  PROGRESS NOTE  [place] MVE:[id number] DOB: [birth year] DOA: 04/07/2022 PCP: Biagio Borg, MD   Brief HPI:    76 y.o. male with medical history significant of hypertension, ALS with wheelchair-bound /bedbound status presented from home with complaints of cough which started about a week ago .On presentation, he was hemodynamically stable but was hypoxic with saturation of 86% on room air and was placed on 2 L of nasal cannula.   Chest imagings  showed right-sided pneumonia with opacification of the bronchus, right lower lobe, right middle lobe collapse.  Admitted for the management of aspiration pneumonia.  Underwent bronchoscopy with finding of thick purulent secretions noted on the right and obstructing right lower lobe,no endobronchial lesions seen.  Speech therapy recommending n.p.o. and recommended PEG due to chronic severe aspiration.   Subjective   Patient seen, currently n.p.o., awaiting to get PEG tube placement per IR.   Assessment/Plan:    Acute hypoxemic respiratory failure -Secondary to aspiration pneumonia -Presented with cough, weakness and shortness of breath -Not on oxygen at home -Currently requiring 3 L/min of oxygen -Wean off oxygen as tolerated  Right-sided pneumonia/aspiration pneumonia -CT imaging showed collapse of right lower lobe and middle lobe -Currently on IV Zosyn -Follow-up blood culture results -WBC is down to 8.8 Status post bronchoscopy.Thick purulent secretions noted on the right and obstructing right lower lobe . No endobronchial lesions seen.  Secretions were lavaged and suctioned out.  Specimen obtained for BAL . Culture growing gram positive cocci. -Continue IV Zosyn  Severe dysphagia -Swallow evaluation obtained, speech therapy recommended n.p.o. -Underwent MBS; has dysphagia due to ALS -Patient and family agreeable for PEG tube -IR consulted for PEG tube placement  Hypokalemia -replete  History of  hypertension -Blood pressure is mildly elevated -Restart metoprolol which patient was taking at home -Continue to hold HCTZ amlodipine  Hyperlipidemia -Continue Crestor  History of ALS -Bedbound/wheelchair-bound mostly -PT/OT consulted -Lives with family    Medications     enoxaparin (LOVENOX) injection  40 mg Subcutaneous Q24H   fentaNYL       gabapentin  300 mg Oral TID   glucagon (human recombinant)       lidocaine       midazolam       mouth rinse  15 mL Mouth Rinse 4 times per day   pantoprazole  40 mg Oral Daily   rosuvastatin  20 mg Oral Daily     Data Reviewed:   CBG:  No results for input(s): "GLUCAP" in the last 168 hours.  SpO2: 95 % O2 Flow Rate (L/min): 3 L/min FiO2 (%): 36 %    Vitals:   04/10/22 1600 04/10/22 1605 04/10/22 1610 04/10/22 1615  BP: (!) 174/78 (!) 143/85 (!) 135/96 (!) 152/78  Pulse: 78 78 78 89  Resp: _0 (!) 21  Temp:      TempSrc:      SpO2: 96% 98% 94% 95%  Weight:          Data Reviewed:  Basic Metabolic Panel: Recent Labs  Lab 04/07/22 1039 04/07/22 1334 04/08/22 0500 04/09/22 0632 04/10/22 0523  NA 137  --  138 140 141  K 2.2*  --  2.9* 2.8* 4.0  CL 84*  --  93* 102 106  CO2 37*  --  _1 GLUCOSE 105*  --  83 117* 94  BUN 14  --  13 7* 8  CREATININE 0.58*  --  0.56* 0.41* 0.36*  CALCIUM 9.0  --  8.2* 7.9* 7.6*  MG  --  2.2  --  1.7 2.1  PHOS  --  3.3  --   --   --     CBC: Recent Labs  Lab 04/07/22 1039 04/08/22 0500 04/09/22 0632 04/10/22 0523  WBC 13.0* 11.1* 11.3* 8.8  HGB 15.2 12.0* 12.6* 11.0*  HCT 46.2 37.6* 40.4 35.8*  MCV 90.1 91.7 92.9 93.7  PLT 321 295 308 292    LFT Recent Labs  Lab 04/07/22 1318  AST 28  ALT 17  ALKPHOS 84  BILITOT 1.4*  PROT 8.0  ALBUMIN 2.9*     Antibiotics: Anti-infectives (From admission, onward)    Start     Dose/Rate Route Frequency Ordered Stop   04/07/22 1600  vancomycin (VANCOREADY) IVPB 1250 mg/250 mL  Status:  Discontinued         1,250 mg 166.7 mL/hr over 90 Minutes Intravenous Every 24 hours 04/07/22 1551 04/08/22 0814   04/07/22 1600  piperacillin-tazobactam (ZOSYN) IVPB 3.375 g        3.375 g 12.5 mL/hr over 240 Minutes Intravenous Every 8 hours 04/07/22 1551     04/07/22 1500  cefTRIAXone (ROCEPHIN) 1 g in sodium chloride 0.9 % 100 mL IVPB  Status:  Discontinued        1 g 200 mL/hr over 30 Minutes Intravenous  Once 04/07/22 1451 04/07/22 1550   04/07/22 1500  azithromycin (ZITHROMAX) 500 mg in sodium chloride 0.9 % 250 mL IVPB  Status:  Discontinued        500 mg 250 mL/hr over 60 Minutes Intravenous  Once 04/07/22 1451 04/07/22 1550   04/07/22 1500  metroNIDAZOLE (FLAGYL) IVPB 500 mg  Status:  Discontinued        500 mg 100 mL/hr over 60 Minutes Intravenous  Once 04/07/22 1451 04/07/22 1550        DVT prophylaxis: Lovenox  Code Status: Full code  Family Communication:    CONSULTS    Objective    Physical Examination:   General: Appears in no acute distress Cardiovascular: S1-S2, regular Respiratory: Clear to auscultation bilaterally Abdomen: Abdomen is soft, nontender, no organomegaly Extremities: No edema in the lower extremities    Status is: Inpatient:             Oswald Hillock   [facility] If 7PM-7AM, please contact night-coverage at [email or web address], Office  [phone]   04/10/2022, 5:12 PM  LOS: 3 days

## 2022-04-10 NOTE — Procedures (Signed)
Pre procedure Dx: Dysphagia d/t ALS Post Procedure Dx: Same  Successful fluoroscopic guided insertion of gastrostomy tube.   The gastrostomy tube may be used immediately for medications.   Tube feeds may be initiated in 24 hours as per the primary team.    EBL: Trace Complications: None immediate  Katherina Right, MD Pager #: [phone]

## 2022-04-10 NOTE — Evaluation (Signed)
Occupational Therapy Evaluation Patient Details Name: Thomas Raymond MRN: [id number] DOB: Aug 12, 1945 Today's Date: 04/10/2022   History of Present Illness 76 year old [place]ian man with ALS, wheelchair-bound presented with shortness of breathe. CT chest/abdomen/pelvis showed right middle lobe consolidation and collapse of right lower lobe with consolidation, some groundglass in the right upper lobe. Patietn admitted Acute hypoxic respiratory failure  Right middle and lower lobe atelectasis/consolidation likely related to aspiration pneumonia   Clinical Impression   [name] is a 76 year old man who presents with above medical history. At baseline - he has assistance for Adls, reports something to the effect of transferring to [facility] over toilet (impaired communication due to language), patient transferred to power chair daily. He is usually able to feed himself and use his arms more. Today evaluation is significantly limited with patient complaining of pain and weakness from lack of nutrition. He is due to get a PEG tube today. Today he can bring the yonker to his mouth with his left hand but otherwise presents with significantly weak upper extremities. He needs active assist to raise his arms. He refuses mobility. Overall he is near total assist at bed level at this time. Patient will benefit from skilled OT services while in hospital to improve deficits and learn compensatory strategies as needed in order to return to return home at discharge. Will need to make sure patient's family can still provide level of care needed for discharge home - if not, may need SNF.       Recommendations for follow up therapy are one component of a multi-disciplinary discharge planning process, led by the attending physician.  Recommendations may be updated based on patient status, additional functional criteria and insurance authorization.   Follow Up Recommendations  Home health OT     Assistance Recommended at  Discharge Frequent or constant Supervision/Assistance  Patient can return home with the following A lot of help with walking and/or transfers;A lot of help with bathing/dressing/bathroom;Assistance with cooking/housework;Assist for transportation;Help with stairs or ramp for entrance    Functional Status Assessment  Patient has had a recent decline in their functional status and/or demonstrates limited ability to make significant improvements in function in a reasonable and predictable amount of time  Equipment Recommendations  None recommended by OT    Recommendations for Other Services       Precautions / Restrictions Precautions Precaution Comments: aspiration precautions - needs yonker Restrictions Weight Bearing Restrictions: No      Mobility Bed Mobility Overal bed mobility: Needs Assistance Bed Mobility: Rolling Rolling: Total assist              Transfers                              ADL either performed or assessed with clinical judgement   ADL Overall ADL's : Needs assistance/impaired Eating/Feeding: NPO   Grooming: Maximal assistance;Bed level Grooming Details (indicate cue type and reason): able to grossly bring yonger to mouth but needs assistance for all other grooming tasks Upper Body Bathing: Bed level;Total assistance   Lower Body Bathing: Total assistance   Upper Body Dressing : Total assistance;Bed level   Lower Body Dressing: Total assistance;Bed level   Toilet Transfer: Total assistance   Toileting- Clothing Manipulation and Hygiene: Total assistance;Bed level       Functional mobility during ADLs: Total assistance;+2 for physical assistance       Vision Patient Visual Report: No  change from baseline       Perception     Praxis      Pertinent Vitals/Pain Pain Assessment Pain Assessment: Faces Faces Pain Scale: Hurts little more Pain Location: legs and back Pain Descriptors / Indicators: Grimacing Pain  Intervention(s): Patient requesting pain meds-RN notified     Hand Dominance Right   Extremity/Trunk Assessment Upper Extremity Assessment Upper Extremity Assessment: RUE deficits/detail;LUE deficits/detail RUE Deficits / Details: 2-/5 shouler ROM, grossly 3/5 for elbow,wrist and fingers. Hand swollend. Able to grossly open and close fingers. 2nd digit lagging but can be passively opened RUE Coordination: decreased fine motor;decreased gross motor LUE Deficits / Details: 2+/5 shoulder ROM, elbow, wrist and fingers frossly 3/5 LUE Coordination: decreased fine motor;decreased gross motor   Lower Extremity Assessment Lower Extremity Assessment: Defer to PT evaluation   Cervical / Trunk Assessment Cervical / Trunk Assessment: Normal   Communication Communication Communication: Prefers language other than English   Cognition Arousal/Alertness: Awake/alert Behavior During Therapy: WFL for tasks assessed/performed Overall Cognitive Status: Difficult to assess                                                  Home Living Family/patient expects to be discharged to:: Private residence Living Arrangements: Spouse/significant other Available Help at Discharge: Family;Available 24 hours/day Type of Home: House Home Access: Ramped entrance     Home Layout: One level         Firefighter: Standard     Home Equipment: Hospital bed;Wheelchair - power   Additional Comments: Patient transfers with family - slides to wheelchair. Does not use shower, sponge bathes. Wife assists with most ADLs. Patient able to feed self      Prior Functioning/Environment Prior Level of Function : Needs assist             Mobility Comments: slide transfers, power chair. Reports he gets to wc at 6 am and doesnt get back to bed till 6 pm ADLs Comments: needs assistance with all ADLs, can typically feed himself        OT Problem List: Decreased strength;Decreased activity  tolerance;Pain      OT Treatment/Interventions: Self-care/ADL training;Therapeutic exercise;DME and/or AE instruction;Patient/family education;Therapeutic activities    OT Goals(Current goals can be found in the care plan section) Acute Rehab OT Goals Patient Stated Goal: get nutrition OT Goal Formulation: With patient Time For Goal Achievement: 04/24/22 Potential to Achieve Goals: Fair  OT Frequency: Min 2X/week    Co-evaluation   Reason for Co-Treatment: Complexity of the patient's impairments (multi-system involvement);To address functional/ADL transfers PT goals addressed during session: Mobility/safety with mobility OT goals addressed during session: ADL's and self-care      AM-PAC OT "6 Clicks" Daily Activity     Outcome Measure Help from another person eating meals?: Total (NPO) Help from another person taking care of personal grooming?: A Lot Help from another person toileting, which includes using toliet, bedpan, or urinal?: Total Help from another person bathing (including washing, rinsing, drying)?: Total Help from another person to put on and taking off regular upper body clothing?: Total Help from another person to put on and taking off regular lower body clothing?: Total 6 Click Score: 7   End of Session Nurse Communication:  (okay to see)  Activity Tolerance: Patient limited by fatigue;Patient limited by pain Patient left: in bed;with  call bell/phone within reach;with bed alarm set  OT Visit Diagnosis: Muscle weakness (generalized) (M62.81)                Time: 5809-9833 OT Time Calculation (min): 18 min Charges:  OT General Charges $OT Visit: 1 Visit OT Evaluation $OT Eval Low Complexity: 1 Low  Donnella Sham, OTR/L Acute Care Rehab Services  Office [phone]   Kelli Churn 04/10/2022, 12:50 PM

## 2022-04-10 NOTE — Progress Notes (Signed)
  Daily Progress Note   Patient Name: Thomas Raymond       Date: 04/10/2022 DOB: Oct 20, 1945  Age: 76 y.o. MRN#: [id number] Attending Physician: Meredeth Ide, MD Primary Care Physician: Corwin Levins, MD Admit Date: 04/07/2022 Length of Stay: 3 days  Discussed care with primary hospitalist today. New consult placed for palliative medicine team on 04/09/22. As per EMR review and discussions, patient has already discussed medical decisions with care providers and plans to proceed with PEG tube placement in setting of ALS. Should there be a change in status, please reach out to palliative medicine team and we would be happy to be involved in the future. Thank you.    Alvester Morin, DO Palliative Care Provider PMT # [phone]

## 2022-04-10 NOTE — Progress Notes (Signed)
MEDICATION-RELATED CONSULT NOTE   IR Procedure Consult - Anticoagulant/Antiplatelet PTA/Inpatient Med List Review by Pharmacist    Procedure: G-tube insertion    Completed: 12/20 @ 16:23  Post-Procedural bleeding risk per IR MD assessment: STANDARD  Antithrombotic medications on inpatient profile prior to procedure:  Lovenox 40 mg SQ q2200 PTA antithrombotic medications:  none    Recommended restart time per IR Post-Procedure Guidelines:  POD1 AM   Other considerations:  pre-procedural LMWH order remains active; today's dose held for procedure; next dose due POD1 at 2200, which is within appropriate timeframe per guidelines.   Plan:  Continue Lovenox 40 mg SQ q2200 as ordered; next dose tomorrow evening     Bernadene Person, PharmD, BCPS [phone] 04/10/2022, 6:23 PM

## 2022-04-10 NOTE — Plan of Care (Signed)

## 2022-04-10 NOTE — Progress Notes (Signed)
Pharmacy Antibiotic Note  Thomas Raymond is a 76 y.o. male admitted on 04/07/2022 with pneumonia.  Pharmacy has been consulted for zosyn dosing.  Plan: Zosyn 3.375g IV q8 (extended interval infusion) Dosage will likely remain stable as above  and need for further dosage adjustment appears unlikely at present.  Will sign off at this time.  Please reconsult if a change in clinical status warrants re-evaluation of dosage.  Weight: 64.2 kg (141 lb 8.6 oz)  Temp (24hrs), Avg:98.6 F (37 C), Min:98 F (36.7 C), Max:99.2 F (37.3 C)  Recent Labs  Lab 04/07/22 1039 04/07/22 1318 04/07/22 1518 04/08/22 0500 04/09/22 0632 04/10/22 0523  WBC 13.0*  --   --  11.1* 11.3* 8.8  CREATININE 0.58*  --   --  0.56* 0.41* 0.36*  LATICACIDVEN  --  1.1 1.2  --   --   --      Estimated Creatinine Clearance: 62.5 mL/min (A) (by C-G formula based on SCr of 0.36 mg/dL (L)).    No Known Allergies    Thank you for allowing pharmacy to be a part of this patient's care.   Adalberto Cole, PharmD, BCPS 04/10/2022 12:19 PM

## 2022-04-10 NOTE — Sedation Documentation (Signed)
Bedside report provided to Hansel Starling, RN on 4E.

## 2022-04-11 DIAGNOSIS — I10 Essential (primary) hypertension: Secondary | ICD-10-CM | POA: Diagnosis not present

## 2022-04-11 DIAGNOSIS — G1221 Amyotrophic lateral sclerosis: Secondary | ICD-10-CM | POA: Diagnosis not present

## 2022-04-11 DIAGNOSIS — T17800D Unspecified foreign body in other parts of respiratory tract causing asphyxiation, subsequent encounter: Secondary | ICD-10-CM | POA: Diagnosis not present

## 2022-04-11 DIAGNOSIS — J9601 Acute respiratory failure with hypoxia: Secondary | ICD-10-CM | POA: Diagnosis not present

## 2022-04-11 LAB — CREATININE, SERUM
Creatinine, Ser: 0.4 mg/dL — ABNORMAL LOW (ref 0.61–1.24)
GFR, Estimated: >60 mL/min (ref >60)

## 2022-04-11 LAB — GLUCOSE, CAPILLARY: Glucose-Capillary: 170 mg/dL — ABNORMAL HIGH (ref 70–99)

## 2022-04-11 MED ORDER — OSMOLITE 1.5 CAL PO LIQD
120.0000 mL | Freq: Four times a day (QID) | ORAL | Status: DC
  Administered 2022-04-11 – 2022-04-12 (×5): 120 mL
  Filled 2022-04-11 (×7): qty 237

## 2022-04-11 MED ORDER — ADULT MULTIVITAMIN W/MINERALS CH
1.0000 | ORAL_TABLET | Freq: Every day | ORAL | Status: DC
  Administered 2022-04-12: 1
  Filled 2022-04-11: qty 1

## 2022-04-11 NOTE — Plan of Care (Signed)
Patient sitting up with yanker in hand for oral suctioning. Reports pain in left leg 9/10 and medication adminstered by iv.  Iv infusing in left ac flushes easily with med. Wife at bedside.  States stomach is ok, leg is where pain is.

## 2022-04-11 NOTE — Progress Notes (Signed)
Initial Nutrition Assessment  INTERVENTION:   Monitor magnesium, potassium, and phosphorus for at least 3 days, MD to replete as needed, as pt is at risk for refeeding syndrome.    Once PEG ready to use 12/21: 1/2 carton (120 ml) Osmolite 1.5 TID via PEG Flush with 60 ml before and after each feeding  12/22: Provide 1/2 carton (120 ml) Osmolite 1.5 QID via PEG Flush with 60 ml before and after each feeding   Advance slowly as tolerated to goal: -5 cartons Osmolite 1.5 daily -1.5 cartons ( ) BID + 1 carton daily -60 ml free water before and after each feeding -Provides 1775 kcals, 74g protein and 1265 ml H2O -Free water recommendation: 100 ml every 6 hours (400 ml)   -Multivitamin with minerals daily via tube   NUTRITION DIAGNOSIS:   Inadequate oral intake related to dysphagia as evidenced by NPO status.  Ongoing.  GOAL:   Patient will meet greater than or equal to 90% of their needs  Not meeting yet.  MONITOR:   Labs, Weight trends, I & O's, TF tolerance  REASON FOR ASSESSMENT:   Consult Enteral/tube feeding initiation and management  ASSESSMENT:   76 y.o. male with medical history significant of hypertension, ALS with wheelchair-bound /bedbound status presented from home with complaints of cough which started about a week ago. Admitted for the management of aspiration pneumonia.  Underwent bronchoscopy with finding of thick purulent secretions noted on the right and obstructing right lower lobe,no endobronchial lesions seen.  Speech therapy recommending n.p.o. and recommended PEG due to chronic severe aspiration.  12/18: bronchoscopy 12/19: MBS  RD consulted to start tube feeding today. Pt has PEG placement 12/20 via IR. Per IR note, feeds may be initiated 24 hr per primary team.  Placed orders for bolus/gravity regimen as per pt request. Will slowly advance towards goal as pt at risk of refeeding syndrome.   Admission weight: 141 lbs Daily weights ordered  via TF protocol.  Medications reviewed.  Labs reviewed.  Diet Order:   Diet Order             Diet NPO time specified  Diet effective now                   EDUCATION NEEDS:   Education needs have been addressed  Skin:  Skin Assessment: Reviewed RN Assessment  Last BM:  12/18  Height:   Ht Readings from Last 1 Encounters:  01/24/22 5\' 1"  (1.549 m)    Weight:   Wt Readings from Last 1 Encounters:  04/08/22 64.2 kg    BMI:  Body mass index is 26.74 kg/m.  Estimated Nutritional Needs:   Kcal:  1600-1800  Protein:  75-90g  Fluid:  1.8L/day  04/10/22, MS, RD, LDN Inpatient Clinical Dietitian Contact information available via Amion

## 2022-04-11 NOTE — Progress Notes (Signed)
[facility]  PROGRESS NOTE  [place] FSE:[id number] DOB: 05-05-1945 DOA: 04/07/2022 PCP: Biagio Borg, MD   Brief HPI:    76 y.o. male with medical history significant of hypertension, ALS with wheelchair-bound /bedbound status presented from home with complaints of cough which started about a week ago .On presentation, he was hemodynamically stable but was hypoxic with saturation of 86% on room air and was placed on 2 L of nasal cannula.   Chest imagings  showed right-sided pneumonia with opacification of the bronchus, right lower lobe, right middle lobe collapse.  Admitted for the management of aspiration pneumonia.  Underwent bronchoscopy with finding of thick purulent secretions noted on the right and obstructing right lower lobe,no endobronchial lesions seen.  Speech therapy recommending n.p.o. and recommended PEG due to chronic severe aspiration.   Subjective   Patient seen and examined, s/p PEG tube placement per IR yesterday   Assessment/Plan:    Acute hypoxemic respiratory failure -Secondary to aspiration pneumonia -Presented with cough, weakness and shortness of breath -Not on oxygen at home -Currently requiring 3 L/min of oxygen -Wean off oxygen as tolerated  Right-sided pneumonia/aspiration pneumonia -CT imaging showed collapse of right lower lobe and middle lobe -Currently on IV Zosyn -Follow-up blood culture results -WBC is down to 8.8 Status post bronchoscopy.Thick purulent secretions noted on the right and obstructing right lower lobe . No endobronchial lesions seen.  Secretions were lavaged and suctioned out.  Specimen obtained for BAL . Culture growing gram positive cocci. -Continue IV Zosyn  Severe dysphagia -Swallow evaluation obtained, speech therapy recommended n.p.o. -Underwent MBS; has dysphagia due to ALS -Patient and family agreeable for PEG tube -S/p  PEG tube placement per IR -will consult dietitian for initiation of tube feeds    Hypokalemia -replete  History of hypertension -Blood pressure is mildly elevated -Metoprolol was restarted,which patient was taking at home -Continue to hold HCTZ amlodipine  Hyperlipidemia -Continue Crestor  History of ALS -Bedbound/wheelchair-bound mostly -PT/OT consulted -Lives with family    Medications     enoxaparin (LOVENOX) injection  40 mg Subcutaneous Q24H   gabapentin  300 mg Per Tube TID   metoprolol tartrate  25 mg Per Tube BID   mouth rinse  15 mL Mouth Rinse 4 times per day   pantoprazole (PROTONIX) IV  40 mg Intravenous QHS   rosuvastatin  20 mg Per Tube Daily     Data Reviewed:   CBG:  No results for input(s): "GLUCAP" in the last 168 hours.  SpO2: 93 % O2 Flow Rate (L/min): 3 L/min FiO2 (%): 36 %    Vitals:   04/10/22 1610 04/10/22 1615 04/10/22 2213 04/11/22 0500  BP: (!) 135/96 (!) 152/78 (!) 149/76 131/80  Pulse: 78 89 85 81  Resp: 18 (!) 21 20 (!) 21  Temp:   98 F (36.7 C) 98.4 F (36.9 C)  TempSrc:   Oral Oral  SpO2: 94% 95% 97% 93%  Weight:          Data Reviewed:  Basic Metabolic Panel: Recent Labs  Lab 04/07/22 1039 04/07/22 1334 04/08/22 0500 04/09/22 0632 04/10/22 0523 04/11/22 0437  NA 137  --  138 140 141  --   K 2.2*  --  2.9* 2.8* 4.0  --   CL 84*  --  93* 102 106  --   CO2 37*  --  _0 --   GLUCOSE 105*  --  83 117* 94  --   BUN 14  --  13 7* 8  --   CREATININE 0.58*  --  0.56* 0.41* 0.36* 0.40*  CALCIUM 9.0  --  8.2* 7.9* 7.6*  --   MG  --  2.2  --  1.7 2.1  --   PHOS  --  3.3  --   --   --   --     CBC: Recent Labs  Lab 04/07/22 1039 04/08/22 0500 04/09/22 0632 04/10/22 0523  WBC 13.0* 11.1* 11.3* 8.8  HGB 15.2 12.0* 12.6* 11.0*  HCT 46.2 37.6* 40.4 35.8*  MCV 90.1 91.7 92.9 93.7  PLT 321 295 308 292    LFT Recent Labs  Lab 04/07/22 1318  AST 28  ALT 17  ALKPHOS 84  BILITOT 1.4*  PROT 8.0  ALBUMIN 2.9*     Antibiotics: Anti-infectives (From admission, onward)     Start     Dose/Rate Route Frequency Ordered Stop   04/07/22 1600  vancomycin (VANCOREADY) IVPB 1250 mg/250 mL  Status:  Discontinued        1,250 mg 166.7 mL/hr over 90 Minutes Intravenous Every 24 hours 04/07/22 1551 04/08/22 0814   04/07/22 1600  piperacillin-tazobactam (ZOSYN) IVPB 3.375 g        3.375 g 12.5 mL/hr over 240 Minutes Intravenous Every 8 hours 04/07/22 1551     04/07/22 1500  cefTRIAXone (ROCEPHIN) 1 g in sodium chloride 0.9 % 100 mL IVPB  Status:  Discontinued        1 g 200 mL/hr over 30 Minutes Intravenous  Once 04/07/22 1451 04/07/22 1550   04/07/22 1500  azithromycin (ZITHROMAX) 500 mg in sodium chloride 0.9 % 250 mL IVPB  Status:  Discontinued        500 mg 250 mL/hr over 60 Minutes Intravenous  Once 04/07/22 1451 04/07/22 1550   04/07/22 1500  metroNIDAZOLE (FLAGYL) IVPB 500 mg  Status:  Discontinued        500 mg 100 mL/hr over 60 Minutes Intravenous  Once 04/07/22 1451 04/07/22 1550        DVT prophylaxis: Lovenox  Code Status: Full code  Family Communication:    CONSULTS    Objective    Physical Examination:   Appears in no acute distress S1-S2, regular, no murmur auscultated Clear to auscultation bilaterally Abdomen is soft, nontender, PEG tube in place Extremities  no edema in the lower extremities  Status is: Inpatient:       Oswald Hillock   [facility] If 7PM-7AM, please contact night-coverage at [email or web address], Office  [phone]   04/11/2022, 9:46 AM  LOS: 4 days

## 2022-04-11 NOTE — Progress Notes (Signed)
Referring Physician(s): * No referring provider recorded for this case *  Supervising Physician: Irish Lack  Patient Status:  [facility] - In-pt  Chief Complaint: ALS, bedbound, severe dysphagia  Subjective: Patient lying in bed.  Complains of dry mouth.  No complaints related to G-tube.   Allergies: Patient has no known allergies.  Medications: Prior to Admission medications   Medication Sig Start Date End Date Taking? Authorizing Provider  acetaminophen (TYLENOL) 325 MG tablet Take 2 tablets (650 mg total) by mouth every 6 (six) hours as needed for mild pain or moderate pain (or temp > 100). 11/16/19  Yes Leroy Sea, MD  amLODipine (NORVASC) 10 MG tablet Take 1 tablet (10 mg total) by mouth daily. take 1 tablet (10 mg total) by mouth daily. 09/14/21  Yes Corwin Levins, MD  cetirizine (ZYRTEC) 10 MG tablet Take 1 tablet (10 mg total) by mouth daily as needed. Patient taking differently: Take 10 mg by mouth daily as needed for allergies. 09/14/21  Yes Corwin Levins, MD  gabapentin (NEURONTIN) 300 MG capsule Take 1 capsule (300 mg total) by mouth 3 (three) times daily. 09/14/21  Yes Corwin Levins, MD  hydrochlorothiazide (MICROZIDE) 12.5 MG capsule Take 1 capsule (12.5 mg total) by mouth daily. 09/14/21  Yes Corwin Levins, MD  HYDROcodone bit-homatropine (HYDROMET) 5-1.5 MG/5ML syrup Take 5 mLs by mouth every 6 (six) hours as needed for cough.   Yes [provider]  meclizine (ANTIVERT) 12.5 MG tablet TAKE 1 TABLET BY MOUTH 3 TIMES A DAY AS NEEDED FOR DIZZINESS Patient taking differently: Take 12.5 mg by mouth 3 (three) times daily as needed for dizziness. 09/14/21  Yes Corwin Levins, MD  metoprolol succinate (TOPROL-XL) 50 MG 24 hr tablet Take 1 tablet (50 mg total) by mouth daily. TAKE WITH OR IMMEDIATELY FOLLOWING A MEAL. 09/14/21  Yes Corwin Levins, MD  omeprazole (PRILOSEC) 20 MG capsule Take 1 capsule (20 mg total) by mouth daily. Must keep 09/14/21 appt for future  refills Patient taking differently: Take 20 mg by mouth daily as needed (acid reflux). Must keep 09/14/21 appt for future refills 09/14/21  Yes Corwin Levins, MD  rosuvastatin (CRESTOR) 20 MG tablet Take 1 tablet (20 mg total) by mouth daily. 09/14/21  Yes Corwin Levins, MD  triamcinolone (NASACORT) 55 MCG/ACT AERO nasal inhaler Place 2 sprays into the nose daily. Patient not taking: Reported on 04/09/2022 01/24/22   Corwin Levins, MD     Vital Signs: BP 127/67 (BP Location: Right Arm)   Pulse 63   Temp 98 F (36.7 C) (Oral)   Resp 18   Wt 141 lb 8.6 oz (64.2 kg)   SpO2 97%   BMI 26.74 kg/m   Physical Exam NAD, lying in bed.  Abdomen: G-tube in place.  Site intact without oozing or erythema.  Mild tenderness as expected post procedure.   Imaging: IR GASTROSTOMY TUBE MOD SED  Result Date: 04/10/2022 INDICATION: Dysphagia secondary to ALS. Please perform percutaneous gastrostomy tube placement for enteric nutrition supplementation purposes. EXAM: PULL TROUGH GASTROSTOMY TUBE PLACEMENT COMPARISON:  CT the chest, abdomen pelvis-04/07/2022 MEDICATIONS: The patient is currently admitted to the hospital receiving intravenous antibiotics. The antibiotics were administered within an appropriate time frame prior to the initiation of the procedure. Glucagon 1 mg IV CONTRAST:  10 mL of Omnipaque 300 administered into the gastric lumen. ANESTHESIA/SEDATION: Moderate (conscious) sedation was employed during this procedure. A total of Versed 2 mg and  Fentanyl 200 mcg was administered intravenously. Moderate Sedation Time: 30 minutes. The patient's level of consciousness and vital signs were monitored continuously by radiology nursing throughout the procedure under my direct supervision. FLUOROSCOPY TIME:  3 minutes, 12 seconds (10 mGy) COMPLICATIONS: None immediate. PROCEDURE: Informed written consent was obtained from the patient (via the use of a medical translator) following explanation of the procedure,  risks, benefits and alternatives. A time out was performed prior to the initiation of the procedure. Ultrasound scanning was performed to demarcate the edge of the left lobe of the liver. Maximal barrier sterile technique utilized including caps, mask, sterile gowns, sterile gloves, large sterile drape, hand hygiene and Betadine prep. The left upper quadrant was sterilely prepped and draped. An oral gastric catheter was inserted into the stomach under fluoroscopy. The existing nasogastric feeding tube was removed. The left costal margin and air/contrast opacified transverse colon were identified and avoided. Air was injected into the stomach for insufflation and visualization under fluoroscopy. Under sterile conditions a 17 gauge trocar needle was utilized to access the stomach percutaneously beneath the left subcostal margin after the overlying soft tissues were anesthetized with 1% Lidocaine with epinephrine. Needle position was confirmed within the stomach with aspiration of air and injection of small amount of contrast. A single T tack was deployed for gastropexy. Over an Amplatz guide wire, a 9-French sheath was inserted into the stomach. A snare device was utilized to capture the oral gastric catheter. The snare device was pulled retrograde from the stomach up the esophagus and out the oropharynx. The 20-French pull-through gastrostomy was connected to the snare device and pulled antegrade through the oropharynx down the esophagus into the stomach and then through the percutaneous tract external to the patient. The gastrostomy was assembled externally. Contrast injection confirms appropriate positioning within the stomach. Several spot radiographic images were obtained in various obliquities for documentation. Dressings were applied. The patient tolerated procedure well without immediate post procedural complication. FINDINGS: After successful fluoroscopic guided placement, the gastrostomy tube is  appropriately positioned with internal disc positioned against the inner ventral wall of the gastric lumen. IMPRESSION: Successful fluoroscopic insertion of a 20-French pull-through gastrostomy tube. The gastrostomy may be used immediately for medication administration and in 24 hrs for the initiation of feeds. Electronically Signed   By: Simonne Come M.D.   On: 04/10/2022 17:18   DG Swallowing Func-Speech Pathology  Result Date: 04/09/2022 Table formatting from the original result was not included. Objective Swallowing Evaluation: Type of Study: MBS-Modified Barium Swallow Study  Patient Details Name: Thomas Raymond MRN: [id number] Date of Birth: [birth year] Today's Date: 04/09/2022 Time: No data recorded-No data recorded No data recorded Past Medical History: Past Medical History: Diagnosis Date  ALLERGIC RHINITIS   ALS   HYPERLIPIDEMIA   HYPERTENSION   Impaired glucose tolerance 02/17/2011  LOW BACK PAIN   VERTIGO  Past Surgical History: Past Surgical History: Procedure Laterality Date  CHOLECYSTECTOMY N/A 11/15/2019  Procedure: LAPAROSCOPIC CHOLECYSTECTOMY;  Surgeon: Abigail Miyamoto, MD;  Location: [facility] OR;  Service: General;  Laterality: N/A;  COMPRESSION HIP SCREW Left 02/07/2014  Procedure: IM Nail;  Surgeon: Harvie Junior, MD;  Location: [facility] ORS;  Service: Orthopedics;  Laterality: Left;  ERCP N/A 11/14/2019  Procedure: ENDOSCOPIC RETROGRADE CHOLANGIOPANCREATOGRAPHY (ERCP);  Surgeon: Hilarie Fredrickson, MD;  Location: [facility] ENDOSCOPY;  Service: Endoscopy;  Laterality: N/A;  IR GENERIC HISTORICAL  11/21/2015  IR RADIOLOGIST EVAL & MGMT 11/21/2015 Oley Balm, MD [facility] INTERV RAD  Lipoma removal  2003  Percutaneous gastrostomy tube repalcement    04/1999 and removed 07/1999  REMOVAL OF STONES  11/14/2019  Procedure: REMOVAL OF STONES;  Surgeon: Hilarie Fredrickson, MD;  Location: [facility] ENDOSCOPY;  Service: Endoscopy;;  SPHINCTEROTOMY  11/14/2019  Procedure: Dennison Mascot;  Surgeon: Hilarie Fredrickson, MD;  Location: [facility] ENDOSCOPY;  Service:  Endoscopy;;  TRACHEOSTOMY   HPI: 76 year old [place] man with ALS, wheelchair-bound presented to ED with weakness for 1 week with shortness of breath preceded by sore throat.  Saturating 88% on room air increased to 93% on 2 L  He reported fever with chills.  He saw his doctor and was given hydrocodone cough syrup  Chest x-ray showed elevated right hemidiaphragm with volume loss on the right with right lower lobe airspace disease versus effusion  He was given ceftriaxone, hypokalemia of 2.2 was supplemented  CT chest/abdomen/pelvis showed right middle lobe consolidation and collapse of right lower lobe with consolidation, some groundglass in the right upper lobe.  There was emphysema especially in the upper lobes and calcified mediastinal lymph nodes consistent with prior granulomatous disease  PCCM consulted to opine about bronchoscopy  Swallow eval ordered.  Pt has h/o dysphagia h/o mod-severe dysphagia, mbs 2001 penetration thin/nectar - chin tuck prevent; vallecular retention - multiple swallows decreases, 2015 most recent mbs - chin tuck not help, retention- expectorate to clear effective.  Last diet recommendation was dys3/thin with strategies including multiple swallows and expectoration.  Subjective: pt awake in chair  Recommendations for follow up therapy are one component of a multi-disciplinary discharge planning process, led by the attending physician.  Recommendations may be updated based on patient status, additional functional criteria and insurance authorization. Assessment / Plan / Recommendation   04/09/2022   9:28 AM Clinical Impressions Clinical Impression Patient presents with profound pharyngeal-cervical esopahgeal dysphagia due to his ALS.  Very poor pharyngeal motilitg- impaired laryngeal elevation/closure and pharyngeal contraction/tongue base retractions results in gross pharyngeal retention that mixes with existing secretions.  He requires up to 6-7 swallows to partially clear small  amount of pharyngeal retention.   Laryngeal penetration of thin and nectar during and after swallow noted with retentionin pyriform sinus more than vallecular space.  Initially pt sensed penetration resulting in him clearing his throat and re-swallowing/expectorating.   Pharyngeal clearance is much more compromised with puree than liquids - at vallecular space more with pudding.  Pt fatigued quickly during testing after just few boluses - causing worsening weakness with poor expecoration abilities and worsening pharyngeal clearance.  Secretions retained in pharynx that mixed with barium were not cleared nor consistently sensed. Pt denies issues with throat clearing with intake prior to intake stating this worsening dysphagia is due to his current illness. Prognosis for swallow to return to functional level is guarded given his ALS however given his report of signficant worsening, he may benefit from consideration for short term alternative means of nutrition - allowing thin water by mouth and repeat MBS as pt medically improves.  Upon review of prior MBS report from 2015, *images not available*, pt with retention and frequent throat clearing and laryngeal penetration at that time - thus suspect worsening of chronic dysphagia - and possible impaired tolerance of suspected low grade chronic aspiration due to his advanced age and acute illness.  SLP Visit Diagnosis Dysphagia, oropharyngeal phase (R13.12);Dysphagia, pharyngoesophageal phase (R13.14) Impact on safety and function Severe aspiration risk;Risk for inadequate nutrition/hydration     04/09/2022   9:28 AM Treatment Recommendations Treatment Recommendations Therapy as outlined in treatment plan  below     04/09/2022   9:52 AM Prognosis Prognosis for Safe Diet Advancement Guarded Barriers to Reach Goals Severity of deficits;Time post onset   04/09/2022   9:28 AM Diet Recommendations SLP Diet Recommendations NPO;Ice chips PRN after oral care Liquid Administration  via Straw Medication Administration Via alternative means Compensations Slow rate;Small sips/bites;Multiple dry swallows after each bite/sip;Other (Comment)     04/09/2022   9:28 AM Other Recommendations Oral Care Recommendations Oral care QID Other Recommendations Have oral suction available Functional Status Assessment Patient has had a recent decline in their functional status and demonstrates the ability to make significant improvements in function in a reasonable and predictable amount of time.   04/09/2022   9:28 AM Frequency and Duration  Speech Therapy Frequency (ACUTE ONLY) min 1 x/week Treatment Duration 1 week     04/09/2022   9:20 AM Oral Phase Oral Phase Impaired Oral - Nectar Teaspoon Piecemeal swallowing;Weak lingual manipulation Oral - Nectar Straw Piecemeal swallowing;Weak lingual manipulation Oral - Thin Teaspoon Piecemeal swallowing;Weak lingual manipulation Oral - Thin Straw Piecemeal swallowing;Weak lingual manipulation Oral - Puree Piecemeal swallowing;Weak lingual manipulation;Lingual pumping Oral - Mech Soft NT    04/09/2022   9:25 AM Pharyngeal Phase Pharyngeal Phase Impaired Pharyngeal- Nectar Teaspoon Reduced laryngeal elevation;Reduced airway/laryngeal closure;Reduced tongue base retraction;Pharyngeal residue - valleculae;Pharyngeal residue - pyriform;Trace aspiration;Penetration/Aspiration during swallow;Penetration/Apiration after swallow Pharyngeal Material enters airway, remains ABOVE vocal cords then ejected out Pharyngeal- Nectar Straw Reduced laryngeal elevation;Reduced airway/laryngeal closure;Reduced tongue base retraction;Penetration/Aspiration before swallow;Pharyngeal residue - pyriform;Pharyngeal residue - valleculae;Trace aspiration;Penetration/Aspiration during swallow;Pharyngeal residue - posterior pharnyx;Penetration/Apiration after swallow Pharyngeal Material enters airway, remains ABOVE vocal cords and not ejected out;Material enters airway, CONTACTS cords and then  ejected out Pharyngeal- Thin Teaspoon Reduced laryngeal elevation;Reduced airway/laryngeal closure;Reduced tongue base retraction;Penetration/Aspiration during swallow;Pharyngeal residue - cp segment;Pharyngeal residue - posterior pharnyx;Pharyngeal residue - pyriform;Pharyngeal residue - valleculae;Reduced anterior laryngeal mobility;Penetration/Apiration after swallow Pharyngeal Material enters airway, remains ABOVE vocal cords and not ejected out;Material enters airway, CONTACTS cords and then ejected out Pharyngeal- Thin Straw Reduced pharyngeal peristalsis;Reduced epiglottic inversion;Reduced anterior laryngeal mobility;Reduced laryngeal elevation;Reduced airway/laryngeal closure;Reduced tongue base retraction;Pharyngeal residue - valleculae;Pharyngeal residue - pyriform Pharyngeal Material enters airway, remains ABOVE vocal cords and not ejected out Pharyngeal- Puree Reduced pharyngeal peristalsis;Reduced epiglottic inversion;Reduced anterior laryngeal mobility;Reduced laryngeal elevation;Reduced airway/laryngeal closure;Reduced tongue base retraction;Pharyngeal residue - valleculae;Pharyngeal residue - pyriform Pharyngeal Material does not enter airway Pharyngeal Comment retention spilled to distal phayrnx after swallow attempts. pharyngeal retention was propelled into oral cavity with verbal cues to allow SLP to orally suction to clear    04/09/2022   9:27 AM Cervical Esophageal Phase  Cervical Esophageal Phase Impaired Cervical Esophageal Comment impaired UES opening resulting gross retention - across all consistencies Rolena Infante, MS [facility] SLP Acute Rehab Services Office [phone] Pager [phone] Chales Abrahams 04/09/2022, 12:29 PM                     Portable chest 1 View  Result Date: 04/08/2022 CLINICAL DATA:  [id number] with shortness of breath. EXAM: PORTABLE CHEST 1 VIEW COMPARISON:  PA Lat and chest CT both 04/07/2022 FINDINGS: 7:03 a.m. There is dense consolidation right middle and lower  lobes and volume loss. Overall aeration is unchanged. Remaining lungs are clear. There is mild cardiomegaly. No findings of CHF. Aortic atherosclerosis with stable mediastinum. Multiple surgical clips right neck base. Osteopenia and chronic healed fracture proximal right humerus. IMPRESSION: Persistent dense consolidation and volume loss right middle and lower lobes.  No change from yesterday's studies. Electronically Signed   By: Almira Bar M.D.   On: 04/08/2022 07:22   CT CHEST ABDOMEN PELVIS W CONTRAST  Result Date: 04/07/2022 CLINICAL DATA:  Cough.  Sepsis. EXAM: CT CHEST, ABDOMEN, AND PELVIS WITH CONTRAST TECHNIQUE: Multidetector CT imaging of the chest, abdomen and pelvis was performed following the standard protocol during bolus administration of intravenous contrast. RADIATION DOSE REDUCTION: This exam was performed according to the departmental dose-optimization program which includes automated exposure control, adjustment of the mA and/or kV according to patient size and/or use of iterative reconstruction technique. CONTRAST:  OMNIPAQUE IOHEXOL 300 MG/ML  SOLN COMPARISON:  CT the abdomen and pelvis November 12, 2019. Chest x-ray April 07, 2022. Chest x-ray October 19, 2015. FINDINGS: CT CHEST FINDINGS Cardiovascular: Heart size is normal. Calcified atherosclerotic changes are identified in the nonaneurysmal aorta. No dissection. Central pulmonary arteries are normal in caliber. No central pulmonary artery filling defects. Mediastinum/Nodes: The thyroid and esophagus are normal. No pleural or pericardial effusions. Few partially calcified lymph nodes are identified in the mediastinum consistent with previous granulomatous disease. No suspicious adenopathy is identified. The chest wall is normal. Lungs/Pleura: The trachea and mainstem bronchi are are well aerated. There is opacification of the bronchus intermedius and right lower lobe bronchus. No pneumothorax. Emphysematous changes identified in  the lungs, particularly the upper lobes. A nodule in the medial right apex on series 7, image 26 is unchanged since June 2017, of no significance. Scattered opacities are identified throughout the right upper lobe, primarily ground glass and nodular in nature. The right middle lobe is largely collapsed with air bronchograms. There is complete collapse of the right lower lobe. No mass identified. Mild atelectasis in the left base. No suspicious nodules, masses, or infiltrates identified in the left lung. Musculoskeletal: No chest wall mass or suspicious bone lesions identified. CT ABDOMEN PELVIS FINDINGS Hepatobiliary: The patient is status post cholecystectomy. No liver masses identified. The portal vein is patent. Pancreas: Unremarkable. No pancreatic ductal dilatation or surrounding inflammatory changes. Spleen: Normal in size without focal abnormality. Adrenals/Urinary Tract: Adrenal glands are unremarkable. Kidneys are normal, without renal calculi, focal lesion, or hydronephrosis. Bladder is unremarkable. Stomach/Bowel: Stomach is within normal limits. Appendix appears normal. No evidence of bowel wall thickening, distention, or inflammatory changes. Vascular/Lymphatic: Calcified atherosclerotic changes are identified in the nonaneurysmal aorta, iliac vessels, and femoral vessels. No adenopathy. Reproductive: Prostate is unremarkable. Other: No abdominal wall hernia or abnormality. No abdominopelvic ascites. Musculoskeletal: No acute or significant osseous findings. IMPRESSION: 1. There is opacification of the bronchus intermedius and right lower lobe bronchus with complete collapse of the right lower lobe. The right middle lobe is largely collapsed with air bronchograms. The constellation of findings could be due to mucous plugging or aspiration. Recommend short-term follow-up imaging imaging to ensure resolution. 2. Scattered opacities in the right upper lobe are likely infection/pneumonia. Recommend  attention on follow-up. 3. Emphysematous changes in the lungs. 4. Calcified atherosclerotic changes in the nonaneurysmal aorta, iliac vessels, and femoral vessels. 5. No other acute abnormalities. 6. Aortic atherosclerosis. Aortic Atherosclerosis (ICD10-I70.0) and Emphysema (ICD10-J43.9). Electronically Signed   By: Gerome Sam III M.D.   On: 04/07/2022 14:24    Labs:  CBC: Recent Labs    04/07/22 1039 04/08/22 0500 04/09/22 0632 04/10/22 0523  WBC 13.0* 11.1* 11.3* 8.8  HGB 15.2 12.0* 12.6* 11.0*  HCT 46.2 37.6* 40.4 35.8*  PLT 321 295 308 292    COAGS: Recent Labs  04/10/22 0523  INR 1.0    BMP: Recent Labs    04/07/22 1039 04/08/22 0500 04/09/22 0632 04/10/22 0523 04/11/22 0437  NA 137 138 140 141  --   K 2.2* 2.9* 2.8* 4.0  --   CL 84* 93* 102 106  --   CO2 37* 30 29 26   --   GLUCOSE 105* 83 117* 94  --   BUN 14 13 7* 8  --   CALCIUM 9.0 8.2* 7.9* 7.6*  --   CREATININE 0.58* 0.56* 0.41* 0.36* 0.40*  GFRNONAA >60 >60 >60 >60 >60    LIVER FUNCTION TESTS: Recent Labs    09/14/21 1337 04/07/22 1318  BILITOT 0.4 1.4*  AST 26 28  ALT 26 17  ALKPHOS 67 84  PROT 8.0 8.0  ALBUMIN 4.3 2.9*    Assessment and Plan: ALS, bound bed, severe dysphagia s/p G-tube placement 04/10/22 by Dr. Grace IsaacWatts.  Patient s/p G-tube placement yesterday.  Site assessed today.  No issues noted.  Mild tenderness as expected post-procedure.  Spoke with RN, tube ready for use.   Electronically Signed: Hoyt KochKacie Sue-Ellen Huie Ghuman, PA 04/11/2022, 1:43 PM   I spent a total of 15 Minutes at the the patient's bedside AND on the patient's hospital floor or unit, greater than 50% of which was counseling/coordinating care for ALS, dysphagia.

## 2022-04-12 DIAGNOSIS — G1221 Amyotrophic lateral sclerosis: Secondary | ICD-10-CM | POA: Diagnosis not present

## 2022-04-12 DIAGNOSIS — I10 Essential (primary) hypertension: Secondary | ICD-10-CM | POA: Diagnosis not present

## 2022-04-12 DIAGNOSIS — T17800D Unspecified foreign body in other parts of respiratory tract causing asphyxiation, subsequent encounter: Secondary | ICD-10-CM | POA: Diagnosis not present

## 2022-04-12 DIAGNOSIS — E876 Hypokalemia: Secondary | ICD-10-CM

## 2022-04-12 DIAGNOSIS — J9601 Acute respiratory failure with hypoxia: Secondary | ICD-10-CM | POA: Diagnosis not present

## 2022-04-12 LAB — CULTURE, BLOOD (ROUTINE X 2)
Culture: NO GROWTH
Culture: NO GROWTH
Special Requests: ADEQUATE
Special Requests: ADEQUATE

## 2022-04-12 LAB — GLUCOSE, CAPILLARY
Glucose-Capillary: 121 mg/dL — ABNORMAL HIGH (ref 70–99)
Glucose-Capillary: 143 mg/dL — ABNORMAL HIGH (ref 70–99)
Glucose-Capillary: 130 mg/dL — ABNORMAL HIGH (ref 70–99)

## 2022-04-12 LAB — CREATININE, SERUM
Creatinine, Ser: 0.3 mg/dL — ABNORMAL LOW (ref 0.61–1.24)
GFR, Estimated: >60 mL/min (ref >60)

## 2022-04-12 LAB — PHOSPHORUS: Phosphorus: 2.7 mg/dL (ref 2.5–4.6)

## 2022-04-12 LAB — MAGNESIUM: Magnesium: 2.1 mg/dL (ref 1.7–2.4)

## 2022-04-12 MED ORDER — ACETAMINOPHEN 325 MG PO TABS: 650.0000 mg | ORAL_TABLET | Freq: Four times a day (QID) | ORAL | 0 refills | Status: AC | PRN

## 2022-04-12 MED ORDER — LOPERAMIDE HCL 1 MG/7.5ML PO SUSP
2.0000 mg | ORAL | Status: DC | PRN
  Administered 2022-04-12: 2 mg
  Filled 2022-04-12 (×2): qty 15

## 2022-04-12 MED ORDER — FLUCONAZOLE 40 MG/ML PO SUSR
100.0000 mg | Freq: Every day | ORAL | Status: DC
  Administered 2022-04-12: 100 mg
  Filled 2022-04-12: qty 10

## 2022-04-12 MED ORDER — ROSUVASTATIN CALCIUM 20 MG PO TABS: 20.0000 mg | ORAL_TABLET | Freq: Every day | ORAL | Status: DC

## 2022-04-12 MED ORDER — HYDROCODONE BIT-HOMATROP MBR 5-1.5 MG/5ML PO SOLN: 5.0000 mL | Freq: Four times a day (QID) | ORAL | 0 refills | Status: DC | PRN

## 2022-04-12 MED ORDER — FLUCONAZOLE 10 MG/ML PO SUSR: 100.0000 mg | Freq: Every day | ORAL | 0 refills | Status: AC

## 2022-04-12 MED ORDER — LOPERAMIDE HCL 1 MG/7.5ML PO SUSP: 2.0000 mg | Freq: Four times a day (QID) | ORAL | 0 refills | Status: DC | PRN

## 2022-04-12 MED ORDER — CETIRIZINE HCL 10 MG PO TABS: 10.0000 mg | ORAL_TABLET | Freq: Every day | ORAL | 3 refills | Status: DC | PRN

## 2022-04-12 MED ORDER — METOPROLOL TARTRATE 25 MG/10 ML ORAL SUSPENSION: 25.0000 mg | Freq: Two times a day (BID) | ORAL | 2 refills | Status: DC

## 2022-04-12 MED ORDER — GABAPENTIN 250 MG/5ML PO SOLN: 300.0000 mg | Freq: Three times a day (TID) | ORAL | 12 refills | Status: DC

## 2022-04-12 MED ORDER — OMEPRAZOLE 20 MG PO CPDR: 20.0000 mg | DELAYED_RELEASE_CAPSULE | Freq: Every day | ORAL | 3 refills | Status: DC

## 2022-04-12 NOTE — Discharge Summary (Signed)
Physician Discharge Summary   Patient: Thomas Raymond MRN: [id number] DOB: December 19, 1945  Admit date:     04/07/2022  Discharge date: 04/12/22  Discharge Physician: Oswald Hillock   PCP: Biagio Borg, MD   Recommendations at discharge:   Follow-up PCP in 1 week Follow culture from the bronchial washing as outpatient  Discharge Diagnoses: Principal Problem:   Aspiration into lower respiratory tract Active Problems:   ALS   Essential hypertension   Hypokalemia   Acute hypoxemic respiratory failure (HCC)   Pneumonia of right middle lobe due to infectious organism  Resolved Problems:   * No resolved hospital problems. *  Hospital Course:   76 y.o. male with medical history significant of hypertension, ALS with wheelchair-bound /bedbound status presented from home with complaints of cough which started about a week ago .On presentation, he was hemodynamically stable but was hypoxic with saturation of 86% on room air and was placed on 2 L of nasal cannula.    Chest imagings  showed right-sided pneumonia with opacification of the bronchus, right lower lobe, right middle lobe collapse.  Admitted for the management of aspiration pneumonia.  Underwent bronchoscopy with finding of thick purulent secretions noted on the right and obstructing right lower lobe,no endobronchial lesions seen.  Speech therapy recommending n.p.o. and recommended PEG due to chronic severe aspiration.     Assessment and Plan:  Acute hypoxemic respiratory failure -Secondary to aspiration pneumonia -Presented with cough, weakness and shortness of breath -Not on oxygen at home -He was requiring 3 L of oxygen which has been weaned off    Right-sided pneumonia/aspiration pneumonia -CT imaging showed collapse of right lower lobe and middle lobe -Currently on IV Zosyn -Follow-up blood culture results -WBC is down to 8.8 Status post bronchoscopy.Thick purulent secretions noted on the right and obstructing right lower lobe  . No endobronchial lesions seen.  Secretions were lavaged and suctioned out.  Specimen obtained for BAL . Culture growing gram positive cocci. -Treated with IV Zosyn for 5 days -Will discharge home on p.o. Diflucan 100 mg for 7 days as culture from BAL grew Candida albicans   Severe dysphagia -Swallow evaluation obtained, speech therapy recommended n.p.o. -Underwent MBS; has dysphagia due to ALS -Patient and family agreeable for PEG tube -S/p  PEG tube placement per IR -Patient to be started on Osmolite 1.5 cans, 5 times a day   Hypokalemia -replete   History of hypertension -Blood pressure is mildly elevated -Metoprolol was restarted,which patient was taking at home -Continue to hold HCTZ, amlodipine   Hyperlipidemia -Continue Crestor   History of ALS -Bedbound/wheelchair-bound mostly -Lives with family       Consultants: PCCM Procedures performed: Bronchoscopy, PEG tube placement per IR Disposition: Home Diet recommendation:  Discharge Diet Orders (From admission, onward)     Start     Ordered   04/12/22 0000  Diet - low sodium heart healthy        04/12/22 1354           Started on tube feedings DISCHARGE MEDICATION: Allergies as of 04/12/2022   No Known Allergies      Medication List     STOP taking these medications    amLODipine 10 MG tablet Commonly known as: NORVASC   gabapentin 300 MG capsule Commonly known as: Neurontin Replaced by: gabapentin 250 MG/5ML solution   hydrochlorothiazide 12.5 MG capsule Commonly known as: MICROZIDE   meclizine 12.5 MG tablet Commonly known as: ANTIVERT   metoprolol succinate 50 MG  24 hr tablet Commonly known as: TOPROL-XL       TAKE these medications    acetaminophen 325 MG tablet Commonly known as: TYLENOL Take 2 tablets (650 mg total) by mouth every 6 (six) hours as needed for mild pain or moderate pain (or temp > 100). Take via tube What changed: additional instructions   cetirizine 10 MG  tablet Commonly known as: ZYRTEC Take 1 tablet (10 mg total) by mouth daily as needed. Take via tube What changed: additional instructions   fluconazole 10 MG/ML suspension Commonly known as: DIFLUCAN Place 10 mLs (100 mg total) into feeding tube daily for 7 days.   gabapentin 250 MG/5ML solution Commonly known as: NEURONTIN Place 6 mLs (300 mg total) into feeding tube 3 (three) times daily. Replaces: gabapentin 300 MG capsule   HYDROcodone bit-homatropine 5-1.5 MG/5ML syrup Commonly known as: Hydromet Take 5 mLs by mouth every 6 (six) hours as needed for cough. Take via tube What changed: additional instructions   loperamide HCl 1 MG/7.5ML suspension Commonly known as: IMODIUM Place 15 mLs (2 mg total) into feeding tube every 6 (six) hours as needed for diarrhea or loose stools.   metoprolol tartrate 25 mg/10 mL Susp Commonly known as: LOPRESSOR Place 10 mLs (25 mg total) into feeding tube 2 (two) times daily.   omeprazole 20 MG capsule Commonly known as: PRILOSEC Take 1 capsule (20 mg total) by mouth daily. Must keep 09/14/21 appt for future refills Take via tube What changed: additional instructions   rosuvastatin 20 MG tablet Commonly known as: CRESTOR Place 1 tablet (20 mg total) into feeding tube daily. Start taking on: April 13, 2022 What changed: how to take this   triamcinolone 55 MCG/ACT Aero nasal inhaler Commonly known as: NASACORT Place 2 sprays into the nose daily.        Discharge Exam: Filed Weights   04/08/22 1459 04/12/22 0525  Weight: 64.2 kg 62.9 kg   General-appears in no acute distress Heart-S1-S2, regular, no murmur auscultated Lungs-clear to auscultation bilaterally, no wheezing or crackles auscultated Abdomen-soft, nontender, no organomegaly Extremities-no edema in the lower extremities Neuro-alert, oriented x3, no focal deficit noted  Condition at discharge: good  The results of significant diagnostics from this hospitalization  (including imaging, microbiology, ancillary and laboratory) are listed below for reference.   Imaging Studies: IR GASTROSTOMY TUBE MOD SED  Result Date: 04/10/2022 INDICATION: Dysphagia secondary to ALS. Please perform percutaneous gastrostomy tube placement for enteric nutrition supplementation purposes. EXAM: PULL TROUGH GASTROSTOMY TUBE PLACEMENT COMPARISON:  CT the chest, abdomen pelvis-04/07/2022 MEDICATIONS: The patient is currently admitted to the hospital receiving intravenous antibiotics. The antibiotics were administered within an appropriate time frame prior to the initiation of the procedure. Glucagon 1 mg IV CONTRAST:  10 mL of Omnipaque 300 administered into the gastric lumen. ANESTHESIA/SEDATION: Moderate (conscious) sedation was employed during this procedure. A total of Versed 2 mg and Fentanyl 200 mcg was administered intravenously. Moderate Sedation Time: 30 minutes. The patient's level of consciousness and vital signs were monitored continuously by radiology nursing throughout the procedure under my direct supervision. FLUOROSCOPY TIME:  3 minutes, 12 seconds (10 mGy) COMPLICATIONS: None immediate. PROCEDURE: Informed written consent was obtained from the patient (via the use of a medical translator) following explanation of the procedure, risks, benefits and alternatives. A time out was performed prior to the initiation of the procedure. Ultrasound scanning was performed to demarcate the edge of the left lobe of the liver. Maximal barrier sterile technique utilized including  caps, mask, sterile gowns, sterile gloves, large sterile drape, hand hygiene and Betadine prep. The left upper quadrant was sterilely prepped and draped. An oral gastric catheter was inserted into the stomach under fluoroscopy. The existing nasogastric feeding tube was removed. The left costal margin and air/contrast opacified transverse colon were identified and avoided. Air was injected into the stomach for  insufflation and visualization under fluoroscopy. Under sterile conditions a 17 gauge trocar needle was utilized to access the stomach percutaneously beneath the left subcostal margin after the overlying soft tissues were anesthetized with 1% Lidocaine with epinephrine. Needle position was confirmed within the stomach with aspiration of air and injection of small amount of contrast. A single T tack was deployed for gastropexy. Over an Amplatz guide wire, a 9-French sheath was inserted into the stomach. A snare device was utilized to capture the oral gastric catheter. The snare device was pulled retrograde from the stomach up the esophagus and out the oropharynx. The 20-French pull-through gastrostomy was connected to the snare device and pulled antegrade through the oropharynx down the esophagus into the stomach and then through the percutaneous tract external to the patient. The gastrostomy was assembled externally. Contrast injection confirms appropriate positioning within the stomach. Several spot radiographic images were obtained in various obliquities for documentation. Dressings were applied. The patient tolerated procedure well without immediate post procedural complication. FINDINGS: After successful fluoroscopic guided placement, the gastrostomy tube is appropriately positioned with internal disc positioned against the inner ventral wall of the gastric lumen. IMPRESSION: Successful fluoroscopic insertion of a 20-French pull-through gastrostomy tube. The gastrostomy may be used immediately for medication administration and in 24 hrs for the initiation of feeds. Electronically Signed   By: Sandi Mariscal M.D.   On: 04/10/2022 17:18   DG Swallowing Func-Speech Pathology  Result Date: 04/09/2022 Table formatting from the original result was not included. Objective Swallowing Evaluation: Type of Study: MBS-Modified Barium Swallow Study  Patient Details Name: Thomas Raymond MRN: [id number] Date of Birth: May 28, 1945  Today's Date: 04/09/2022 Time: No data recorded-No data recorded No data recorded Past Medical History: Past Medical History: Diagnosis Date  ALLERGIC RHINITIS   ALS   HYPERLIPIDEMIA   HYPERTENSION   Impaired glucose tolerance 02/17/2011  LOW BACK PAIN   VERTIGO  Past Surgical History: Past Surgical History: Procedure Laterality Date  CHOLECYSTECTOMY N/A 11/15/2019  Procedure: LAPAROSCOPIC CHOLECYSTECTOMY;  Surgeon: Coralie Keens, MD;  Location: [facility];  Service: General;  Laterality: N/A;  COMPRESSION HIP SCREW Left 02/07/2014  Procedure: IM Nail;  Surgeon: Alta Corning, MD;  Location: [facility];  Service: Orthopedics;  Laterality: Left;  ERCP N/A 11/14/2019  Procedure: ENDOSCOPIC RETROGRADE CHOLANGIOPANCREATOGRAPHY (ERCP);  Surgeon: Irene Shipper, MD;  Location: [facility] ENDOSCOPY;  Service: Endoscopy;  Laterality: N/A;  IR GENERIC HISTORICAL  11/21/2015  IR RADIOLOGIST EVAL & MGMT 11/21/2015 Arne Cleveland, MD [facility] INTERV RAD  Lipoma removal  2003  Percutaneous gastrostomy tube repalcement    04/1999 and removed 07/1999  REMOVAL OF STONES  11/14/2019  Procedure: REMOVAL OF STONES;  Surgeon: Irene Shipper, MD;  Location: [facility];  Service: Endoscopy;;  Joan Mayans  11/14/2019  Procedure: Joan Mayans;  Surgeon: Irene Shipper, MD;  Location: [facility] ENDOSCOPY;  Service: Endoscopy;;  TRACHEOSTOMY   HPI: 76 year old 70 man with ALS, wheelchair-bound presented to ED with weakness for 1 week with shortness of breath preceded by sore throat.  Saturating 88% on room air increased to 93% on 2 L  He reported fever with chills.  He  saw his doctor and was given hydrocodone cough syrup  Chest x-ray showed elevated right hemidiaphragm with volume loss on the right with right lower lobe airspace disease versus effusion  He was given ceftriaxone, hypokalemia of 2.2 was supplemented  CT chest/abdomen/pelvis showed right middle lobe consolidation and collapse of right lower lobe with consolidation, some groundglass in the right  upper lobe.  There was emphysema especially in the upper lobes and calcified mediastinal lymph nodes consistent with prior granulomatous disease  PCCM consulted to opine about bronchoscopy  Swallow eval ordered.  Pt has h/o dysphagia h/o mod-severe dysphagia, mbs 2001 penetration thin/nectar - chin tuck prevent; vallecular retention - multiple swallows decreases, 2015 most recent mbs - chin tuck not help, retention- expectorate to clear effective.  Last diet recommendation was dys3/thin with strategies including multiple swallows and expectoration.  Subjective: pt awake in chair  Recommendations for follow up therapy are one component of a multi-disciplinary discharge planning process, led by the attending physician.  Recommendations may be updated based on patient status, additional functional criteria and insurance authorization. Assessment / Plan / Recommendation   04/09/2022   9:28 AM Clinical Impressions Clinical Impression Patient presents with profound pharyngeal-cervical esopahgeal dysphagia due to his ALS.  Very poor pharyngeal motilitg- impaired laryngeal elevation/closure and pharyngeal contraction/tongue base retractions results in gross pharyngeal retention that mixes with existing secretions.  He requires up to 6-7 swallows to partially clear small amount of pharyngeal retention.   Laryngeal penetration of thin and nectar during and after swallow noted with retentionin pyriform sinus more than vallecular space.  Initially pt sensed penetration resulting in him clearing his throat and re-swallowing/expectorating.   Pharyngeal clearance is much more compromised with puree than liquids - at vallecular space more with pudding.  Pt fatigued quickly during testing after just few boluses - causing worsening weakness with poor expecoration abilities and worsening pharyngeal clearance.  Secretions retained in pharynx that mixed with barium were not cleared nor consistently sensed. Pt denies issues with throat  clearing with intake prior to intake stating this worsening dysphagia is due to his current illness. Prognosis for swallow to return to functional level is guarded given his ALS however given his report of signficant worsening, he may benefit from consideration for short term alternative means of nutrition - allowing thin water by mouth and repeat MBS as pt medically improves.  Upon review of prior MBS report from 2015, *images not available*, pt with retention and frequent throat clearing and laryngeal penetration at that time - thus suspect worsening of chronic dysphagia - and possible impaired tolerance of suspected low grade chronic aspiration due to his advanced age and acute illness.  SLP Visit Diagnosis Dysphagia, oropharyngeal phase (R13.12);Dysphagia, pharyngoesophageal phase (R13.14) Impact on safety and function Severe aspiration risk;Risk for inadequate nutrition/hydration     04/09/2022   9:28 AM Treatment Recommendations Treatment Recommendations Therapy as outlined in treatment plan below     04/09/2022   9:52 AM Prognosis Prognosis for Safe Diet Advancement Guarded Barriers to Reach Goals Severity of deficits;Time post onset   04/09/2022   9:28 AM Diet Recommendations SLP Diet Recommendations NPO;Ice chips PRN after oral care Liquid Administration via Straw Medication Administration Via alternative means Compensations Slow rate;Small sips/bites;Multiple dry swallows after each bite/sip;Other (Comment)     04/09/2022   9:28 AM Other Recommendations Oral Care Recommendations Oral care QID Other Recommendations Have oral suction available Functional Status Assessment Patient has had a recent decline in their functional status and demonstrates the  ability to make significant improvements in function in a reasonable and predictable amount of time.   04/09/2022   9:28 AM Frequency and Duration  Speech Therapy Frequency (ACUTE ONLY) min 1 x/week Treatment Duration 1 week     04/09/2022   9:20 AM Oral Phase  Oral Phase Impaired Oral - Nectar Teaspoon Piecemeal swallowing;Weak lingual manipulation Oral - Nectar Straw Piecemeal swallowing;Weak lingual manipulation Oral - Thin Teaspoon Piecemeal swallowing;Weak lingual manipulation Oral - Thin Straw Piecemeal swallowing;Weak lingual manipulation Oral - Puree Piecemeal swallowing;Weak lingual manipulation;Lingual pumping Oral - Mech Soft NT    04/09/2022   9:25 AM Pharyngeal Phase Pharyngeal Phase Impaired Pharyngeal- Nectar Teaspoon Reduced laryngeal elevation;Reduced airway/laryngeal closure;Reduced tongue base retraction;Pharyngeal residue - valleculae;Pharyngeal residue - pyriform;Trace aspiration;Penetration/Aspiration during swallow;Penetration/Apiration after swallow Pharyngeal Material enters airway, remains ABOVE vocal cords then ejected out Pharyngeal- Nectar Straw Reduced laryngeal elevation;Reduced airway/laryngeal closure;Reduced tongue base retraction;Penetration/Aspiration before swallow;Pharyngeal residue - pyriform;Pharyngeal residue - valleculae;Trace aspiration;Penetration/Aspiration during swallow;Pharyngeal residue - posterior pharnyx;Penetration/Apiration after swallow Pharyngeal Material enters airway, remains ABOVE vocal cords and not ejected out;Material enters airway, CONTACTS cords and then ejected out Pharyngeal- Thin Teaspoon Reduced laryngeal elevation;Reduced airway/laryngeal closure;Reduced tongue base retraction;Penetration/Aspiration during swallow;Pharyngeal residue - cp segment;Pharyngeal residue - posterior pharnyx;Pharyngeal residue - pyriform;Pharyngeal residue - valleculae;Reduced anterior laryngeal mobility;Penetration/Apiration after swallow Pharyngeal Material enters airway, remains ABOVE vocal cords and not ejected out;Material enters airway, CONTACTS cords and then ejected out Pharyngeal- Thin Straw Reduced pharyngeal peristalsis;Reduced epiglottic inversion;Reduced anterior laryngeal mobility;Reduced laryngeal elevation;Reduced  airway/laryngeal closure;Reduced tongue base retraction;Pharyngeal residue - valleculae;Pharyngeal residue - pyriform Pharyngeal Material enters airway, remains ABOVE vocal cords and not ejected out Pharyngeal- Puree Reduced pharyngeal peristalsis;Reduced epiglottic inversion;Reduced anterior laryngeal mobility;Reduced laryngeal elevation;Reduced airway/laryngeal closure;Reduced tongue base retraction;Pharyngeal residue - valleculae;Pharyngeal residue - pyriform Pharyngeal Material does not enter airway Pharyngeal Comment retention spilled to distal phayrnx after swallow attempts. pharyngeal retention was propelled into oral cavity with verbal cues to allow SLP to orally suction to clear    04/09/2022   9:27 AM Cervical Esophageal Phase  Cervical Esophageal Phase Impaired Cervical Esophageal Comment impaired UES opening resulting gross retention - across all consistencies Kathleen Lime, MS [facility] SLP Acute Rehab Services Office [phone] Pager [phone] Macario Golds 04/09/2022, 12:29 PM                     Portable chest 1 View  Result Date: 04/08/2022 CLINICAL DATA:  [id number] with shortness of breath. EXAM: PORTABLE CHEST 1 VIEW COMPARISON:  PA Lat and chest CT both 04/07/2022 FINDINGS: 7:03 a.m. There is dense consolidation right middle and lower lobes and volume loss. Overall aeration is unchanged. Remaining lungs are clear. There is mild cardiomegaly. No findings of CHF. Aortic atherosclerosis with stable mediastinum. Multiple surgical clips right neck base. Osteopenia and chronic healed fracture proximal right humerus. IMPRESSION: Persistent dense consolidation and volume loss right middle and lower lobes. No change from yesterday's studies. Electronically Signed   By: Telford Nab M.D.   On: 04/08/2022 07:22   CT CHEST ABDOMEN PELVIS W CONTRAST  Result Date: 04/07/2022 CLINICAL DATA:  Cough.  Sepsis. EXAM: CT CHEST, ABDOMEN, AND PELVIS WITH CONTRAST TECHNIQUE: Multidetector CT imaging of the  chest, abdomen and pelvis was performed following the standard protocol during bolus administration of intravenous contrast. RADIATION DOSE REDUCTION: This exam was performed according to the departmental dose-optimization program which includes automated exposure control, adjustment of the mA and/or kV according to patient size and/or use of iterative reconstruction technique. CONTRAST:  154m OMNIPAQUE IOHEXOL 300 MG/ML  SOLN COMPARISON:  CT the abdomen and pelvis November 12, 2019. Chest x-ray April 07, 2022. Chest x-ray October 19, 2015. FINDINGS: CT CHEST FINDINGS Cardiovascular: Heart size is normal. Calcified atherosclerotic changes are identified in the nonaneurysmal aorta. No dissection. Central pulmonary arteries are normal in caliber. No central pulmonary artery filling defects. Mediastinum/Nodes: The thyroid and esophagus are normal. No pleural or pericardial effusions. Few partially calcified lymph nodes are identified in the mediastinum consistent with previous granulomatous disease. No suspicious adenopathy is identified. The chest wall is normal. Lungs/Pleura: The trachea and mainstem bronchi are are well aerated. There is opacification of the bronchus intermedius and right lower lobe bronchus. No pneumothorax. Emphysematous changes identified in the lungs, particularly the upper lobes. A nodule in the medial right apex on series 7, image 26 is unchanged since June 2017, of no significance. Scattered opacities are identified throughout the right upper lobe, primarily ground glass and nodular in nature. The right middle lobe is largely collapsed with air bronchograms. There is complete collapse of the right lower lobe. No mass identified. Mild atelectasis in the left base. No suspicious nodules, masses, or infiltrates identified in the left lung. Musculoskeletal: No chest wall mass or suspicious bone lesions identified. CT ABDOMEN PELVIS FINDINGS Hepatobiliary: The patient is status post cholecystectomy.  No liver masses identified. The portal vein is patent. Pancreas: Unremarkable. No pancreatic ductal dilatation or surrounding inflammatory changes. Spleen: Normal in size without focal abnormality. Adrenals/Urinary Tract: Adrenal glands are unremarkable. Kidneys are normal, without renal calculi, focal lesion, or hydronephrosis. Bladder is unremarkable. Stomach/Bowel: Stomach is within normal limits. Appendix appears normal. No evidence of bowel wall thickening, distention, or inflammatory changes. Vascular/Lymphatic: Calcified atherosclerotic changes are identified in the nonaneurysmal aorta, iliac vessels, and femoral vessels. No adenopathy. Reproductive: Prostate is unremarkable. Other: No abdominal wall hernia or abnormality. No abdominopelvic ascites. Musculoskeletal: No acute or significant osseous findings. IMPRESSION: 1. There is opacification of the bronchus intermedius and right lower lobe bronchus with complete collapse of the right lower lobe. The right middle lobe is largely collapsed with air bronchograms. The constellation of findings could be due to mucous plugging or aspiration. Recommend short-term follow-up imaging imaging to ensure resolution. 2. Scattered opacities in the right upper lobe are likely infection/pneumonia. Recommend attention on follow-up. 3. Emphysematous changes in the lungs. 4. Calcified atherosclerotic changes in the nonaneurysmal aorta, iliac vessels, and femoral vessels. 5. No other acute abnormalities. 6. Aortic atherosclerosis. Aortic Atherosclerosis (ICD10-I70.0) and Emphysema (ICD10-J43.9). Electronically Signed   By: DDorise BullionIII M.D.   On: 04/07/2022 14:24   DG Chest 2 View  Result Date: 04/07/2022 CLINICAL DATA:  Hypoxia.  Increased weakness. EXAM: CHEST - 2 VIEW COMPARISON:  One-view chest x-ray 11/11/2019 FINDINGS: Heart is mildly enlarged. Chronic elevation the right hemidiaphragm noted. Mild pulmonary vascular congestion is present. A right pleural  effusion is present. Asymmetric right basilar airspace disease is present. No significant edema is present. IMPRESSION: 1. Cardiomegaly and mild pulmonary vascular congestion without frank edema. 2. Right pleural effusion and asymmetric right basilar airspace disease. While this may represent atelectasis, there is concern for infection. Electronically Signed   By: CSan MorelleM.D.   On: 04/07/2022 11:25    Microbiology: Results for orders placed or performed during the hospital encounter of 04/07/22  Resp panel by RT-PCR (RSV, Flu A&B, Covid) Anterior Nasal Swab     Status: Abnormal   Collection Time: 04/07/22  1:18 PM   Specimen: Anterior  Nasal Swab  Result Value Ref Range Status   SARS Coronavirus 2 by RT PCR NEGATIVE NEGATIVE Final    Comment: (NOTE) SARS-CoV-2 target nucleic acids are NOT DETECTED.  The SARS-CoV-2 RNA is generally detectable in upper respiratory specimens during the acute phase of infection. The lowest concentration of SARS-CoV-2 viral copies this assay can detect is 138 copies/mL. A negative result does not preclude SARS-Cov-2 infection and should not be used as the sole basis for treatment or other patient management decisions. A negative result may occur with  improper specimen collection/handling, submission of specimen other than nasopharyngeal swab, presence of viral mutation(s) within the areas targeted by this assay, and inadequate number of viral copies(<138 copies/mL). A negative result must be combined with clinical observations, patient history, and epidemiological information. The expected result is Negative.  Fact Sheet for Patients:  EntrepreneurPulse.com.au  Fact Sheet for Healthcare Providers:  IncredibleEmployment.be  This test is no t yet approved or cleared by the [place] FDA and  has been authorized for detection and/or diagnosis of SARS-CoV-2 by FDA under an Emergency Use Authorization (EUA).  This EUA will remain  in effect (meaning this test can be used) for the duration of the COVID-19 declaration under Section 564(b)(1) of the Act, 21 U.S.C.section 360bbb-3(b)(1), unless the authorization is terminated  or revoked sooner.       Influenza A by PCR NEGATIVE NEGATIVE Final   Influenza B by PCR NEGATIVE NEGATIVE Final    Comment: (NOTE) The Xpert Xpress SARS-CoV-2/FLU/RSV plus assay is intended as an aid in the diagnosis of influenza from Nasopharyngeal swab specimens and should not be used as a sole basis for treatment. Nasal washings and aspirates are unacceptable for Xpert Xpress SARS-CoV-2/FLU/RSV testing.  Fact Sheet for Patients: EntrepreneurPulse.com.au  Fact Sheet for Healthcare Providers: IncredibleEmployment.be  This test is not yet approved or cleared by the [place] FDA and has been authorized for detection and/or diagnosis of SARS-CoV-2 by FDA under an Emergency Use Authorization (EUA). This EUA will remain in effect (meaning this test can be used) for the duration of the COVID-19 declaration under Section 564(b)(1) of the Act, 21 U.S.C. section 360bbb-3(b)(1), unless the authorization is terminated or revoked.     Resp Syncytial Virus by PCR POSITIVE (A) NEGATIVE Final    Comment: (NOTE) Fact Sheet for Patients: EntrepreneurPulse.com.au  Fact Sheet for Healthcare Providers: IncredibleEmployment.be  This test is not yet approved or cleared by the [place] FDA and has been authorized for detection and/or diagnosis of SARS-CoV-2 by FDA under an Emergency Use Authorization (EUA). This EUA will remain in effect (meaning this test can be used) for the duration of the COVID-19 declaration under Section 564(b)(1) of the Act, 21 U.S.C. section 360bbb-3(b)(1), unless the authorization is terminated or revoked.  Performed at [facility], [place] [address] [place], [place] [address]   Culture, blood (routine x 2)     Status: None   Collection Time: 04/07/22  1:30 PM   Specimen: BLOOD  Result Value Ref Range Status   Specimen Description   Final    BLOOD LEFT ANTECUBITAL Performed at [facility] [address], [place], [place] [address]    Special Requests   Final    BOTTLES DRAWN AEROBIC AND ANAEROBIC Blood Culture adequate volume Performed at [place] [facility] [address] [place], [place] [address]    Culture   Final    NO GROWTH 5 DAYS Performed at [facility]  Lab, [address], [place], [place] [address]    Report Status 04/12/2022 FINAL  Final  Culture, blood (routine x 2)     Status: None   Collection Time: 04/07/22  1:35 PM   Specimen: BLOOD  Result Value Ref Range Status   Specimen Description   Final    BLOOD RIGHT ANTECUBITAL Performed at [place] [address] [place], [place] [address]    Special Requests   Final    BOTTLES DRAWN AEROBIC AND ANAEROBIC Blood Culture adequate volume Performed at [facility] [address], [place], [place] [address]    Culture   Final    NO GROWTH 5 DAYS Performed at [facility], [place] [address], [place], [place] [address]    Report Status 04/12/2022 FINAL  Final  MRSA Next Gen by PCR, Nasal     Status: None   Collection Time: 04/07/22  7:34 PM   Specimen: Nasal Mucosa; Nasal Swab  Result Value Ref Range Status   MRSA by PCR Next Gen NOT DETECTED NOT DETECTED Final    Comment: (NOTE) The GeneXpert MRSA Assay (FDA approved for NASAL specimens only), is one component of a comprehensive MRSA colonization surveillance program. It is not intended to diagnose MRSA infection nor to guide or monitor treatment for MRSA infections. Test performance is not FDA approved in patients less than 11 years old. Performed at [facility], [place] [address], [place], [place]  [address]   Culture, fungus without smear     Status: Abnormal (Preliminary result)   Collection Time: 04/08/22  3:40 PM   Specimen: Bronchial Washing, Right; Lung  Result Value Ref Range Status   Specimen Description   Final    BRONCHIAL WASHINGS RIGHT LUNG Performed at [facility] [address], [place], [place] [address]    Special Requests   Final    NONE Performed at [facility], [place] [address] [place], [place] [address]    Culture (A)  Final    CANDIDA ALBICANS CONTINUING TO HOLD Performed at [facility], [place] [address], [place], [place] [address]    Report Status PENDING  Incomplete  Acid Fast Smear (AFB)     Status: None   Collection Time: 04/08/22  3:40 PM   Specimen: Bronchial Washing, Right; Lung  Result Value Ref Range Status   AFB Specimen Processing Concentration  Final   Acid Fast Smear Negative  Final    Comment: (NOTE) Performed At: [facility] [place], [place] [id number] Rush Farmer MD [id number]    Source (AFB) BRONCHIAL WASHINGS  Final    Comment: RIGHT LUNG Performed at [facility], [place] [address] [place], [place] [address]   Aerobic/Anaerobic Culture w Gram Stain (surgical/deep wound)     Status: None (Preliminary result)   Collection Time: 04/08/22  3:40 PM   Specimen: Bronchoalveolar Lavage  Result Value Ref Range Status   Specimen Description   Final    BRONCHIAL ALVEOLAR LAVAGE Performed at [facility] [address], [place], [place] [address]    Special Requests   Final    NONE Performed at [facility], [place] [address] [place], [place] [address]    Gram Stain   Final    MODERATE WBC PRESENT,BOTH PMN AND MONONUCLEAR RARE GRAM POSITIVE COCCI Performed at [facility], [place] [address] [place], [place] [address]  Culture   Final    FEW Normal respiratory flora-no Staph aureus or Pseudomonas  seen NO ANAEROBES ISOLATED; CULTURE IN PROGRESS FOR 5 DAYS    Report Status PENDING  Incomplete    Labs: CBC: Recent Labs  Lab 04/07/22 1039 04/08/22 0500 04/09/22 0632 04/10/22 0523  WBC 13.0* 11.1* 11.3* 8.8  HGB 15.2 12.0* 12.6* 11.0*  HCT 46.2 37.6* 40.4 35.8*  MCV 90.1 91.7 92.9 93.7  PLT 321 295 308 950   Basic Metabolic Panel: Recent Labs  Lab 04/07/22 1039 04/07/22 1334 04/08/22 0500 04/09/22 0632 04/10/22 0523 04/11/22 0437 04/12/22 0452  NA 137  --  138 140 141  --   --   K 2.2*  --  2.9* 2.8* 4.0  --   --   CL 84*  --  93* 102 106  --   --   CO2 37*  --  _0 --   --   GLUCOSE 105*  --  83 117* 94  --   --   BUN 14  --  13 7* 8  --   --   CREATININE 0.58*  --  0.56* 0.41* 0.36* 0.40* 0.30*  CALCIUM 9.0  --  8.2* 7.9* 7.6*  --   --   MG  --  2.2  --  1.7 2.1  --  2.1  PHOS  --  3.3  --   --   --   --  2.7   Liver Function Tests: Recent Labs  Lab 04/07/22 1318  AST 28  ALT 17  ALKPHOS 84  BILITOT 1.4*  PROT 8.0  ALBUMIN 2.9*   CBG: Recent Labs  Lab 04/11/22 2005 04/11/22 2339 04/12/22 0357  GLUCAP 170* 143* 121*    Discharge time spent: greater than 30 minutes.  Signed: Oswald Hillock, MD [facility] 04/12/2022

## 2022-04-12 NOTE — Care Management Important Message (Signed)
Important Message  Patient Details IM Letter given. Name: Thomas Raymond MRN: [id number] Date of Birth: [birth year]   Medicare Important Message Given:  Yes     Caren Macadam 04/12/2022, 11:30 AM

## 2022-04-12 NOTE — TOC Initial Note (Addendum)
Transition of Care [facility]) - Initial/Assessment Note    Patient Details  Name: Thomas Raymond MRN: [id number] Date of Birth: 11/05/1945  Transition of [facility] CM/SW Contact:    Adrian Prows, RN Phone Number: 04/12/2022, 2:00 PM  Clinical Narrative:                 [facility] consult for [facility] for tube feeding via PEG; the pt does not have an agency preference; the pt's bedside will provide TF education; spoke w/ Kandee Keen at [facility] and agency can provide [facility] for TF education starting on 04/14/22; Dr Sharl Ma notified and says he is ok with this start date; referral made for TF w/ Jeri Modena at [facility] and she says they can service the pt; spoke w/ Jill Alexanders, [place] and he says 3-day supply of Osmolyte delivered to pt's room; the pt will be transported home by [facility].   -[id number]- [facility] called; spoke [place]; no TOC needs.   Patient Goals and CMS Choice            Expected Discharge Plan and Services         Expected Discharge Date: 04/12/22                                    Prior Living Arrangements/Services                       Activities of Daily Living Home Assistive Devices/Equipment: Wheelchair, Shower chair without back ADL Screening (condition at time of admission) Patient's cognitive ability adequate to safely complete daily activities?: Yes Is the patient deaf or have difficulty hearing?: No Does the patient have difficulty seeing, even when wearing glasses/contacts?: No Does the patient have difficulty concentrating, remembering, or making decisions?: No Patient able to express need for assistance with ADLs?: Yes Does the patient have difficulty dressing or bathing?: No Independently performs ADLs?: Yes (appropriate for developmental age) Does the patient have difficulty walking or climbing stairs?: Yes Weakness of Legs: Both Weakness of Arms/Hands: None  Permission Sought/Granted                  Emotional Assessment              Admission  diagnosis:  Hypokalemia [E87.6] Hypoxia [R09.02] CAP (community acquired pneumonia) [J18.9] Generalized weakness [R53.1] Pneumonia of right middle lobe due to infectious organism [J18.9] Acute cough [R05.1] Patient Active Problem List   Diagnosis Date Noted   Aspiration into lower respiratory tract 04/08/2022   Pneumonia of right middle lobe due to infectious organism 04/07/2022   Cough 01/24/2022   Vitamin D deficiency 01/24/2022   Peripheral edema 09/16/2021   Left sided sciatica 10/13/2020   Choledocholithiasis 11/12/2019   Elevated liver enzymes    Abdominal pain, left lateral 11/11/2019   Conjunctivitis 11/13/2017   Acute sinus infection 05/18/2017   Acute upper respiratory infection 09/19/2016   Acute hypoxemic respiratory failure (HCC)    Atelectasis of both lungs    Acute emphysematous cholecystitis    Hypokalemia    Hypomagnesemia    Dysphagia, pharyngoesophageal phase 02/07/2014   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   Colon cancer screening 01/18/2014   Need for prophylactic vaccination and inoculation against influenza 02/07/2012   Need for prophylactic vaccination against Streptococcus pneumoniae (pneumococcus) 02/07/2012   Respiratory tract infection 04/24/2011   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   Impaired glucose tolerance 02/17/2011  Encounter for well adult exam with abnormal findings 02/17/2011   BPPV (benign paroxysmal positional vertigo) 11/05/2010   VERTIGO 03/05/2010   ALS 01/26/2009   FATIGUE 03/30/2008   ALLERGIC CONJUNCTIVITIS 12/29/2007   THRUSH 09/29/2007   Hyperlipidemia 03/30/2007   METHICILLIN RESISTANT STAPHYLOCOCCUS AUREUS INFECTION 11/22/2006   DISORDER, 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 [place] [address] Phone: [phone] Fax: [phone]     Social Determinants of Health (SDOH) Social History: SDOH Screenings   Food Insecurity: No Food Insecurity (04/09/2022)  Housing: Low Risk  (04/09/2022)  Transportation Needs: No Transportation Needs (04/09/2022)  Utilities: Not At Risk (04/09/2022)  Depression (PHQ2-9): Low Risk  (09/14/2021)  Tobacco Use: Low Risk  (04/10/2022)   SDOH Interventions:     Readmission Risk Interventions     No data to display

## 2022-04-13 LAB — AEROBIC/ANAEROBIC CULTURE W GRAM STAIN (SURGICAL/DEEP WOUND): Culture: NORMAL

## 2022-04-14 DIAGNOSIS — J9601 Acute respiratory failure with hypoxia: Secondary | ICD-10-CM | POA: Diagnosis not present

## 2022-04-14 DIAGNOSIS — G1221 Amyotrophic lateral sclerosis: Secondary | ICD-10-CM | POA: Diagnosis not present

## 2022-04-14 DIAGNOSIS — Z9181 History of falling: Secondary | ICD-10-CM | POA: Diagnosis not present

## 2022-04-14 DIAGNOSIS — J690 Pneumonitis due to inhalation of food and vomit: Secondary | ICD-10-CM | POA: Diagnosis not present

## 2022-04-14 DIAGNOSIS — J439 Emphysema, unspecified: Secondary | ICD-10-CM | POA: Diagnosis not present

## 2022-04-14 DIAGNOSIS — I119 Hypertensive heart disease without heart failure: Secondary | ICD-10-CM | POA: Diagnosis not present

## 2022-04-14 DIAGNOSIS — R1310 Dysphagia, unspecified: Secondary | ICD-10-CM | POA: Diagnosis not present

## 2022-04-14 DIAGNOSIS — E785 Hyperlipidemia, unspecified: Secondary | ICD-10-CM | POA: Diagnosis not present

## 2022-04-14 DIAGNOSIS — I700 Atherosclerosis of aorta: Secondary | ICD-10-CM | POA: Diagnosis not present

## 2022-04-15 ENCOUNTER — Encounter (HOSPITAL_COMMUNITY): Payer: Self-pay | Admitting: Pulmonary Disease

## 2022-04-16 ENCOUNTER — Telehealth: Payer: Self-pay | Admitting: Internal Medicine

## 2022-04-16 ENCOUNTER — Telehealth: Payer: Self-pay | Admitting: *Deleted

## 2022-04-16 ENCOUNTER — Encounter: Payer: Self-pay | Admitting: *Deleted

## 2022-04-16 DIAGNOSIS — G7089 Other specified myoneural disorders: Secondary | ICD-10-CM

## 2022-04-16 DIAGNOSIS — G1221 Amyotrophic lateral sclerosis: Secondary | ICD-10-CM

## 2022-04-16 NOTE — Telephone Encounter (Signed)
Thomas Raymond with [facility] called and needs verbal orders for the patient to get nursing 2 week 1 and 1 week 8. She also wanted to let the doctor know the patient wants a yonker, a suction for the mouth, and said they do make them for the home but she is not sure if the insurance will cover it. She states the patient wants physical therapy but NEEDS speech therapy. She can be contacted at [phone].

## 2022-04-16 NOTE — Patient Outreach (Signed)
  Care Coordination [facility] Note Transition Care Management Unsuccessful Follow-up Telephone Call  Date of discharge and from where:  Friday, 04/12/22 [facility]; aspiration pneumonia, placement of PEG tube; ALS  Attempts:  1st Attempt  Reason for unsuccessful TCM follow-up call:  No answer/busy- attempted calls to patient's number- no answer/ rang without physical or voice mail pick up; attempted second outreach to family member "Sophat" verified on [facility] DPR- received automated outgoing message that that phone number is longer in service; unable to leave voice message on either number, requesting call back  Caryl Pina, RN, BSN, CCRN Alumnus RN CM Care Coordination/ Transition of Care- [facility] Care Management [phone]: direct office

## 2022-04-17 ENCOUNTER — Encounter: Payer: Self-pay | Admitting: *Deleted

## 2022-04-17 ENCOUNTER — Telehealth: Payer: Self-pay | Admitting: *Deleted

## 2022-04-17 ENCOUNTER — Other Ambulatory Visit: Payer: Self-pay | Admitting: Internal Medicine

## 2022-04-17 DIAGNOSIS — G1221 Amyotrophic lateral sclerosis: Secondary | ICD-10-CM

## 2022-04-17 DIAGNOSIS — R479 Unspecified speech disturbances: Secondary | ICD-10-CM

## 2022-04-17 DIAGNOSIS — Z993 Dependence on wheelchair: Secondary | ICD-10-CM

## 2022-04-17 NOTE — Telephone Encounter (Signed)
Spoke with Archie Patten and gave ok for verbals

## 2022-04-17 NOTE — Telephone Encounter (Signed)
Ok for [employer], I will do home PT and Speech orders, and will need to do the hardcopy rx for the yonker when I return to the office tomorrow

## 2022-04-17 NOTE — Patient Outreach (Signed)
  Care Coordination TOC Note Transition Care Management Unsuccessful Follow-up Telephone Call  Date of discharge and from where:  Friday, 04/12/22 [facility]; Acute respiratory failure/ aspiration pneumonia/ ALS; g-tube placement  Attempts:  2nd Attempt  Reason for unsuccessful TCM follow-up call:  No answer/busy  Phone rang multiple times without physical or voice mail pick up- unable to leave voice message requesting call back   Caryl Pina, RN, BSN, CCRN Alumnus RN CM Care Coordination/ Transition of Care- [facility] Care Management [phone]: direct office

## 2022-04-18 ENCOUNTER — Encounter: Payer: Self-pay | Admitting: *Deleted

## 2022-04-18 ENCOUNTER — Telehealth: Payer: Self-pay | Admitting: *Deleted

## 2022-04-18 NOTE — Telephone Encounter (Signed)
DME order faxed to [employer] [facility],confirmation received

## 2022-04-18 NOTE — Patient Outreach (Signed)
  Care Coordination [facility] Note Transition Care Management Unsuccessful Follow-up Telephone Call  Date of discharge and from where:  Friday, 04/12/22 [facility]; acute respiratory failure/ pneumonia/ ALS- placement of G-tube  Attempts:  3rd Attempt  Reason for unsuccessful TCM follow-up call:  No answer/busy  Phone rang multiple times without physical or voice mail pick up; unable to leave voice message requesting call back  Caryl Pina, RN, BSN, CCRN Alumnus RN CM Care Coordination/ Transition of Care- [facility] Care Management [phone]: direct office

## 2022-04-18 NOTE — Telephone Encounter (Signed)
Ok order for [employer] done hardcopy to [employer]

## 2022-04-19 NOTE — Telephone Encounter (Signed)
Thomas Raymond with [employer] has called and stated that the face to face notes are missing from the orders that was sent in and she is needing this for the insurance.

## 2022-04-23 ENCOUNTER — Other Ambulatory Visit: Payer: Self-pay | Admitting: Internal Medicine

## 2022-04-23 ENCOUNTER — Telehealth: Payer: Self-pay | Admitting: Internal Medicine

## 2022-04-23 MED ORDER — HYDROCODONE BIT-HOMATROP MBR 5-1.5 MG/5ML PO SOLN: 5.0000 mL | Freq: Four times a day (QID) | ORAL | 0 refills | Status: DC | PRN

## 2022-04-23 MED ORDER — GABAPENTIN 250 MG/5ML PO SOLN: 300.0000 mg | Freq: Three times a day (TID) | ORAL | 5 refills | Status: DC

## 2022-04-23 MED ORDER — PANTOPRAZOLE SODIUM 40 MG PO PACK: 40.0000 mg | PACK | Freq: Every day | ORAL | 11 refills | Status: DC

## 2022-04-23 NOTE — Telephone Encounter (Signed)
Notified [name] w/ MD response. Also ok verbal for the PT she states to help him strengthen up. [name] states he already have the order for speech therapy.Marland KitchenJohny Raymond

## 2022-04-23 NOTE — Telephone Encounter (Signed)
[name] from [facility] called on behalf of the pt to make some RX requests and changes.   Pt is not taking anything by mouth currently due to hospitalization on 12.17.23 and needs to change his gabapentin (NEURONTIN) 300 MG capsule to a liquid, to switch his omeprazole (PRILOSEC) 20 MG capsule to Pantoprazole that can be mixed with water.   Pt also needs a refill of his HYDROcodone bit-homatropine (HYDROMET) 5-1.5 MG/5ML syrup.  [id number]   Phone: [phone]  Fax: [phone]    Eustaquio Maize is also requesting we put in orders for a social worker evaluation because the pt is currently bedridden and unable to leave the house for any appointments.   Please call [name] to discuss: [phone]

## 2022-04-23 NOTE — Telephone Encounter (Signed)
Ok all 3 meds as requested done erx

## 2022-04-23 NOTE — Telephone Encounter (Signed)
LOV notes faxed to [facility]

## 2022-04-29 LAB — CULTURE, FUNGUS WITHOUT SMEAR

## 2022-04-29 NOTE — Telephone Encounter (Signed)
Thomas Raymond calls back today to note that the RX for pantoprazole sodium (PROTONIX) 40 mg is not covered by [employer]. They are wanting to know now what the options are.

## 2022-04-30 NOTE — Telephone Encounter (Signed)
Please advise as Protonix 40 mg is not covered by insurance

## 2022-04-30 NOTE — Telephone Encounter (Signed)
Unfortunately I dont know what his insurance covers or any way to find this out -   Please ask pt or a person to assist him to check with insurance and let us know which PPI such as nexium, protonix, dexilant or similar medication is covered.

## 2022-05-06 ENCOUNTER — Telehealth: Payer: Self-pay

## 2022-05-06 DIAGNOSIS — R1311 Dysphagia, oral phase: Secondary | ICD-10-CM

## 2022-05-06 MED ORDER — NYSTATIN 100000 UNIT/ML MT SUSP: 500000.0000 [IU] | Freq: Four times a day (QID) | OROMUCOSAL | 1 refills | Status: AC

## 2022-05-06 NOTE — Telephone Encounter (Signed)
Received call from [facility] stating that the patient will need a referral for modified varium  swallowing test at the acute rehab dept at [facility]. Also, since patient is NPO and has thick oral flush, and Rx is needed for nystatin for as long as possible maybe 4x a day for 10 days

## 2022-05-06 NOTE — Telephone Encounter (Signed)
[facility] home health forms given to provider to sign

## 2022-05-06 NOTE — Addendum Note (Signed)
Addended by: Biagio Borg on: 05/06/2022 12:50 PM   Modules accepted: Orders

## 2022-05-06 NOTE — Telephone Encounter (Signed)
Ok the referral and the nystatin rx are done

## 2022-05-07 NOTE — Telephone Encounter (Signed)
[name] calls again regarding this RX issue. She stated that the pharmacy themselves should be able to cross-reference the insurance with the pharmacy itself.   CB: [phone]

## 2022-05-10 ENCOUNTER — Telehealth: Payer: Self-pay | Admitting: Internal Medicine

## 2022-05-10 NOTE — Telephone Encounter (Signed)
For our records:  Dierdre a speech therapist states the pt has been overtaking his metoprolol tartrate (LOPRESSOR).  Dierdre thinks there was a misunderstanding due to the language barrier, but she has explained to the pt and his wife the right dosage and shown them exactly how much to administer at a time.   They are getting a compound refill of the medication from [facility] today.  Please call [facility] for additional info:  [phone]

## 2022-05-10 NOTE — Telephone Encounter (Signed)
Ok this is noted 

## 2022-05-13 ENCOUNTER — Telehealth: Payer: Self-pay | Admitting: Internal Medicine

## 2022-05-13 DIAGNOSIS — G1221 Amyotrophic lateral sclerosis: Secondary | ICD-10-CM

## 2022-05-13 NOTE — Telephone Encounter (Signed)
Physical Therapist Herbert Deaner called stating that he saw the patient for a physical therapy evaluation. He believes the patient would benefit from having physical therapy 2 times a week for 4 weeks. He states the patient needs to focus on transferring. He thinks the patient would also benefit from having a hoyer lift and if an order could be placed for him. Best callback number for Clair Gulling is [phone].

## 2022-05-14 NOTE — Telephone Encounter (Signed)
[facility] for verbal orders  Ok for [employer]  - done hardcopy to [employer]

## 2022-05-14 NOTE — Telephone Encounter (Signed)
Please advise for physical therapy 2 times a week for 4 weeks. He states the patient needs to focus on transferring. Also, PT thinks the patient would also benefit from having a hoyer lift and if an order could be placed for him.

## 2022-05-15 ENCOUNTER — Telehealth: Payer: Self-pay

## 2022-05-15 ENCOUNTER — Telehealth: Payer: Self-pay | Admitting: Internal Medicine

## 2022-05-15 DIAGNOSIS — G1221 Amyotrophic lateral sclerosis: Secondary | ICD-10-CM

## 2022-05-15 DIAGNOSIS — I700 Atherosclerosis of aorta: Secondary | ICD-10-CM | POA: Diagnosis not present

## 2022-05-15 DIAGNOSIS — J439 Emphysema, unspecified: Secondary | ICD-10-CM | POA: Diagnosis not present

## 2022-05-15 DIAGNOSIS — I119 Hypertensive heart disease without heart failure: Secondary | ICD-10-CM | POA: Diagnosis not present

## 2022-05-15 DIAGNOSIS — J690 Pneumonitis due to inhalation of food and vomit: Secondary | ICD-10-CM | POA: Diagnosis not present

## 2022-05-15 DIAGNOSIS — R300 Dysuria: Secondary | ICD-10-CM

## 2022-05-15 DIAGNOSIS — R1310 Dysphagia, unspecified: Secondary | ICD-10-CM | POA: Diagnosis not present

## 2022-05-15 DIAGNOSIS — J9601 Acute respiratory failure with hypoxia: Secondary | ICD-10-CM | POA: Diagnosis not present

## 2022-05-15 DIAGNOSIS — Z9181 History of falling: Secondary | ICD-10-CM | POA: Diagnosis not present

## 2022-05-15 DIAGNOSIS — E785 Hyperlipidemia, unspecified: Secondary | ICD-10-CM | POA: Diagnosis not present

## 2022-05-15 NOTE — Telephone Encounter (Signed)
Deirdre, a speech therapist from [facility] noticed an diagnosis for ALS for this patient but does not know if he sees a specialist for it. She is asking for a referral to an [facility] clinic in [place] that is apart of [facility]. Their number is [phone]

## 2022-05-15 NOTE — Telephone Encounter (Signed)
Ok this is done 

## 2022-05-15 NOTE — Telephone Encounter (Signed)
[name] from [facility] called and stated that the patient needs a refill on his medication HYDROcodone bit-homatropine (HYDROMET) 5-1.5 MG/5ML syrup [id number] [place] [place]. She also stated that the patient was complaining about urinary frequency and burning. Lastly [name] stated that she wanted and update on the replacement medication for the patient's medication  omeprazole (PRILOSEC) 20 MG capsule. She stated that the insurance will not cover this medication. Best callback number for Eustaquio Maize is [phone].

## 2022-05-16 MED ORDER — HYDROCODONE BIT-HOMATROP MBR 5-1.5 MG/5ML PO SOLN: 5.0000 mL | Freq: Four times a day (QID) | ORAL | 0 refills | Status: DC | PRN

## 2022-05-16 NOTE — Telephone Encounter (Signed)
Prilosec has already been changed to protonix per feeding tube  [facility] for cough med refill - done erx  [facility] for urine testing and culture - I have done the lab order but ok for verbal if can done at home

## 2022-05-16 NOTE — Telephone Encounter (Signed)
Left message for Clair Gulling on a secure line regarding verbal orders and DME order for hoyer lift

## 2022-05-17 ENCOUNTER — Telehealth: Payer: Self-pay | Admitting: Internal Medicine

## 2022-05-17 MED ORDER — OMEPRAZOLE 20 MG PO CPDR: 20.0000 mg | DELAYED_RELEASE_CAPSULE | Freq: Every day | ORAL | 3 refills | Status: AC

## 2022-05-17 MED ORDER — GABAPENTIN 250 MG/5ML PO SOLN: 300.0000 mg | Freq: Three times a day (TID) | ORAL | 5 refills | Status: DC

## 2022-05-17 NOTE — Telephone Encounter (Signed)
Thomas Raymond with Thomas Raymond called requesting to speak with manager regarding patient. States that patient and his wife do not speak English and we need to be able to communicate with patient's daughter who is out of state in the [place]. Patient is bed bound and unable to come into office to compete a DPR to grant permission for [place] to be able to speak with the daughter. I have emailed a DPR to Thomas at [email or web address] so that she can take to patient's home to be filled out. She will get this filled out and returned to the office so that we can communicate with the daughter, who speaks English and can coordinate patient's care.

## 2022-05-17 NOTE — Telephone Encounter (Signed)
Ok this is done 

## 2022-05-17 NOTE — Telephone Encounter (Signed)
Thomas Raymond gave her MD response. She states the protonix is not covered by pt insurance. Talked to pharmacist abt rx from [facility]. He can put the omeprazole in some juice and someone will pour it in his tube.. then flush again with juice he can take like that. They have been doing that. Would like rx for omeprazole 20mg  [facility]

## 2022-05-17 NOTE — Telephone Encounter (Signed)
Ok this changed back to the omeprazole

## 2022-05-17 NOTE — Telephone Encounter (Signed)
A nurse with Thomas Raymond called stated patient needs his gabapentin solution filled ASAP.

## 2022-05-20 ENCOUNTER — Other Ambulatory Visit: Payer: Self-pay | Admitting: Internal Medicine

## 2022-05-20 DIAGNOSIS — R399 Unspecified symptoms and signs involving the genitourinary system: Secondary | ICD-10-CM | POA: Diagnosis not present

## 2022-05-20 DIAGNOSIS — N390 Urinary tract infection, site not specified: Secondary | ICD-10-CM | POA: Diagnosis not present

## 2022-05-23 ENCOUNTER — Telehealth: Payer: Self-pay | Admitting: Internal Medicine

## 2022-05-23 LAB — ACID FAST CULTURE WITH REFLEXED SENSITIVITIES (MYCOBACTERIA): Acid Fast Culture: NEGATIVE

## 2022-05-23 NOTE — Telephone Encounter (Signed)
Thomas Raymond with [facility] calls today in regards to PT's lab culture results. Thomas Raymond states that results showed bacteria present in urine culture but that this could possibly be contamination.   I informed Thomas Raymond we had gotten in the results via fax but was not sure if they had been viewed quite yet.  CB if needed: [phone]

## 2022-05-24 MED ORDER — CIPROFLOXACIN HCL 500 MG PO TABS: 500.0000 mg | ORAL_TABLET | Freq: Two times a day (BID) | ORAL | 0 refills | Status: AC

## 2022-05-24 NOTE — Telephone Encounter (Signed)
Are you able to view this report in media?

## 2022-05-24 NOTE — Telephone Encounter (Signed)
Ok for cipro course- I will send

## 2022-05-27 ENCOUNTER — Telehealth: Payer: Self-pay | Admitting: Internal Medicine

## 2022-05-27 NOTE — Telephone Encounter (Signed)
Ok for mucinex up to 1200 mg bid,   I am not able to send compound omprazole as I have not heard of this before and not able on EPIC

## 2022-05-27 NOTE — Telephone Encounter (Signed)
[name] from [place] called after seeing the pt today   Pt had slight bronchi in R upper lobe, which cleared with coughing. Had scattered wheezing in both lower lobes. Heart rate low: 52.   Pt was given an RX for Hydromet cough syrup, which is on back order and the pt wants to know if he can take over the counter mucinex through his G tube.   Pt's wife states the Omeprazole gets clumped up in G tube, could we send an Rx for a compound omeprazole ([place]) RX.  Please call pt and advise on the OTC medications:  [phone]   For any questions please call [name]: [phone]

## 2022-05-28 NOTE — Telephone Encounter (Signed)
I think so, but please ask the caller to verify this with the pharmacist

## 2022-05-28 NOTE — Telephone Encounter (Signed)
Informed [name] of the cipro, she is asking if this can be crushed up since patient is NPO

## 2022-05-28 NOTE — Telephone Encounter (Signed)
[name] from [facility] informed

## 2022-05-28 NOTE — Telephone Encounter (Signed)
Confirmed with pharmacy that cipro may be crushed for patient to take through gtube. [name] at [facility] informed

## 2022-06-04 ENCOUNTER — Telehealth: Payer: Self-pay | Admitting: Internal Medicine

## 2022-06-04 DIAGNOSIS — G1221 Amyotrophic lateral sclerosis: Secondary | ICD-10-CM

## 2022-06-04 NOTE — Telephone Encounter (Signed)
Clair Gulling from [place] called to for a DME request with the preferred provider.   Pt has a  [employer] fast streamer, pt needs a sling that will allow access to bedside commode, sling needs to have a hole in middle of it.  Clair Gulling is also requesting 1 more visit next week.  Please call Clair Gulling to confirm:  [phone]

## 2022-06-04 NOTE — Telephone Encounter (Signed)
Order needed for a sling that will allow access to bedside commode, sling needs to have a hole in middle of it. This will be used for the hoyer lift.   Clair Gulling is also requesting 1 more visit next week

## 2022-06-04 NOTE — Telephone Encounter (Signed)
[place] for verbal order  DME order done hardcopy to [facility]

## 2022-06-05 ENCOUNTER — Other Ambulatory Visit: Payer: Self-pay | Admitting: Internal Medicine

## 2022-06-05 ENCOUNTER — Telehealth: Payer: Self-pay | Admitting: Internal Medicine

## 2022-06-05 NOTE — Telephone Encounter (Signed)
For our records:  [name] from [facility] states the Pt had new rhonchi in L lobe.  [name]: [phone]

## 2022-06-05 NOTE — Telephone Encounter (Signed)
Orders faxed, LOV notes attached

## 2022-06-06 NOTE — Telephone Encounter (Signed)
Needs ROV if possible, or to ED if has fever, worsening prod cough, chest pain, sob or other unusual symptoms

## 2022-06-07 ENCOUNTER — Telehealth (HOSPITAL_COMMUNITY): Payer: Self-pay

## 2022-06-07 ENCOUNTER — Other Ambulatory Visit (HOSPITAL_COMMUNITY): Payer: Self-pay

## 2022-06-07 ENCOUNTER — Other Ambulatory Visit: Payer: Self-pay | Admitting: Internal Medicine

## 2022-06-07 ENCOUNTER — Telehealth: Payer: Self-pay | Admitting: Internal Medicine

## 2022-06-07 DIAGNOSIS — R1310 Dysphagia, unspecified: Secondary | ICD-10-CM

## 2022-06-07 DIAGNOSIS — G1221 Amyotrophic lateral sclerosis: Secondary | ICD-10-CM

## 2022-06-07 NOTE — Telephone Encounter (Signed)
Unable to contact patients daughter or leave message as phone just keeps ringing. Will continue to reach out regarding advise given by provider

## 2022-06-07 NOTE — Telephone Encounter (Signed)
HH called back to verify that the correct fax number for the ALS clinic is [phone]. Message has been sent to referral team.

## 2022-06-07 NOTE — Telephone Encounter (Signed)
See below

## 2022-06-07 NOTE — Telephone Encounter (Signed)
Patient's referral for speech therapy needs to be sent through again - it shows that it was cancelled because he is receiving home health - but he needs a barium swallow test.

## 2022-06-07 NOTE — Telephone Encounter (Signed)
Received a call from Elio Forget, SLP from [place] regarding a Modified Barium Swallow order for patient. I explained to Deirdre that we do not have an order on this patient. She proceeded to state that she has contacted Dr.James multiple times to place order. Deirdre has requested that I reach to MD to check on status of order.   Sent secure chat to Dr.James. Awaiting a response.

## 2022-06-07 NOTE — Telephone Encounter (Signed)
[facility] nurse called asking if the referral to the [place] clinic in [facility] could be resent as patient is able to get to appointments now.

## 2022-06-07 NOTE — Telephone Encounter (Signed)
Fax # [phone]

## 2022-06-07 NOTE — Telephone Encounter (Signed)
Ok referral done 

## 2022-06-07 NOTE — Telephone Encounter (Signed)
Ok referral has been ordered

## 2022-06-11 ENCOUNTER — Ambulatory Visit (HOSPITAL_COMMUNITY)
Admission: RE | Admit: 2022-06-11 | Discharge: 2022-06-11 | Disposition: A | Discharge status: Home / Self Care | Payer: Medicare HMO | Source: Ambulatory Visit | Attending: Internal Medicine | Admitting: Internal Medicine

## 2022-06-11 ENCOUNTER — Telehealth: Payer: Self-pay | Admitting: Internal Medicine

## 2022-06-11 ENCOUNTER — Ambulatory Visit (INDEPENDENT_AMBULATORY_CARE_PROVIDER_SITE_OTHER): Payer: Medicare HMO | Admitting: Internal Medicine

## 2022-06-11 VITALS — BP 122/76 | HR 61 | Temp 98.0°F

## 2022-06-11 DIAGNOSIS — E559 Vitamin D deficiency, unspecified: Secondary | ICD-10-CM

## 2022-06-11 DIAGNOSIS — N320 Bladder-neck obstruction: Secondary | ICD-10-CM

## 2022-06-11 DIAGNOSIS — E7800 Pure hypercholesterolemia, unspecified: Secondary | ICD-10-CM | POA: Diagnosis not present

## 2022-06-11 DIAGNOSIS — I10 Essential (primary) hypertension: Secondary | ICD-10-CM

## 2022-06-11 DIAGNOSIS — F32A Depression, unspecified: Secondary | ICD-10-CM | POA: Diagnosis not present

## 2022-06-11 DIAGNOSIS — E538 Deficiency of other specified B group vitamins: Secondary | ICD-10-CM

## 2022-06-11 DIAGNOSIS — R1319 Other dysphagia: Secondary | ICD-10-CM

## 2022-06-11 DIAGNOSIS — G1221 Amyotrophic lateral sclerosis: Secondary | ICD-10-CM

## 2022-06-11 DIAGNOSIS — Z9049 Acquired absence of other specified parts of digestive tract: Secondary | ICD-10-CM | POA: Diagnosis not present

## 2022-06-11 DIAGNOSIS — R1310 Dysphagia, unspecified: Secondary | ICD-10-CM

## 2022-06-11 DIAGNOSIS — Z0001 Encounter for general adult medical examination with abnormal findings: Secondary | ICD-10-CM

## 2022-06-11 DIAGNOSIS — R6889 Other general symptoms and signs: Secondary | ICD-10-CM | POA: Diagnosis not present

## 2022-06-11 DIAGNOSIS — R1314 Dysphagia, pharyngoesophageal phase: Secondary | ICD-10-CM | POA: Insufficient documentation

## 2022-06-11 DIAGNOSIS — R7302 Impaired glucose tolerance (oral): Secondary | ICD-10-CM | POA: Diagnosis not present

## 2022-06-11 DIAGNOSIS — Z7409 Other reduced mobility: Secondary | ICD-10-CM

## 2022-06-11 LAB — CBC WITH DIFFERENTIAL/PLATELET
Basophils Absolute: 0.1 10*3/uL (ref 0.0–0.1)
Basophils Relative: 0.9 % (ref 0.0–3.0)
Eosinophils Absolute: 0.3 10*3/uL (ref 0.0–0.7)
Eosinophils Relative: 4.8 % (ref 0.0–5.0)
HCT: 40.7 % (ref 39.0–52.0)
Hemoglobin: 13.2 g/dL (ref 13.0–17.0)
Lymphocytes Relative: 20.4 % (ref 12.0–46.0)
Lymphs Abs: 1.3 10*3/uL (ref 0.7–4.0)
MCHC: 32.5 g/dL (ref 30.0–36.0)
MCV: 88.7 fl (ref 78.0–100.0)
Monocytes Absolute: 0.8 10*3/uL (ref 0.1–1.0)
Monocytes Relative: 12.7 % — ABNORMAL HIGH (ref 3.0–12.0)
Neutro Abs: 4 10*3/uL (ref 1.4–7.7)
Neutrophils Relative %: 61.2 % (ref 43.0–77.0)
Platelets: 305 10*3/uL (ref 150.0–400.0)
RBC: 4.59 Mil/uL (ref 4.22–5.81)
RDW: 15.1 % (ref 11.5–15.5)
WBC: 6.5 10*3/uL (ref 4.0–10.5)

## 2022-06-11 LAB — BASIC METABOLIC PANEL
BUN: 24 mg/dL — ABNORMAL HIGH (ref 6–23)
CO2: 28 mEq/L (ref 19–32)
Calcium: 9.2 mg/dL (ref 8.4–10.5)
Chloride: 100 mEq/L (ref 96–112)
Creatinine, Ser: 0.25 mg/dL — ABNORMAL LOW (ref 0.40–1.50)
GFR: 121.19 mL/min (ref >60.00)
Glucose, Bld: 110 mg/dL — ABNORMAL HIGH (ref 70–99)
Potassium: 4.9 mEq/L (ref 3.5–5.1)
Sodium: 136 mEq/L (ref 135–145)

## 2022-06-11 LAB — URINALYSIS, ROUTINE W REFLEX MICROSCOPIC
Bilirubin Urine: NEGATIVE
Hgb urine dipstick: NEGATIVE
Ketones, ur: NEGATIVE
Leukocytes,Ua: NEGATIVE
Nitrite: NEGATIVE
RBC / HPF: NONE SEEN (ref 0–?)
Specific Gravity, Urine: 1.025 (ref 1.000–1.030)
Total Protein, Urine: NEGATIVE
Urine Glucose: NEGATIVE
Urobilinogen, UA: 0.2 (ref 0.0–1.0)
pH: 7.5 (ref 5.0–8.0)

## 2022-06-11 LAB — HEMOGLOBIN A1C: Hgb A1c MFr Bld: 5.9 % (ref 4.6–6.5)

## 2022-06-11 LAB — LIPID PANEL
Cholesterol: 106 mg/dL (ref 0–200)
HDL: 39.1 mg/dL (ref >39.00)
LDL Cholesterol: 42 mg/dL (ref 0–99)
NonHDL: 67.15
Total CHOL/HDL Ratio: 3
Triglycerides: 126 mg/dL (ref 0.0–149.0)
VLDL: 25.2 mg/dL (ref 0.0–40.0)

## 2022-06-11 LAB — HEPATIC FUNCTION PANEL
ALT: 84 U/L — ABNORMAL HIGH (ref 0–53)
AST: 59 U/L — ABNORMAL HIGH (ref 0–37)
Albumin: 3.5 g/dL (ref 3.5–5.2)
Alkaline Phosphatase: 86 U/L (ref 39–117)
Bilirubin, Direct: 0.1 mg/dL (ref 0.0–0.3)
Total Bilirubin: 0.4 mg/dL (ref 0.2–1.2)
Total Protein: 7.7 g/dL (ref 6.0–8.3)

## 2022-06-11 LAB — TSH: TSH: 1.69 u[IU]/mL (ref 0.35–5.50)

## 2022-06-11 LAB — VITAMIN B12: Vitamin B-12: 818 pg/mL (ref 211–911)

## 2022-06-11 LAB — PSA: PSA: 0.33 ng/mL (ref 0.10–4.00)

## 2022-06-11 LAB — VITAMIN D 25 HYDROXY (VIT D DEFICIENCY, FRACTURES): VITD: 28.02 ng/mL — ABNORMAL LOW (ref 30.00–100.00)

## 2022-06-11 NOTE — Telephone Encounter (Signed)
Ok this is done 

## 2022-06-11 NOTE — Progress Notes (Addendum)
Patient ID: Nahzir Schult, male   DOB: December 27, 1945, 77 y.o.   MRN: [id number]         Chief Complaint:: wellness exam and Hospital follow up (Order for new hospital bed and for motorized wheelchair), Home health (Referral for shower chair and bedside commode and request for assistance getting in and out of home), and referral for ALS  , dysphagia, depression, ALS       HPI:  13 Metcalfe is a 77 y.o. male here for wellness exam; declines flu shot and covid booster, o/w up to date                        Also now s/p hospn with d/c dec 22 after episode RML aspirate pna.  Mod Barium swallow has been ordered by me very recent due to noting of staff of persistent dysphagia at home.  Has had mild worsening depressive symptoms, but no suicidal ideation, or panic; declines tx today.  Has not had neuro f/u in last several years.  Pt denies chest pain, increased sob or doe, wheezing, orthopnea, PND, increased LE swelling, palpitations, dizziness or syncope.   Pt denies polydipsia, polyuria, or new focal neuro s/s.    Pt denies fever,night sweats, loss of appetite, or other constitutional symptoms, though has lost wt over the past several years.    Also needs smei-electric hospital bed:  pt require re-positioning of the body in ways that cannot be achieved with an ordinary bed or wedge pillow, to eliminate pain, reduce pressure and sore prevention.  Pt has ALS and incapable of turning his body with his too weak arms, and has no lower extremity strength at all.     Wt Readings from Last 3 Encounters:  04/12/22 138 lb 10.7 oz (62.9 kg)  11/15/19 140 lb 3.4 oz (63.6 kg)  09/19/16 151 lb (68.5 kg)   BP Readings from Last 3 Encounters:  06/11/22 122/76  04/12/22 131/67  01/24/22 128/72   Immunization History  Administered Date(s) Administered   Influenza Split 01/21/2011, 02/07/2012   Influenza Whole 01/30/2006, 02/05/2008, 01/26/2009   Influenza, High Dose Seasonal PF 05/30/2016, 05/16/2017, 04/28/2018    Influenza,inj,Quad PF,6+ Mos 02/23/2013, 05/19/2014   Moderna SARS-COV2 Booster Vaccination 01/24/2020   Moderna Sars-Covid-2 Vaccination 07/04/2019, 08/01/2019   Pneumococcal Conjugate-13 03/09/2013   Pneumococcal Polysaccharide-23 01/30/2006, 02/07/2012   Td 03/30/2008   Tdap 10/05/2012   Health Maintenance Due  Topic Date Due   INFLUENZA VACCINE  11/20/2021   COVID-19 Vaccine (4 - 2023-24 season) 12/21/2021      Past Medical History:  Diagnosis Date   ALLERGIC RHINITIS    ALS    HYPERLIPIDEMIA    HYPERTENSION    Impaired glucose tolerance 02/17/2011   LOW BACK PAIN    VERTIGO    Past Surgical History:  Procedure Laterality Date   BRONCHIAL WASHINGS  04/08/2022   Procedure: BRONCHIAL WASHINGS;  Surgeon: Rigoberto Noel, MD;  Location: [facility];  Service: Cardiopulmonary;;   CHOLECYSTECTOMY N/A 11/15/2019   Procedure: LAPAROSCOPIC CHOLECYSTECTOMY;  Surgeon: Coralie Keens, MD;  Location: [facility];  Service: General;  Laterality: N/A;   COMPRESSION HIP SCREW Left 02/07/2014   Procedure: IM Nail;  Surgeon: Alta Corning, MD;  Location: [facility];  Service: Orthopedics;  Laterality: Left;   ERCP N/A 11/14/2019   Procedure: ENDOSCOPIC RETROGRADE CHOLANGIOPANCREATOGRAPHY (ERCP);  Surgeon: Irene Shipper, MD;  Location: [facility] ENDOSCOPY;  Service: Endoscopy;  Laterality: N/A;   IR GASTROSTOMY TUBE  MOD SED  04/10/2022   IR GENERIC HISTORICAL  11/21/2015   IR RADIOLOGIST EVAL & MGMT 11/21/2015 Arne Cleveland, MD [facility] INTER[facility] RAD   Lipoma removal  2003   Percutaneous gastrostomy tube repalcement     04/1999 and removed 07/1999   REMOVAL OF STONES  11/14/2019   Procedure: REMOVAL OF STONES;  Surgeon: Irene Shipper, MD;  Location: [facility] ENDOSCOPY;  Service: Endoscopy;;   SPHINCTEROTOMY  11/14/2019   Procedure: Joan Mayans;  Surgeon: Irene Shipper, MD;  Location: [facility] ENDOSCOPY;  Service: Endoscopy;;   TRACHEOSTOMY     VIDEO BRONCHOSCOPY Right 04/08/2022   Procedure: VIDEO BRONCHOSCOPY WITHOUT  FLUORO;  Surgeon: Rigoberto Noel, MD;  Location: [facility];  Service: Cardiopulmonary;  Laterality: Right;    reports that he has never smoked. He has never used smokeless tobacco. He reports that he does not drink alcohol and does not use drugs. family history includes Other in his father and mother; Other (age of onset: 2) in his daughter and son. No Known Allergies Current Outpatient Medications on File Prior to Visit  Medication Sig Dispense Refill   amLODipine (NORVASC) 10 MG tablet Take 10 mg by mouth daily.     cetirizine (ZYRTEC) 10 MG tablet Take 1 tablet (10 mg total) by mouth daily as needed. Take via tube 90 tablet 3   gabapentin (NEURONTIN) 250 MG/5ML solution Place 6 mLs (300 mg total) into feeding tube 3 (three) times daily. 470 mL 5   hydrochlorothiazide (MICROZIDE) 12.5 MG capsule Take 12.5 mg by mouth daily.     metoprolol succinate (TOPROL-XL) 50 MG 24 hr tablet Take 50 mg by mouth daily.     metoprolol tartrate (LOPRESSOR) 25 mg/10 mL SUSP Place 10 mLs (25 mg total) into feeding tube 2 (two) times daily. 600 mL 2   nystatin (MYCOSTATIN) 100000 UNIT/ML suspension Take 5 mLs by mouth 4 (four) times daily.     omeprazole (PRILOSEC) 20 MG capsule Take 1 capsule (20 mg total) by mouth daily. 90 capsule 3   rosuvastatin (CRESTOR) 20 MG tablet Place 1 tablet (20 mg total) into feeding tube daily.     acetaminophen (TYLENOL) 325 MG tablet Take 2 tablets (650 mg total) by mouth every 6 (six) hours as needed for mild pain or moderate pain (or temp > 100). Take via tube 20 tablet 0   HYDROcodone bit-homatropine (HYDROMET) 5-1.5 MG/5ML syrup Take 5 mLs by mouth every 6 (six) hours as needed for cough. Take via tube 180 mL 0   loperamide HCl (IMODIUM) 1 MG/7.5ML suspension Place 15 mLs (2 mg total) into feeding tube every 6 (six) hours as needed for diarrhea or loose stools. 200 mL 0   triamcinolone (NASACORT) 55 MCG/ACT AERO nasal inhaler Place 2 sprays into the nose daily. (Patient not  taking: Reported on 04/09/2022) 1 each 12   No current facility-administered medications on file prior to visit.        ROS:  All others reviewed and negative.  Objective        PE:  BP 122/76 (BP Location: Left Arm, Patient Position: Sitting, Cuff Size: Large)   Pulse 61   Temp 98 F (36.7 C) (Oral)   SpO2 99%                 Constitutional: Pt appears in NAD               HENT: Head: NCAT.  Right Ear: External ear normal.                 Left Ear: External ear normal.                Eyes: . Pupils are equal, round, and reactive to light. Conjunctivae and EOM are normal               Nose: without d/c or deformity               Neck: Neck supple. Gross normal ROM               Cardiovascular: Normal rate and regular rhythm.                 Pulmonary/Chest: Effort normal and breath sounds without rales or wheezing.                Abd:  Soft, NT, ND, + BS, no organomegaly               Neurological: Pt is alert. At baseline orientation, wheel chair bound with ALS               Skin: Skin is warm. No rashes, no other new lesions, LE edema - trace pedal chronic               Psychiatric: Pt behavior is normal without agitation   Micro: none  Cardiac tracings I have personally interpreted today:  none  Pertinent Radiological findings (summarize): none   Lab Results  Component Value Date   WBC 6.5 06/11/2022   HGB 13.2 06/11/2022   HCT 40.7 06/11/2022   PLT 305.0 06/11/2022   GLUCOSE 110 (H) 06/11/2022   CHOL 106 06/11/2022   TRIG 126.0 06/11/2022   HDL 39.10 06/11/2022   LDLDIRECT 126.4 09/22/2006   LDLCALC 42 06/11/2022   ALT 84 (H) 06/11/2022   AST 59 (H) 06/11/2022   NA 136 06/11/2022   K 4.9 06/11/2022   CL 100 06/11/2022   CREATININE 0.25 (L) 06/11/2022   BUN 24 (H) 06/11/2022   CO2 28 06/11/2022   TSH 1.69 06/11/2022   PSA 0.33 06/11/2022   INR 1.0 04/10/2022   HGBA1C 5.9 06/11/2022   Assessment/Plan:  Benjamen Napieralski is a 77 y.o. Asian [4] male  with  has a past medical history of ALLERGIC RHINITIS, ALS, HYPERLIPIDEMIA, HYPERTENSION, Impaired glucose tolerance (02/17/2011), LOW BACK PAIN, and VERTIGO.  Encounter for well adult exam with abnormal findings Age and sex appropriate education and counseling updated with regular exercise and diet Referrals for preventative services - none needed Immunizations addressed - declines flu shot and covid booster Smoking counseling  - none needed Evidence for depression or other mood disorder - none significant Most recent labs reviewed. I have personally reviewed and have noted: 1) the patient's medical and social history 2) The patient's current medications and supplements 3) The patient's height, weight, and BMI have been recorded in the chart   ALS Has not had neuro f/u in several yrs - for neurology referral [place] [place]; also for [facility] with RN, hosp bed with tray table, BSC and shower chair; also in need for new Power Wheelchair  - will need PT for mobility exam  Essential hypertension BP Readings from Last 3 Encounters:  06/11/22 122/76  04/12/22 131/67  01/24/22 128/72   Stable, pt to continue medical treatment norvasc 10 mg qd, hct 12.5 mg qd, toprol xl 50 mg qd  Hyperlipidemia Lab Results  Component Value Date   LDLCALC 42 06/11/2022   Stable, pt to continue current statin crestor 20 mg qd   Impaired glucose tolerance Lab Results  Component Value Date   HGBA1C 5.9 06/11/2022   Stable, pt to continue current medical treatment  - diet, wt control   Bladder neck obstruction Asympt, also for psa as is due for yearly screening  Vitamin D deficiency Last vitamin D Lab Results  Component Value Date   VD25OH 28.02 (L) 06/11/2022   Low, to start oral replacement   Other dysphagia For f/u barium swallow later today  Followup: Return in about 6 months (around 12/10/2022).  Cathlean Cower, MD 06/15/2022 8:48 PM [facility]

## 2022-06-11 NOTE — Telephone Encounter (Signed)
Please send referral to [phone] at neurology for ALS

## 2022-06-11 NOTE — Patient Instructions (Signed)
Please continue all other medications as before, and refills have been done if requested.  Please have the pharmacy call with any other refills you may need.  Please continue your efforts at being more active, low cholesterol diet, and weight control.  You are otherwise up to date with prevention measures today.  Please keep your appointments with your specialists as you may have planned  The forms for the Power Wheelchair, [facility], Shower chair and hospital bed will be faxed to [facility] will be contacted regarding the referral for: [facility] for a one time Physical Therapy eval for the Power Wheelchair  You will be contacted regarding the referral for: Neurology at [facility]  Please go to the LAB at the blood drawing area for the tests to be done  You will be contacted by phone if any changes need to be made immediately.  Otherwise, you will receive a letter about your results with an explanation, but please check with MyChart first.  Please remember to sign up for MyChart if you have not done so, as this will be important to you in the future with finding out test results, communicating by private email, and scheduling acute appointments online when needed.  Please make an Appointment to return in 6 months, or sooner if needed

## 2022-06-11 NOTE — Telephone Encounter (Signed)
Notified PT MD has placed the referral../lmb

## 2022-06-11 NOTE — Telephone Encounter (Signed)
PT called and said patient spoke with PCP 06/11/22 about a new power wheelchair. They would like a referral to mobility specialist. PT recommended [facility]  Herbert Deaner PT from [place]

## 2022-06-14 ENCOUNTER — Other Ambulatory Visit: Payer: Self-pay

## 2022-06-14 ENCOUNTER — Telehealth: Payer: Self-pay

## 2022-06-14 MED ORDER — FIRST - METOPROLOL 10 MG/ML PO SOLN: ORAL | 0 refills | Status: DC

## 2022-06-14 NOTE — Telephone Encounter (Signed)
Spoke with Clair Gulling at [facility] who states that the wheelchair order was received previously by [facility], evaluation is Tuesday. All other orders have been faxed to [facility] for the second time

## 2022-06-15 ENCOUNTER — Encounter: Payer: Self-pay | Admitting: Internal Medicine

## 2022-06-15 DIAGNOSIS — G1221 Amyotrophic lateral sclerosis: Secondary | ICD-10-CM | POA: Insufficient documentation

## 2022-06-15 DIAGNOSIS — F32A Depression, unspecified: Secondary | ICD-10-CM | POA: Insufficient documentation

## 2022-06-15 NOTE — Assessment & Plan Note (Signed)
BP Readings from Last 3 Encounters:  06/11/22 122/76  04/12/22 131/67  01/24/22 128/72   Stable, pt to continue medical treatment norvasc 10 mg qd, hct 12.5 mg qd, toprol xl 50 mg qd

## 2022-06-15 NOTE — Assessment & Plan Note (Signed)
Mild per pt, decliens tx start such as SSRI or counsling

## 2022-06-15 NOTE — Assessment & Plan Note (Signed)
Age and sex appropriate education and counseling updated with regular exercise and diet Referrals for preventative services - none needed Immunizations addressed - declines flu shot and covid booster Smoking counseling  - none needed Evidence for depression or other mood disorder - none significant Most recent labs reviewed. I have personally reviewed and have noted: 1) the patient's medical and social history 2) The patient's current medications and supplements 3) The patient's height, weight, and BMI have been recorded in the chart

## 2022-06-15 NOTE — Assessment & Plan Note (Addendum)
Has not had neuro f/u in several yrs - for neurology referral [facility]; also for [facility] with RN, hosp bed with tray table, BSC and shower chair; also in need for new Power Wheelchair  - will need PT for mobility exam

## 2022-06-15 NOTE — Assessment & Plan Note (Signed)
Asympt, also for psa as is due for yearly screening

## 2022-06-15 NOTE — Assessment & Plan Note (Signed)
Lab Results  Component Value Date   LDLCALC 42 06/11/2022   Stable, pt to continue current statin crestor 20 mg qd

## 2022-06-15 NOTE — Assessment & Plan Note (Signed)
For f/u barium swallow later today

## 2022-06-15 NOTE — Assessment & Plan Note (Signed)
Lab Results  Component Value Date   HGBA1C 5.9 06/11/2022   Stable, pt to continue current medical treatment  - diet, wt control

## 2022-06-15 NOTE — Assessment & Plan Note (Signed)
Last vitamin D Lab Results  Component Value Date   VD25OH 28.02 (L) 06/11/2022   Low, to start oral replacement

## 2022-06-18 ENCOUNTER — Telehealth: Payer: Self-pay | Admitting: Internal Medicine

## 2022-06-18 NOTE — Telephone Encounter (Signed)
Arbie Cookey with [facility] [employer] calls today in regards to PT's order for a power chair. They are currently assessing PT in his home and are requiring the last two set of office notes be faxed to them before approving the order. These can be faxed to  [place]: [place]: [phone]

## 2022-06-18 NOTE — Telephone Encounter (Signed)
Last two OV notes faxed to [facility]

## 2022-06-20 NOTE — Telephone Encounter (Signed)
[facility] said that they still need the clinical notes faxed to them - fax #  [phone]

## 2022-06-21 NOTE — Telephone Encounter (Signed)
Notes not needed as they were faxed on 2/27. Spoke with [facility] who will fax forms needed for insurance.

## 2022-06-27 NOTE — Telephone Encounter (Signed)
Form for wheelchair received, completion in progress

## 2022-06-28 ENCOUNTER — Telehealth: Payer: Self-pay | Admitting: Internal Medicine

## 2022-06-28 NOTE — Telephone Encounter (Signed)
OT 1 week for 2 week for functional mobility [employer] promotion and exercise  [employer] with [facility] [phone]

## 2022-06-28 NOTE — Telephone Encounter (Signed)
Ok for verbals 

## 2022-06-28 NOTE — Telephone Encounter (Signed)
Please advise for verbals

## 2022-06-28 NOTE — Telephone Encounter (Signed)
Don at [facility] given ok for verbal orders

## 2022-07-18 DIAGNOSIS — G1221 Amyotrophic lateral sclerosis: Secondary | ICD-10-CM | POA: Diagnosis not present

## 2022-07-18 DIAGNOSIS — I10 Essential (primary) hypertension: Secondary | ICD-10-CM | POA: Diagnosis not present

## 2022-07-25 DIAGNOSIS — R6889 Other general symptoms and signs: Secondary | ICD-10-CM | POA: Diagnosis not present

## 2022-07-25 DIAGNOSIS — G1220 Motor neuron disease, unspecified: Secondary | ICD-10-CM | POA: Diagnosis not present

## 2022-08-06 ENCOUNTER — Ambulatory Visit (INDEPENDENT_AMBULATORY_CARE_PROVIDER_SITE_OTHER): Payer: Medicare HMO | Admitting: Internal Medicine

## 2022-08-06 ENCOUNTER — Encounter: Payer: Self-pay | Admitting: Internal Medicine

## 2022-08-06 VITALS — BP 124/80 | HR 60 | Temp 98.2°F | Ht 61.0 in

## 2022-08-06 DIAGNOSIS — B370 Candidal stomatitis: Secondary | ICD-10-CM

## 2022-08-06 DIAGNOSIS — R6889 Other general symptoms and signs: Secondary | ICD-10-CM | POA: Diagnosis not present

## 2022-08-06 DIAGNOSIS — J309 Allergic rhinitis, unspecified: Secondary | ICD-10-CM | POA: Diagnosis not present

## 2022-08-06 DIAGNOSIS — F32A Depression, unspecified: Secondary | ICD-10-CM | POA: Diagnosis not present

## 2022-08-06 DIAGNOSIS — R059 Cough, unspecified: Secondary | ICD-10-CM

## 2022-08-06 MED ORDER — METHYLPREDNISOLONE ACETATE 80 MG/ML IJ SUSP
80.0000 mg | Freq: Once | INTRAMUSCULAR | Status: AC
  Administered 2022-08-06: 80 mg via INTRAMUSCULAR

## 2022-08-06 MED ORDER — HYDROCODONE BIT-HOMATROP MBR 5-1.5 MG/5ML PO SOLN: 5.0000 mL | Freq: Four times a day (QID) | ORAL | 0 refills | Status: DC | PRN

## 2022-08-06 MED ORDER — NYSTATIN 100000 UNIT/ML MT SUSP: 5.0000 mL | Freq: Four times a day (QID) | OROMUCOSAL | 2 refills | Status: DC

## 2022-08-06 MED ORDER — CITALOPRAM HYDROBROMIDE 10 MG PO TABS: 10.0000 mg | ORAL_TABLET | Freq: Every day | ORAL | 3 refills | Status: DC

## 2022-08-06 NOTE — Assessment & Plan Note (Signed)
Mild to mod, for celexa 10 qd, declines referral psychiatry or counseling,,  to f/u any worsening symptoms or concerns

## 2022-08-06 NOTE — Progress Notes (Signed)
Patient ID: Thomas Raymond, male   DOB: 16-Dec-1945, 77 y.o.   MRN: [id number]        Chief Complaint: follow up productive cough x 2 wks, allergies, thrush, depression       HPI:  Thomas Raymond is a 77 y.o. male Here with acute onset mild to mod 2-3 days ST, HA, general weakness and malaise, with prod cough clearish sputum, but Pt denies chest pain, increased sob or doe, wheezing, orthopnea, PND, increased LE swelling, palpitations, dizziness or syncope.  Does have several wks ongoing nasal allergy symptoms with clearish congestion, itch and sneezing, without fever, pain, ST, cough, swelling or wheezing.  Also has worsening thrush like white rash to tongue.  Also has had 1-2 mo worsening depressive symptoms, but no suicidal ideation, or panic.  Current symptoms not bette with dayquil or nyquil.         Wt Readings from Last 3 Encounters:  04/12/22 138 lb 10.7 oz (62.9 kg)  11/15/19 140 lb 3.4 oz (63.6 kg)  09/19/16 151 lb (68.5 kg)   BP Readings from Last 3 Encounters:  08/06/22 124/80  06/11/22 122/76  04/12/22 131/67         Past Medical History:  Diagnosis Date   ALLERGIC RHINITIS    ALS    HYPERLIPIDEMIA    HYPERTENSION    Impaired glucose tolerance 02/17/2011   LOW BACK PAIN    VERTIGO    Past Surgical History:  Procedure Laterality Date   BRONCHIAL WASHINGS  04/08/2022   Procedure: BRONCHIAL WASHINGS;  Surgeon: Oretha Milch, MD;  Location: [facility] ENDOSCOPY;  Service: Cardiopulmonary;;   CHOLECYSTECTOMY N/A 11/15/2019   Procedure: LAPAROSCOPIC CHOLECYSTECTOMY;  Surgeon: Abigail Miyamoto, MD;  Location: [facility] OR;  Service: General;  Laterality: N/A;   COMPRESSION HIP SCREW Left 02/07/2014   Procedure: IM Nail;  Surgeon: Harvie Junior, MD;  Location: [facility] ORS;  Service: Orthopedics;  Laterality: Left;   ERCP N/A 11/14/2019   Procedure: ENDOSCOPIC RETROGRADE CHOLANGIOPANCREATOGRAPHY (ERCP);  Surgeon: Hilarie Fredrickson, MD;  Location: [facility] ENDOSCOPY;  Service: Endoscopy;  Laterality: N/A;   IR  GASTROSTOMY TUBE MOD SED  04/10/2022   IR GENERIC HISTORICAL  11/21/2015   IR RADIOLOGIST EVAL & MGMT 11/21/2015 Oley Balm, MD [facility] INTERV RAD   Lipoma removal  2003   Percutaneous gastrostomy tube repalcement     04/1999 and removed 07/1999   REMOVAL OF STONES  11/14/2019   Procedure: REMOVAL OF STONES;  Surgeon: Hilarie Fredrickson, MD;  Location: [facility] ENDOSCOPY;  Service: Endoscopy;;   SPHINCTEROTOMY  11/14/2019   Procedure: Dennison Mascot;  Surgeon: Hilarie Fredrickson, MD;  Location: [facility] ENDOSCOPY;  Service: Endoscopy;;   TRACHEOSTOMY     VIDEO BRONCHOSCOPY Right 04/08/2022   Procedure: VIDEO BRONCHOSCOPY WITHOUT FLUORO;  Surgeon: Oretha Milch, MD;  Location: [facility] ENDOSCOPY;  Service: Cardiopulmonary;  Laterality: Right;    reports that he has never smoked. He has never used smokeless tobacco. He reports that he does not drink alcohol and does not use drugs. family history includes Other in his father and mother; Other (age of onset: 2) in his daughter and son. No Known Allergies Current Outpatient Medications on File Prior to Visit  Medication Sig Dispense Refill   acetaminophen (TYLENOL) 325 MG tablet Take 2 tablets (650 mg total) by mouth every 6 (six) hours as needed for mild pain or moderate pain (or temp > 100). Take via tube 20 tablet 0   amLODipine (NORVASC) 10 MG tablet Take  10 mg by mouth daily.     cetirizine (ZYRTEC) 10 MG tablet Take 1 tablet (10 mg total) by mouth daily as needed. Take via tube 90 tablet 3   gabapentin (NEURONTIN) 250 MG/5ML solution Place 6 mLs (300 mg total) into feeding tube 3 (three) times daily. 470 mL 5   hydrochlorothiazide (MICROZIDE) 12.5 MG capsule Take 12.5 mg by mouth daily.     loperamide HCl (IMODIUM) 1 MG/7.5ML suspension Place 15 mLs (2 mg total) into feeding tube every 6 (six) hours as needed for diarrhea or loose stools. 200 mL 0   metoprolol succinate (TOPROL-XL) 50 MG 24 hr tablet Take 50 mg by mouth daily.     Metoprolol Tartrate (FIRST - METOPROLOL)  10 MG/ML SOLN place 2.5 mls into feeding tube 2 times daily 600 mL 0   omeprazole (PRILOSEC) 20 MG capsule Take 1 capsule (20 mg total) by mouth daily. 90 capsule 3   rosuvastatin (CRESTOR) 20 MG tablet Place 1 tablet (20 mg total) into feeding tube daily.     triamcinolone (NASACORT) 55 MCG/ACT AERO nasal inhaler Place 2 sprays into the nose daily. 1 each 12   metoprolol tartrate (LOPRESSOR) 25 mg/10 mL SUSP Place 10 mLs (25 mg total) into feeding tube 2 (two) times daily. 600 mL 2   No current facility-administered medications on file prior to visit.        ROS:  All others reviewed and negative.  Objective        PE:  BP 124/80 (BP Location: Right Arm, Patient Position: Sitting, Cuff Size: Normal)   Pulse 60   Temp 98.2 F (36.8 C) (Oral)   Ht  (1.549 m)   SpO2 98%   BMI 26.20 kg/m                 Constitutional: Pt appears in NAD               HENT: Head: NCAT.                Right Ear: External ear normal.                 Left Ear: External ear normal. Bilat tm's with mild erythema.  Max sinus areas non tender.  Pharynx with mild erythema, no exudate               Eyes: . Pupils are equal, round, and reactive to light. Conjunctivae and EOM are normal               Nose: without d/c or deformity               Neck: Neck supple. Gross normal ROM               Cardiovascular: Normal rate and regular rhythm.                 Pulmonary/Chest: Effort normal and breath sounds decreased without rales or wheezing.                Abd:  Soft, NT, ND, + BS, no organomegaly               Neurological: Pt is alert. At baseline orientation, cn 2-12 intact               Skin: Skin is warm. No rashes, no other new lesions, LE edema - none  Psychiatric: Pt behavior is normal without agitation   Micro: none  Cardiac tracings I have personally interpreted today:  none  Pertinent Radiological findings (summarize): none   Lab Results  Component Value Date   WBC 6.5  06/11/2022   HGB 13.2 06/11/2022   HCT 40.7 06/11/2022   PLT 305.0 06/11/2022   GLUCOSE 110 (H) 06/11/2022   CHOL 106 06/11/2022   TRIG 126.0 06/11/2022   HDL 39.10 06/11/2022   LDLDIRECT 126.4 09/22/2006   LDLCALC 42 06/11/2022   ALT 84 (H) 06/11/2022   AST 59 (H) 06/11/2022   NA 136 06/11/2022   K 4.9 06/11/2022   CL 100 06/11/2022   CREATININE 0.25 (L) 06/11/2022   BUN 24 (H) 06/11/2022   CO2 28 06/11/2022   TSH 1.69 06/11/2022   PSA 0.33 06/11/2022   INR 1.0 04/10/2022   HGBA1C 5.9 06/11/2022   Assessment/Plan:  Thomas Raymond is a 78 y.o. Asian [4] male with  has a past medical history of ALLERGIC RHINITIS, ALS, HYPERLIPIDEMIA, HYPERTENSION, Impaired glucose tolerance (02/17/2011), LOW BACK PAIN, and VERTIGO.  Allergic rhinitis Mild to mod, for depomedrol I'm 80 mg,  to f/u any worsening symptoms or concerns  Cough Likely recent viral illness - for cough med prn,  to f/u any worsening symptoms or concerns  Thrush Mild, for nystatin soln asd,  to f/u any worsening symptoms or concerns  Depression Mild to mod, for celexa 10 qd, declines referral psychiatry or counseling,,  to f/u any worsening symptoms or concerns    Followup: Return in about 6 months (around 02/05/2023).  Oliver Barre, MD 08/06/2022 9:34 PM [facility]

## 2022-08-06 NOTE — Assessment & Plan Note (Signed)
Mild to mod, for depomedrol I'm 80 mg,  to f/u any worsening symptoms or concerns 

## 2022-08-06 NOTE — Patient Instructions (Addendum)
You had the steroid shot today  Please take all new medication as prescribed - the cough medicine, celexa 10 mg per day for mood, and nystatin solution for the thrush  Please continue all other medications as before, and refills have been done if requested.  Please have the pharmacy call with any other refills you may need.  Please keep your appointments with your specialists as you may have planned  Please make an Appointment to return in 6 months, or sooner if needed

## 2022-08-06 NOTE — Assessment & Plan Note (Signed)
Mild, for nystatin soln asd,  to f/u any worsening symptoms or concerns 

## 2022-08-06 NOTE — Assessment & Plan Note (Signed)
Likely recent viral illness - for cough med prn,  to f/u any worsening symptoms or concerns

## 2022-08-26 ENCOUNTER — Telehealth: Payer: Self-pay | Admitting: Internal Medicine

## 2022-08-26 MED ORDER — HYDROCODONE BIT-HOMATROP MBR 5-1.5 MG/5ML PO SOLN: 5.0000 mL | Freq: Four times a day (QID) | ORAL | 0 refills | Status: DC | PRN

## 2022-08-26 NOTE — Telephone Encounter (Signed)
Caller & Relationship to patient: 82 - patient  Call back number: [phone]  Date of last office visit: 08-06-22  Date of next office visit: N/A  Medication(s) to be refilled: HYDROcodone bit-homatropine (HYDROMET) 5-1.5 MG/5ML syrup   Preferred Pharmacy: [id number] - [place], [place] - [address]

## 2022-08-26 NOTE — Addendum Note (Signed)
Addended by: Corwin Levins on: 08/26/2022 12:20 PM   Modules accepted: Orders

## 2022-08-26 NOTE — Telephone Encounter (Signed)
Done erx 

## 2022-08-30 ENCOUNTER — Other Ambulatory Visit: Payer: Self-pay | Admitting: Internal Medicine

## 2022-09-09 ENCOUNTER — Ambulatory Visit (INDEPENDENT_AMBULATORY_CARE_PROVIDER_SITE_OTHER): Payer: Medicare HMO

## 2022-09-09 ENCOUNTER — Ambulatory Visit (INDEPENDENT_AMBULATORY_CARE_PROVIDER_SITE_OTHER): Payer: Medicare HMO | Admitting: Internal Medicine

## 2022-09-09 ENCOUNTER — Encounter: Payer: Self-pay | Admitting: Internal Medicine

## 2022-09-09 VITALS — BP 110/72 | HR 50 | Temp 98.2°F | Ht 61.0 in

## 2022-09-09 DIAGNOSIS — R052 Subacute cough: Secondary | ICD-10-CM

## 2022-09-09 DIAGNOSIS — R059 Cough, unspecified: Secondary | ICD-10-CM | POA: Diagnosis not present

## 2022-09-09 MED ORDER — HYDROCODONE BIT-HOMATROP MBR 5-1.5 MG/5ML PO SOLN: 5.0000 mL | Freq: Four times a day (QID) | ORAL | 0 refills | Status: DC | PRN

## 2022-09-09 NOTE — Progress Notes (Signed)
Subjective:    Patient ID: Thomas Raymond, male    DOB: [birth year], 77 y.o.   MRN: [id number]      HPI Olivia is here for  Chief Complaint  Patient presents with   Cough    2 weeks; Rib pain from coughing Request refill on cough medication     Cough - just at night x 2 weeks.  He was here a couple weeks ago to see Dr. Jonny Ruiz regarding the cough.  The cough has gotten a little bit better.  He just has a cough at night and he does bring up some white phlegm.  He denies any shortness of breath, wheezing, heartburn, fever or other cold symptoms.  He did want to get a prescription for the cough syrup that he was given because that does help-if he takes it at night he does not cough.  He does sleep with his head elevated.      Medications and allergies reviewed with patient and updated if appropriate.  Current Outpatient Medications on File Prior to Visit  Medication Sig Dispense Refill   acetaminophen (TYLENOL) 325 MG tablet Take 2 tablets (650 mg total) by mouth every 6 (six) hours as needed for mild pain or moderate pain (or temp > 100). Take via tube 20 tablet 0   amLODipine (NORVASC) 10 MG tablet TAKE 1 TABLET (10 MG TOTAL) BY MOUTH DAILY. TAKE 1 TABLET (10 MG TOTAL) BY MOUTH DAILY. 90 tablet 2   cetirizine (ZYRTEC) 10 MG tablet Take 1 tablet (10 mg total) by mouth daily as needed. Take via tube 90 tablet 3   citalopram (CELEXA) 10 MG tablet Take 1 tablet (10 mg total) by mouth daily. 90 tablet 3   gabapentin (NEURONTIN) 250 MG/5ML solution Place 6 mLs (300 mg total) into feeding tube 3 (three) times daily. 470 mL 5   hydrochlorothiazide (MICROZIDE) 12.5 MG capsule TAKE 1 CAPSULE BY MOUTH EVERY DAY 90 capsule 2   HYDROcodone bit-homatropine (HYDROMET) 5-1.5 MG/5ML syrup Take 5 mLs by mouth every 6 (six) hours as needed for cough. Take via tube 180 mL 0   loperamide HCl (IMODIUM) 1 MG/7.5ML suspension Place 15 mLs (2 mg total) into feeding tube every 6 (six) hours as needed for diarrhea  or loose stools. 200 mL 0   metoprolol succinate (TOPROL-XL) 50 MG 24 hr tablet Take 50 mg by mouth daily.     Metoprolol Tartrate (FIRST - METOPROLOL) 10 MG/ML SOLN place 2.5 mls into feeding tube 2 times daily 600 mL 0   nystatin (MYCOSTATIN) 100000 UNIT/ML suspension Take 5 mLs (500,000 Units total) by mouth 4 (four) times daily. 180 mL 2   omeprazole (PRILOSEC) 20 MG capsule Take 1 capsule (20 mg total) by mouth daily. 90 capsule 3   rosuvastatin (CRESTOR) 20 MG tablet Place 1 tablet (20 mg total) into feeding tube daily.     triamcinolone (NASACORT) 55 MCG/ACT AERO nasal inhaler Place 2 sprays into the nose daily. 1 each 12   metoprolol tartrate (LOPRESSOR) 25 mg/10 mL SUSP Place 10 mLs (25 mg total) into feeding tube 2 (two) times daily. 600 mL 2   No current facility-administered medications on file prior to visit.    Review of Systems  Constitutional:  Negative for fever.  HENT:  Negative for congestion, ear pain, sinus pain and sore throat.   Respiratory:  Positive for cough (brings up mucus - litle bit - white). Negative for shortness of breath and wheezing.  Objective:   Vitals:   09/09/22 1120  BP: 110/72  Pulse: (!) 50  Temp: 98.2 F (36.8 C)  SpO2: 94%   BP Readings from Last 3 Encounters:  09/09/22 110/72  08/06/22 124/80  06/11/22 122/76   Wt Readings from Last 3 Encounters:  04/12/22 138 lb 10.7 oz (62.9 kg)  11/15/19 140 lb 3.4 oz (63.6 kg)  09/19/16 151 lb (68.5 kg)   Body mass index is 26.2 kg/m.    Physical Exam Constitutional:      General: He is not in acute distress.    Appearance: Normal appearance. He is not ill-appearing.  HENT:     Head: Normocephalic and atraumatic.     Mouth/Throat:     Mouth: Mucous membranes are moist.     Pharynx: No oropharyngeal exudate or posterior oropharyngeal erythema.  Cardiovascular:     Rate and Rhythm: Normal rate and regular rhythm.  Pulmonary:     Effort: Pulmonary effort is normal. No  respiratory distress.     Breath sounds: Rhonchi (bilateral bases - mild) present.  Abdominal:     Comments: PEG tube in place  Musculoskeletal:     Cervical back: Neck supple. No tenderness.  Lymphadenopathy:     Cervical: No cervical adenopathy.  Skin:    General: Skin is warm and dry.  Neurological:     Mental Status: He is alert.        DG Chest 2 View CLINICAL DATA:  Cough, ALS  EXAM: CHEST - 2 VIEW  COMPARISON:  04/08/2022  FINDINGS: Lungs are clear. Eventration of the right hemidiaphragm. No pleural effusion or pneumothorax.  The heart is normal in size.  Thoracic aortic atherosclerosis.  Visualized osseous structures are within normal limits.  IMPRESSION: Normal chest radiographs.  Electronically Signed   By: Charline Bills M.D.   On: 09/09/2022 12:12      Assessment & Plan:     Subacute cough: Was here 4 weeks ago - cough is still present but better Cough productive of white mucus - only cough at night No shortness of breath, wheeze or other concerning symptoms Chest x-ray today-normal.  No need for antibiotics Will renew cough syrup-Hycodan for nighttime since this is helping He is experiencing a little bit of pain in his PEG tube from the cough-suppressing the cough should help that-will hold off on any pain medication  GERD: Chronic He states GERD is well-controlled, the concern for cough only at night being related to reflux He is sleeping with his head elevated Currently taking omeprazole 20 mg daily-continue for now If no improvement in cough may need to consider increasing omeprazole

## 2022-09-09 NOTE — Patient Instructions (Addendum)
      Have a chest xray downstaris.      Medications changes include :   cough syrup      Return if symptoms worsen or fail to improve.

## 2022-09-27 DIAGNOSIS — G1221 Amyotrophic lateral sclerosis: Secondary | ICD-10-CM | POA: Diagnosis not present

## 2022-09-27 DIAGNOSIS — T17800A Unspecified foreign body in other parts of respiratory tract causing asphyxiation, initial encounter: Secondary | ICD-10-CM | POA: Diagnosis not present

## 2022-09-27 DIAGNOSIS — J690 Pneumonitis due to inhalation of food and vomit: Secondary | ICD-10-CM | POA: Diagnosis not present

## 2022-09-27 DIAGNOSIS — R1314 Dysphagia, pharyngoesophageal phase: Secondary | ICD-10-CM | POA: Diagnosis not present

## 2022-10-02 ENCOUNTER — Encounter: Payer: Self-pay | Admitting: Family Medicine

## 2022-10-02 ENCOUNTER — Ambulatory Visit (INDEPENDENT_AMBULATORY_CARE_PROVIDER_SITE_OTHER): Payer: Medicare HMO | Admitting: Family Medicine

## 2022-10-02 VITALS — BP 118/74 | HR 76 | Temp 97.8°F | Ht 61.0 in

## 2022-10-02 DIAGNOSIS — K219 Gastro-esophageal reflux disease without esophagitis: Secondary | ICD-10-CM | POA: Diagnosis not present

## 2022-10-02 DIAGNOSIS — R052 Subacute cough: Secondary | ICD-10-CM

## 2022-10-02 DIAGNOSIS — R062 Wheezing: Secondary | ICD-10-CM

## 2022-10-02 MED ORDER — HYDROCODONE BIT-HOMATROP MBR 5-1.5 MG/5ML PO SOLN
5.0000 mL | Freq: Three times a day (TID) | ORAL | 0 refills | Status: DC | PRN
Start: 2022-10-02 — End: 2022-10-11

## 2022-10-02 MED ORDER — AZITHROMYCIN 250 MG PO TABS: ORAL_TABLET | ORAL | 0 refills | Status: AC

## 2022-10-02 NOTE — Progress Notes (Signed)
Subjective:  Thomas Raymond is a 77 y.o. male who presents for cough x 4-6 wks.  Clear sputum. Denies worsening cough after eating or drinking.  He was seen her on 2 occasions for cough in the past 8 weeks.   Taking OTC cough medication.   Denies fever, chills, dizziness, chest pain, palpitations, shortness of breath, abdominal pain, N/V/D.      ROS as in subjective.   Objective: Vitals:   10/02/22 1048  BP: 118/74  Pulse: 76  Temp: 97.8 F (36.6 C)  SpO2: 97%    General appearance: Alert, WD/WN, no distress, mildly ill appearing                             Skin: warm, no rash                            Eyes: conjunctiva normal, corneas clear                            Ears: external ear canals normal                          Nose: septum midline, no drainage or congestion             Mouth/throat: MMM, tongue normal, mild pharyngeal erythema                           Neck: supple, no adenopathy, no thyromegaly, nontender                          Heart: RRR                         Lungs: mild exp wheezes, rales, or rhonchi      Assessment: Subacute cough - Plan: azithromycin (ZITHROMAX) 250 MG tablet, HYDROcodone bit-homatropine (HYCODAN) 5-1.5 MG/5ML syrup  Chronic GERD  Wheezing on left side of chest on exhalation - Plan: azithromycin (ZITHROMAX) 250 MG tablet   Plan: Reviewed chart including previous 2 visits for cough. Reviewed negative chest X ray.  Possible aspiration.  Treat with Z-pak and refilled Hycodan per request.  Follow up if not improving.

## 2022-10-02 NOTE — Patient Instructions (Signed)
Take the antibiotic as prescribed.   Use the cough syrup as needed at bedtime (causes drowsiness).   Follow up with Dr. Jonny Ruiz if not improving.

## 2022-10-04 ENCOUNTER — Other Ambulatory Visit: Payer: Self-pay | Admitting: Internal Medicine

## 2022-10-11 ENCOUNTER — Telehealth: Payer: Self-pay | Admitting: Internal Medicine

## 2022-10-11 DIAGNOSIS — R052 Subacute cough: Secondary | ICD-10-CM

## 2022-10-11 MED ORDER — HYDROCODONE BIT-HOMATROP MBR 5-1.5 MG/5ML PO SOLN
5.0000 mL | Freq: Three times a day (TID) | ORAL | 0 refills | Status: DC | PRN
Start: 2022-10-11 — End: 2023-02-11

## 2022-10-11 NOTE — Telephone Encounter (Signed)
Patient needs a refill on HYDROcodone bit-homatropine (HYCODAN) 5-1.5 MG/5ML syrup

## 2022-10-11 NOTE — Telephone Encounter (Signed)
Done erx 

## 2022-11-06 DIAGNOSIS — R6889 Other general symptoms and signs: Secondary | ICD-10-CM | POA: Diagnosis not present

## 2022-11-06 DIAGNOSIS — G1220 Motor neuron disease, unspecified: Secondary | ICD-10-CM | POA: Diagnosis not present

## 2022-11-11 ENCOUNTER — Other Ambulatory Visit: Payer: Self-pay | Admitting: Internal Medicine

## 2022-11-11 ENCOUNTER — Other Ambulatory Visit: Payer: Self-pay

## 2022-11-14 ENCOUNTER — Telehealth: Payer: Medicare HMO | Admitting: Internal Medicine

## 2022-11-14 ENCOUNTER — Telehealth: Payer: Self-pay | Admitting: Internal Medicine

## 2022-11-14 DIAGNOSIS — E559 Vitamin D deficiency, unspecified: Secondary | ICD-10-CM | POA: Diagnosis not present

## 2022-11-14 DIAGNOSIS — I10 Essential (primary) hypertension: Secondary | ICD-10-CM | POA: Diagnosis not present

## 2022-11-14 DIAGNOSIS — R059 Cough, unspecified: Secondary | ICD-10-CM | POA: Diagnosis not present

## 2022-11-14 DIAGNOSIS — R7302 Impaired glucose tolerance (oral): Secondary | ICD-10-CM

## 2022-11-14 NOTE — Progress Notes (Signed)
Patient ID: Thomas Raymond, male   DOB: February 17, 1946, 77 y.o.   MRN: [id number]  Virtual Visit via Video Note  I connected with Thomas Raymond on 11/16/22 at  1:00 PM EDT by a video enabled telemedicine application and verified that I am speaking with the correct person using two identifiers.  Location of all participants today Patient:  at home Provider: at office   I discussed the limitations of evaluation and management by telemedicine and the availability of in person appointments. The patient expressed understanding and agreed to proceed.  History of Present Illness: Pt with chronic cough , difficult to sleep at night, denies worsening overall and no fever, chills.  Pt denies chest pain, increased sob or doe, wheezing, orthopnea, PND, increased LE swelling, palpitations, dizziness or syncope.   Pt denies fever, night sweats, loss of appetite, or other constitutional symptoms    Pt denies polydipsia, polyuria, or new focal neuro s/s.    Past Medical History:  Diagnosis Date   ALLERGIC RHINITIS    ALS    HYPERLIPIDEMIA    HYPERTENSION    Impaired glucose tolerance 02/17/2011   LOW BACK PAIN    VERTIGO    Past Surgical History:  Procedure Laterality Date   BRONCHIAL WASHINGS  04/08/2022   Procedure: BRONCHIAL WASHINGS;  Surgeon: Oretha Milch, MD;  Location: [facility] ENDOSCOPY;  Service: Cardiopulmonary;;   CHOLECYSTECTOMY N/A 11/15/2019   Procedure: LAPAROSCOPIC CHOLECYSTECTOMY;  Surgeon: Abigail Miyamoto, MD;  Location: [facility] OR;  Service: General;  Laterality: N/A;   COMPRESSION HIP SCREW Left 02/07/2014   Procedure: IM Nail;  Surgeon: Harvie Junior, MD;  Location: [facility] ORS;  Service: Orthopedics;  Laterality: Left;   ERCP N/A 11/14/2019   Procedure: ENDOSCOPIC RETROGRADE CHOLANGIOPANCREATOGRAPHY (ERCP);  Surgeon: Hilarie Fredrickson, MD;  Location: [facility] ENDOSCOPY;  Service: Endoscopy;  Laterality: N/A;   IR GASTROSTOMY TUBE MOD SED  04/10/2022   IR GENERIC HISTORICAL  11/21/2015   IR RADIOLOGIST EVAL & MGMT  11/21/2015 Oley Balm, MD [facility] INTERV RAD   Lipoma removal  2003   Percutaneous gastrostomy tube repalcement     04/1999 and removed 07/1999   REMOVAL OF STONES  11/14/2019   Procedure: REMOVAL OF STONES;  Surgeon: Hilarie Fredrickson, MD;  Location: [facility] ENDOSCOPY;  Service: Endoscopy;;   SPHINCTEROTOMY  11/14/2019   Procedure: Dennison Mascot;  Surgeon: Hilarie Fredrickson, MD;  Location: [facility] ENDOSCOPY;  Service: Endoscopy;;   TRACHEOSTOMY     VIDEO BRONCHOSCOPY Right 04/08/2022   Procedure: VIDEO BRONCHOSCOPY WITHOUT FLUORO;  Surgeon: Oretha Milch, MD;  Location: [facility] ENDOSCOPY;  Service: Cardiopulmonary;  Laterality: Right;    reports that he has never smoked. He has never used smokeless tobacco. He reports that he does not drink alcohol and does not use drugs. family history includes Other in his father and mother; Other (age of onset: 2) in his daughter and son. No Known Allergies Current Outpatient Medications on File Prior to Visit  Medication Sig Dispense Refill   acetaminophen (TYLENOL) 325 MG tablet Take 2 tablets (650 mg total) by mouth every 6 (six) hours as needed for mild pain or moderate pain (or temp > 100). Take via tube 20 tablet 0   amLODipine (NORVASC) 10 MG tablet TAKE 1 TABLET (10 MG TOTAL) BY MOUTH DAILY. TAKE 1 TABLET (10 MG TOTAL) BY MOUTH DAILY. 90 tablet 2   cetirizine (ZYRTEC) 10 MG tablet Take 1 tablet (10 mg total) by mouth daily as needed. Take via tube 90 tablet  3   citalopram (CELEXA) 10 MG tablet Take 1 tablet (10 mg total) by mouth daily. 90 tablet 3   gabapentin (NEURONTIN) 250 MG/5ML solution Place 6 mLs (300 mg total) into feeding tube 3 (three) times daily. 470 mL 5   gabapentin (NEURONTIN) 300 MG capsule TAKE 1 CAPSULE BY MOUTH THREE TIMES A DAY 270 capsule 1   hydrochlorothiazide (MICROZIDE) 12.5 MG capsule TAKE 1 CAPSULE BY MOUTH EVERY DAY 90 capsule 2   HYDROcodone bit-homatropine (HYCODAN) 5-1.5 MG/5ML syrup Take 5 mLs by mouth every 8 (eight) hours as needed for  cough. 180 mL 0   loperamide HCl (IMODIUM) 1 MG/7.5ML suspension Place 15 mLs (2 mg total) into feeding tube every 6 (six) hours as needed for diarrhea or loose stools. 200 mL 0   metoprolol succinate (TOPROL-XL) 50 MG 24 hr tablet TAKE 1 TABLET BY MOUTH DAILY. TAKE WITH OR IMMEDIATELY FOLLOWING A MEAL. 90 tablet 3   Metoprolol Tartrate (FIRST - METOPROLOL) 10 MG/ML SOLN place 2.5 mls into feeding tube 2 times daily 600 mL 0   metoprolol tartrate (LOPRESSOR) 25 mg/10 mL SUSP Place 10 mLs (25 mg total) into feeding tube 2 (two) times daily. 600 mL 2   nystatin (MYCOSTATIN) 100000 UNIT/ML suspension Take 5 mLs (500,000 Units total) by mouth 4 (four) times daily. 180 mL 2   omeprazole (PRILOSEC) 20 MG capsule Take 1 capsule (20 mg total) by mouth daily. 90 capsule 3   rosuvastatin (CRESTOR) 20 MG tablet Place 1 tablet (20 mg total) into feeding tube daily.     triamcinolone (NASACORT) 55 MCG/ACT AERO nasal inhaler Place 2 sprays into the nose daily. 1 each 12   No current facility-administered medications on file prior to visit.    Observations/Objective: Alert, NAD, appropriate mood and affect, resps normal, cn 2-12 intact, moves all 4s, no visible rash or swelling Lab Results  Component Value Date   WBC 6.5 06/11/2022   HGB 13.2 06/11/2022   HCT 40.7 06/11/2022   PLT 305.0 06/11/2022   GLUCOSE 110 (H) 06/11/2022   CHOL 106 06/11/2022   TRIG 126.0 06/11/2022   HDL 39.10 06/11/2022   LDLDIRECT 126.4 09/22/2006   LDLCALC 42 06/11/2022   ALT 84 (H) 06/11/2022   AST 59 (H) 06/11/2022   NA 136 06/11/2022   K 4.9 06/11/2022   CL 100 06/11/2022   CREATININE 0.25 (L) 06/11/2022   BUN 24 (H) 06/11/2022   CO2 28 06/11/2022   TSH 1.69 06/11/2022   PSA 0.33 06/11/2022   INR 1.0 04/10/2022   HGBA1C 5.9 06/11/2022   Assessment and Plan: See notes  Follow Up Instructions: See notes   I discussed the assessment and treatment plan with the patient. The patient was provided an opportunity to  ask questions and all were answered. The patient agreed with the plan and demonstrated an understanding of the instructions.   The patient was advised to call back or seek an in-person evaluation if the symptoms worsen or if the condition fails to improve as anticipated.  Oliver Barre, MD

## 2022-11-14 NOTE — Telephone Encounter (Signed)
Pt saw MD this afternoon and cough was discussed. Recommended Delsym prn.Marland KitchenRaechel Chute

## 2022-11-14 NOTE — Patient Instructions (Signed)
Ok to try the OTC Delsym for more long term management of cough control at night  Please continue all other medications as before, and refills have been done if requested.  Please have the pharmacy call with any other refills you may need.  Please keep your appointments with your specialists as you may have planned

## 2022-11-14 NOTE — Telephone Encounter (Signed)
Patient called to request that a medicine be sent in for him for his cough.

## 2022-11-14 NOTE — Telephone Encounter (Signed)
Sorry no, this was discussed with family today - we cannot continue the hycodan indefinitely  Ok to try the OTC delsym prn

## 2022-11-16 ENCOUNTER — Encounter: Payer: Self-pay | Admitting: Internal Medicine

## 2022-11-16 NOTE — Assessment & Plan Note (Signed)
Chronic stable but persistent, pt requests refill hycodan, but this is not meant to be a long term sollution, needs to try OTC Delsym for now,  to f/u any worsening symptoms or concerns, consider for any worsening

## 2022-11-16 NOTE — Assessment & Plan Note (Signed)
Last vitamin D Lab Results  Component Value Date   VD25OH 28.02 (L) 06/11/2022   Low, reminded to start oral replacement

## 2022-11-16 NOTE — Assessment & Plan Note (Signed)
Lab Results  Component Value Date   HGBA1C 5.9 06/11/2022   Stable, pt to continue current medical treatment  - diet, wt control

## 2022-11-16 NOTE — Assessment & Plan Note (Signed)
BP Readings from Last 3 Encounters:  10/02/22 118/74  09/09/22 110/72  08/06/22 124/80   Stable recently, pt to continue medical treatment norvasc 10 every day, microzide 12.5 every day, toprol xl 50 qd

## 2022-11-18 ENCOUNTER — Telehealth: Payer: Self-pay | Admitting: Internal Medicine

## 2022-11-18 NOTE — Telephone Encounter (Signed)
Patient dropped off document Access [place] Application, to be filled out by provider. Patient requested to send it back via Mail within 7-days. Document is located in providers tray at front office.Please advise at [facility] [phone]

## 2022-11-19 NOTE — Telephone Encounter (Signed)
Placed on providers desk

## 2022-11-20 DIAGNOSIS — Z0279 Encounter for issue of other medical certificate: Secondary | ICD-10-CM

## 2022-11-21 NOTE — Telephone Encounter (Signed)
Forms completed and faxed.

## 2023-02-05 ENCOUNTER — Ambulatory Visit: Payer: Medicare HMO | Admitting: Internal Medicine

## 2023-02-11 ENCOUNTER — Encounter: Payer: Self-pay | Admitting: Internal Medicine

## 2023-02-11 ENCOUNTER — Ambulatory Visit (INDEPENDENT_AMBULATORY_CARE_PROVIDER_SITE_OTHER): Payer: Medicare HMO | Admitting: Internal Medicine

## 2023-02-11 VITALS — BP 120/72 | HR 96 | Temp 98.5°F | Ht 61.0 in

## 2023-02-11 DIAGNOSIS — Z23 Encounter for immunization: Secondary | ICD-10-CM

## 2023-02-11 DIAGNOSIS — E7800 Pure hypercholesterolemia, unspecified: Secondary | ICD-10-CM | POA: Diagnosis not present

## 2023-02-11 DIAGNOSIS — L03115 Cellulitis of right lower limb: Secondary | ICD-10-CM | POA: Diagnosis not present

## 2023-02-11 DIAGNOSIS — R7302 Impaired glucose tolerance (oral): Secondary | ICD-10-CM

## 2023-02-11 DIAGNOSIS — S91309A Unspecified open wound, unspecified foot, initial encounter: Secondary | ICD-10-CM | POA: Insufficient documentation

## 2023-02-11 DIAGNOSIS — R052 Subacute cough: Secondary | ICD-10-CM

## 2023-02-11 DIAGNOSIS — R739 Hyperglycemia, unspecified: Secondary | ICD-10-CM

## 2023-02-11 DIAGNOSIS — S91301A Unspecified open wound, right foot, initial encounter: Secondary | ICD-10-CM

## 2023-02-11 DIAGNOSIS — R6889 Other general symptoms and signs: Secondary | ICD-10-CM | POA: Diagnosis not present

## 2023-02-11 DIAGNOSIS — B370 Candidal stomatitis: Secondary | ICD-10-CM

## 2023-02-11 DIAGNOSIS — I10 Essential (primary) hypertension: Secondary | ICD-10-CM | POA: Diagnosis not present

## 2023-02-11 LAB — CBC WITH DIFFERENTIAL/PLATELET
Basophils Absolute: 0.1 10*3/uL (ref 0.0–0.1)
Basophils Relative: 1 % (ref 0.0–3.0)
Eosinophils Absolute: 0.2 10*3/uL (ref 0.0–0.7)
Eosinophils Relative: 2.3 % (ref 0.0–5.0)
HCT: 43.4 % (ref 39.0–52.0)
Hemoglobin: 13.8 g/dL (ref 13.0–17.0)
Lymphocytes Relative: 16.5 % (ref 12.0–46.0)
Lymphs Abs: 1.2 10*3/uL (ref 0.7–4.0)
MCHC: 31.9 g/dL (ref 30.0–36.0)
MCV: 91.8 fL (ref 78.0–100.0)
Monocytes Absolute: 0.8 10*3/uL (ref 0.1–1.0)
Monocytes Relative: 10.1 % (ref 3.0–12.0)
Neutro Abs: 5.3 10*3/uL (ref 1.4–7.7)
Neutrophils Relative %: 70.1 % (ref 43.0–77.0)
Platelets: 342 10*3/uL (ref 150.0–400.0)
RBC: 4.72 Mil/uL (ref 4.22–5.81)
RDW: 15.3 % (ref 11.5–15.5)
WBC: 7.5 10*3/uL (ref 4.0–10.5)

## 2023-02-11 LAB — TSH: TSH: 0.78 u[IU]/mL (ref 0.35–5.50)

## 2023-02-11 LAB — HEPATIC FUNCTION PANEL
ALT: 10 U/L (ref 0–53)
AST: 22 U/L (ref 0–37)
Albumin: 3.8 g/dL (ref 3.5–5.2)
Alkaline Phosphatase: 89 U/L (ref 39–117)
Bilirubin, Direct: 0.1 mg/dL (ref 0.0–0.3)
Total Bilirubin: 0.4 mg/dL (ref 0.2–1.2)
Total Protein: 8.2 g/dL (ref 6.0–8.3)

## 2023-02-11 LAB — LIPID PANEL
Cholesterol: 122 mg/dL (ref 0–200)
HDL: 50.9 mg/dL (ref >39.00)
LDL Cholesterol: 55 mg/dL (ref 0–99)
NonHDL: 71.04
Total CHOL/HDL Ratio: 2
Triglycerides: 78 mg/dL (ref 0.0–149.0)
VLDL: 15.6 mg/dL (ref 0.0–40.0)

## 2023-02-11 LAB — BASIC METABOLIC PANEL
BUN: 11 mg/dL (ref 6–23)
CO2: 29 meq/L (ref 19–32)
Calcium: 9.4 mg/dL (ref 8.4–10.5)
Chloride: 102 meq/L (ref 96–112)
Creatinine, Ser: 0.39 mg/dL — ABNORMAL LOW (ref 0.40–1.50)
GFR: 105.46 mL/min (ref >60.00)
Glucose, Bld: 102 mg/dL — ABNORMAL HIGH (ref 70–99)
Potassium: 3.7 meq/L (ref 3.5–5.1)
Sodium: 141 meq/L (ref 135–145)

## 2023-02-11 LAB — HEMOGLOBIN A1C: Hgb A1c MFr Bld: 5.7 % (ref 4.6–6.5)

## 2023-02-11 MED ORDER — SULFAMETHOXAZOLE-TRIMETHOPRIM 800-160 MG PO TABS: 1.0000 | ORAL_TABLET | Freq: Two times a day (BID) | ORAL | 0 refills | Status: DC

## 2023-02-11 MED ORDER — NYSTATIN 100000 UNIT/ML MT SUSP: 5.0000 mL | Freq: Four times a day (QID) | OROMUCOSAL | 2 refills | Status: DC

## 2023-02-11 MED ORDER — HYDROCODONE BIT-HOMATROP MBR 5-1.5 MG/5ML PO SOLN
5.0000 mL | Freq: Three times a day (TID) | ORAL | 0 refills | Status: DC | PRN
Start: 2023-02-11 — End: 2023-04-02

## 2023-02-11 NOTE — Assessment & Plan Note (Signed)
Quarter sized medial instep very shallow wound without abscess or drainage, for vaseline and routine wound care

## 2023-02-11 NOTE — Progress Notes (Signed)
Patient ID: Thomas Raymond, male   DOB: 02-24-1946, 77 y.o.   MRN: [id number]        Chief Complaint: follow up right leg cellulitis, sunburn to arms, thrush, chronic cough       HPI:  Thomas Raymond is a 77 y.o. male here with c/o sitting in the sunshine for over 12 hrs 3 days ago to enjoy the weather, fell asleep mostly, suffered severe sunburn to arms below the elbows with pain today, and LE bilateral below the knees; somehow also had some minor trauma to the right medial instep foot later that day, which has since worsened to red, swelling to the primarily right medial foot, ankle and leg almost to the knee with obvious infection.  Pt also has a degree of thrush to mouth, also has chronic cough related apparently to ALS and upper airway dysfunction.  Pt asking for symptoms control. Due for flu shot   Pt has hx of MRSA Wt Readings from Last 3 Encounters:  04/12/22 138 lb 10.7 oz (62.9 kg)  11/15/19 140 lb 3.4 oz (63.6 kg)  09/19/16 151 lb (68.5 kg)   BP Readings from Last 3 Encounters:  02/11/23 120/72  10/02/22 118/74  09/09/22 110/72         Past Medical History:  Diagnosis Date   ALLERGIC RHINITIS    ALS    HYPERLIPIDEMIA    HYPERTENSION    Impaired glucose tolerance 02/17/2011   LOW BACK PAIN    VERTIGO    Past Surgical History:  Procedure Laterality Date   BRONCHIAL WASHINGS  04/08/2022   Procedure: BRONCHIAL WASHINGS;  Surgeon: Oretha Milch, MD;  Location: [facility] ENDOSCOPY;  Service: Cardiopulmonary;;   CHOLECYSTECTOMY N/A 11/15/2019   Procedure: LAPAROSCOPIC CHOLECYSTECTOMY;  Surgeon: Abigail Miyamoto, MD;  Location: [facility] OR;  Service: General;  Laterality: N/A;   COMPRESSION HIP SCREW Left 02/07/2014   Procedure: IM Nail;  Surgeon: Harvie Junior, MD;  Location: [facility] ORS;  Service: Orthopedics;  Laterality: Left;   ERCP N/A 11/14/2019   Procedure: ENDOSCOPIC RETROGRADE CHOLANGIOPANCREATOGRAPHY (ERCP);  Surgeon: Hilarie Fredrickson, MD;  Location: [facility] ENDOSCOPY;  Service: Endoscopy;  Laterality:  N/A;   IR GASTROSTOMY TUBE MOD SED  04/10/2022   IR GENERIC HISTORICAL  11/21/2015   IR RADIOLOGIST EVAL & MGMT 11/21/2015 Oley Balm, MD [facility] INTERV RAD   Lipoma removal  2003   Percutaneous gastrostomy tube repalcement     04/1999 and removed 07/1999   REMOVAL OF STONES  11/14/2019   Procedure: REMOVAL OF STONES;  Surgeon: Hilarie Fredrickson, MD;  Location: [facility] ENDOSCOPY;  Service: Endoscopy;;   SPHINCTEROTOMY  11/14/2019   Procedure: Dennison Mascot;  Surgeon: Hilarie Fredrickson, MD;  Location: [facility] ENDOSCOPY;  Service: Endoscopy;;   TRACHEOSTOMY     VIDEO BRONCHOSCOPY Right 04/08/2022   Procedure: VIDEO BRONCHOSCOPY WITHOUT FLUORO;  Surgeon: Oretha Milch, MD;  Location: [facility] ENDOSCOPY;  Service: Cardiopulmonary;  Laterality: Right;    reports that he has never smoked. He has never used smokeless tobacco. He reports that he does not drink alcohol and does not use drugs. family history includes Other in his father and mother; Other (age of onset: 2) in his daughter and son. No Known Allergies Current Outpatient Medications on File Prior to Visit  Medication Sig Dispense Refill   acetaminophen (TYLENOL) 325 MG tablet Take 2 tablets (650 mg total) by mouth every 6 (six) hours as needed for mild pain or moderate pain (or temp > 100). Take via  tube 20 tablet 0   amLODipine (NORVASC) 10 MG tablet TAKE 1 TABLET (10 MG TOTAL) BY MOUTH DAILY. TAKE 1 TABLET (10 MG TOTAL) BY MOUTH DAILY. 90 tablet 2   cetirizine (ZYRTEC) 10 MG tablet Take 1 tablet (10 mg total) by mouth daily as needed. Take via tube 90 tablet 3   citalopram (CELEXA) 10 MG tablet Take 1 tablet (10 mg total) by mouth daily. 90 tablet 3   gabapentin (NEURONTIN) 250 MG/5ML solution Place 6 mLs (300 mg total) into feeding tube 3 (three) times daily. 470 mL 5   gabapentin (NEURONTIN) 300 MG capsule TAKE 1 CAPSULE BY MOUTH THREE TIMES A DAY 270 capsule 1   hydrochlorothiazide (MICROZIDE) 12.5 MG capsule TAKE 1 CAPSULE BY MOUTH EVERY DAY 90 capsule 2    loperamide HCl (IMODIUM) 1 MG/7.5ML suspension Place 15 mLs (2 mg total) into feeding tube every 6 (six) hours as needed for diarrhea or loose stools. 200 mL 0   metoprolol succinate (TOPROL-XL) 50 MG 24 hr tablet TAKE 1 TABLET BY MOUTH DAILY. TAKE WITH OR IMMEDIATELY FOLLOWING A MEAL. 90 tablet 3   Metoprolol Tartrate (FIRST - METOPROLOL) 10 MG/ML SOLN place 2.5 mls into feeding tube 2 times daily 600 mL 0   omeprazole (PRILOSEC) 20 MG capsule Take 1 capsule (20 mg total) by mouth daily. 90 capsule 3   rosuvastatin (CRESTOR) 20 MG tablet Place 1 tablet (20 mg total) into feeding tube daily.     triamcinolone (NASACORT) 55 MCG/ACT AERO nasal inhaler Place 2 sprays into the nose daily. 1 each 12   metoprolol tartrate (LOPRESSOR) 25 mg/10 mL SUSP Place 10 mLs (25 mg total) into feeding tube 2 (two) times daily. 600 mL 2   No current facility-administered medications on file prior to visit.        ROS:  All others reviewed and negative.  Objective        PE:  BP 120/72 (BP Location: Right Arm, Patient Position: Sitting, Cuff Size: Normal)   Pulse 96   Temp 98.5 F (36.9 C) (Oral)   Ht 5\' 1"  (1.549 m)   SpO2 97%   BMI 26.20 kg/m                 Constitutional: Pt appears mildly ill,               HENT: Head: NCAT.                Right Ear: External ear normal.                 Left Ear: External ear normal.                Eyes: . Pupils are equal, round, and reactive to light. Conjunctivae and EOM are normal               Nose: without d/c or deformity               Neck: Neck supple. Gross normal ROM               Cardiovascular: Normal rate and regular rhythm.                 Pulmonary/Chest: Effort normal and breath sounds without rales or wheezing.                               Neurological: Pt is alert.  At baseline orientation, motor grossly intact               Skin: severe sunburn without tissue loss to arms below the elbows mostly anteriorly, also lesser sunburn to legs below the  knees, but also with right medial foot very shallow wound 1 cm with 2+ red, tender swelling to medial foot, ankle and leg to few cm below the knee               Psychiatric: Pt behavior is normal without agitation   Micro: none  Cardiac tracings I have personally interpreted today:  none  Pertinent Radiological findings (summarize): none   Lab Results  Component Value Date   WBC 7.5 02/11/2023   HGB 13.8 02/11/2023   HCT 43.4 02/11/2023   PLT 342.0 02/11/2023   GLUCOSE 102 (H) 02/11/2023   CHOL 122 02/11/2023   TRIG 78.0 02/11/2023   HDL 50.90 02/11/2023   LDLDIRECT 126.4 09/22/2006   LDLCALC 55 02/11/2023   ALT 10 02/11/2023   AST 22 02/11/2023   NA 141 02/11/2023   K 3.7 02/11/2023   CL 102 02/11/2023   CREATININE 0.39 (L) 02/11/2023   BUN 11 02/11/2023   CO2 29 02/11/2023   TSH 0.78 02/11/2023   PSA 0.33 06/11/2022   INR 1.0 04/10/2022   HGBA1C 5.7 02/11/2023   Assessment/Plan:  Thomas Raymond is a 78 y.o. Asian [4] male with  has a past medical history of ALLERGIC RHINITIS, ALS, HYPERLIPIDEMIA, HYPERTENSION, Impaired glucose tolerance (02/17/2011), LOW BACK PAIN, and VERTIGO.  Open wound of foot Quarter sized medial instep very shallow wound without abscess or drainage, for vaseline and routine wound care  Thrush Mild to mod, for restart nuastatin soln,  to f/u any worsening symptoms or concerns  Impaired glucose tolerance Lab Results  Component Value Date   HGBA1C 5.7 02/11/2023   Stable, pt to continue current medical treatment  - diet,wt  control   Hyperlipidemia Lab Results  Component Value Date   LDLCALC 55 02/11/2023   Stable, pt to continue current statin crestor 20 qd   Essential hypertension BP Readings from Last 3 Encounters:  02/11/23 120/72  10/02/22 118/74  09/09/22 110/72   Stable, pt to continue medical treatment norvasc 10 every day, hct 12.5 every day, toprol xl 50 qd   Cellulitis of right leg Pt with presued mrsa given hx - for  septra dS bid course, to ED for any worsening  Cough For med refill prn use,  to f/u any worsening symptoms or concerns   Followup: Return in about 6 months (around 08/12/2023).  Oliver Barre, MD 02/15/2023 12:22 PM  Medical Group  Primary Care - [facility]

## 2023-02-11 NOTE — Patient Instructions (Signed)
Please take all new medication as prescribed - the antibiotic  Please keep the foot covered with gauze to help the healing until healed  If the redness and swelling gets worse above the knee instead of better, please to go ED in the next 1-2 days  Please continue all other medications as before, and refills have been done if requested - the cough medicine, and nystatin for thrush  Please have the pharmacy call with any other refills you may need.  Please keep your appointments with your specialists as you may have planned  Please go to the LAB at the blood drawing area for the tests to be done  You will be contacted by phone if any changes need to be made immediately.  Otherwise, you will receive a letter about your results with an explanation, but please check with MyChart first.  Please make an Appointment to return in 6 months, or sooner if needed

## 2023-02-11 NOTE — Progress Notes (Signed)
The test results show that your current treatment is OK, as the tests are stable.  Please continue the same plan.  There is no other need for change of treatment or further evaluation based on these results, at this time.  thanks 

## 2023-02-15 ENCOUNTER — Encounter: Payer: Self-pay | Admitting: Internal Medicine

## 2023-02-15 NOTE — Assessment & Plan Note (Signed)
Lab Results  Component Value Date   HGBA1C 5.7 02/11/2023   Stable, pt to continue current medical treatment  - diet,wt  control

## 2023-02-15 NOTE — Assessment & Plan Note (Signed)
For med refill prn use,  to f/u any worsening symptoms or concerns

## 2023-02-15 NOTE — Assessment & Plan Note (Signed)
Mild to mod, for restart nuastatin soln,  to f/u any worsening symptoms or concerns

## 2023-02-15 NOTE — Assessment & Plan Note (Signed)
Lab Results  Component Value Date   LDLCALC 55 02/11/2023   Stable, pt to continue current statin crestor 20 qd

## 2023-02-15 NOTE — Assessment & Plan Note (Signed)
BP Readings from Last 3 Encounters:  02/11/23 120/72  10/02/22 118/74  09/09/22 110/72   Stable, pt to continue medical treatment norvasc 10 every day, hct 12.5 every day, toprol xl 50 qd

## 2023-02-15 NOTE — Assessment & Plan Note (Signed)
Pt with presued mrsa given hx - for septra dS bid course, to ED for any worsening

## 2023-02-16 ENCOUNTER — Other Ambulatory Visit: Payer: Self-pay

## 2023-02-16 ENCOUNTER — Encounter (HOSPITAL_COMMUNITY): Payer: Self-pay

## 2023-02-16 ENCOUNTER — Emergency Department (HOSPITAL_COMMUNITY)
Admission: EM | Admit: 2023-02-16 | Discharge: 2023-02-16 | Disposition: A | Discharge status: Home / Self Care | Payer: Medicare HMO | Attending: Emergency Medicine | Admitting: Emergency Medicine

## 2023-02-16 ENCOUNTER — Emergency Department (HOSPITAL_COMMUNITY): Payer: Medicare HMO

## 2023-02-16 DIAGNOSIS — R0602 Shortness of breath: Secondary | ICD-10-CM | POA: Insufficient documentation

## 2023-02-16 DIAGNOSIS — Z1152 Encounter for screening for COVID-19: Secondary | ICD-10-CM | POA: Diagnosis not present

## 2023-02-16 DIAGNOSIS — T7840XA Allergy, unspecified, initial encounter: Secondary | ICD-10-CM | POA: Diagnosis not present

## 2023-02-16 DIAGNOSIS — T782XXA Anaphylactic shock, unspecified, initial encounter: Secondary | ICD-10-CM | POA: Diagnosis not present

## 2023-02-16 DIAGNOSIS — T50905A Adverse effect of unspecified drugs, medicaments and biological substances, initial encounter: Secondary | ICD-10-CM | POA: Insufficient documentation

## 2023-02-16 DIAGNOSIS — X141XXD Other contact with hot air and other hot gases, subsequent encounter: Secondary | ICD-10-CM | POA: Insufficient documentation

## 2023-02-16 DIAGNOSIS — L559 Sunburn, unspecified: Secondary | ICD-10-CM | POA: Insufficient documentation

## 2023-02-16 DIAGNOSIS — T25221D Burn of second degree of right foot, subsequent encounter: Secondary | ICD-10-CM | POA: Insufficient documentation

## 2023-02-16 DIAGNOSIS — R9389 Abnormal findings on diagnostic imaging of other specified body structures: Secondary | ICD-10-CM | POA: Diagnosis not present

## 2023-02-16 DIAGNOSIS — Z7401 Bed confinement status: Secondary | ICD-10-CM | POA: Diagnosis not present

## 2023-02-16 DIAGNOSIS — R531 Weakness: Secondary | ICD-10-CM | POA: Diagnosis not present

## 2023-02-16 DIAGNOSIS — I700 Atherosclerosis of aorta: Secondary | ICD-10-CM | POA: Diagnosis not present

## 2023-02-16 LAB — BRAIN NATRIURETIC PEPTIDE: B Natriuretic Peptide: 37.4 pg/mL (ref 0.0–100.0)

## 2023-02-16 LAB — BASIC METABOLIC PANEL
Anion gap: 9 (ref 5–15)
BUN: 17 mg/dL (ref 8–23)
CO2: 26 mmol/L (ref 22–32)
Calcium: 9 mg/dL (ref 8.9–10.3)
Chloride: 97 mmol/L — ABNORMAL LOW (ref 98–111)
Creatinine, Ser: 0.39 mg/dL — ABNORMAL LOW (ref 0.61–1.24)
GFR, Estimated: >60 mL/min (ref >60)
Glucose, Bld: 116 mg/dL — ABNORMAL HIGH (ref 70–99)
Potassium: 4.7 mmol/L (ref 3.5–5.1)
Sodium: 132 mmol/L — ABNORMAL LOW (ref 135–145)

## 2023-02-16 LAB — TROPONIN I (HIGH SENSITIVITY)
Troponin I (High Sensitivity): 5 ng/L (ref <18)
Troponin I (High Sensitivity): 5 ng/L (ref <18)

## 2023-02-16 LAB — CBC
HCT: 42.4 % (ref 39.0–52.0)
Hemoglobin: 13.6 g/dL (ref 13.0–17.0)
MCH: 28.5 pg (ref 26.0–34.0)
MCHC: 32.1 g/dL (ref 30.0–36.0)
MCV: 88.9 fL (ref 80.0–100.0)
Platelets: 300 10*3/uL (ref 150–400)
RBC: 4.77 MIL/uL (ref 4.22–5.81)
RDW: 14 % (ref 11.5–15.5)
WBC: 5 10*3/uL (ref 4.0–10.5)
nRBC: 0 % (ref 0.0–0.2)

## 2023-02-16 LAB — RESP PANEL BY RT-PCR (RSV, FLU A&B, COVID)  RVPGX2
Influenza A by PCR: NEGATIVE
Influenza B by PCR: NEGATIVE
Resp Syncytial Virus by PCR: NEGATIVE
SARS Coronavirus 2 by RT PCR: NEGATIVE

## 2023-02-16 MED ORDER — DOXYCYCLINE HYCLATE 100 MG PO CAPS: 100.0000 mg | ORAL_CAPSULE | Freq: Two times a day (BID) | ORAL | 0 refills | Status: DC
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
## 2023-02-16 NOTE — Discharge Instructions (Signed)
Stop current antibiotic.  Take doxycycline.  See your Physician for recheck in 2-3 days

## 2023-02-16 NOTE — ED Notes (Signed)
PTAR has been scheduled for the patient.

## 2023-02-16 NOTE — ED Triage Notes (Signed)
BIB EMS today from home. SOB. Felt SOB throughout the night  3 weeks ago was placed on bactrim and when he takes the medication he feels SOB. He took medication tonight and he started to feel SOB. He was placed on the antibiotic due to a sunburn that he got. SOB was not going away after he took the dose tonight.   Bed bound at baseline due to history of stroke  18g left forearm  158/82 74 HR 100% on Room Air 20 RR
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
## 2023-02-26 ENCOUNTER — Other Ambulatory Visit: Payer: Self-pay | Admitting: Internal Medicine

## 2023-02-26 ENCOUNTER — Other Ambulatory Visit: Payer: Self-pay

## 2023-03-28 ENCOUNTER — Ambulatory Visit: Payer: Medicare HMO | Admitting: Family Medicine

## 2023-04-02 ENCOUNTER — Ambulatory Visit (INDEPENDENT_AMBULATORY_CARE_PROVIDER_SITE_OTHER): Payer: Medicare HMO | Admitting: Family Medicine

## 2023-04-02 ENCOUNTER — Encounter: Payer: Self-pay | Admitting: Family Medicine

## 2023-04-02 VITALS — BP 112/80 | HR 72 | Temp 98.0°F | Ht 61.0 in

## 2023-04-02 DIAGNOSIS — B370 Candidal stomatitis: Secondary | ICD-10-CM

## 2023-04-02 DIAGNOSIS — R6889 Other general symptoms and signs: Secondary | ICD-10-CM | POA: Diagnosis not present

## 2023-04-02 DIAGNOSIS — R052 Subacute cough: Secondary | ICD-10-CM | POA: Diagnosis not present

## 2023-04-02 DIAGNOSIS — G1221 Amyotrophic lateral sclerosis: Secondary | ICD-10-CM | POA: Diagnosis not present

## 2023-04-02 MED ORDER — HYDROCODONE BIT-HOMATROP MBR 5-1.5 MG/5ML PO SOLN: 5.0000 mL | Freq: Three times a day (TID) | ORAL | 0 refills | Status: DC | PRN

## 2023-04-02 MED ORDER — NYSTATIN 100000 UNIT/ML MT SUSP: 5.0000 mL | Freq: Four times a day (QID) | OROMUCOSAL | 0 refills | Status: DC

## 2023-04-02 NOTE — Patient Instructions (Signed)
I have sent in hydrocodone cough syrup for you to take 5 mL once daily in the evening as needed for cough.  This medication may make you sleepy.  Do not drive or operate heavy machinery while taking this medication.  I have sent in nystatin for you to use to swish and rinse your mouth every 6 hours as needed for thrush.  Follow-up with me for new or worsening symptoms.

## 2023-04-02 NOTE — Progress Notes (Signed)
Acute Office Visit  Subjective:     Patient ID: Thomas Raymond, male    DOB: [birth year], 77 y.o.   MRN: [id number]  Chief Complaint  Patient presents with   Cough    About a week now and pt states that he cannot sleep at night, white substance on his tongue. Pt has tried medication but has not went away     HPI Patient is in today for evaluation of persistent cough as well as thrush. Requesting refill on cough syrup and Magic mouthwash. He is accompanied by his wife as well as in person interpreter. Has recently been on antibiotics for a wound to the foot. States that he was seen in the ER for shortness of breath, states that this is due to the Bactrim that he was on for the infection of his foot initially.  Antibiotics were changed to doxycycline, he has completed the course and the fact is healing well.  Denies known sick contacts. Denies abdominal pain, nausea vomiting diarrhea, rash, fever, chills, other symptoms. Medical history as outlined below.  ROS Per HPI      Objective:    BP 112/80 (BP Location: Left Arm, Patient Position: Sitting, Cuff Size: Normal)   Pulse 72   Temp 98 F (36.7 C) (Oral)   Ht 5\' 1"  (1.549 m)   SpO2 98%   BMI 26.20 kg/m    Physical Exam Vitals and nursing note reviewed.  Constitutional:      Comments: In motorized wheelchair  HENT:     Head: Normocephalic and atraumatic.     Nose: No congestion.     Mouth/Throat:     Mouth: Mucous membranes are moist.     Pharynx: Oropharynx is clear. No oropharyngeal exudate or posterior oropharyngeal erythema.     Comments: Tongue with thick white coating consistent with thrush Eyes:     Extraocular Movements: Extraocular movements intact.  Cardiovascular:     Rate and Rhythm: Normal rate and regular rhythm.  Pulmonary:     Effort: Pulmonary effort is normal. No respiratory distress.     Comments: Mild cough in office  Abdominal:     Comments: Feeding tube present  Musculoskeletal:     Cervical  back: Normal range of motion.  Lymphadenopathy:     Cervical: No cervical adenopathy.  Neurological:     Mental Status: He is alert. Mental status is at baseline.  Psychiatric:        Mood and Affect: Mood normal.        Behavior: Behavior normal.    No results found for any visits on 04/02/23.      Assessment & Plan:  1. Subacute cough  - HYDROcodone bit-homatropine (HYCODAN) 5-1.5 MG/5ML syrup; Take 5 mLs by mouth every 8 (eight) hours as needed for cough.  Dispense: 180 mL; Refill: 0 -Discussed using benzonatate, cannot be put through feeding tube and he has trouble swallowing  2. Thrush  - magic mouthwash (nystatin, lidocaine, diphenhydrAMINE, alum & mag hydroxide) suspension; Swish and spit 5 mLs 4 (four) times daily.  Dispense: 180 mL; Refill: 0  -Discussed this could be related to recent antibiotic use, states that it is persistent even throughout nystatin treatment. -Patient requested something different than nystatin -Will try Magic mouthwash  3. ALS  -Limits oral medication treatment options due to trouble swallowing  Meds ordered this encounter  Medications   HYDROcodone bit-homatropine (HYCODAN) 5-1.5 MG/5ML syrup    Sig: Take 5 mLs by mouth every 8 (eight)  hours as needed for cough.    Dispense:  180 mL    Refill:  0   magic mouthwash (nystatin, lidocaine, diphenhydrAMINE, alum & mag hydroxide) suspension    Sig: Swish and spit 5 mLs 4 (four) times daily.    Dispense:  180 mL    Refill:  0    Nystatin oral solution, benadryl elixir 12.5mg /27mL, maalox 1:1:1 ratio    Return if symptoms worsen or fail to improve.  Moshe Cipro, FNP

## 2023-04-06 ENCOUNTER — Other Ambulatory Visit: Payer: Self-pay

## 2023-04-06 ENCOUNTER — Encounter (HOSPITAL_COMMUNITY): Payer: Self-pay | Admitting: Internal Medicine

## 2023-04-06 ENCOUNTER — Emergency Department (HOSPITAL_COMMUNITY): Payer: Medicare HMO

## 2023-04-06 ENCOUNTER — Inpatient Hospital Stay (HOSPITAL_COMMUNITY)
Admission: EM | Admit: 2023-04-06 | Discharge: 2023-04-08 | DRG: 871 | Disposition: A | Discharge status: Home / Self Care | Payer: Medicare HMO | Attending: Internal Medicine | Admitting: Internal Medicine

## 2023-04-06 DIAGNOSIS — G1221 Amyotrophic lateral sclerosis: Secondary | ICD-10-CM | POA: Diagnosis present

## 2023-04-06 DIAGNOSIS — L89151 Pressure ulcer of sacral region, stage 1: Secondary | ICD-10-CM | POA: Diagnosis not present

## 2023-04-06 DIAGNOSIS — Z7189 Other specified counseling: Secondary | ICD-10-CM

## 2023-04-06 DIAGNOSIS — R1319 Other dysphagia: Secondary | ICD-10-CM | POA: Diagnosis present

## 2023-04-06 DIAGNOSIS — Z881 Allergy status to other antibiotic agents status: Secondary | ICD-10-CM | POA: Diagnosis not present

## 2023-04-06 DIAGNOSIS — R9389 Abnormal findings on diagnostic imaging of other specified body structures: Secondary | ICD-10-CM | POA: Diagnosis not present

## 2023-04-06 DIAGNOSIS — L8990 Pressure ulcer of unspecified site, unspecified stage: Secondary | ICD-10-CM

## 2023-04-06 DIAGNOSIS — J439 Emphysema, unspecified: Secondary | ICD-10-CM | POA: Diagnosis present

## 2023-04-06 DIAGNOSIS — I2510 Atherosclerotic heart disease of native coronary artery without angina pectoris: Secondary | ICD-10-CM | POA: Diagnosis present

## 2023-04-06 DIAGNOSIS — R0689 Other abnormalities of breathing: Secondary | ICD-10-CM | POA: Diagnosis not present

## 2023-04-06 DIAGNOSIS — F29 Unspecified psychosis not due to a substance or known physiological condition: Secondary | ICD-10-CM | POA: Diagnosis not present

## 2023-04-06 DIAGNOSIS — D71 Functional disorders of polymorphonuclear neutrophils: Secondary | ICD-10-CM | POA: Diagnosis not present

## 2023-04-06 DIAGNOSIS — J984 Other disorders of lung: Secondary | ICD-10-CM | POA: Diagnosis not present

## 2023-04-06 DIAGNOSIS — J9601 Acute respiratory failure with hypoxia: Secondary | ICD-10-CM | POA: Diagnosis not present

## 2023-04-06 DIAGNOSIS — Z931 Gastrostomy status: Secondary | ICD-10-CM

## 2023-04-06 DIAGNOSIS — Z79899 Other long term (current) drug therapy: Secondary | ICD-10-CM

## 2023-04-06 DIAGNOSIS — Z7401 Bed confinement status: Secondary | ICD-10-CM | POA: Diagnosis not present

## 2023-04-06 DIAGNOSIS — J690 Pneumonitis due to inhalation of food and vomit: Secondary | ICD-10-CM | POA: Diagnosis not present

## 2023-04-06 DIAGNOSIS — J180 Bronchopneumonia, unspecified organism: Secondary | ICD-10-CM | POA: Diagnosis not present

## 2023-04-06 DIAGNOSIS — J9600 Acute respiratory failure, unspecified whether with hypoxia or hypercapnia: Secondary | ICD-10-CM | POA: Diagnosis not present

## 2023-04-06 DIAGNOSIS — Z66 Do not resuscitate: Secondary | ICD-10-CM | POA: Diagnosis present

## 2023-04-06 DIAGNOSIS — Z1152 Encounter for screening for COVID-19: Secondary | ICD-10-CM | POA: Diagnosis not present

## 2023-04-06 DIAGNOSIS — F32A Depression, unspecified: Secondary | ICD-10-CM | POA: Diagnosis present

## 2023-04-06 DIAGNOSIS — J189 Pneumonia, unspecified organism: Secondary | ICD-10-CM | POA: Diagnosis not present

## 2023-04-06 DIAGNOSIS — I10 Essential (primary) hypertension: Secondary | ICD-10-CM | POA: Diagnosis present

## 2023-04-06 DIAGNOSIS — R0902 Hypoxemia: Secondary | ICD-10-CM | POA: Diagnosis not present

## 2023-04-06 DIAGNOSIS — R062 Wheezing: Secondary | ICD-10-CM | POA: Diagnosis not present

## 2023-04-06 DIAGNOSIS — J90 Pleural effusion, not elsewhere classified: Secondary | ICD-10-CM | POA: Diagnosis not present

## 2023-04-06 DIAGNOSIS — E785 Hyperlipidemia, unspecified: Secondary | ICD-10-CM | POA: Diagnosis present

## 2023-04-06 DIAGNOSIS — R6520 Severe sepsis without septic shock: Secondary | ICD-10-CM | POA: Diagnosis present

## 2023-04-06 DIAGNOSIS — A419 Sepsis, unspecified organism: Principal | ICD-10-CM | POA: Diagnosis present

## 2023-04-06 DIAGNOSIS — R000 Tachycardia, unspecified: Secondary | ICD-10-CM | POA: Diagnosis not present

## 2023-04-06 DIAGNOSIS — Z515 Encounter for palliative care: Secondary | ICD-10-CM | POA: Diagnosis not present

## 2023-04-06 DIAGNOSIS — Z603 Acculturation difficulty: Secondary | ICD-10-CM | POA: Diagnosis present

## 2023-04-06 DIAGNOSIS — R918 Other nonspecific abnormal finding of lung field: Secondary | ICD-10-CM | POA: Diagnosis not present

## 2023-04-06 DIAGNOSIS — R0600 Dyspnea, unspecified: Secondary | ICD-10-CM | POA: Diagnosis not present

## 2023-04-06 LAB — I-STAT CHEM 8, ED
BUN: 19 mg/dL (ref 8–23)
Calcium, Ion: 1.17 mmol/L (ref 1.15–1.40)
Chloride: 101 mmol/L (ref 98–111)
Creatinine, Ser: 0.5 mg/dL — ABNORMAL LOW (ref 0.61–1.24)
Glucose, Bld: 119 mg/dL — ABNORMAL HIGH (ref 70–99)
HCT: 45 % (ref 39.0–52.0)
Hemoglobin: 15.3 g/dL (ref 13.0–17.0)
Potassium: 3.9 mmol/L (ref 3.5–5.1)
Sodium: 140 mmol/L (ref 135–145)
TCO2: 31 mmol/L (ref 22–32)

## 2023-04-06 LAB — CBC WITH DIFFERENTIAL/PLATELET
Abs Immature Granulocytes: 0.15 10*3/uL — ABNORMAL HIGH (ref 0.00–0.07)
Basophils Absolute: 0 10*3/uL (ref 0.0–0.1)
Basophils Relative: 0 %
Eosinophils Absolute: 0 10*3/uL (ref 0.0–0.5)
Eosinophils Relative: 0 %
HCT: 44.2 % (ref 39.0–52.0)
Hemoglobin: 14 g/dL (ref 13.0–17.0)
Immature Granulocytes: 1 %
Lymphocytes Relative: 9 %
Lymphs Abs: 1.9 10*3/uL (ref 0.7–4.0)
MCH: 28.7 pg (ref 26.0–34.0)
MCHC: 31.7 g/dL (ref 30.0–36.0)
MCV: 90.6 fL (ref 80.0–100.0)
Monocytes Absolute: 1.8 10*3/uL — ABNORMAL HIGH (ref 0.1–1.0)
Monocytes Relative: 8 %
Neutro Abs: 18.4 10*3/uL — ABNORMAL HIGH (ref 1.7–7.7)
Neutrophils Relative %: 82 %
Platelets: 258 10*3/uL (ref 150–400)
RBC: 4.88 MIL/uL (ref 4.22–5.81)
RDW: 14.3 % (ref 11.5–15.5)
WBC: 22.4 10*3/uL — ABNORMAL HIGH (ref 4.0–10.5)
nRBC: 0 % (ref 0.0–0.2)

## 2023-04-06 LAB — COMPREHENSIVE METABOLIC PANEL
ALT: 12 U/L (ref 0–44)
AST: 19 U/L (ref 15–41)
Albumin: 2.6 g/dL — ABNORMAL LOW (ref 3.5–5.0)
Alkaline Phosphatase: 83 U/L (ref 38–126)
Anion gap: 5 (ref 5–15)
BUN: 16 mg/dL (ref 8–23)
CO2: 30 mmol/L (ref 22–32)
Calcium: 8.8 mg/dL — ABNORMAL LOW (ref 8.9–10.3)
Chloride: 100 mmol/L (ref 98–111)
Creatinine, Ser: 0.44 mg/dL — ABNORMAL LOW (ref 0.61–1.24)
GFR, Estimated: >60 mL/min (ref >60)
Glucose, Bld: 119 mg/dL — ABNORMAL HIGH (ref 70–99)
Potassium: 3.8 mmol/L (ref 3.5–5.1)
Sodium: 135 mmol/L (ref 135–145)
Total Bilirubin: 0.6 mg/dL (ref <1.2)
Total Protein: 7.1 g/dL (ref 6.5–8.1)

## 2023-04-06 LAB — EXPECTORATED SPUTUM ASSESSMENT W GRAM STAIN, RFLX TO RESP C

## 2023-04-06 LAB — I-STAT VENOUS BLOOD GAS, ED
Acid-Base Excess: 7 mmol/L — ABNORMAL HIGH (ref 0.0–2.0)
Bicarbonate: 33.9 mmol/L — ABNORMAL HIGH (ref 20.0–28.0)
Calcium, Ion: 1.17 mmol/L (ref 1.15–1.40)
HCT: 44 % (ref 39.0–52.0)
Hemoglobin: 15 g/dL (ref 13.0–17.0)
O2 Saturation: 51 %
Potassium: 3.9 mmol/L (ref 3.5–5.1)
Sodium: 140 mmol/L (ref 135–145)
TCO2: 36 mmol/L — ABNORMAL HIGH (ref 22–32)
pCO2, Ven: 53.8 mm[Hg] (ref 44–60)
pH, Ven: 7.407 (ref 7.25–7.43)
pO2, Ven: 28 mm[Hg] — CL (ref 32–45)

## 2023-04-06 LAB — GLUCOSE, CAPILLARY: Glucose-Capillary: 170 mg/dL — ABNORMAL HIGH (ref 70–99)

## 2023-04-06 LAB — PHOSPHORUS
Phosphorus: 1.8 mg/dL — ABNORMAL LOW (ref 2.5–4.6)
Phosphorus: 2.5 mg/dL (ref 2.5–4.6)
Phosphorus: 2.6 mg/dL (ref 2.5–4.6)

## 2023-04-06 LAB — MAGNESIUM
Magnesium: 1.9 mg/dL (ref 1.7–2.4)
Magnesium: 1.8 mg/dL (ref 1.7–2.4)
Magnesium: 1.8 mg/dL (ref 1.7–2.4)

## 2023-04-06 LAB — D-DIMER, QUANTITATIVE: D-Dimer, Quant: 1.22 ug{FEU}/mL — ABNORMAL HIGH (ref 0.00–0.50)

## 2023-04-06 LAB — RESP PANEL BY RT-PCR (RSV, FLU A&B, COVID)  RVPGX2
Influenza A by PCR: NEGATIVE
Influenza B by PCR: NEGATIVE
Resp Syncytial Virus by PCR: NEGATIVE
SARS Coronavirus 2 by RT PCR: NEGATIVE

## 2023-04-06 LAB — TROPONIN I (HIGH SENSITIVITY)
Troponin I (High Sensitivity): 46 ng/L — ABNORMAL HIGH (ref <18)
Troponin I (High Sensitivity): 40 ng/L — ABNORMAL HIGH (ref <18)

## 2023-04-06 LAB — I-STAT CG4 LACTIC ACID, ED: Lactic Acid, Venous: 1.6 mmol/L (ref 0.5–1.9)

## 2023-04-06 LAB — PROCALCITONIN: Procalcitonin: 4.2 ng/mL

## 2023-04-06 MED ORDER — ONDANSETRON HCL 4 MG/2ML IJ SOLN
4.0000 mg | Freq: Four times a day (QID) | INTRAMUSCULAR | Status: DC | PRN
Start: 2023-04-06 — End: 2023-04-08

## 2023-04-06 MED ORDER — OSMOLITE 1.5 CAL PO LIQD
1000.0000 mL | ORAL | Status: DC
  Administered 2023-04-06 – 2023-04-08 (×3): 1000 mL
  Filled 2023-04-06 (×4): qty 1000

## 2023-04-06 MED ORDER — SODIUM CHLORIDE 0.9 % IV SOLN
1.5000 g | Freq: Four times a day (QID) | INTRAVENOUS | Status: AC
  Administered 2023-04-06 – 2023-04-08 (×8): 1.5 g via INTRAVENOUS
  Filled 2023-04-06 (×11): qty 4

## 2023-04-06 MED ORDER — ACETAMINOPHEN 650 MG RE SUPP
650.0000 mg | Freq: Four times a day (QID) | RECTAL | Status: DC | PRN
Start: 2023-04-06 — End: 2023-04-06

## 2023-04-06 MED ORDER — ACETAMINOPHEN 650 MG RE SUPP: 650.0000 mg | Freq: Four times a day (QID) | RECTAL | Status: DC | PRN

## 2023-04-06 MED ORDER — ONDANSETRON HCL 4 MG PO TABS: 4.0000 mg | ORAL_TABLET | Freq: Four times a day (QID) | ORAL | Status: DC | PRN

## 2023-04-06 MED ORDER — ACETAMINOPHEN 160 MG/5ML PO SOLN
650.0000 mg | Freq: Once | ORAL | Status: DC
  Filled 2023-04-06: qty 20.3

## 2023-04-06 MED ORDER — IOHEXOL 350 MG/ML SOLN
75.0000 mL | Freq: Once | INTRAVENOUS | Status: AC | PRN
  Administered 2023-04-06: 75 mL via INTRAVENOUS

## 2023-04-06 MED ORDER — ACETAMINOPHEN 325 MG PO TABS: 650.0000 mg | ORAL_TABLET | Freq: Four times a day (QID) | ORAL | Status: DC | PRN

## 2023-04-06 MED ORDER — PIPERACILLIN-TAZOBACTAM 3.375 G IVPB 30 MIN
3.3750 g | Freq: Once | INTRAVENOUS | Status: AC
Start: 2023-04-06 — End: 2023-04-06
  Administered 2023-04-06: 3.375 g via INTRAVENOUS
  Filled 2023-04-06: qty 50

## 2023-04-06 MED ORDER — ACETAMINOPHEN 650 MG RE SUPP
650.0000 mg | Freq: Once | RECTAL | Status: AC
  Administered 2023-04-06: 650 mg via RECTAL
  Filled 2023-04-06: qty 1

## 2023-04-06 MED ORDER — FREE WATER
225.0000 mL | Freq: Four times a day (QID) | Status: DC
  Administered 2023-04-06 – 2023-04-08 (×9): 225 mL

## 2023-04-06 MED ORDER — IPRATROPIUM-ALBUTEROL 0.5-2.5 (3) MG/3ML IN SOLN
3.0000 mL | Freq: Once | RESPIRATORY_TRACT | Status: AC
  Administered 2023-04-06: 3 mL via RESPIRATORY_TRACT
  Filled 2023-04-06: qty 3

## 2023-04-06 MED ORDER — ENOXAPARIN SODIUM 40 MG/0.4ML IJ SOSY
40.0000 mg | PREFILLED_SYRINGE | INTRAMUSCULAR | Status: DC
  Administered 2023-04-06 – 2023-04-07 (×2): 40 mg via SUBCUTANEOUS
  Filled 2023-04-06 (×2): qty 0.4

## 2023-04-06 MED ORDER — PROSOURCE TF20 ENFIT COMPATIBL EN LIQD
60.0000 mL | Freq: Every day | ENTERAL | Status: DC
  Administered 2023-04-06 – 2023-04-08 (×3): 60 mL
  Filled 2023-04-06 (×2): qty 60

## 2023-04-06 MED ORDER — LACTATED RINGERS IV BOLUS (SEPSIS)
500.0000 mL | Freq: Once | INTRAVENOUS | Status: AC
  Administered 2023-04-06: 500 mL via INTRAVENOUS

## 2023-04-06 MED ORDER — ACETAMINOPHEN 325 MG PO TABS
650.0000 mg | ORAL_TABLET | Freq: Four times a day (QID) | ORAL | Status: DC | PRN
  Administered 2023-04-08: 650 mg
  Filled 2023-04-06: qty 2

## 2023-04-06 MED ORDER — LACTATED RINGERS IV SOLN: INTRAVENOUS | Status: AC

## 2023-04-06 NOTE — Assessment & Plan Note (Signed)
-   Wound care consult 

## 2023-04-06 NOTE — Sepsis Progress Note (Signed)
Elink following for sepsis protocol. 

## 2023-04-06 NOTE — Progress Notes (Signed)
   04/06/23 0542  Vitals  Temp 97.7 F (36.5 C)  Temp Source Oral  BP 127/78  MAP (mmHg) 94  BP Location Right Arm  BP Method Automatic  Patient Position (if appropriate) Lying  Pulse Rate 87  Pulse Rate Source Monitor  ECG Heart Rate 92  Resp 19  Level of Consciousness  Level of Consciousness Alert  MEWS COLOR  MEWS Score Color Green  Oxygen Therapy  SpO2 94 %  O2 Device HHFNC  Heater temperature 50 F (10 C)  MEWS Score  MEWS Temp 0  MEWS Systolic 0  MEWS Pulse 0  MEWS RR 0  MEWS LOC 0  MEWS Score 0   Pt admitted to [id number]. Pt oriented to unit. CCMD called. CHG bath given. Spouse at bedside. Call light within reach.

## 2023-04-06 NOTE — Progress Notes (Signed)
Pharmacy Antibiotic Note  Thomas Raymond is a 77 y.o. male admitted on 04/06/2023 with concern for aspiration pneumonia. Of note, pt does have ALS and PEG tube. Zosyn 3.375g x 1 in ED. Pharmacy has been consulted for unasyn dosing.  Plan: Unasyn 1.5g q6h  F/u renal function, length of therapy and narrow as able  Height: 5\' 1"  (154.9 cm) Weight: 62.9 kg (138 lb 10.7 oz) IBW/kg (Calculated) : 52.3  Temp (24hrs), Avg:99.2 F (37.3 C), Min:98.1 F (36.7 C), Max:102.2 F (39 C)  Recent Labs  Lab 04/06/23 0124 04/06/23 0125  WBC  --  22.4*  CREATININE 0.50* 0.44*  LATICACIDVEN  --  1.6    Estimated Creatinine Clearance: 60.8 mL/min (A) (by C-G formula based on SCr of 0.44 mg/dL (L)).    Allergies  Allergen Reactions   Bactrim [Sulfamethoxazole-Trimethoprim] Shortness Of Breath    Antimicrobials this admission: Zosyn 12/15  Unasyn 12/15 >  Microbiology results: 12/15 Bcx: IP  12/15 resp panel: negative  Thank you for allowing pharmacy to be a part of this patient's care.  Marja Kays 04/06/2023 4:02 AM

## 2023-04-06 NOTE — Progress Notes (Signed)
SLP Cancellation Note  Patient Details Name: Kaseem Benites MRN: [id number] DOB: [birth year]   Cancelled treatment:       Reason Eval/Treat Not Completed: Other (comment) SLP in secure message chat with attending MD and Palliative NP. Patient and family reportedly allow PO's for comfort and primary nutrition is via PEG. SLP to s/o at this time, please reorder if needed. Thank you for this consult!  Angela Nevin, MA, CCC-SLP Speech Therapy

## 2023-04-06 NOTE — ED Notes (Signed)
ED TO INPATIENT HANDOFF REPORT  ED Nurse Name and Phone #: [phone]  S Name/Age/Gender Thomas Raymond 77 y.o. male Room/Bed: TRAAC/TRAAC  Code Status   Code Status: Full Code  Home/SNF/Other Home Patient oriented to: self, place, time, and situation Is this baseline? Yes   Triage Complete: Triage complete  Chief Complaint Aspiration pneumonia (HCC) [J69.0]  Triage Note Pt BIB [facility] from home d/t Resp distress.  Family called - when EMS arrived he was 75 % on RA - they placed him on NRB and he was 86%.  After sitting him up he was 91% O2.  Pt is diminished and wheezy.   Allergies Allergies  Allergen Reactions   Bactrim [Sulfamethoxazole-Trimethoprim] Shortness Of Breath    Level of Care/Admitting Diagnosis ED Disposition     ED Disposition  Admit   Condition  --   Comment  Hospital Area: [facility] [[id number]]  Level of Care: Progressive [102]  Admit to Progressive based on following criteria: MULTISYSTEM THREATS such as stable sepsis, metabolic/electrolyte imbalance with or without encephalopathy that is responding to early treatment.  May admit patient to [facility] or [facility] if equivalent level of care is available:: No  Covid Evaluation: Asymptomatic - no recent exposure (last 10 days) testing not required  Diagnosis: Aspiration pneumonia [facility]) [[id number]]  Admitting Physician: Angie Fava [[id number]]  Attending Physician: Angie Fava [[id number]]  Certification:: I certify this patient will need inpatient services for at least 2 midnights  Expected Medical Readiness: 04/08/2023          B Medical/Surgery History Past Medical History:  Diagnosis Date   ALLERGIC RHINITIS    ALS    HYPERLIPIDEMIA    HYPERTENSION    Impaired glucose tolerance 02/17/2011   LOW BACK PAIN    VERTIGO    Past Surgical History:  Procedure Laterality Date   BRONCHIAL WASHINGS  04/08/2022   Procedure: BRONCHIAL WASHINGS;  Surgeon: Oretha Milch, MD;   Location: [facility];  Service: Cardiopulmonary;;   CHOLECYSTECTOMY N/A 11/15/2019   Procedure: LAPAROSCOPIC CHOLECYSTECTOMY;  Surgeon: Abigail Miyamoto, MD;  Location: [facility] OR;  Service: General;  Laterality: N/A;   COMPRESSION HIP SCREW Left 02/07/2014   Procedure: IM Nail;  Surgeon: Harvie Junior, MD;  Location: [facility] ORS;  Service: Orthopedics;  Laterality: Left;   ERCP N/A 11/14/2019   Procedure: ENDOSCOPIC RETROGRADE CHOLANGIOPANCREATOGRAPHY (ERCP);  Surgeon: Hilarie Fredrickson, MD;  Location: [facility] ENDOSCOPY;  Service: Endoscopy;  Laterality: N/A;   IR GASTROSTOMY TUBE MOD SED  04/10/2022   IR GENERIC HISTORICAL  11/21/2015   IR RADIOLOGIST EVAL & MGMT 11/21/2015 Oley Balm, MD [facility] INTERV RAD   Lipoma removal  2003   Percutaneous gastrostomy tube repalcement     04/1999 and removed 07/1999   REMOVAL OF STONES  11/14/2019   Procedure: REMOVAL OF STONES;  Surgeon: Hilarie Fredrickson, MD;  Location: [facility] ENDOSCOPY;  Service: Endoscopy;;   SPHINCTEROTOMY  11/14/2019   Procedure: Dennison Mascot;  Surgeon: Hilarie Fredrickson, MD;  Location: [facility] ENDOSCOPY;  Service: Endoscopy;;   TRACHEOSTOMY     VIDEO BRONCHOSCOPY Right 04/08/2022   Procedure: VIDEO BRONCHOSCOPY WITHOUT FLUORO;  Surgeon: Oretha Milch, MD;  Location: [facility] ENDOSCOPY;  Service: Cardiopulmonary;  Laterality: Right;     A IV Location/Drains/Wounds Patient Lines/Drains/Airways Status     Active Line/Drains/Airways     Name Placement date Placement time Site Days   Peripheral IV 04/06/23 20 G 1" Anterior;Distal;Right;Upper Arm 04/06/23  0150  Arm  less than 1            Intake/Output Last 24 hours  Intake/Output Summary (Last 24 hours) at 04/06/2023 0446 Last data filed at 04/06/2023 0251 Gross per 24 hour  Intake 550 ml  Output --  Net 550 ml    Labs/Imaging Results for orders placed or performed during the hospital encounter of 04/06/23 (from the past 48 hours)  I-stat chem 8, ED     Status: Abnormal   Collection Time: 04/06/23   1:24 AM  Result Value Ref Range   Sodium 140 135 - 145 mmol/L   Potassium 3.9 3.5 - 5.1 mmol/L   Chloride 101 98 - 111 mmol/L   BUN 19 8 - 23 mg/dL   Creatinine, Ser 7.82 (L) 0.61 - 1.24 mg/dL   Glucose, Bld 956 (H) 70 - 99 mg/dL    Comment: Glucose reference range applies only to samples taken after fasting for at least 8 hours.   Calcium, Ion 1.17 1.15 - 1.40 mmol/L   TCO2 31 22 - 32 mmol/L   Hemoglobin 15.3 13.0 - 17.0 g/dL   HCT 21.3 08.6 - 57.8 %  D-dimer, quantitative     Status: Abnormal   Collection Time: 04/06/23  1:25 AM  Result Value Ref Range   D-Dimer, Quant 1.22 (H) 0.00 - 0.50 ug/mL-FEU    Comment: (NOTE) At the manufacturer cut-off value of 0.5 g/mL FEU, this assay has a negative predictive value of 95-100%.This assay is intended for use in conjunction with a clinical pretest probability (PTP) assessment model to exclude pulmonary embolism (PE) and deep venous thrombosis (DVT) in outpatients suspected of PE or DVT. Results should be correlated with clinical presentation. Performed at [facility], [address], [place], [place] [address]   I-Stat Lactic Acid     Status: None   Collection Time: 04/06/23  1:25 AM  Result Value Ref Range   Lactic Acid, Venous 1.6 0.5 - 1.9 mmol/L  I-Stat venous blood gas, ED     Status: Abnormal   Collection Time: 04/06/23  1:25 AM  Result Value Ref Range   pH, Ven 7.407 7.25 - 7.43   pCO2, Ven 53.8 44 - 60 mmHg   pO2, Ven 28 (LL) 32 - 45 mmHg   Bicarbonate 33.9 (H) 20.0 - 28.0 mmol/L   TCO2 36 (H) 22 - 32 mmol/L   O2 Saturation 51 %   Acid-Base Excess 7.0 (H) 0.0 - 2.0 mmol/L   Sodium 140 135 - 145 mmol/L   Potassium 3.9 3.5 - 5.1 mmol/L   Calcium, Ion 1.17 1.15 - 1.40 mmol/L   HCT 44.0 39.0 - 52.0 %   Hemoglobin 15.0 13.0 - 17.0 g/dL   Sample type VENOUS    Comment NOTIFIED PHYSICIAN   CBC with Differential/Platelet     Status: Abnormal   Collection Time: 04/06/23  1:25 AM  Result Value Ref Range   WBC 22.4  (H) 4.0 - 10.5 K/uL   RBC 4.88 4.22 - 5.81 MIL/uL   Hemoglobin 14.0 13.0 - 17.0 g/dL   HCT 95.2 84.1 - 32.4 %   MCV 90.6 80.0 - 100.0 fL   MCH 28.7 26.0 - 34.0 pg   MCHC 31.7 30.0 - 36.0 g/dL   RDW 40.1 02.7 - 25.3 %   Platelets 258 150 - 400 K/uL   nRBC 0.0 0.0 - 0.2 %   Neutrophils Relative % 82 %   Neutro Abs 18.4 (H) 1.7 - 7.7 K/uL  Lymphocytes Relative 9 %   Lymphs Abs 1.9 0.7 - 4.0 K/uL   Monocytes Relative 8 %   Monocytes Absolute 1.8 (H) 0.1 - 1.0 K/uL   Eosinophils Relative 0 %   Eosinophils Absolute 0.0 0.0 - 0.5 K/uL   Basophils Relative 0 %   Basophils Absolute 0.0 0.0 - 0.1 K/uL   Immature Granulocytes 1 %   Abs Immature Granulocytes 0.15 (H) 0.00 - 0.07 K/uL    Comment: Performed at [facility], [address], [place], [place] [address]  Comprehensive metabolic panel     Status: Abnormal   Collection Time: 04/06/23  1:25 AM  Result Value Ref Range   Sodium 135 135 - 145 mmol/L   Potassium 3.8 3.5 - 5.1 mmol/L   Chloride 100 98 - 111 mmol/L   CO2 30 22 - 32 mmol/L   Glucose, Bld 119 (H) 70 - 99 mg/dL    Comment: Glucose reference range applies only to samples taken after fasting for at least 8 hours.   BUN 16 8 - 23 mg/dL   Creatinine, Ser 5.78 (L) 0.61 - 1.24 mg/dL   Calcium 8.8 (L) 8.9 - 10.3 mg/dL   Total Protein 7.1 6.5 - 8.1 g/dL   Albumin 2.6 (L) 3.5 - 5.0 g/dL   AST 19 15 - 41 U/L   ALT 12 0 - 44 U/L   Alkaline Phosphatase 83 38 - 126 U/L   Total Bilirubin 0.6 <1.2 mg/dL   GFR, Estimated >46 >96 mL/min    Comment: (NOTE) Calculated using the CKD-EPI Creatinine Equation (2021)    Anion gap 5 5 - 15    Comment: Performed at [facility], [address] [place], [place] [address]  Troponin I (High Sensitivity)     Status: Abnormal   Collection Time: 04/06/23  1:25 AM  Result Value Ref Range   Troponin I (High Sensitivity) 46 (H) <18 ng/L    Comment: (NOTE) Elevated high sensitivity troponin I (hsTnI) values and significant  changes  across serial measurements may suggest ACS but many other  chronic and acute conditions are known to elevate hsTnI results.  Refer to the "Links" section for chest pain algorithms and additional  guidance. Performed at [facility], [address] [place], [place] [address]   Magnesium     Status: None   Collection Time: 04/06/23  1:35 AM  Result Value Ref Range   Magnesium 1.9 1.7 - 2.4 mg/dL    Comment: Performed at [facility], [address], [place], [place] [address]  Phosphorus     Status: Abnormal   Collection Time: 04/06/23  1:35 AM  Result Value Ref Range   Phosphorus 1.8 (L) 2.5 - 4.6 mg/dL    Comment: Performed at [facility], [address], [place], [place] [address]  Resp panel by RT-PCR (RSV, Flu A&B, Covid) Anterior Nasal Swab     Status: None   Collection Time: 04/06/23  1:39 AM   Specimen: Anterior Nasal Swab  Result Value Ref Range   SARS Coronavirus 2 by RT PCR NEGATIVE NEGATIVE   Influenza A by PCR NEGATIVE NEGATIVE   Influenza B by PCR NEGATIVE NEGATIVE    Comment: (NOTE) The Xpert Xpress SARS-CoV-2/FLU/RSV plus assay is intended as an aid in the diagnosis of influenza from Nasopharyngeal swab specimens and should not be used as a sole basis for treatment. Nasal washings and aspirates are unacceptable for Xpert Xpress SARS-CoV-2/FLU/RSV  testing.  Fact Sheet for Patients: BloggerCourse.com  Fact Sheet for Healthcare Providers: SeriousBroker.it  This test is not yet approved or cleared by the [place] FDA and has been authorized for detection and/or diagnosis of SARS-CoV-2 by FDA under an Emergency Use Authorization (EUA). This EUA will remain in effect (meaning this test can be used) for the duration of the COVID-19 declaration under Section 564(b)(1) of the Act, 21 U.S.C. section 360bbb-3(b)(1), unless the authorization is terminated or revoked.     Resp Syncytial Virus by  PCR NEGATIVE NEGATIVE    Comment: (NOTE) Fact Sheet for Patients: BloggerCourse.com  Fact Sheet for Healthcare Providers: SeriousBroker.it  This test is not yet approved or cleared by the [place] FDA and has been authorized for detection and/or diagnosis of SARS-CoV-2 by FDA under an Emergency Use Authorization (EUA). This EUA will remain in effect (meaning this test can be used) for the duration of the COVID-19 declaration under Section 564(b)(1) of the Act, 21 U.S.C. section 360bbb-3(b)(1), unless the authorization is terminated or revoked.  Performed at [facility], [address] [place], [place] [address]   Troponin I (High Sensitivity)     Status: Abnormal   Collection Time: 04/06/23  2:55 AM  Result Value Ref Range   Troponin I (High Sensitivity) 40 (H) <18 ng/L    Comment: (NOTE) Elevated high sensitivity troponin I (hsTnI) values and significant  changes across serial measurements may suggest ACS but many other  chronic and acute conditions are known to elevate hsTnI results.  Refer to the "Links" section for chest pain algorithms and additional  guidance. Performed at [facility], [address] [place], [place] [address]    CT Angio Chest PE W/Cm &/Or Wo Cm Result Date: 04/06/2023 CLINICAL DATA:  Respiratory distress and positive D-dimer. EXAM: CT ANGIOGRAPHY CHEST WITH CONTRAST TECHNIQUE: Multidetector CT imaging of the chest was performed using the standard protocol during bolus administration of intravenous contrast. Multiplanar CT image reconstructions and MIPs were obtained to evaluate the vascular anatomy. RADIATION DOSE REDUCTION: This exam was performed according to the departmental dose-optimization program which includes automated exposure control, adjustment of the mA and/or kV according to patient size and/or use of iterative reconstruction technique. CONTRAST:  75mL OMNIPAQUE IOHEXOL 350  MG/ML SOLN COMPARISON:  Portable chest today, AP Lat chest 02/16/2023, PA Lat chest 09/09/2022, and chest CTs with contrast 04/07/2022 and 10/19/2015. FINDINGS: Cardiovascular: There is mild cardiomegaly. No pericardial effusion. Scattered three-vessel coronary artery calcifications. Mild-to-moderate calcific plaques are noted in the aorta with mild calcifications of the great vessels. There is no aneurysm, stenosis or dissection. There is mild aortic tortuosity. Pulmonary arteries are normal in caliber without evidence of arterial emboli. The pulmonary veins are nondistended. Mediastinum/Nodes: There are calcified bilateral hilar and mediastinal lymph nodes. Stable prominence of and azygoesophageal recess lymph node measuring 11 mm short axis on 4:66. No other significant lymph nodes. The lower poles of the thyroid gland the thoracic esophagus are unremarkable. There is scattered layering fluid in the distal trachea. Lungs/Pleura: Similar to the previous exam there is fluid and debris opacifying the right lower lobe bronchus with complete right lower lobe collapse/consolidation. There is consolidation of the majority of the right middle lobe as well, with relative peripheral sparing. In the right upper lobe there are thickened bronchi with the segmental bronchi showing plugging with fluid or mucoid material. The lungs are mildly emphysematous with centrilobular changes predominating in the upper lobes, biapical pleural-parenchymal scarring. Subpleural  reticulation appears similar in the anterior right upper lobe and there is a calcified granuloma in the medial right apex. Scattered airspace disease in the posterior segment of the right upper lobe is probably due to bronchopneumonia. Left lung is clear of infiltrates. There is a minimal layering right and no left pleural effusion. Right hemidiaphragm is chronically elevated to the level of the mid hilum consistent with eventration or paresis. Upper Abdomen: No acute  abnormality.  Status post cholecystectomy. Musculoskeletal: No chest wall abnormality. No acute or significant osseous findings. Review of the MIP images confirms the above findings. IMPRESSION: 1. No evidence of arterial emboli. 2. Fluid and debris opacifying the right lower lobe bronchus with complete right lower lobe collapse/consolidation; consolidation of the majority of the right middle lobe with relative peripheral sparing. Similar findings were noted on the 2 prior CTs. Findings suggest chronic aspiration. 3. Thickened right upper lobe bronchi with segmental bronchial plugging with fluid or mucoid material. 4. Scattered airspace disease in the posterior segment of the right upper lobe probably due to bronchopneumonia. 5. Minimal layering right pleural effusion. 6. Emphysema. 7. Aortic and coronary artery atherosclerosis. 8. Chronically elevated right hemidiaphragm consistent with eventration or paresis. 9. Old granulomatous disease. Aortic Atherosclerosis (ICD10-I70.0) and Emphysema (ICD10-J43.9). Electronically Signed   By: Almira Bar M.D.   On: 04/06/2023 03:05   DG Chest Portable 1 View Result Date: 04/06/2023 CLINICAL DATA:  Dyspnea EXAM: PORTABLE CHEST 1 VIEW COMPARISON:  02/16/2023 FINDINGS: There is near complete collapse of the right lower and middle lobes with right-sided volume loss and elevation of right hemidiaphragm. This appears similar to more remote prior examination of 04/08/2022. Left lung is clear. No pneumothorax or pleural effusion. Cardiac size within normal limits. Pulmonary vascularity is normal. No acute bone abnormality. IMPRESSION: 1. Near complete collapse of the right lower and middle lobes with right-sided volume loss and elevation of right hemidiaphragm. This appears similar to more remote prior examination of 04/08/2022. Correlation for a central obstructing lesion such as a mucous plug or aspirated material may be helpful. Electronically Signed   By: Helyn Numbers  M.D.   On: 04/06/2023 01:14    Pending Labs Unresulted Labs (From admission, onward)     Start     Ordered   04/06/23 0356  Procalcitonin  Add-on,   AD       References:    Procalcitonin Lower Respiratory Tract Infection AND Sepsis Procalcitonin Algorithm   04/06/23 0355   04/06/23 0113  Culture, blood (routine x 2)  BLOOD CULTURE X 2,   R      04/06/23 0112   04/06/23 0102  CBC with Differential  Once,   STAT        04/06/23 0101            Vitals/Pain Today's Vitals   04/06/23 0251 04/06/23 0315 04/06/23 0355 04/06/23 0356  BP:  102/60 98/61   Pulse:  96 97   Resp:  (!) 22 20   Temp: 98.2 F (36.8 C)  98.1 F (36.7 C)   TempSrc: Oral  Oral   SpO2:  98% 96%   Weight:      Height:      PainSc:    0-No pain    Isolation Precautions No active isolations  Medications Medications  lactated ringers infusion ( Intravenous New Bag/Given 04/06/23 0252)  acetaminophen (TYLENOL) tablet 650 mg (has no administration in time range)    Or  acetaminophen (TYLENOL) suppository 650 mg (has  no administration in time range)  ampicillin-sulbactam (UNASYN) 1.5 g in sodium chloride 0.9 % 100 mL IVPB (1.5 g Intravenous New Bag/Given 04/06/23 0428)  ipratropium-albuterol (DUONEB) 0.5-2.5 (3) MG/3ML nebulizer solution 3 mL (3 mLs Nebulization Given 04/06/23 0132)  piperacillin-tazobactam (ZOSYN) IVPB 3.375 g (0 g Intravenous Stopped 04/06/23 0249)  lactated ringers bolus 500 mL (0 mLs Intravenous Stopped 04/06/23 0251)  acetaminophen (TYLENOL) suppository 650 mg (650 mg Rectal Given 04/06/23 0159)  iohexol (OMNIPAQUE) 350 MG/ML injection 75 mL (75 mLs Intravenous Contrast Given 04/06/23 0230)    Mobility non-ambulatory     Focused Assessments     R Recommendations: See Admitting Provider Note  Report given to:   Additional Notes: Patient speaks Khmer however does understand some [place].

## 2023-04-06 NOTE — Assessment & Plan Note (Addendum)
On admission, 77 year old male with history of ALS and chronic aspiration who presented to ED with shortness of breath and noted to have oxygen in the 70%s in the field with EMS and placed on HFNC. CTA chest with imaging consistent with chronic aspiration.  -admit to progressive  -continue unasyn -sputum culture pending  -SLP to eval -NPO, nutrition consult for tube feedings  -palliative care consult -aspiration precautions -wean as tolerated   04-07-2023 seen by palliative care. Pt made DNR/DNI. Allow for comfort feedings. Will arrange home suction machine. Change to liquid augmentin via PEG tomorrow.  04-08-2023 pt qualified for home O2. "Pulse oximetry on room air is 87. Pt is on 2L NACL sitting at 95 ". This is documented by RN on 04-07-2023 @ 7:09 PM.  Will DC to home on 2 L/min

## 2023-04-06 NOTE — Assessment & Plan Note (Signed)
On Admission, Blood pressure soft to well controlled. Hold meds until [facility] completed. IV prn until this time   04-07-2023 will DC BP meds at discharge.

## 2023-04-06 NOTE — Assessment & Plan Note (Addendum)
Stable

## 2023-04-06 NOTE — H&P (Addendum)
History and Physical    PatientOctavion Raymond HQI:[id number] DOB: 1946-02-17 DOA: 04/06/2023 DOS: the patient was seen and examined on 04/06/2023 PCP: Corwin Levins, MD  Patient coming from: Home - lives with wife and son. Uses power chair for ambulation.    Translation service used with audio interpreter for duration of exam in cambodian.   Chief Complaint: shortness of breath   HPI: [name] is a 77 y.o. male with medical history significant of ALS, HTN, HLD, depression who presented to ED with shortness of breath and cough x 1 week. Cough is productive. He denies any fevers/chills. No sick contacts. Saw PCP on 12/11 and was given a hydrocodone syrup for his PEG tube. He denies any swelling in his legs. His wife reports some confusion prior to ED, but is at baseline now.   He has a PEG tube. He has been eating porridge and drinking through a straw. He uses his PEG tube 1x/day. He uses it for some medication. Wife sometimes will also give nourishment through this.   Denies any fever/chills, vision changes/headaches, chest pain or palpitations, shortness of breath or cough, abdominal pain, N/V/D, dysuria or leg swelling.   He does not smoke or drink alcohol.   ER Course:  vitals: afebrile, bp: 137/70, HR: 114, RR: 30, oxygen: 90% 10L HFNC Pertinent labs: wbc: 22.4, d-dimer: 1.22, troponin 46>40, PCT: 4.20,  CXR: Near complete collapse of the right lower and middle lobes with right-sided volume loss and elevation of right hemidiaphragm. This appears similar to more remote prior examination of 04/08/2022. Correlation for a central obstructing lesion such as a mucous plug or aspirated material may be helpful. CTA chest: No PE. Fluid and debris opacifying the right lower lobe bronchus with complete right lower lobe collapse/consolidation; consolidation of the majority of the right middle lobe with relative peripheral sparing. Similar findings were noted on the 2 prior CTs. Findings suggest  chronic aspiration. 3. Thickened right upper lobe bronchi with segmental bronchial plugging with fluid or mucoid material. 4. Scattered airspace disease in the posterior segment of the right upper lobe probably due to bronchopneumonia. 5. Minimal layering right pleural effusion. 6. Emphysema. 7. Aortic and coronary artery atherosclerosis. 8. Chronically elevated right hemidiaphragm consistent with eventration or paresis. 9. Old granulomatous disease. In ED: BC obtained. Started on unasyn. Palliative care consulted. IVF given.     Review of Systems: As mentioned in the history of present illness. All other systems reviewed and are negative. Past Medical History:  Diagnosis Date   ALLERGIC RHINITIS    ALS    HYPERLIPIDEMIA    HYPERTENSION    Impaired glucose tolerance 02/17/2011   LOW BACK PAIN    VERTIGO    Past Surgical History:  Procedure Laterality Date   BRONCHIAL WASHINGS  04/08/2022   Procedure: BRONCHIAL WASHINGS;  Surgeon: Oretha Milch, MD;  Location: [facility];  Service: Cardiopulmonary;;   CHOLECYSTECTOMY N/A 11/15/2019   Procedure: LAPAROSCOPIC CHOLECYSTECTOMY;  Surgeon: Abigail Miyamoto, MD;  Location: [facility] OR;  Service: General;  Laterality: N/A;   COMPRESSION HIP SCREW Left 02/07/2014   Procedure: IM Nail;  Surgeon: Harvie Junior, MD;  Location: [facility] ORS;  Service: Orthopedics;  Laterality: Left;   ERCP N/A 11/14/2019   Procedure: ENDOSCOPIC RETROGRADE CHOLANGIOPANCREATOGRAPHY (ERCP);  Surgeon: Hilarie Fredrickson, MD;  Location: [facility] ENDOSCOPY;  Service: Endoscopy;  Laterality: N/A;   IR GASTROSTOMY TUBE MOD SED  04/10/2022   IR GENERIC HISTORICAL  11/21/2015   IR RADIOLOGIST EVAL &  MGMT 11/21/2015 Oley Balm, MD [facility] INTERV RAD   Lipoma removal  2003   Percutaneous gastrostomy tube repalcement     04/1999 and removed 07/1999   REMOVAL OF STONES  11/14/2019   Procedure: REMOVAL OF STONES;  Surgeon: Hilarie Fredrickson, MD;  Location: [facility] ENDOSCOPY;  Service: Endoscopy;;    SPHINCTEROTOMY  11/14/2019   Procedure: Dennison Mascot;  Surgeon: Hilarie Fredrickson, MD;  Location: [facility] ENDOSCOPY;  Service: Endoscopy;;   TRACHEOSTOMY     VIDEO BRONCHOSCOPY Right 04/08/2022   Procedure: VIDEO BRONCHOSCOPY WITHOUT FLUORO;  Surgeon: Oretha Milch, MD;  Location: [facility] ENDOSCOPY;  Service: Cardiopulmonary;  Laterality: Right;   Social History:  reports that he has never smoked. He has never used smokeless tobacco. He reports that he does not drink alcohol and does not use drugs.  Allergies  Allergen Reactions   Bactrim [Sulfamethoxazole-Trimethoprim] Shortness Of Breath    Family History  Problem Relation Age of Onset   Other Mother        Deceased   Other Father        Deceased   Other Son 2       x 2 starvation/illness from [place]   Other Daughter 2       x 2 starvation/illness from [place]    Prior to Admission medications   Medication Sig Start Date End Date Taking? Authorizing Provider  gabapentin (NEURONTIN) 300 MG capsule TAKE 1 CAPSULE BY MOUTH THREE TIMES A DAY 10/04/22  Yes Corwin Levins, MD  HYDROcodone bit-homatropine (HYCODAN) 5-1.5 MG/5ML syrup Take 5 mLs by mouth every 8 (eight) hours as needed for cough. 04/02/23  Yes Moshe Cipro, FNP  omeprazole (PRILOSEC) 20 MG capsule Take 1 capsule (20 mg total) by mouth daily. 05/17/22  Yes Corwin Levins, MD  rosuvastatin (CRESTOR) 20 MG tablet TAKE 1 TABLET BY MOUTH EVERY DAY 02/26/23  Yes Corwin Levins, MD  acetaminophen (TYLENOL) 325 MG tablet Take 2 tablets (650 mg total) by mouth every 6 (six) hours as needed for mild pain or moderate pain (or temp > 100). Take via tube 04/12/22   Meredeth Ide, MD  amLODipine (NORVASC) 10 MG tablet TAKE 1 TABLET (10 MG TOTAL) BY MOUTH DAILY. TAKE 1 TABLET (10 MG TOTAL) BY MOUTH DAILY. 08/30/22   Corwin Levins, MD  cetirizine (ZYRTEC) 10 MG tablet Take 1 tablet (10 mg total) by mouth daily as needed. Take via tube 04/12/22   Meredeth Ide, MD  citalopram (CELEXA) 10  MG tablet Take 1 tablet (10 mg total) by mouth daily. 08/06/22 08/06/23  Corwin Levins, MD  hydrochlorothiazide (MICROZIDE) 12.5 MG capsule TAKE 1 CAPSULE BY MOUTH EVERY DAY 08/30/22   Corwin Levins, MD  magic mouthwash (nystatin, lidocaine, diphenhydrAMINE, alum & mag hydroxide) suspension Swish and spit 5 mLs 4 (four) times daily. 04/02/23   Moshe Cipro, FNP  metoprolol succinate (TOPROL-XL) 50 MG 24 hr tablet TAKE 1 TABLET BY MOUTH DAILY. TAKE WITH OR IMMEDIATELY FOLLOWING A MEAL. 11/11/22   Corwin Levins, MD  Metoprolol Tartrate (FIRST - METOPROLOL) 10 MG/ML SOLN place 2.5 mls into feeding tube 2 times daily Patient not taking: Reported on 04/06/2023 06/14/22   Corwin Levins, MD  metoprolol tartrate (LOPRESSOR) 25 mg/10 mL SUSP Place 10 mLs (25 mg total) into feeding tube 2 (two) times daily. Patient not taking: Reported on 04/06/2023 04/12/22 10/02/22  Meredeth Ide, MD  triamcinolone (NASACORT) 55 MCG/ACT AERO nasal inhaler Place  2 sprays into the nose daily. 01/24/22   Corwin Levins, MD    Physical Exam: Vitals:   04/06/23 0355 04/06/23 0542 04/06/23 0726 04/06/23 1053  BP: 98/61 127/78 115/71 120/69  Pulse: 97 87 69 77  Resp: 20 19 (!) 23 20  Temp: 98.1 F (36.7 C) 97.7 F (36.5 C) 97.9 F (36.6 C) 97.7 F (36.5 C)  TempSrc: Oral Oral Oral Oral  SpO2: 96% 94% 100% 100%  Weight:      Height:       General:  Appears calm and comfortable and is in NAD Eyes:  PERRL, EOMI, normal lids, iris ENT:  grossly normal hearing, lips & tongue, mmm; appropriate dentition Neck:  no LAD, masses or thyromegaly; no carotid bruits Cardiovascular:  RRR, no m/r/g. No LE edema.  Respiratory:   decreased breath sounds in RLL otherwise no wheezes/rales/rhonchi.  Normal respiratory effort. Abdomen:  soft, NT, ND, NABS Back:   normal alignment, no CVAT Skin:  no rash or induration seen on limited exam Musculoskeletal:  grossly normal tone BUE/BLE, good ROM, no bony abnormality Lower extremity:  No  LE edema.  Limited foot exam with no ulcerations.  2+ distal pulses. Psychiatric:  grossly normal mood and affect, speech fluent and appropriate, AOx3 Neurologic:  CN 2-12 grossly intact, moves all extremities in coordinated fashion, sensation intact   Radiological Exams on Admission: Independently reviewed - see discussion in A/P where applicable  CT Angio Chest PE W/Cm &/Or Wo Cm Result Date: 04/06/2023 CLINICAL DATA:  Respiratory distress and positive D-dimer. EXAM: CT ANGIOGRAPHY CHEST WITH CONTRAST TECHNIQUE: Multidetector CT imaging of the chest was performed using the standard protocol during bolus administration of intravenous contrast. Multiplanar CT image reconstructions and MIPs were obtained to evaluate the vascular anatomy. RADIATION DOSE REDUCTION: This exam was performed according to the departmental dose-optimization program which includes automated exposure control, adjustment of the mA and/or kV according to patient size and/or use of iterative reconstruction technique. CONTRAST:  75mL OMNIPAQUE IOHEXOL 350 MG/ML SOLN COMPARISON:  Portable chest today, AP Lat chest 02/16/2023, PA Lat chest 09/09/2022, and chest CTs with contrast 04/07/2022 and 10/19/2015. FINDINGS: Cardiovascular: There is mild cardiomegaly. No pericardial effusion. Scattered three-vessel coronary artery calcifications. Mild-to-moderate calcific plaques are noted in the aorta with mild calcifications of the great vessels. There is no aneurysm, stenosis or dissection. There is mild aortic tortuosity. Pulmonary arteries are normal in caliber without evidence of arterial emboli. The pulmonary veins are nondistended. Mediastinum/Nodes: There are calcified bilateral hilar and mediastinal lymph nodes. Stable prominence of and azygoesophageal recess lymph node measuring 11 mm short axis on 4:66. No other significant lymph nodes. The lower poles of the thyroid gland the thoracic esophagus are unremarkable. There is scattered  layering fluid in the distal trachea. Lungs/Pleura: Similar to the previous exam there is fluid and debris opacifying the right lower lobe bronchus with complete right lower lobe collapse/consolidation. There is consolidation of the majority of the right middle lobe as well, with relative peripheral sparing. In the right upper lobe there are thickened bronchi with the segmental bronchi showing plugging with fluid or mucoid material. The lungs are mildly emphysematous with centrilobular changes predominating in the upper lobes, biapical pleural-parenchymal scarring. Subpleural reticulation appears similar in the anterior right upper lobe and there is a calcified granuloma in the medial right apex. Scattered airspace disease in the posterior segment of the right upper lobe is probably due to bronchopneumonia. Left lung is clear of infiltrates. There is a  minimal layering right and no left pleural effusion. Right hemidiaphragm is chronically elevated to the level of the mid hilum consistent with eventration or paresis. Upper Abdomen: No acute abnormality.  Status post cholecystectomy. Musculoskeletal: No chest wall abnormality. No acute or significant osseous findings. Review of the MIP images confirms the above findings. IMPRESSION: 1. No evidence of arterial emboli. 2. Fluid and debris opacifying the right lower lobe bronchus with complete right lower lobe collapse/consolidation; consolidation of the majority of the right middle lobe with relative peripheral sparing. Similar findings were noted on the 2 prior CTs. Findings suggest chronic aspiration. 3. Thickened right upper lobe bronchi with segmental bronchial plugging with fluid or mucoid material. 4. Scattered airspace disease in the posterior segment of the right upper lobe probably due to bronchopneumonia. 5. Minimal layering right pleural effusion. 6. Emphysema. 7. Aortic and coronary artery atherosclerosis. 8. Chronically elevated right hemidiaphragm  consistent with eventration or paresis. 9. Old granulomatous disease. Aortic Atherosclerosis (ICD10-I70.0) and Emphysema (ICD10-J43.9). Electronically Signed   By: Almira Bar M.D.   On: 04/06/2023 03:05   DG Chest Portable 1 View Result Date: 04/06/2023 CLINICAL DATA:  Dyspnea EXAM: PORTABLE CHEST 1 VIEW COMPARISON:  02/16/2023 FINDINGS: There is near complete collapse of the right lower and middle lobes with right-sided volume loss and elevation of right hemidiaphragm. This appears similar to more remote prior examination of 04/08/2022. Left lung is clear. No pneumothorax or pleural effusion. Cardiac size within normal limits. Pulmonary vascularity is normal. No acute bone abnormality. IMPRESSION: 1. Near complete collapse of the right lower and middle lobes with right-sided volume loss and elevation of right hemidiaphragm. This appears similar to more remote prior examination of 04/08/2022. Correlation for a central obstructing lesion such as a mucous plug or aspirated material may be helpful. Electronically Signed   By: Helyn Numbers M.D.   On: 04/06/2023 01:14    EKG: Independently reviewed.  Sinus tachycardia with rate 109; nonspecific ST changes with no evidence of acute ischemia   Labs on Admission: I have personally reviewed the available labs and imaging studies at the time of the admission.  Pertinent labs:    wbc: 22.4,  d-dimer: 1.22,  troponin 46>40,  PCT: 4.20  Assessment and Plan: Principal Problem:   Acute respiratory failure with hypoxia secondary to aspiration pneumonia Active Problems:   Sepsis secondary to aspiration pneumonia   Other dysphagia   ALS (amyotrophic lateral sclerosis) (HCC)   Essential hypertension   Depression   Hyperlipidemia    Assessment and Plan: * Acute respiratory failure with hypoxia secondary to aspiration pneumonia 77 year old male with history of ALS and chronic aspiration who presented to ED with shortness of breath and noted to have  oxygen in the 70%s in the field with EMS and placed on HFNC. CTA chest with imaging consistent with chronic aspiration.  -admit to progressive  -continue unasyn -sputum culture pending  -SLP to eval -NPO, nutrition consult for tube feedings  -palliative care consult -aspiration precautions -wean as tolerated   Sepsis secondary to aspiration pneumonia Presenting with leukocytosis, tachycardia and tachypnea with respiratory failure in setting of aspiration pneumonia -given bolus and IVF In ED -BC obtained -lactic acid wnl  -continue unasyn -sputum cx ordered -PCT 4.20>trend   Other dysphagia Secondary to ALS  NPO, SLP eval Palliative care eval  Aspiration precautions   ALS (amyotrophic lateral sclerosis) (HCC) Palliative care consult In power wheel chair Chronic aspiration>PEG tube Is a DNR   Essential hypertension Blood  pressure soft to well controlled Hold meds until [facility] completed IV prn until this time   Depression MAR not completed Continue celexa if still on once completed   Hyperlipidemia Continue crestor per tube feeds     Advance Care Planning:   Code Status: Limited: Do not attempt resuscitation (DNR) -DNR-LIMITED -Do Not Intubate/DNI    Consults: palliative care/SLP/RT    DVT Prophylaxis: lovenox   Family Communication: wife at bedside   Severity of Illness: The appropriate patient status for this patient is INPATIENT. Inpatient status is judged to be reasonable and necessary in order to provide the required intensity of service to ensure the patient's safety. The patient's presenting symptoms, physical exam findings, and initial radiographic and laboratory data in the context of their chronic comorbidities is felt to place them at high risk for further clinical deterioration. Furthermore, it is not anticipated that the patient will be medically stable for discharge from the hospital within 2 midnights of admission.   * I certify that at the point of  admission it is my clinical judgment that the patient will require inpatient hospital care spanning beyond 2 midnights from the point of admission due to high intensity of service, high risk for further deterioration and high frequency of surveillance required.*  Author: Orland Mustard, MD 04/06/2023 12:11 PM  For on call review [email or web address].

## 2023-04-06 NOTE — ED Notes (Signed)
1st lactic in normal range 2nd can be discontinued

## 2023-04-06 NOTE — Plan of Care (Signed)

## 2023-04-06 NOTE — Assessment & Plan Note (Signed)
Continue crestor per tube feeds

## 2023-04-06 NOTE — Consult Note (Cosign Needed)
Consultation Note Date: 04/06/2023   Patient Name: Thomas Raymond  DOB: 19-May-1945  MRN: [id number]  Age / Sex: 77 y.o., male  PCP: Corwin Levins, MD Referring Physician: Orland Mustard, MD  Reason for Consultation: Establishing goals of care  HPI/Patient Profile: 77 y.o. male  with past medical history of ALS, HTN/HLD, depression, PEG tube placement approximately 1 year ago, admitted on 04/06/2023 with aspiration pneumonia.   Clinical Assessment and Goals of Care: I have reviewed medical records including EPIC notes, labs and imaging, received report from RN, assessed the patient.  Thomas Raymond is lying quietly in bed.  He appears acutely/chronically ill and somewhat frail.  He greets me, making and mostly keeping eye contact.  He is alert and oriented, able to make his needs known.  His wife is present at bedside.   We conduct this meeting using audio interpreter [phone].  We meet at the bedside to discuss diagnosis prognosis, GOC, EOL wishes, disposition and options.  I introduced Palliative Medicine as specialized medical care for people living with serious illness. It focuses on providing relief from the symptoms and stress of a serious illness. The goal is to improve quality of life for both the patient and the family.  We discussed a brief life review of the patient.  Thomas Raymond tells me that he arrived here from [place] in 1995.  He and his current wife been married 10 to 15 years (they are unclear about this) he is a widow, with no children.  We then focused on their current illness.  They share that Thomas Raymond has had ALS since around 2010.  We talked about the natural changes that occur with ALS, what is normal and expected.  We talked about nutritional status in particular.  Thomas Raymond had PEG tube placed approximately 1 year ago.  He shares that he will eat and drink by mouth as he is capable, but makes up  nutritional deficits via PEG tube.  The natural disease trajectory and expectations at EOL were discussed.  We talked about disposition.  Thomas Raymond and his wife shared that they feel he needs an ambulance ride home.  Thomas Raymond tells me that he is ready for discharge as soon as possible.  I encouraged him to have a good plan safely in place prior to discharge.  He states understanding and agreement.  Advanced directives, concepts specific to code status, artifical feeding and hydration, and rehospitalization were considered and discussed.  We talked about the concept of "treat the treatable, but allow a natural passing".  Both Thomas Raymond and his wife endorsed DNR.  Palliative Care services outpatient were explained and offered.  We talk about the benefit of outpatient palliative services.  Thomas Raymond and his wife readily agreed for in-home palliative services stating they would like all the help and education that they can get.  Discussed the importance of continued conversation with family and the medical providers regarding overall plan of care and treatment options, ensuring decisions are within the context of  the patient's values and GOCs.  Questions and concerns were addressed.  The family was encouraged to call with questions or concerns.  PMT will continue to support holistically.  Conference with attending, bedside nursing staff, transition of care team related to patient condition, needs, goals of care, disposition.   HCPOA NEXT OF KIN -wife,Phan Duon.  No children    SUMMARY OF RECOMMENDATIONS   At this point continue to treat the treatable but no CPR or intubation Home with [facility] palliative services Anticipate transitioning to hospice care when appropriate for increased benefits Will need ambulance ride home   Code Status/Advance Care Planning: DNR -verified with patient and wife through interpreter service  Symptom Management:  Per hospitalist, no additional needs at this  time.  Palliative Prophylaxis:  Frequent Pain Assessment, Oral Care, and Turn Reposition  Additional Recommendations (Limitations, Scope, Preferences): Continue to treat but no extraordinary measures  Psycho-social/Spiritual:  Desire for further Chaplaincy support:no Additional Recommendations: Caregiving  Support/Resources and Education on Hospice  Prognosis:  < 12 months, or less anticipated based on chronic illness burden, decreasing functional status.  Discharge Planning: Home with outpatient palliative services      Primary Diagnoses: Present on Admission:  (Resolved) Aspiration pneumonia (HCC)  Other dysphagia  Hyperlipidemia  Essential hypertension  Depression  ALS (amyotrophic lateral sclerosis) (HCC)  Acute respiratory failure with hypoxia secondary to aspiration pneumonia   I have reviewed the medical record, interviewed the patient and family, and examined the patient. The following aspects are pertinent.  Past Medical History:  Diagnosis Date   ALLERGIC RHINITIS    ALS    HYPERLIPIDEMIA    HYPERTENSION    Impaired glucose tolerance 02/17/2011   LOW BACK PAIN    VERTIGO    Social History   Socioeconomic History   Marital status: Married    Spouse name: Not on file   Number of children: 4 D   Years of education: Not on file   Highest education level: Not on file  Occupational History   Occupation: disabled  Tobacco Use   Smoking status: Never   Smokeless tobacco: Never  Substance and Sexual Activity   Alcohol use: No   Drug use: No   Sexual activity: Never  Other Topics Concern   Not on file  Social History Narrative   He lives with wife.   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  He went on disability in 2000 after diagnosis of ALS.   He moved from [place] in 1985.   Social Drivers of Corporate investment banker Strain: Not on file  Food Insecurity: No Food Insecurity (04/06/2023)   Hunger Vital Sign     Worried About Running Out of Food in the Last Year: Never true    Ran Out of Food in the Last Year: Never true  Transportation Needs: No Transportation Needs (04/06/2023)   PRAPARE - Administrator, Civil Service (Medical): No    Lack of Transportation (Non-Medical): No  Physical Activity: Not on file  Stress: Not on file  Social Connections: Not on file   Family History  Problem Relation Age of Onset   Other Mother        Deceased   Other Father        Deceased   Other Son 2       x 2 starvation/illness from [place]   Other Daughter 2       x 2 starvation/illness from refugee  camp   Scheduled Meds:  enoxaparin (LOVENOX) injection  40 mg Subcutaneous Q24H   feeding supplement (PROSource TF20)  60 mL Per Tube Daily   free water  225 mL Per Tube Q6H   Continuous Infusions:  ampicillin-sulbactam (UNASYN) IV Stopped (04/06/23 0501)   feeding supplement (OSMOLITE 1.5 CAL)     lactated ringers 100 mL/hr at 04/06/23 0926   PRN Meds:.acetaminophen **OR** acetaminophen, ondansetron **OR** ondansetron (ZOFRAN) IV Medications Prior to Admission:  Prior to Admission medications   Medication Sig Start Date End Date Taking? Authorizing Provider  gabapentin (NEURONTIN) 300 MG capsule TAKE 1 CAPSULE BY MOUTH THREE TIMES A DAY 10/04/22  Yes Corwin Levins, MD  HYDROcodone bit-homatropine (HYCODAN) 5-1.5 MG/5ML syrup Take 5 mLs by mouth every 8 (eight) hours as needed for cough. 04/02/23  Yes Moshe Cipro, FNP  omeprazole (PRILOSEC) 20 MG capsule Take 1 capsule (20 mg total) by mouth daily. 05/17/22  Yes Corwin Levins, MD  rosuvastatin (CRESTOR) 20 MG tablet TAKE 1 TABLET BY MOUTH EVERY DAY 02/26/23  Yes Corwin Levins, MD  acetaminophen (TYLENOL) 325 MG tablet Take 2 tablets (650 mg total) by mouth every 6 (six) hours as needed for mild pain or moderate pain (or temp > 100). Take via tube 04/12/22   Meredeth Ide, MD  amLODipine (NORVASC) 10 MG tablet TAKE 1 TABLET (10 MG  TOTAL) BY MOUTH DAILY. TAKE 1 TABLET (10 MG TOTAL) BY MOUTH DAILY. 08/30/22   Corwin Levins, MD  cetirizine (ZYRTEC) 10 MG tablet Take 1 tablet (10 mg total) by mouth daily as needed. Take via tube 04/12/22   Meredeth Ide, MD  citalopram (CELEXA) 10 MG tablet Take 1 tablet (10 mg total) by mouth daily. 08/06/22 08/06/23  Corwin Levins, MD  hydrochlorothiazide (MICROZIDE) 12.5 MG capsule TAKE 1 CAPSULE BY MOUTH EVERY DAY 08/30/22   Corwin Levins, MD  magic mouthwash (nystatin, lidocaine, diphenhydrAMINE, alum & mag hydroxide) suspension Swish and spit 5 mLs 4 (four) times daily. 04/02/23   Moshe Cipro, FNP  metoprolol succinate (TOPROL-XL) 50 MG 24 hr tablet TAKE 1 TABLET BY MOUTH DAILY. TAKE WITH OR IMMEDIATELY FOLLOWING A MEAL. 11/11/22   Corwin Levins, MD  Metoprolol Tartrate (FIRST - METOPROLOL) 10 MG/ML SOLN place 2.5 mls into feeding tube 2 times daily Patient not taking: Reported on 04/06/2023 06/14/22   Corwin Levins, MD  metoprolol tartrate (LOPRESSOR) 25 mg/10 mL SUSP Place 10 mLs (25 mg total) into feeding tube 2 (two) times daily. Patient not taking: Reported on 04/06/2023 04/12/22 10/02/22  Meredeth Ide, MD  triamcinolone (NASACORT) 55 MCG/ACT AERO nasal inhaler Place 2 sprays into the nose daily. 01/24/22   Corwin Levins, MD   Allergies  Allergen Reactions   Bactrim [Sulfamethoxazole-Trimethoprim] Shortness Of Breath   Review of Systems  Unable to perform ROS: Acuity of condition    Physical Exam Vitals and nursing note reviewed.  Constitutional:      General: He is not in acute distress.    Appearance: He is ill-appearing.  Cardiovascular:     Rate and Rhythm: Normal rate.  Pulmonary:     Effort: Pulmonary effort is normal. No respiratory distress.  Neurological:     Mental Status: He is alert.     Comments: Alert and oriented, able to make his needs known  Psychiatric:        Mood and Affect: Mood normal.        Behavior: Behavior normal.  Vital Signs: BP  120/69 (BP Location: Left Arm)   Pulse 77   Temp 97.7 F (36.5 C) (Oral)   Resp 20   Ht 5\' 1"  (1.549 m)   Wt 62.9 kg   SpO2 100%   BMI 26.20 kg/m  Pain Scale: 0-10   Pain Score: 0-No pain   SpO2: SpO2: 100 % O2 Device:SpO2: 100 % O2 Flow Rate: .O2 Flow Rate (L/min): 10 L/min  IO: Intake/output summary:  Intake/Output Summary (Last 24 hours) at 04/06/2023 1323 Last data filed at 04/06/2023 1155 Gross per 24 hour  Intake 986.55 ml  Output --  Net 986.55 ml    LBM: Last BM Date : 04/06/23 Baseline Weight: Weight: 62.9 kg Most recent weight: Weight: 62.9 kg     Palliative Assessment/Data:     Time In: 1100  Time Out: 1215 Time Total: 75 minutes  Greater than 50%  of this time was spent counseling and coordinating care related to the above assessment and plan.  Signed by: Katheran Awe, NP   Please contact Palliative Medicine Team phone at [phone] for questions and concerns.  For individual provider: See Loretha Stapler

## 2023-04-06 NOTE — ED Provider Notes (Addendum)
[place] EMERGENCY DEPARTMENT AT [facility] Provider Note   CSN: [id number] Arrival date & time: 04/06/23  0050     History  Chief Complaint  Patient presents with   Respiratory Distress    Thomas Raymond is a 77 y.o. male.  The history is provided by the patient and the EMS personnel. A language interpreter was used.  Thomas Raymond is a 77 y.o. male who presents to the Emergency Department complaining of difficulty breathing.  He presents to the emergency department for evaluation of difficulty breathing that started suddenly tonight.  EMS reports fire department sats of 75% on room air, improved to 80s on nonrebreather.  No associated chest pain, fevers, nausea, vomiting.  Patient reports no history of difficulty breathing.  He does have a history of ALS and has a PEG tube in place.     Home Medications Prior to Admission medications   Medication Sig Start Date End Date Taking? Authorizing Provider  acetaminophen (TYLENOL) 325 MG tablet Take 2 tablets (650 mg total) by mouth every 6 (six) hours as needed for mild pain or moderate pain (or temp > 100). Take via tube 04/12/22   Meredeth Ide, MD  amLODipine (NORVASC) 10 MG tablet TAKE 1 TABLET (10 MG TOTAL) BY MOUTH DAILY. TAKE 1 TABLET (10 MG TOTAL) BY MOUTH DAILY. 08/30/22   Corwin Levins, MD  cetirizine (ZYRTEC) 10 MG tablet Take 1 tablet (10 mg total) by mouth daily as needed. Take via tube 04/12/22   Meredeth Ide, MD  citalopram (CELEXA) 10 MG tablet Take 1 tablet (10 mg total) by mouth daily. 08/06/22 08/06/23  Corwin Levins, MD  gabapentin (NEURONTIN) 250 MG/5ML solution Place 6 mLs (300 mg total) into feeding tube 3 (three) times daily. 05/17/22   Corwin Levins, MD  gabapentin (NEURONTIN) 300 MG capsule TAKE 1 CAPSULE BY MOUTH THREE TIMES A DAY 10/04/22   Corwin Levins, MD  hydrochlorothiazide (MICROZIDE) 12.5 MG capsule TAKE 1 CAPSULE BY MOUTH EVERY DAY 08/30/22   Corwin Levins, MD  HYDROcodone bit-homatropine (HYCODAN) 5-1.5  MG/5ML syrup Take 5 mLs by mouth every 8 (eight) hours as needed for cough. 04/02/23   Moshe Cipro, FNP  loperamide HCl (IMODIUM) 1 MG/7.5ML suspension Place 15 mLs (2 mg total) into feeding tube every 6 (six) hours as needed for diarrhea or loose stools. 04/12/22   Meredeth Ide, MD  magic mouthwash (nystatin, lidocaine, diphenhydrAMINE, alum & mag hydroxide) suspension Swish and spit 5 mLs 4 (four) times daily. 04/02/23   Moshe Cipro, FNP  metoprolol succinate (TOPROL-XL) 50 MG 24 hr tablet TAKE 1 TABLET BY MOUTH DAILY. TAKE WITH OR IMMEDIATELY FOLLOWING A MEAL. 11/11/22   Corwin Levins, MD  Metoprolol Tartrate (FIRST - METOPROLOL) 10 MG/ML SOLN place 2.5 mls into feeding tube 2 times daily 06/14/22   Corwin Levins, MD  metoprolol tartrate (LOPRESSOR) 25 mg/10 mL SUSP Place 10 mLs (25 mg total) into feeding tube 2 (two) times daily. 04/12/22 10/02/22  Meredeth Ide, MD  omeprazole (PRILOSEC) 20 MG capsule Take 1 capsule (20 mg total) by mouth daily. 05/17/22   Corwin Levins, MD  rosuvastatin (CRESTOR) 20 MG tablet TAKE 1 TABLET BY MOUTH EVERY DAY 02/26/23   Corwin Levins, MD  triamcinolone (NASACORT) 55 MCG/ACT AERO nasal inhaler Place 2 sprays into the nose daily. 01/24/22   Corwin Levins, MD      Allergies    Bactrim [sulfamethoxazole-trimethoprim]  Review of Systems   Review of Systems  All other systems reviewed and are negative.   Physical Exam Updated Vital Signs BP 129/79   Pulse 100   Temp 98.2 F (36.8 C) (Oral)   Resp (!) 27   Ht 5\' 1"  (1.549 m)   Wt 62.9 kg   SpO2 99%   BMI 26.20 kg/m  Physical Exam Vitals and nursing note reviewed.  Constitutional:      Appearance: He is well-developed.  HENT:     Head: Normocephalic and atraumatic.  Cardiovascular:     Rate and Rhythm: Regular rhythm. Tachycardia present.     Heart sounds: No murmur heard. Pulmonary:     Comments: Tachypnea.  Decreased air movement bilaterally.  Occasional wheezes  bilaterally Abdominal:     Palpations: Abdomen is soft.     Tenderness: There is no abdominal tenderness. There is no guarding or rebound.     Comments: PEG tube in left upper quadrant  Musculoskeletal:        General: No tenderness.  Skin:    General: Skin is warm and dry.  Neurological:     Mental Status: He is alert and oriented to person, place, and time.  Psychiatric:        Behavior: Behavior normal.     ED Results / Procedures / Treatments   Labs (all labs ordered are listed, but only abnormal results are displayed) Labs Reviewed  D-DIMER, QUANTITATIVE - Abnormal; Notable for the following components:      Result Value   D-Dimer, Quant 1.22 (*)    All other components within normal limits  CBC WITH DIFFERENTIAL/PLATELET - Abnormal; Notable for the following components:   WBC 22.4 (*)    Neutro Abs 18.4 (*)    Monocytes Absolute 1.8 (*)    Abs Immature Granulocytes 0.15 (*)    All other components within normal limits  COMPREHENSIVE METABOLIC PANEL - Abnormal; Notable for the following components:   Glucose, Bld 119 (*)    Creatinine, Ser 0.44 (*)    Calcium 8.8 (*)    Albumin 2.6 (*)    All other components within normal limits  I-STAT CHEM 8, ED - Abnormal; Notable for the following components:   Creatinine, Ser 0.50 (*)    Glucose, Bld 119 (*)    All other components within normal limits  I-STAT VENOUS BLOOD GAS, ED - Abnormal; Notable for the following components:   pO2, Ven 28 (*)    Bicarbonate 33.9 (*)    TCO2 36 (*)    Acid-Base Excess 7.0 (*)    All other components within normal limits  TROPONIN I (HIGH SENSITIVITY) - Abnormal; Notable for the following components:   Troponin I (High Sensitivity) 46 (*)    All other components within normal limits  RESP PANEL BY RT-PCR (RSV, FLU A&B, COVID)  RVPGX2  CULTURE, BLOOD (ROUTINE X 2)  CULTURE, BLOOD (ROUTINE X 2)  CBC WITH DIFFERENTIAL/PLATELET  I-STAT CG4 LACTIC ACID, ED  TROPONIN I (HIGH SENSITIVITY)     EKG None  Radiology CT Angio Chest PE W/Cm &/Or Wo Cm Result Date: 04/06/2023 CLINICAL DATA:  Respiratory distress and positive D-dimer. EXAM: CT ANGIOGRAPHY CHEST WITH CONTRAST TECHNIQUE: Multidetector CT imaging of the chest was performed using the standard protocol during bolus administration of intravenous contrast. Multiplanar CT image reconstructions and MIPs were obtained to evaluate the vascular anatomy. RADIATION DOSE REDUCTION: This exam was performed according to the departmental dose-optimization program which includes automated exposure  control, adjustment of the mA and/or kV according to patient size and/or use of iterative reconstruction technique. CONTRAST:  75mL OMNIPAQUE IOHEXOL 350 MG/ML SOLN COMPARISON:  Portable chest today, AP Lat chest 02/16/2023, PA Lat chest 09/09/2022, and chest CTs with contrast 04/07/2022 and 10/19/2015. FINDINGS: Cardiovascular: There is mild cardiomegaly. No pericardial effusion. Scattered three-vessel coronary artery calcifications. Mild-to-moderate calcific plaques are noted in the aorta with mild calcifications of the great vessels. There is no aneurysm, stenosis or dissection. There is mild aortic tortuosity. Pulmonary arteries are normal in caliber without evidence of arterial emboli. The pulmonary veins are nondistended. Mediastinum/Nodes: There are calcified bilateral hilar and mediastinal lymph nodes. Stable prominence of and azygoesophageal recess lymph node measuring 11 mm short axis on 4:66. No other significant lymph nodes. The lower poles of the thyroid gland the thoracic esophagus are unremarkable. There is scattered layering fluid in the distal trachea. Lungs/Pleura: Similar to the previous exam there is fluid and debris opacifying the right lower lobe bronchus with complete right lower lobe collapse/consolidation. There is consolidation of the majority of the right middle lobe as well, with relative peripheral sparing. In the right upper lobe  there are thickened bronchi with the segmental bronchi showing plugging with fluid or mucoid material. The lungs are mildly emphysematous with centrilobular changes predominating in the upper lobes, biapical pleural-parenchymal scarring. Subpleural reticulation appears similar in the anterior right upper lobe and there is a calcified granuloma in the medial right apex. Scattered airspace disease in the posterior segment of the right upper lobe is probably due to bronchopneumonia. Left lung is clear of infiltrates. There is a minimal layering right and no left pleural effusion. Right hemidiaphragm is chronically elevated to the level of the mid hilum consistent with eventration or paresis. Upper Abdomen: No acute abnormality.  Status post cholecystectomy. Musculoskeletal: No chest wall abnormality. No acute or significant osseous findings. Review of the MIP images confirms the above findings. IMPRESSION: 1. No evidence of arterial emboli. 2. Fluid and debris opacifying the right lower lobe bronchus with complete right lower lobe collapse/consolidation; consolidation of the majority of the right middle lobe with relative peripheral sparing. Similar findings were noted on the 2 prior CTs. Findings suggest chronic aspiration. 3. Thickened right upper lobe bronchi with segmental bronchial plugging with fluid or mucoid material. 4. Scattered airspace disease in the posterior segment of the right upper lobe probably due to bronchopneumonia. 5. Minimal layering right pleural effusion. 6. Emphysema. 7. Aortic and coronary artery atherosclerosis. 8. Chronically elevated right hemidiaphragm consistent with eventration or paresis. 9. Old granulomatous disease. Aortic Atherosclerosis (ICD10-I70.0) and Emphysema (ICD10-J43.9). Electronically Signed   By: Almira Bar M.D.   On: 04/06/2023 03:05   DG Chest Portable 1 View Result Date: 04/06/2023 CLINICAL DATA:  Dyspnea EXAM: PORTABLE CHEST 1 VIEW COMPARISON:  02/16/2023  FINDINGS: There is near complete collapse of the right lower and middle lobes with right-sided volume loss and elevation of right hemidiaphragm. This appears similar to more remote prior examination of 04/08/2022. Left lung is clear. No pneumothorax or pleural effusion. Cardiac size within normal limits. Pulmonary vascularity is normal. No acute bone abnormality. IMPRESSION: 1. Near complete collapse of the right lower and middle lobes with right-sided volume loss and elevation of right hemidiaphragm. This appears similar to more remote prior examination of 04/08/2022. Correlation for a central obstructing lesion such as a mucous plug or aspirated material may be helpful. Electronically Signed   By: Helyn Numbers M.D.   On: 04/06/2023 01:14  Procedures Procedures  CRITICAL CARE Performed by: Tilden Fossa   Total critical care time: 35 minutes  Critical care time was exclusive of separately billable procedures and treating other patients.  Critical care was necessary to treat or prevent imminent or life-threatening deterioration.  Critical care was time spent personally by me on the following activities: development of treatment plan with patient and/or surrogate as well as nursing, discussions with consultants, evaluation of patient's response to treatment, examination of patient, obtaining history from patient or surrogate, ordering and performing treatments and interventions, ordering and review of laboratory studies, ordering and review of radiographic studies, pulse oximetry and re-evaluation of patient's condition.   Medications Ordered in ED Medications  lactated ringers infusion ( Intravenous New Bag/Given 04/06/23 0252)  ipratropium-albuterol (DUONEB) 0.5-2.5 (3) MG/3ML nebulizer solution 3 mL (3 mLs Nebulization Given 04/06/23 0132)  piperacillin-tazobactam (ZOSYN) IVPB 3.375 g (0 g Intravenous Stopped 04/06/23 0249)  lactated ringers bolus 500 mL (0 mLs Intravenous Stopped  04/06/23 0251)  acetaminophen (TYLENOL) suppository 650 mg (650 mg Rectal Given 04/06/23 0159)  iohexol (OMNIPAQUE) 350 MG/ML injection 75 mL (75 mLs Intravenous Contrast Given 04/06/23 0230)    ED Course/ Medical Decision Making/ A&P                                 Medical Decision Making Amount and/or Complexity of Data Reviewed Labs: ordered. Radiology: ordered.  Risk OTC drugs. Prescription drug management. Decision regarding hospitalization.   Patient with history of ALS here for evaluation of difficulty breathing that began abruptly tonight.  Patient with new oxygen requirement in the emergency department to maintain sats in the low 90s.  He does have some decreased air movement in the lung bases.  He was treated with albuterol, high flow oxygen.  Chest x-ray without definite infiltrate, similar when compared to priors-images personally reviewed and interpreted, agree with radiologist interpretation.  He was found to be febrile in the emergency department with leukocytosis, he was started on empiric antibiotics for likely aspiration pneumonia.  Lactic acid did return within normal limits.  CTA was obtained, which demonstrates likely aspiration as well as lobar collapse secondary to mucoid impaction.  Plan to admit for ongoing care.  Patient and family member at bedside updated Darlis Loan studies and recommendation for admission and they are in agreement with treatment plan.   Troponin is mildly elevated, no active chest pain, presentation is not consistent with ACS.     Final Clinical Impression(s) / ED Diagnoses Final diagnoses:  Aspiration pneumonia of right lung due to gastric secretions, unspecified part of lung [facility])    Rx / DC Orders ED Discharge Orders     None         Tilden Fossa, MD 04/06/23 1610    Tilden Fossa, MD 04/06/23 [phone]

## 2023-04-06 NOTE — Assessment & Plan Note (Signed)
Presenting with leukocytosis, tachycardia and tachypnea with respiratory failure in setting of aspiration pneumonia -given bolus and IVF In ED -BC obtained -lactic acid wnl  -continue unasyn -sputum cx ordered -PCT 4.20>trend

## 2023-04-06 NOTE — Progress Notes (Signed)
  Carryover admission to the Day Admitter.  I discussed this case with the EDP, Dr. Madilyn Hook.  Per these discussions:   This is a 77 year old male with ALS, who is from [place] and speaks [place]mer, who is being admitted with aspiration pneumonia after presenting with shortness of breath starting earlier today.  EMS was contacted in this capacity, and noted the patient's oxygen saturations to be in the 70s.  He has been on 10 L heated high flow in the ED, with oxygen saturations in the mid 90s.   Labs notable for white blood cell count of 22,000.  CTA chest showed no evidence of acute pulmonary embolism, but showed evidence of aspiration pneumonia.  Blood cultures x 2 were collected and he was started on Zosyn.  I have placed an order for inpatient admission to the PCU for further evaluation management of the above.  I have placed some additional preliminary admit orders via the adult multi-morbid admission order set. I have also ordered continuation of Zosyn, n.p.o, magnesium, phosphorus, procalcitonin levels.  Have also placed a consult with the plan of care team for assistance with clarification of goals of care.    Thomas Pigg, DO Hospitalist

## 2023-04-06 NOTE — Progress Notes (Signed)
Initial Nutrition Assessment  DOCUMENTATION CODES:   Not applicable  INTERVENTION:  Initiate tube feeding via PEG: Osmolite 1.5  at 55 ml/h (1320 ml per day) Start continuous feeding @ 68ml/hr advance rate by 10ml every 4 hours till goal of 55ml/hr met.  Prosource TF20 60 ml daily FWF every 6 hours Provides 2060 kcal, 103 gm protein, 1905 ml free water daily    NUTRITION DIAGNOSIS:   Increased nutrient needs related to chronic illness as evidenced by estimated needs.    GOAL:   Patient will meet greater than or equal to 90% of their needs    MONITOR:   TF tolerance  REASON FOR ASSESSMENT:   Consult Enteral/tube feeding initiation and management  ASSESSMENT:    77 y.o. M who is from [place] and speaks Khmer, presented to ED, via EMS with complaints of. Difficulty breathing. Admitted with aspiration pneumonia. PMH; HTN, Vertigo, HLD, ALS. With PEG placed. Remote assessment. All information obtained through EMR and Team. PEG - jejunostomy.  Admitted with pneumonia.   Admit weight: 62.9 kg Current weight: 62.9 kg     Average Meal Intake: NPO EN dependent  Reviewed for Nutritionally Relevant Medications:  Labs Reviewed: Phos; 1.8    NUTRITION - FOCUSED PHYSICAL EXAM:  Deferred   Diet Order:   Diet Order             Diet NPO time specified  Diet effective now                   EDUCATION NEEDS:      Skin:  Skin Assessment: Skin Integrity Issues: Skin Integrity Issues:: Stage I Stage I: Sacrum  Last BM:  12/15  Height:   Ht Readings from Last 1 Encounters:  04/06/23 5\' 1"  (1.549 m)    Weight:   Wt Readings from Last 1 Encounters:  04/06/23 62.9 kg    Ideal Body Weight:     BMI:  Body mass index is 26.2 kg/m.  Estimated Nutritional Needs:   Kcal:  1895 - 2220 kcal/d  Protein:  85-95 g/d  Fluid:  8ml/kcal    Jamelle Haring RDN, LDN Clinical Dietitian  Pleas see Amion for contact information

## 2023-04-06 NOTE — TOC Progression Note (Addendum)
Transition of Care [facility]) - Progression Note    Patient Details  Name: Thomas Raymond MRN: [id number] Date of Birth: [birth year]  Transition [facility] Care [facility]) CM/SW Contact  Ronny Bacon, RN Phone Number: 04/06/2023, 2:15 PM  Clinical Narrative:   Secure chat received from Palliative care provider that patient wants to go home with palliative care services through [facility]. Secure message sent to [facility] liaison to inform of same. TOC will follow for Home care needs.         Expected Discharge Plan and Services                                               Social Determinants of Health (SDOH) Interventions SDOH Screenings   Food Insecurity: No Food Insecurity (04/06/2023)  Housing: Unknown (04/06/2023)  Transportation Needs: No Transportation Needs (04/06/2023)  Utilities: Not At Risk (04/06/2023)  Depression (PHQ2-9): Low Risk  (02/11/2023)  Tobacco Use: Low Risk  (04/06/2023)    Readmission Risk Interventions     No data to display

## 2023-04-06 NOTE — Assessment & Plan Note (Signed)
Palliative care consult In power wheel chair Chronic aspiration>PEG tube Is a DNR

## 2023-04-06 NOTE — Plan of Care (Signed)
  Problem: Health Behavior/Discharge Planning: Goal: Ability to manage health-related needs will improve Outcome: Progressing   Problem: Clinical Measurements: Goal: Ability to maintain clinical measurements within normal limits will improve Outcome: Progressing Goal: Will remain free from infection Outcome: Progressing Goal: Diagnostic test results will improve Outcome: Progressing Goal: Respiratory complications will improve Outcome: Progressing Goal: Cardiovascular complication will be avoided Outcome: Progressing   Problem: Nutrition: Goal: Adequate nutrition will be maintained Outcome: Progressing   

## 2023-04-06 NOTE — ED Triage Notes (Signed)
Pt BIB [facility] from home d/t Resp distress.  Family called - when EMS arrived he was 75 % on RA - they placed him on NRB and he was 86%.  After sitting him up he was 91% O2.  Pt is diminished and wheezy.

## 2023-04-06 NOTE — Assessment & Plan Note (Signed)
Secondary to ALS  NPO, SLP eval Palliative care eval  Aspiration precautions

## 2023-04-07 DIAGNOSIS — G1221 Amyotrophic lateral sclerosis: Secondary | ICD-10-CM | POA: Diagnosis not present

## 2023-04-07 DIAGNOSIS — R1319 Other dysphagia: Secondary | ICD-10-CM

## 2023-04-07 DIAGNOSIS — J690 Pneumonitis due to inhalation of food and vomit: Secondary | ICD-10-CM

## 2023-04-07 DIAGNOSIS — J9601 Acute respiratory failure with hypoxia: Secondary | ICD-10-CM | POA: Diagnosis not present

## 2023-04-07 DIAGNOSIS — I10 Essential (primary) hypertension: Secondary | ICD-10-CM

## 2023-04-07 LAB — CBC
HCT: 34.9 % — ABNORMAL LOW (ref 39.0–52.0)
Hemoglobin: 11 g/dL — ABNORMAL LOW (ref 13.0–17.0)
MCH: 29 pg (ref 26.0–34.0)
MCHC: 31.5 g/dL (ref 30.0–36.0)
MCV: 92.1 fL (ref 80.0–100.0)
Platelets: 225 10*3/uL (ref 150–400)
RBC: 3.79 MIL/uL — ABNORMAL LOW (ref 4.22–5.81)
RDW: 14.4 % (ref 11.5–15.5)
WBC: 10.2 10*3/uL (ref 4.0–10.5)
nRBC: 0 % (ref 0.0–0.2)

## 2023-04-07 LAB — BASIC METABOLIC PANEL
Anion gap: 5 (ref 5–15)
BUN: 11 mg/dL (ref 8–23)
CO2: 29 mmol/L (ref 22–32)
Calcium: 8.1 mg/dL — ABNORMAL LOW (ref 8.9–10.3)
Chloride: 104 mmol/L (ref 98–111)
Creatinine, Ser: 0.31 mg/dL — ABNORMAL LOW (ref 0.61–1.24)
GFR, Estimated: >60 mL/min (ref >60)
Glucose, Bld: 180 mg/dL — ABNORMAL HIGH (ref 70–99)
Potassium: 4 mmol/L (ref 3.5–5.1)
Sodium: 138 mmol/L (ref 135–145)

## 2023-04-07 LAB — PHOSPHORUS: Phosphorus: 2.2 mg/dL — ABNORMAL LOW (ref 2.5–4.6)

## 2023-04-07 LAB — MAGNESIUM: Magnesium: 1.8 mg/dL (ref 1.7–2.4)

## 2023-04-07 LAB — GLUCOSE, CAPILLARY
Glucose-Capillary: 168 mg/dL — ABNORMAL HIGH (ref 70–99)
Glucose-Capillary: 173 mg/dL — ABNORMAL HIGH (ref 70–99)
Glucose-Capillary: 177 mg/dL — ABNORMAL HIGH (ref 70–99)

## 2023-04-07 MED ORDER — AMOXICILLIN-POT CLAVULANATE 400-57 MG/5ML PO SUSR
800.0000 mg | Freq: Two times a day (BID) | ORAL | Status: DC
  Administered 2023-04-08: 800 mg
  Filled 2023-04-07 (×2): qty 10

## 2023-04-07 MED ORDER — AMLODIPINE BESYLATE 10 MG PO TABS
10.0000 mg | ORAL_TABLET | Freq: Every day | ORAL | Status: DC
  Administered 2023-04-07 – 2023-04-08 (×2): 10 mg
  Filled 2023-04-07 (×2): qty 1

## 2023-04-07 NOTE — Progress Notes (Addendum)
PROGRESS NOTE    [place]  RUE:[id number] DOB: October 11, 1945 DOA: 04/06/2023 PCP: Corwin Levins, MD  Subjective: Pt seen and examined. Wife at bedside. Used Archivist. [name] language.  Discussed that pt can be discharge to home tomorrow. Pt has been seen by palliative care.  Will get CM to assist in getting home oral suctioning machine for patient.  Home O2 evaluation.  Will need ambulance ride home.   Hospital Course: HPI: 31 Thomas Raymond is a 77 y.o. male with medical history significant of ALS, HTN, HLD, depression who presented to ED with shortness of breath and cough x 1 week. Cough is productive. He denies any fevers/chills. No sick contacts. Saw PCP on 12/11 and was given a hydrocodone syrup for his PEG tube. He denies any swelling in his legs. His wife reports some confusion prior to ED, but is at baseline now.    He has a PEG tube. He has been eating porridge and drinking through a straw. He uses his PEG tube 1x/day. He uses it for some medication. Wife sometimes will also give nourishment through this.    Denies any fever/chills, vision changes/headaches, chest pain or palpitations, shortness of breath or cough, abdominal pain, N/V/D, dysuria or leg swelling.    He does not smoke or drink alcohol.   Significant Events: Admitted 04/06/2023 for acute respiratory failure with hypoxia, aspiration pneumonia   Significant Labs: Admission wbc: 22.4, d-dimer: 1.22, troponin 46>40, PCT: 4.20   Significant Imaging Studies: Admission CXR: Near complete collapse of the right lower and middle lobes with right-sided volume loss and elevation of right hemidiaphragm. Admission CTPA showed No PE. Fluid and debris opacifying the right lower lobe bronchus with complete right lower lobe collapse/consolidation; consolidation of the majority of the right middle lobe with relative peripheral sparing. Similar findings were noted on the 2 prior CTs. Findings suggest chronic aspiration. 3.  Thickened right upper lobe bronchi with segmental bronchial plugging with fluid or mucoid material. 4. Scattered airspace disease in the posterior segment of the right upper lobe probably due to bronchopneumonia. 5. Minimal layering right pleural effusion. 6. Emphysema. 7. Aortic and coronary artery atherosclerosis. 8. Chronically elevated right hemidiaphragm consistent with eventration or paresis. 9. Old granulomatous disease.  Antibiotic Therapy: Anti-infectives (From admission, onward)    Start     Dose/Rate Route Frequency Ordered Stop   04/06/23 0400  ampicillin-sulbactam (UNASYN) 1.5 g in sodium chloride 0.9 % 100 mL IVPB        1.5 g 200 mL/hr over 30 Minutes Intravenous Every 6 hours 04/06/23 0405     04/06/23 0145  piperacillin-tazobactam (ZOSYN) IVPB 3.375 g        3.375 g 100 mL/hr over 30 Minutes Intravenous  Once 04/06/23 0139 04/06/23 0249       Procedures:   Consultants: Palliative Care    Assessment and Plan: * Acute respiratory failure with hypoxia secondary to aspiration pneumonia On admission, 77 year old male with history of ALS and chronic aspiration who presented to ED with shortness of breath and noted to have oxygen in the 70%s in the field with EMS and placed on HFNC. CTA chest with imaging consistent with chronic aspiration.  -admit to progressive  -continue unasyn -sputum culture pending  -SLP to eval -NPO, nutrition consult for tube feedings  -palliative care consult -aspiration precautions -wean as tolerated   04-07-2023 seen by palliative care. Pt made DNR/DNI. Allow for comfort feedings. Will arrange home suction machine. Change to liquid augmentin via  PEG tomorrow.  Sepsis secondary to aspiration pneumonia Present On admission, Presenting with leukocytosis, tachycardia and tachypnea with respiratory failure in setting of aspiration pneumonia -given bolus and IVF In ED -BC obtained -lactic acid wnl  -continue unasyn -sputum cx ordered -PCT  4.20>trend  04-07-2023 pt seen by palliative care. Pt made DNR/DNI. Pt and wife goal is to get home ASAP.  Continue IV unasyn today. Change to liquid augmentin tomorrow. Wean O2 to RA. Plan for DC in AM.  Other dysphagia On admission, Secondary to ALS. NPO, SLP eval, Palliative care eval, Aspiration precautions   04-07-2023 pt may be on comfort feedings at home.  ALS (amyotrophic lateral sclerosis) (HCC) On admission, Palliative care consult. In power wheel chair. Chronic aspiration>PEG tube. Is a DNR   04-07-2023 chronic. Life limiting disease.  Pressure injury of skin-resolved as of 04/06/2023 Wound care consult   Essential hypertension On Admission, Blood pressure soft to well controlled. Hold meds until [facility] completed. IV prn until this time   04-07-2023 will DC BP meds at discharge.  Depression Stable.  Hyperlipidemia Continue crestor per tube feeds    DVT prophylaxis: enoxaparin (LOVENOX) injection 40 mg Start: 04/06/23 2200 SCDs Start: 04/06/23 0355    Code Status: Limited: Do not attempt resuscitation (DNR) -DNR-LIMITED -Do Not Intubate/DNI  Family Communication: discussed with pt and wife via video interpretor Disposition Plan: return home Reason for continuing need for hospitalization: continue IV ABX. Plan for DC in AM.  Objective: Vitals:   04/07/23 0241 04/07/23 0430 04/07/23 0916 04/07/23 1406  BP:   124/68 (!) 154/61  Pulse:   82 89  Resp:   20 18  Temp:  98.5 F (36.9 C) 97.9 F (36.6 C) 98 F (36.7 C)  TempSrc:  Oral Oral Oral  SpO2:  98% 100% 98%  Weight: 63.8 kg     Height:        Intake/Output Summary (Last 24 hours) at 04/07/2023 1410 Last data filed at 04/07/2023 1408 Gross per 24 hour  Intake 1068.23 ml  Output 2575 ml  Net -1506.77 ml   Filed Weights   04/06/23 0107 04/07/23 0241  Weight: 62.9 kg 63.8 kg    Examination:  Physical Exam Vitals and nursing note reviewed.  Constitutional:      Comments: Chronically ill appearing  male  HENT:     Head: Normocephalic and atraumatic.  Cardiovascular:     Rate and Rhythm: Normal rate and regular rhythm.  Pulmonary:     Effort: Pulmonary effort is normal.     Comments: Using yankauer to suction saliva out of his own mouth Abdominal:     General: Bowel sounds are normal.     Palpations: Abdomen is soft.     Comments: +PEG  Skin:    General: Skin is warm and dry.  Neurological:     Mental Status: He is oriented to person, place, and time.     Comments: Able to speak to video interpretor.     Data Reviewed: I have personally reviewed following labs and imaging studies  CBC: Recent Labs  Lab 04/06/23 0124 04/06/23 0125 04/07/23 0522  WBC  --  22.4* 10.2  NEUTROABS  --  18.4*  --   HGB 15.3 14.0  15.0 11.0*  HCT 45.0 44.2  44.0 34.9*  MCV  --  90.6 92.1  PLT  --  258 225   Basic Metabolic Panel: Recent Labs  Lab 04/06/23 0124 04/06/23 0125 04/06/23 0135 04/06/23 1230 04/06/23 1706 04/07/23  0522  NA 140 135  140  --   --   --  138  K 3.9 3.8  3.9  --   --   --  4.0  CL 101 100  --   --   --  104  CO2  --  30  --   --   --  29  GLUCOSE 119* 119*  --   --   --  180*  BUN 19 16  --   --   --  11  CREATININE 0.50* 0.44*  --   --   --  0.31*  CALCIUM  --  8.8*  --   --   --  8.1*  MG  --   --  1.9 1.8 1.8 1.8  PHOS  --   --  1.8* 2.5 2.6 2.2*   GFR: Estimated Creatinine Clearance: 61.2 mL/min (A) (by C-G formula based on SCr of 0.31 mg/dL (L)). Liver Function Tests: Recent Labs  Lab 04/06/23 0125  AST 19  ALT 12  ALKPHOS 83  BILITOT 0.6  PROT 7.1  ALBUMIN 2.6*   BNP (last 3 results) Recent Labs    02/16/23 0541  BNP 37.4   CBG: Recent Labs  Lab 04/06/23 1957 04/07/23 0000 04/07/23 0432  GLUCAP 170* 177* 168*   Sepsis Labs: Recent Labs  Lab 04/06/23 0125 04/06/23 0135  PROCALCITON  --  4.20  LATICACIDVEN 1.6  --     Recent Results (from the past 240 hours)  Culture, blood (routine x 2)     Status: None  (Preliminary result)   Collection Time: 04/06/23  1:23 AM   Specimen: BLOOD  Result Value Ref Range Status   Specimen Description BLOOD SITE NOT SPECIFIED  Final   Special Requests   Final    BOTTLES DRAWN AEROBIC AND ANAEROBIC Blood Culture adequate volume   Culture   Final    NO GROWTH 1 DAY Performed at [facility], [address] [place], [place] [address]    Report Status PENDING  Incomplete  Culture, blood (routine x 2)     Status: None (Preliminary result)   Collection Time: 04/06/23  1:23 AM   Specimen: BLOOD  Result Value Ref Range Status   Specimen Description BLOOD SITE NOT SPECIFIED  Final   Special Requests   Final    BOTTLES DRAWN AEROBIC AND ANAEROBIC Blood Culture adequate volume   Culture   Final    NO GROWTH 1 DAY Performed at [facility], [address], [place], [place] [address]    Report Status PENDING  Incomplete  Resp panel by RT-PCR (RSV, Flu A&B, Covid) Anterior Nasal Swab     Status: None   Collection Time: 04/06/23  1:39 AM   Specimen: Anterior Nasal Swab  Result Value Ref Range Status   SARS Coronavirus 2 by RT PCR NEGATIVE NEGATIVE Final   Influenza A by PCR NEGATIVE NEGATIVE Final   Influenza B by PCR NEGATIVE NEGATIVE Final    Comment: (NOTE) The Xpert Xpress SARS-CoV-2/FLU/RSV plus assay is intended as an aid in the diagnosis of influenza from Nasopharyngeal swab specimens and should not be used as a sole basis for treatment. Nasal washings and aspirates are unacceptable for Xpert Xpress SARS-CoV-2/FLU/RSV testing.  Fact Sheet for Patients: BloggerCourse.com  Fact Sheet for Healthcare Providers: SeriousBroker.it  This test is not yet approved or cleared by the [place] FDA and has been authorized for detection and/or diagnosis of  SARS-CoV-2 by FDA under an Emergency Use Authorization (EUA). This EUA will remain in effect (meaning this test can be used) for the duration  of the COVID-19 declaration under Section 564(b)(1) of the Act, 21 U.S.C. section 360bbb-3(b)(1), unless the authorization is terminated or revoked.     Resp Syncytial Virus by PCR NEGATIVE NEGATIVE Final    Comment: (NOTE) Fact Sheet for Patients: BloggerCourse.com  Fact Sheet for Healthcare Providers: SeriousBroker.it  This test is not yet approved or cleared by the [place] FDA and has been authorized for detection and/or diagnosis of SARS-CoV-2 by FDA under an Emergency Use Authorization (EUA). This EUA will remain in effect (meaning this test can be used) for the duration of the COVID-19 declaration under Section 564(b)(1) of the Act, 21 U.S.C. section 360bbb-3(b)(1), unless the authorization is terminated or revoked.  Performed at [facility], [address], [place], [place] [address]   Expectorated Sputum Assessment w Gram Stain, Rflx to Resp Cult     Status: None   Collection Time: 04/06/23  8:00 AM   Specimen: Sputum  Result Value Ref Range Status   Specimen Description SPUTUM  Final   Special Requests NONE  Final   Sputum evaluation   Final    Sputum specimen not acceptable for testing.  Please recollect.   Performed at [facility], [address], [place], [place] [address]    Report Status 04/06/2023 FINAL  Final     Radiology Studies: CT Angio Chest PE W/Cm &/Or Wo Cm Result Date: 04/06/2023 CLINICAL DATA:  Respiratory distress and positive D-dimer. EXAM: CT ANGIOGRAPHY CHEST WITH CONTRAST TECHNIQUE: Multidetector CT imaging of the chest was performed using the standard protocol during bolus administration of intravenous contrast. Multiplanar CT image reconstructions and MIPs were obtained to evaluate the vascular anatomy. RADIATION DOSE REDUCTION: This exam was performed according to the departmental dose-optimization program which includes automated exposure control, adjustment of the mA  and/or kV according to patient size and/or use of iterative reconstruction technique. CONTRAST:  75mL OMNIPAQUE IOHEXOL 350 MG/ML SOLN COMPARISON:  Portable chest today, AP Lat chest 02/16/2023, PA Lat chest 09/09/2022, and chest CTs with contrast 04/07/2022 and 10/19/2015. FINDINGS: Cardiovascular: There is mild cardiomegaly. No pericardial effusion. Scattered three-vessel coronary artery calcifications. Mild-to-moderate calcific plaques are noted in the aorta with mild calcifications of the great vessels. There is no aneurysm, stenosis or dissection. There is mild aortic tortuosity. Pulmonary arteries are normal in caliber without evidence of arterial emboli. The pulmonary veins are nondistended. Mediastinum/Nodes: There are calcified bilateral hilar and mediastinal lymph nodes. Stable prominence of and azygoesophageal recess lymph node measuring 11 mm short axis on 4:66. No other significant lymph nodes. The lower poles of the thyroid gland the thoracic esophagus are unremarkable. There is scattered layering fluid in the distal trachea. Lungs/Pleura: Similar to the previous exam there is fluid and debris opacifying the right lower lobe bronchus with complete right lower lobe collapse/consolidation. There is consolidation of the majority of the right middle lobe as well, with relative peripheral sparing. In the right upper lobe there are thickened bronchi with the segmental bronchi showing plugging with fluid or mucoid material. The lungs are mildly emphysematous with centrilobular changes predominating in the upper lobes, biapical pleural-parenchymal scarring. Subpleural reticulation appears similar in the anterior right upper lobe and there is a calcified granuloma in the medial right apex. Scattered airspace disease in the posterior segment of the right upper lobe is probably due to bronchopneumonia. Left  lung is clear of infiltrates. There is a minimal layering right and no left pleural effusion. Right  hemidiaphragm is chronically elevated to the level of the mid hilum consistent with eventration or paresis. Upper Abdomen: No acute abnormality.  Status post cholecystectomy. Musculoskeletal: No chest wall abnormality. No acute or significant osseous findings. Review of the MIP images confirms the above findings. IMPRESSION: 1. No evidence of arterial emboli. 2. Fluid and debris opacifying the right lower lobe bronchus with complete right lower lobe collapse/consolidation; consolidation of the majority of the right middle lobe with relative peripheral sparing. Similar findings were noted on the 2 prior CTs. Findings suggest chronic aspiration. 3. Thickened right upper lobe bronchi with segmental bronchial plugging with fluid or mucoid material. 4. Scattered airspace disease in the posterior segment of the right upper lobe probably due to bronchopneumonia. 5. Minimal layering right pleural effusion. 6. Emphysema. 7. Aortic and coronary artery atherosclerosis. 8. Chronically elevated right hemidiaphragm consistent with eventration or paresis. 9. Old granulomatous disease. Aortic Atherosclerosis (ICD10-I70.0) and Emphysema (ICD10-J43.9). Electronically Signed   By: Almira Bar M.D.   On: 04/06/2023 03:05   DG Chest Portable 1 View Result Date: 04/06/2023 CLINICAL DATA:  Dyspnea EXAM: PORTABLE CHEST 1 VIEW COMPARISON:  02/16/2023 FINDINGS: There is near complete collapse of the right lower and middle lobes with right-sided volume loss and elevation of right hemidiaphragm. This appears similar to more remote prior examination of 04/08/2022. Left lung is clear. No pneumothorax or pleural effusion. Cardiac size within normal limits. Pulmonary vascularity is normal. No acute bone abnormality. IMPRESSION: 1. Near complete collapse of the right lower and middle lobes with right-sided volume loss and elevation of right hemidiaphragm. This appears similar to more remote prior examination of 04/08/2022. Correlation for a  central obstructing lesion such as a mucous plug or aspirated material may be helpful. Electronically Signed   By: Helyn Numbers M.D.   On: 04/06/2023 01:14    Scheduled Meds:  [START ON 04/08/2023] amoxicillin-clavulanate  800 mg Per Tube Q12H   enoxaparin (LOVENOX) injection  40 mg Subcutaneous Q24H   feeding supplement (PROSource TF20)  60 mL Per Tube Daily   free water  225 mL Per Tube Q6H   Continuous Infusions:  ampicillin-sulbactam (UNASYN) IV 1.5 g (04/07/23 1244)   feeding supplement (OSMOLITE 1.5 CAL) 1,000 mL (04/07/23 0835)     LOS: 1 day   Time spent: 40 minutes  Carollee Herter, DO  [facility]  04/07/2023, 2:10 PM

## 2023-04-07 NOTE — Hospital Course (Addendum)
HPI: Thomas Raymond is a 77 y.o. male with medical history significant of ALS, HTN, HLD, depression who presented to ED with shortness of breath and cough x 1 week. Cough is productive. He denies any fevers/chills. No sick contacts. Saw PCP on 12/11 and was given a hydrocodone syrup for his PEG tube. He denies any swelling in his legs. His wife reports some confusion prior to ED, but is at baseline now.    He has a PEG tube. He has been eating porridge and drinking through a straw. He uses his PEG tube 1x/day. He uses it for some medication. Wife sometimes will also give nourishment through this.    Denies any fever/chills, vision changes/headaches, chest pain or palpitations, shortness of breath or cough, abdominal pain, N/V/D, dysuria or leg swelling.    He does not smoke or drink alcohol.   Significant Events: Admitted 04/06/2023 for acute respiratory failure with hypoxia, aspiration pneumonia   Significant Labs: Admission wbc: 22.4, d-dimer: 1.22, troponin 46>40, PCT: 4.20   Significant Imaging Studies: Admission CXR: Near complete collapse of the right lower and middle lobes with right-sided volume loss and elevation of right hemidiaphragm. Admission CTPA showed No PE. Fluid and debris opacifying the right lower lobe bronchus with complete right lower lobe collapse/consolidation; consolidation of the majority of the right middle lobe with relative peripheral sparing. Similar findings were noted on the 2 prior CTs. Findings suggest chronic aspiration. 3. Thickened right upper lobe bronchi with segmental bronchial plugging with fluid or mucoid material. 4. Scattered airspace disease in the posterior segment of the right upper lobe probably due to bronchopneumonia. 5. Minimal layering right pleural effusion. 6. Emphysema. 7. Aortic and coronary artery atherosclerosis. 8. Chronically elevated right hemidiaphragm consistent with eventration or paresis. 9. Old granulomatous disease.  Antibiotic  Therapy: Anti-infectives (From admission, onward)    Start     Dose/Rate Route Frequency Ordered Stop   04/06/23 0400  ampicillin-sulbactam (UNASYN) 1.5 g in sodium chloride 0.9 % 100 mL IVPB        1.5 g 200 mL/hr over 30 Minutes Intravenous Every 6 hours 04/06/23 0405     04/06/23 0145  piperacillin-tazobactam (ZOSYN) IVPB 3.375 g        3.375 g 100 mL/hr over 30 Minutes Intravenous  Once 04/06/23 0139 04/06/23 0249       Procedures:   Consultants: Palliative Care

## 2023-04-07 NOTE — Progress Notes (Signed)
Palliative: Chart review completed.  At this point the goal is for Thomas Raymond to return to his own home tomorrow via ambulance with the benefits of [facility] outpatient palliative services.  Plan: At this point continue to treat the treatable but no CPR or intubation.  Home with the benefits of [facility] palliative services.  No charge Lillia Carmel, NP Palliative medicine team Team phone [phone]

## 2023-04-07 NOTE — Progress Notes (Signed)
Pulse oximetry on room air is 87. Pt is on 2L NACL sitting at 95

## 2023-04-07 NOTE — Subjective & Objective (Addendum)
Pt seen and examined. No Wife at bedside. Will need ambulance ride home. Stable for DC Procal decreasing. No fevers. Pt qualifies for home O2 @ 2 L/min.

## 2023-04-08 ENCOUNTER — Other Ambulatory Visit (HOSPITAL_COMMUNITY): Payer: Self-pay

## 2023-04-08 DIAGNOSIS — G1221 Amyotrophic lateral sclerosis: Secondary | ICD-10-CM | POA: Diagnosis not present

## 2023-04-08 DIAGNOSIS — I10 Essential (primary) hypertension: Secondary | ICD-10-CM | POA: Diagnosis not present

## 2023-04-08 DIAGNOSIS — J690 Pneumonitis due to inhalation of food and vomit: Secondary | ICD-10-CM | POA: Diagnosis not present

## 2023-04-08 DIAGNOSIS — Z66 Do not resuscitate: Secondary | ICD-10-CM | POA: Insufficient documentation

## 2023-04-08 DIAGNOSIS — L89151 Pressure ulcer of sacral region, stage 1: Secondary | ICD-10-CM | POA: Insufficient documentation

## 2023-04-08 DIAGNOSIS — J9601 Acute respiratory failure with hypoxia: Secondary | ICD-10-CM | POA: Diagnosis not present

## 2023-04-08 LAB — COMPREHENSIVE METABOLIC PANEL
ALT: 51 U/L — ABNORMAL HIGH (ref 0–44)
AST: 121 U/L — ABNORMAL HIGH (ref 15–41)
Albumin: 1.9 g/dL — ABNORMAL LOW (ref 3.5–5.0)
Alkaline Phosphatase: 81 U/L (ref 38–126)
Anion gap: 4 — ABNORMAL LOW (ref 5–15)
BUN: 13 mg/dL (ref 8–23)
CO2: 28 mmol/L (ref 22–32)
Calcium: 8.4 mg/dL — ABNORMAL LOW (ref 8.9–10.3)
Chloride: 107 mmol/L (ref 98–111)
Creatinine, Ser: 0.3 mg/dL — ABNORMAL LOW (ref 0.61–1.24)
GFR, Estimated: >60 mL/min (ref >60)
Glucose, Bld: 170 mg/dL — ABNORMAL HIGH (ref 70–99)
Potassium: 4.2 mmol/L (ref 3.5–5.1)
Sodium: 139 mmol/L (ref 135–145)
Total Bilirubin: 0.3 mg/dL (ref <1.2)
Total Protein: 5.7 g/dL — ABNORMAL LOW (ref 6.5–8.1)

## 2023-04-08 LAB — GLUCOSE, CAPILLARY
Glucose-Capillary: 121 mg/dL — ABNORMAL HIGH (ref 70–99)
Glucose-Capillary: 131 mg/dL — ABNORMAL HIGH (ref 70–99)
Glucose-Capillary: 158 mg/dL — ABNORMAL HIGH (ref 70–99)
Glucose-Capillary: 159 mg/dL — ABNORMAL HIGH (ref 70–99)
Glucose-Capillary: 97 mg/dL (ref 70–99)

## 2023-04-08 LAB — CBC WITH DIFFERENTIAL/PLATELET
Abs Immature Granulocytes: 0.02 10*3/uL (ref 0.00–0.07)
Basophils Absolute: 0 10*3/uL (ref 0.0–0.1)
Basophils Relative: 0 %
Eosinophils Absolute: 0 10*3/uL (ref 0.0–0.5)
Eosinophils Relative: 0 %
HCT: 34.3 % — ABNORMAL LOW (ref 39.0–52.0)
Hemoglobin: 10.9 g/dL — ABNORMAL LOW (ref 13.0–17.0)
Immature Granulocytes: 0 %
Lymphocytes Relative: 13 %
Lymphs Abs: 1.1 10*3/uL (ref 0.7–4.0)
MCH: 29.1 pg (ref 26.0–34.0)
MCHC: 31.8 g/dL (ref 30.0–36.0)
MCV: 91.5 fL (ref 80.0–100.0)
Monocytes Absolute: 0.9 10*3/uL (ref 0.1–1.0)
Monocytes Relative: 10 %
Neutro Abs: 6.2 10*3/uL (ref 1.7–7.7)
Neutrophils Relative %: 77 %
Platelets: 248 10*3/uL (ref 150–400)
RBC: 3.75 MIL/uL — ABNORMAL LOW (ref 4.22–5.81)
RDW: 14.4 % (ref 11.5–15.5)
WBC: 8.3 10*3/uL (ref 4.0–10.5)
nRBC: 0 % (ref 0.0–0.2)

## 2023-04-08 LAB — PROCALCITONIN: Procalcitonin: 1.39 ng/mL

## 2023-04-08 MED ORDER — AMOXICILLIN-POT CLAVULANATE 400-57 MG/5ML PO SUSR
800.0000 mg | Freq: Two times a day (BID) | ORAL | 0 refills | Status: AC
  Filled 2023-04-08: qty 200, 10d supply, fill #0

## 2023-04-08 NOTE — Assessment & Plan Note (Signed)
04-08-2023 present on admission.

## 2023-04-08 NOTE — Progress Notes (Signed)
PROGRESS NOTE    [place]  GUY:[id number] DOB: May 22, 1945 DOA: 04/06/2023 PCP: Corwin Levins, MD  Subjective: Pt seen and examined. No Wife at bedside. Will need ambulance ride home. Stable for DC Procal decreasing. No fevers. Pt qualifies for home O2 @ 2 L/min.   Hospital Course: HPI: 69 [place] is a 77 y.o. male with medical history significant of ALS, HTN, HLD, depression who presented to ED with shortness of breath and cough x 1 week. Cough is productive. He denies any fevers/chills. No sick contacts. Saw PCP on 12/11 and was given a hydrocodone syrup for his PEG tube. He denies any swelling in his legs. His wife reports some confusion prior to ED, but is at baseline now.    He has a PEG tube. He has been eating porridge and drinking through a straw. He uses his PEG tube 1x/day. He uses it for some medication. Wife sometimes will also give nourishment through this.    Denies any fever/chills, vision changes/headaches, chest pain or palpitations, shortness of breath or cough, abdominal pain, N/V/D, dysuria or leg swelling.    He does not smoke or drink alcohol.   Significant Events: Admitted 04/06/2023 for acute respiratory failure with hypoxia, aspiration pneumonia   Significant Labs: Admission wbc: 22.4, d-dimer: 1.22, troponin 46>40, PCT: 4.20   Significant Imaging Studies: Admission CXR: Near complete collapse of the right lower and middle lobes with right-sided volume loss and elevation of right hemidiaphragm. Admission CTPA showed No PE. Fluid and debris opacifying the right lower lobe bronchus with complete right lower lobe collapse/consolidation; consolidation of the majority of the right middle lobe with relative peripheral sparing. Similar findings were noted on the 2 prior CTs. Findings suggest chronic aspiration. 3. Thickened right upper lobe bronchi with segmental bronchial plugging with fluid or mucoid material. 4. Scattered airspace disease in the posterior segment  of the right upper lobe probably due to bronchopneumonia. 5. Minimal layering right pleural effusion. 6. Emphysema. 7. Aortic and coronary artery atherosclerosis. 8. Chronically elevated right hemidiaphragm consistent with eventration or paresis. 9. Old granulomatous disease.  Antibiotic Therapy: Anti-infectives (From admission, onward)    Start     Dose/Rate Route Frequency Ordered Stop   04/06/23 0400  ampicillin-sulbactam (UNASYN) 1.5 g in sodium chloride 0.9 % 100 mL IVPB        1.5 g 200 mL/hr over 30 Minutes Intravenous Every 6 hours 04/06/23 0405     04/06/23 0145  piperacillin-tazobactam (ZOSYN) IVPB 3.375 g        3.375 g 100 mL/hr over 30 Minutes Intravenous  Once 04/06/23 0139 04/06/23 0249       Procedures:   Consultants: Palliative Care    Assessment and Plan: * Acute respiratory failure with hypoxia secondary to aspiration pneumonia On admission, 77 year old male with history of ALS and chronic aspiration who presented to ED with shortness of breath and noted to have oxygen in the 70%s in the field with EMS and placed on HFNC. CTA chest with imaging consistent with chronic aspiration.  -admit to progressive  -continue unasyn -sputum culture pending  -SLP to eval -NPO, nutrition consult for tube feedings  -palliative care consult -aspiration precautions -wean as tolerated   04-07-2023 seen by palliative care. Pt made DNR/DNI. Allow for comfort feedings. Will arrange home suction machine. Change to liquid augmentin via PEG tomorrow.  04-08-2023 pt qualified for home O2. "Pulse oximetry on room air is 87. Pt is on 2L NACL sitting at 95 ".  This is documented by RN on 04-07-2023 @ 7:09 PM.  Will DC to home on 2 L/min  Sepsis secondary to aspiration pneumonia Present On admission, Presenting with leukocytosis, tachycardia and tachypnea with respiratory failure in setting of aspiration pneumonia -given bolus and IVF In ED -BC obtained -lactic acid wnl  -continue  unasyn -sputum cx ordered -PCT 4.20>trend  04-07-2023 pt seen by palliative care. Pt made DNR/DNI. Pt and wife goal is to get home ASAP.  Continue IV unasyn today. Change to liquid augmentin tomorrow. Wean O2 to RA. Plan for DC in AM.  04-08-2023 DC to home with liquid augmentin via PEG.  Other dysphagia On admission, Secondary to ALS. NPO, SLP eval, Palliative care eval, Aspiration precautions   04-07-2023 pt may be on comfort feedings at home.  04-08-2023 have arranged for home oral suctioning machine due to his dysphagia and ALS.  ALS (amyotrophic lateral sclerosis) (HCC) On admission, Palliative care consult. In power wheel chair. Chronic aspiration>PEG tube. Is a DNR   04-07-2023 chronic. Life limiting disease. 04-08-2023 have arranged for home oral suctioning machine due to his dysphagia and ALS.  Pressure injury of skin-resolved as of 04/06/2023 Wound care consult   Essential hypertension On Admission, Blood pressure soft to well controlled. Hold meds until [facility] completed. IV prn until this time   04-07-2023 will DC BP meds at discharge.  Depression Stable.  Hyperlipidemia Continue crestor per tube feeds        DVT prophylaxis: enoxaparin (LOVENOX) injection 40 mg Start: 04/06/23 2200 SCDs Start: 04/06/23 0355     Code Status: Limited: Do not attempt resuscitation (DNR) -DNR-LIMITED -Do Not Intubate/DNI  Family Communication: no family at bedside Disposition Plan: return home Reason for continuing need for hospitalization: stable for DC. Overall prognosis is poor.  Objective: Vitals:   04/07/23 2311 04/08/23 0153 04/08/23 0412 04/08/23 0853  BP: 121/60  (!) 114/57   Pulse: 88  86   Resp: 18  18 18   Temp: 97.8 F (36.6 C)  97.8 F (36.6 C) 97.8 F (36.6 C)  TempSrc: Oral  Oral Oral  SpO2: 95%  92%   Weight:  65.7 kg    Height:        Intake/Output Summary (Last 24 hours) at 04/08/2023 0910 Last data filed at 04/08/2023 0900 Gross per 24 hour   Intake --  Output 3475 ml  Net -3475 ml   Filed Weights   04/06/23 0107 04/07/23 0241 04/08/23 0153  Weight: 62.9 kg 63.8 kg 65.7 kg    Examination:  Physical Exam Vitals and nursing note reviewed.  Constitutional:      General: He is not in acute distress.    Appearance: He is not toxic-appearing.     Comments: Appears chronically ill  HENT:     Head: Normocephalic.  Pulmonary:     Effort: Pulmonary effort is normal. No respiratory distress.  Skin:    General: Skin is warm and dry.  Neurological:     Mental Status: He is alert.     Data Reviewed: I have personally reviewed following labs and imaging studies  CBC: Recent Labs  Lab 04/06/23 0124 04/06/23 0125 04/07/23 0522 04/08/23 0323  WBC  --  22.4* 10.2 8.3  NEUTROABS  --  18.4*  --  6.2  HGB 15.3 14.0  15.0 11.0* 10.9*  HCT 45.0 44.2  44.0 34.9* 34.3*  MCV  --  90.6 92.1 91.5  PLT  --  258 225 248   Basic Metabolic  Panel: Recent Labs  Lab 04/06/23 0124 04/06/23 0125 04/06/23 0135 04/06/23 1230 04/06/23 1706 04/07/23 0522 04/08/23 0323  NA 140 135  140  --   --   --  138 139  K 3.9 3.8  3.9  --   --   --  4.0 4.2  CL 101 100  --   --   --  104 107  CO2  --  30  --   --   --  29 28  GLUCOSE 119* 119*  --   --   --  180* 170*  BUN 19 16  --   --   --  11 13  CREATININE 0.50* 0.44*  --   --   --  0.31* 0.30*  CALCIUM  --  8.8*  --   --   --  8.1* 8.4*  MG  --   --  1.9 1.8 1.8 1.8  --   PHOS  --   --  1.8* 2.5 2.6 2.2*  --    GFR: Estimated Creatinine Clearance: 62.1 mL/min (A) (by C-G formula based on SCr of 0.3 mg/dL (L)). Liver Function Tests: Recent Labs  Lab 04/06/23 0125 04/08/23 0323  AST 19 121*  ALT 12 51*  ALKPHOS 83 81  BILITOT 0.6 0.3  PROT 7.1 5.7*  ALBUMIN 2.6* 1.9*    BNP (last 3 results) Recent Labs    02/16/23 0541  BNP 37.4   CBG: Recent Labs  Lab 04/07/23 0432 04/07/23 2007 04/07/23 2358 04/08/23 0423 04/08/23 0854  GLUCAP 168* 173* 159* 158* 131*    Sepsis Labs: Recent Labs  Lab 04/06/23 0125 04/06/23 0135 04/08/23 0323  PROCALCITON  --  4.20 1.39  LATICACIDVEN 1.6  --   --     Recent Results (from the past 240 hours)  Culture, blood (routine x 2)     Status: None (Preliminary result)   Collection Time: 04/06/23  1:23 AM   Specimen: BLOOD  Result Value Ref Range Status   Specimen Description BLOOD SITE NOT SPECIFIED  Final   Special Requests   Final    BOTTLES DRAWN AEROBIC AND ANAEROBIC Blood Culture adequate volume   Culture   Final    NO GROWTH 2 DAYS Performed at [facility], [address], [place], [place] [address]    Report Status PENDING  Incomplete  Culture, blood (routine x 2)     Status: None (Preliminary result)   Collection Time: 04/06/23  1:23 AM   Specimen: BLOOD  Result Value Ref Range Status   Specimen Description BLOOD SITE NOT SPECIFIED  Final   Special Requests   Final    BOTTLES DRAWN AEROBIC AND ANAEROBIC Blood Culture adequate volume   Culture   Final    NO GROWTH 2 DAYS Performed at [facility], [address], [place], [place] [address]    Report Status PENDING  Incomplete  Resp panel by RT-PCR (RSV, Flu A&B, Covid) Anterior Nasal Swab     Status: None   Collection Time: 04/06/23  1:39 AM   Specimen: Anterior Nasal Swab  Result Value Ref Range Status   SARS Coronavirus 2 by RT PCR NEGATIVE NEGATIVE Final   Influenza A by PCR NEGATIVE NEGATIVE Final   Influenza B by PCR NEGATIVE NEGATIVE Final    Comment: (NOTE) The Xpert Xpress SARS-CoV-2/FLU/RSV plus assay is intended as an aid in the diagnosis of influenza from Nasopharyngeal swab specimens and should not be used  as a sole basis for treatment. Nasal washings and aspirates are unacceptable for Xpert Xpress SARS-CoV-2/FLU/RSV testing.  Fact Sheet for Patients: BloggerCourse.com  Fact Sheet for Healthcare Providers: SeriousBroker.it  This test is not yet approved or  cleared by the [place] FDA and has been authorized for detection and/or diagnosis of SARS-CoV-2 by FDA under an Emergency Use Authorization (EUA). This EUA will remain in effect (meaning this test can be used) for the duration of the COVID-19 declaration under Section 564(b)(1) of the Act, 21 U.S.C. section 360bbb-3(b)(1), unless the authorization is terminated or revoked.     Resp Syncytial Virus by PCR NEGATIVE NEGATIVE Final    Comment: (NOTE) Fact Sheet for Patients: BloggerCourse.com  Fact Sheet for Healthcare Providers: SeriousBroker.it  This test is not yet approved or cleared by the [place] FDA and has been authorized for detection and/or diagnosis of SARS-CoV-2 by FDA under an Emergency Use Authorization (EUA). This EUA will remain in effect (meaning this test can be used) for the duration of the COVID-19 declaration under Section 564(b)(1) of the Act, 21 U.S.C. section 360bbb-3(b)(1), unless the authorization is terminated or revoked.  Performed at [facility], [address] [place], [place] [address]   Expectorated Sputum Assessment w Gram Stain, Rflx to Resp Cult     Status: None   Collection Time: 04/06/23  8:00 AM   Specimen: Sputum  Result Value Ref Range Status   Specimen Description SPUTUM  Final   Special Requests NONE  Final   Sputum evaluation   Final    Sputum specimen not acceptable for testing.  Please recollect.   Performed at [facility], [address] [place], [place] [address]    Report Status 04/06/2023 FINAL  Final     Radiology Studies: No results found.  Scheduled Meds:  amLODipine  10 mg Per Tube Daily   amoxicillin-clavulanate  800 mg Per Tube Q12H   enoxaparin (LOVENOX) injection  40 mg Subcutaneous Q24H   feeding supplement (PROSource TF20)  60 mL Per Tube Daily   free water  225 mL Per Tube Q6H   Continuous Infusions:  feeding supplement (OSMOLITE 1.5  CAL) 1,000 mL (04/08/23 0149)     LOS: 2 days   Time spent: 40 minutes  Carollee Herter, DO  [facility]  04/08/2023, 9:10 AM

## 2023-04-08 NOTE — Discharge Summary (Addendum)
[facility] Physician Discharge Summary   Patient name: Thomas Raymond  Admit date:     04/06/2023  Discharge date: 04/08/2023  Attending Physician: Angie Fava [[id number]]  Discharge Physician: Carollee Herter   PCP: Corwin Levins, MD  Admitted From: Home  Disposition:  Home  Recommendations for Outpatient Follow-up:  Follow up with PCP in 1-2 weeks  Home Health:Yes Equipment/Devices: Home oral suctioning machine Oxygen 2 L/min  Discharge Condition:Stable CODE STATUS:DNR/DNI Diet recommendation:  comfort feedings only Fluid Restriction: None  Hospital Summary: HPI: Thomas Raymond is a 77 y.o. male with medical history significant of ALS, HTN, HLD, depression who presented to ED with shortness of breath and cough x 1 week. Cough is productive. He denies any fevers/chills. No sick contacts. Saw PCP on 12/11 and was given a hydrocodone syrup for his PEG tube. He denies any swelling in his legs. His wife reports some confusion prior to ED, but is at baseline now.    He has a PEG tube. He has been eating porridge and drinking through a straw. He uses his PEG tube 1x/day. He uses it for some medication. Wife sometimes will also give nourishment through this.    Denies any fever/chills, vision changes/headaches, chest pain or palpitations, shortness of breath or cough, abdominal pain, N/V/D, dysuria or leg swelling.    He does not smoke or drink alcohol.   Significant Events: Admitted 04/06/2023 for acute respiratory failure with hypoxia, aspiration pneumonia   Significant Labs: Admission wbc: 22.4, d-dimer: 1.22, troponin 46>40, PCT: 4.20   Significant Imaging Studies: Admission CXR: Near complete collapse of the right lower and middle lobes with right-sided volume loss and elevation of right hemidiaphragm. Admission CTPA showed No PE. Fluid and debris opacifying the right lower lobe bronchus with complete right lower lobe collapse/consolidation; consolidation of the majority of  the right middle lobe with relative peripheral sparing. Similar findings were noted on the 2 prior CTs. Findings suggest chronic aspiration. 3. Thickened right upper lobe bronchi with segmental bronchial plugging with fluid or mucoid material. 4. Scattered airspace disease in the posterior segment of the right upper lobe probably due to bronchopneumonia. 5. Minimal layering right pleural effusion. 6. Emphysema. 7. Aortic and coronary artery atherosclerosis. 8. Chronically elevated right hemidiaphragm consistent with eventration or paresis. 9. Old granulomatous disease.  Antibiotic Therapy: Anti-infectives (From admission, onward)    Start     Dose/Rate Route Frequency Ordered Stop   04/06/23 0400  ampicillin-sulbactam (UNASYN) 1.5 g in sodium chloride 0.9 % 100 mL IVPB        1.5 g 200 mL/hr over 30 Minutes Intravenous Every 6 hours 04/06/23 0405     04/06/23 0145  piperacillin-tazobactam (ZOSYN) IVPB 3.375 g        3.375 g 100 mL/hr over 30 Minutes Intravenous  Once 04/06/23 0139 04/06/23 0249       Procedures:   Consultants: Palliative Care   Hospital Course by Problem: * Acute respiratory failure with hypoxia secondary to aspiration pneumonia On admission, 77 year old male with history of ALS and chronic aspiration who presented to ED with shortness of breath and noted to have oxygen in the 70%s in the field with EMS and placed on HFNC. CTA chest with imaging consistent with chronic aspiration.  -admit to progressive  -continue unasyn -sputum culture pending  -SLP to eval -NPO, nutrition consult for tube feedings  -palliative care consult -aspiration precautions -wean as tolerated   04-07-2023 seen by palliative care. Pt made DNR/DNI.  Allow for comfort feedings. Will arrange home suction machine. Change to liquid augmentin via PEG tomorrow.  04-08-2023 pt qualified for home O2. "Pulse oximetry on room air is 87. Pt is on 2L NACL sitting at 95 ". This is documented by RN on  04-07-2023 @ 7:09 PM.  Will DC to home on 2 L/min  Sepsis secondary to aspiration pneumonia Present On admission, Presenting with leukocytosis, tachycardia and tachypnea with respiratory failure in setting of aspiration pneumonia -given bolus and IVF In ED -BC obtained -lactic acid wnl  -continue unasyn -sputum cx ordered -PCT 4.20>trend  04-07-2023 pt seen by palliative care. Pt made DNR/DNI. Pt and wife goal is to get home ASAP.  Continue IV unasyn today. Change to liquid augmentin tomorrow. Wean O2 to RA. Plan for DC in AM.  04-08-2023 DC to home with liquid augmentin via PEG.  Other dysphagia On admission, Secondary to ALS. NPO, SLP eval, Palliative care eval, Aspiration precautions   04-07-2023 pt may be on comfort feedings at home.  04-08-2023 have arranged for home oral suctioning machine due to his dysphagia and ALS.  ALS (amyotrophic lateral sclerosis) (HCC) On admission, Palliative care consult. In power wheel chair. Chronic aspiration>PEG tube. Is a DNR   04-07-2023 chronic. Life limiting disease. 04-08-2023 have arranged for home oral suctioning machine due to his dysphagia and ALS.  Pressure injury of skin-resolved as of 04/06/2023 Wound care consult   Essential hypertension On Admission, Blood pressure soft to well controlled. Hold meds until [facility] completed. IV prn until this time   04-07-2023 will DC BP meds at discharge.  Depression Stable.  Hyperlipidemia Continue crestor per tube feeds   Pressure injury of sacral region, stage 1 04-08-2023 present on admission.  DNR (do not resuscitate)/DNI(Do Not Intubate)     Discharge Diagnoses:  Principal Problem:   Acute respiratory failure with hypoxia secondary to aspiration pneumonia Active Problems:   Sepsis secondary to aspiration pneumonia   Other dysphagia   ALS (amyotrophic lateral sclerosis) (HCC)   Essential hypertension   Depression   Hyperlipidemia   DNR (do not resuscitate)/DNI(Do Not  Intubate)   Pressure injury of sacral region, stage 1   Discharge Instructions  Discharge Instructions     Call MD for:  extreme fatigue   Complete by: As directed    Call MD for:  persistant dizziness or light-headedness   Complete by: As directed    Call MD for:  persistant nausea and vomiting   Complete by: As directed    Call MD for:  severe uncontrolled pain   Complete by: As directed    Call MD for:  temperature >100.4   Complete by: As directed    Diet - low sodium heart healthy   Complete by: As directed    Discharge instructions   Complete by: As directed    1. Follow up with primary care provider in 1-2 weeks following discharge from hospital   Increase activity slowly   Complete by: As directed    No wound care   Complete by: As directed       Allergies as of 04/08/2023       Reactions   Bactrim [sulfamethoxazole-trimethoprim] Shortness Of Breath        Medication List     STOP taking these medications    HYDROcodone bit-homatropine 5-1.5 MG/5ML syrup Commonly known as: HYCODAN   metoprolol tartrate 25 mg/10 mL Susp Commonly known as: LOPRESSOR       TAKE these medications  acetaminophen 325 MG tablet Commonly known as: TYLENOL Take 2 tablets (650 mg total) by mouth every 6 (six) hours as needed for mild pain or moderate pain (or temp > 100). Take via tube   amLODipine 10 MG tablet Commonly known as: NORVASC TAKE 1 TABLET (10 MG TOTAL) BY MOUTH DAILY. TAKE 1 TABLET (10 MG TOTAL) BY MOUTH DAILY.   amoxicillin-clavulanate 400-57 MG/5ML suspension Commonly known as: AUGMENTIN Place 10 mLs (800 mg total) into feeding tube every 12 (twelve) hours for 7 days. Discard remainder.   gabapentin 300 MG capsule Commonly known as: NEURONTIN TAKE 1 CAPSULE BY MOUTH THREE TIMES A DAY   omeprazole 20 MG capsule Commonly known as: PRILOSEC Take 1 capsule (20 mg total) by mouth daily.   rosuvastatin 20 MG tablet Commonly known as: CRESTOR TAKE 1  TABLET BY MOUTH EVERY DAY   triamcinolone 55 MCG/ACT Aero nasal inhaler Commonly known as: NASACORT Place 2 sprays into the nose daily. What changed:  when to take this reasons to take this               Durable Medical Equipment  (From admission, onward)           Start     Ordered   04/08/23 0737  For home use only DME oxygen  Once       Question Answer Comment  Length of Need Lifetime   Mode or (Route) Nasal cannula   Liters per Minute 2   Frequency Continuous (stationary and portable oxygen unit needed)   Oxygen conserving device Yes   Oxygen delivery system Gas      04/08/23 0736   04/07/23 1257  For home use only DME Other see comment  Once       Comments: Home oral suction machine. Pt with hx of ALS. Has known dysphagia. Already has PEG tube. Is at high risk for aspiration and aspiration pneumonia due to inability to handle oral secretions.  Question:  Length of Need  Answer:  Lifetime   04/07/23 1257            Follow-up Information     Corwin Levins, MD. Schedule an appointment as soon as possible for a visit in 2 week(s).   Specialties: Internal Medicine, Radiology Why: make an appointment for 2 weeks follow up post discharge Contact information: [address] [place] [address] [phone]         [facility] Follow up.   Why: referral for outpt palliative care- they will contact you Contact information: [address] [facility] [address] [phone]        [facility] [place] [facility] Follow up.   Why: ([facility])- home 02 and suction arranged- to be delivered to the home- they will contact you with timeframe Contact information: [address] [place] [address] [phone]                Allergies  Allergen Reactions   Bactrim [Sulfamethoxazole-Trimethoprim] Shortness Of Breath    Discharge Exam: Vitals:   04/08/23 0853 04/08/23 1216  BP:  (!) 146/76  Pulse:  (!) 109  Resp: 18    Temp: 97.8 F (36.6 C) (!) 97.4 F (36.3 C)  SpO2:  92%    Physical Exam Vitals and nursing note reviewed.  Constitutional:      General: He is not in acute distress.    Appearance: He is not toxic-appearing.     Comments: Appears chronically ill  HENT:  Head: Normocephalic.  Pulmonary:     Effort: Pulmonary effort is normal. No respiratory distress.  Skin:    General: Skin is warm and dry.  Neurological:     Mental Status: He is alert.     The results of significant diagnostics from this hospitalization (including imaging, microbiology, ancillary and laboratory) are listed below for reference.    Microbiology: Recent Results (from the past 240 hours)  Culture, blood (routine x 2)     Status: None (Preliminary result)   Collection Time: 04/06/23  1:23 AM   Specimen: BLOOD  Result Value Ref Range Status   Specimen Description BLOOD SITE NOT SPECIFIED  Final   Special Requests   Final    BOTTLES DRAWN AEROBIC AND ANAEROBIC Blood Culture adequate volume   Culture   Final    NO GROWTH 2 DAYS Performed at [facility], [address], [place], [place] [address]    Report Status PENDING  Incomplete  Culture, blood (routine x 2)     Status: None (Preliminary result)   Collection Time: 04/06/23  1:23 AM   Specimen: BLOOD  Result Value Ref Range Status   Specimen Description BLOOD SITE NOT SPECIFIED  Final   Special Requests   Final    BOTTLES DRAWN AEROBIC AND ANAEROBIC Blood Culture adequate volume   Culture   Final    NO GROWTH 2 DAYS Performed at [facility], [address], [place], [place] [address]    Report Status PENDING  Incomplete  Resp panel by RT-PCR (RSV, Flu A&B, Covid) Anterior Nasal Swab     Status: None   Collection Time: 04/06/23  1:39 AM   Specimen: Anterior Nasal Swab  Result Value Ref Range Status   SARS Coronavirus 2 by RT PCR NEGATIVE NEGATIVE Final   Influenza A by PCR NEGATIVE NEGATIVE Final   Influenza B by PCR NEGATIVE  NEGATIVE Final    Comment: (NOTE) The Xpert Xpress SARS-CoV-2/FLU/RSV plus assay is intended as an aid in the diagnosis of influenza from Nasopharyngeal swab specimens and should not be used as a sole basis for treatment. Nasal washings and aspirates are unacceptable for Xpert Xpress SARS-CoV-2/FLU/RSV testing.  Fact Sheet for Patients: BloggerCourse.com  Fact Sheet for Healthcare Providers: SeriousBroker.it  This test is not yet approved or cleared by the [place] FDA and has been authorized for detection and/or diagnosis of SARS-CoV-2 by FDA under an Emergency Use Authorization (EUA). This EUA will remain in effect (meaning this test can be used) for the duration of the COVID-19 declaration under Section 564(b)(1) of the Act, 21 U.S.C. section 360bbb-3(b)(1), unless the authorization is terminated or revoked.     Resp Syncytial Virus by PCR NEGATIVE NEGATIVE Final    Comment: (NOTE) Fact Sheet for Patients: BloggerCourse.com  Fact Sheet for Healthcare Providers: SeriousBroker.it  This test is not yet approved or cleared by the [place] FDA and has been authorized for detection and/or diagnosis of SARS-CoV-2 by FDA under an Emergency Use Authorization (EUA). This EUA will remain in effect (meaning this test can be used) for the duration of the COVID-19 declaration under Section 564(b)(1) of the Act, 21 U.S.C. section 360bbb-3(b)(1), unless the authorization is terminated or revoked.  Performed at [facility], [address], [place], [place] [address]   Expectorated Sputum Assessment w Gram Stain, Rflx to Resp Cult     Status: None   Collection Time: 04/06/23  8:00 AM   Specimen: Sputum  Result Value Ref Range Status   Specimen Description SPUTUM  Final   Special Requests NONE  Final   Sputum evaluation   Final    Sputum specimen not acceptable for  testing.  Please recollect.   Performed at [facility], [address] [place], [place] [address]    Report Status 04/06/2023 FINAL  Final     Labs: BNP (last 3 results) Recent Labs    02/16/23 0541  BNP 37.4   Basic Metabolic Panel: Recent Labs  Lab 04/06/23 0124 04/06/23 0125 04/06/23 0135 04/06/23 1230 04/06/23 1706 04/07/23 0522 04/08/23 0323  NA 140 135  140  --   --   --  138 139  K 3.9 3.8  3.9  --   --   --  4.0 4.2  CL 101 100  --   --   --  104 107  CO2  --  30  --   --   --  29 28  GLUCOSE 119* 119*  --   --   --  180* 170*  BUN 19 16  --   --   --  11 13  CREATININE 0.50* 0.44*  --   --   --  0.31* 0.30*  CALCIUM  --  8.8*  --   --   --  8.1* 8.4*  MG  --   --  1.9 1.8 1.8 1.8  --   PHOS  --   --  1.8* 2.5 2.6 2.2*  --    Liver Function Tests: Recent Labs  Lab 04/06/23 0125 04/08/23 0323  AST 19 121*  ALT 12 51*  ALKPHOS 83 81  BILITOT 0.6 0.3  PROT 7.1 5.7*  ALBUMIN 2.6* 1.9*   CBC: Recent Labs  Lab 04/06/23 0124 04/06/23 0125 04/07/23 0522 04/08/23 0323  WBC  --  22.4* 10.2 8.3  NEUTROABS  --  18.4*  --  6.2  HGB 15.3 14.0  15.0 11.0* 10.9*  HCT 45.0 44.2  44.0 34.9* 34.3*  MCV  --  90.6 92.1 91.5  PLT  --  258 225 248   CBG: Recent Labs  Lab 04/07/23 2007 04/07/23 2358 04/08/23 0423 04/08/23 0854 04/08/23 1216  GLUCAP 173* 159* 158* 131* 97   D-Dimer Recent Labs    04/06/23 0125  DDIMER 1.22*   Sepsis Labs Recent Labs  Lab 04/06/23 0125 04/07/23 0522 04/08/23 0323  WBC 22.4* 10.2 8.3   Microbiology Recent Results (from the past 240 hours)  Culture, blood (routine x 2)     Status: None (Preliminary result)   Collection Time: 04/06/23  1:23 AM   Specimen: BLOOD  Result Value Ref Range Status   Specimen Description BLOOD SITE NOT SPECIFIED  Final   Special Requests   Final    BOTTLES DRAWN AEROBIC AND ANAEROBIC Blood Culture adequate volume   Culture   Final    NO GROWTH 2 DAYS Performed at [facility], [address], [place], [place] [address]    Report Status PENDING  Incomplete  Culture, blood (routine x 2)     Status: None (Preliminary result)   Collection Time: 04/06/23  1:23 AM   Specimen: BLOOD  Result Value Ref Range Status   Specimen Description BLOOD SITE NOT SPECIFIED  Final   Special Requests   Final    BOTTLES DRAWN AEROBIC AND ANAEROBIC Blood Culture adequate volume   Culture   Final    NO GROWTH 2  DAYS Performed at [facility], [address] [place], [place] [address]    Report Status PENDING  Incomplete  Resp panel by RT-PCR (RSV, Flu A&B, Covid) Anterior Nasal Swab     Status: None   Collection Time: 04/06/23  1:39 AM   Specimen: Anterior Nasal Swab  Result Value Ref Range Status   SARS Coronavirus 2 by RT PCR NEGATIVE NEGATIVE Final   Influenza A by PCR NEGATIVE NEGATIVE Final   Influenza B by PCR NEGATIVE NEGATIVE Final    Comment: (NOTE) The Xpert Xpress SARS-CoV-2/FLU/RSV plus assay is intended as an aid in the diagnosis of influenza from Nasopharyngeal swab specimens and should not be used as a sole basis for treatment. Nasal washings and aspirates are unacceptable for Xpert Xpress SARS-CoV-2/FLU/RSV testing.  Fact Sheet for Patients: BloggerCourse.com  Fact Sheet for Healthcare Providers: SeriousBroker.it  This test is not yet approved or cleared by the [place] FDA and has been authorized for detection and/or diagnosis of SARS-CoV-2 by FDA under an Emergency Use Authorization (EUA). This EUA will remain in effect (meaning this test can be used) for the duration of the COVID-19 declaration under Section 564(b)(1) of the Act, 21 U.S.C. section 360bbb-3(b)(1), unless the authorization is terminated or revoked.     Resp Syncytial Virus by PCR NEGATIVE NEGATIVE Final    Comment: (NOTE) Fact Sheet for Patients: BloggerCourse.com  Fact Sheet for  Healthcare Providers: SeriousBroker.it  This test is not yet approved or cleared by the [place] FDA and has been authorized for detection and/or diagnosis of SARS-CoV-2 by FDA under an Emergency Use Authorization (EUA). This EUA will remain in effect (meaning this test can be used) for the duration of the COVID-19 declaration under Section 564(b)(1) of the Act, 21 U.S.C. section 360bbb-3(b)(1), unless the authorization is terminated or revoked.  Performed at [facility], [address] [place], [place] [address]   Expectorated Sputum Assessment w Gram Stain, Rflx to Resp Cult     Status: None   Collection Time: 04/06/23  8:00 AM   Specimen: Sputum  Result Value Ref Range Status   Specimen Description SPUTUM  Final   Special Requests NONE  Final   Sputum evaluation   Final    Sputum specimen not acceptable for testing.  Please recollect.   Performed at [facility], [address], [place], [place] [address]    Report Status 04/06/2023 FINAL  Final    Procedures/Studies: CT Angio Chest PE W/Cm &/Or Wo Cm Result Date: 04/06/2023 CLINICAL DATA:  Respiratory distress and positive D-dimer. EXAM: CT ANGIOGRAPHY CHEST WITH CONTRAST TECHNIQUE: Multidetector CT imaging of the chest was performed using the standard protocol during bolus administration of intravenous contrast. Multiplanar CT image reconstructions and MIPs were obtained to evaluate the vascular anatomy. RADIATION DOSE REDUCTION: This exam was performed according to the departmental dose-optimization program which includes automated exposure control, adjustment of the mA and/or kV according to patient size and/or use of iterative reconstruction technique. CONTRAST:  75mL OMNIPAQUE IOHEXOL 350 MG/ML SOLN COMPARISON:  Portable chest today, AP Lat chest 02/16/2023, PA Lat chest 09/09/2022, and chest CTs with contrast 04/07/2022 and 10/19/2015. FINDINGS: Cardiovascular: There is mild  cardiomegaly. No pericardial effusion. Scattered three-vessel coronary artery calcifications. Mild-to-moderate calcific plaques are noted in the aorta with mild calcifications of the great vessels. There is no aneurysm, stenosis or dissection. There is mild aortic tortuosity. Pulmonary arteries are normal in caliber without evidence of arterial emboli. The  pulmonary veins are nondistended. Mediastinum/Nodes: There are calcified bilateral hilar and mediastinal lymph nodes. Stable prominence of and azygoesophageal recess lymph node measuring 11 mm short axis on 4:66. No other significant lymph nodes. The lower poles of the thyroid gland the thoracic esophagus are unremarkable. There is scattered layering fluid in the distal trachea. Lungs/Pleura: Similar to the previous exam there is fluid and debris opacifying the right lower lobe bronchus with complete right lower lobe collapse/consolidation. There is consolidation of the majority of the right middle lobe as well, with relative peripheral sparing. In the right upper lobe there are thickened bronchi with the segmental bronchi showing plugging with fluid or mucoid material. The lungs are mildly emphysematous with centrilobular changes predominating in the upper lobes, biapical pleural-parenchymal scarring. Subpleural reticulation appears similar in the anterior right upper lobe and there is a calcified granuloma in the medial right apex. Scattered airspace disease in the posterior segment of the right upper lobe is probably due to bronchopneumonia. Left lung is clear of infiltrates. There is a minimal layering right and no left pleural effusion. Right hemidiaphragm is chronically elevated to the level of the mid hilum consistent with eventration or paresis. Upper Abdomen: No acute abnormality.  Status post cholecystectomy. Musculoskeletal: No chest wall abnormality. No acute or significant osseous findings. Review of the MIP images confirms the above findings.  IMPRESSION: 1. No evidence of arterial emboli. 2. Fluid and debris opacifying the right lower lobe bronchus with complete right lower lobe collapse/consolidation; consolidation of the majority of the right middle lobe with relative peripheral sparing. Similar findings were noted on the 2 prior CTs. Findings suggest chronic aspiration. 3. Thickened right upper lobe bronchi with segmental bronchial plugging with fluid or mucoid material. 4. Scattered airspace disease in the posterior segment of the right upper lobe probably due to bronchopneumonia. 5. Minimal layering right pleural effusion. 6. Emphysema. 7. Aortic and coronary artery atherosclerosis. 8. Chronically elevated right hemidiaphragm consistent with eventration or paresis. 9. Old granulomatous disease. Aortic Atherosclerosis (ICD10-I70.0) and Emphysema (ICD10-J43.9). Electronically Signed   By: Almira Bar M.D.   On: 04/06/2023 03:05   DG Chest Portable 1 View Result Date: 04/06/2023 CLINICAL DATA:  Dyspnea EXAM: PORTABLE CHEST 1 VIEW COMPARISON:  02/16/2023 FINDINGS: There is near complete collapse of the right lower and middle lobes with right-sided volume loss and elevation of right hemidiaphragm. This appears similar to more remote prior examination of 04/08/2022. Left lung is clear. No pneumothorax or pleural effusion. Cardiac size within normal limits. Pulmonary vascularity is normal. No acute bone abnormality. IMPRESSION: 1. Near complete collapse of the right lower and middle lobes with right-sided volume loss and elevation of right hemidiaphragm. This appears similar to more remote prior examination of 04/08/2022. Correlation for a central obstructing lesion such as a mucous plug or aspirated material may be helpful. Electronically Signed   By: Helyn Numbers M.D.   On: 04/06/2023 01:14    Time coordinating discharge: 45 mins  SIGNED:  Carollee Herter, DO [facility] 04/08/23, 2:14 PM

## 2023-04-08 NOTE — TOC Transition Note (Addendum)
Transition of Care (TOC) - Discharge Note Donn Pierini RN, BSN Transitions of Care Unit 4E- RN Case Manager See Treatment Team for direct phone #   Patient Details  Name: Thomas Raymond MRN: [id number] Date of Birth: 12-03-1945  Transition of Care [facility]) CM/SW Contact:  Darrold Span, RN Phone Number: 04/08/2023, 2:48 PM   Clinical Narrative:    Pt stable for transition home today w/ family, will need EMS transport home- pending delivery of home 02 and suction.   Orders have been placed for home 02 and home suction. Pt has [facility] HMO that has contract with [employer] for DME needs.  Call made to [employer] liaison for DME- home 02 and suction needs- request for delivery as soon as possible- pt pending discharge.  [employer] to contact family regarding delivery.   1250- DME still pending delivery- CM spoke with pt and wife at bedside- explained that 02 needed to be in the home prior to EMS transporting or they would bring pt back to ED. Wife voiced understanding and states she will head home for delivery- unable to reach other family to make sure someone is at the home. Address confirmed for transport, contact phone # confirmed for wife.   [facility] to follow for palliative care.   1445- received msg from [employer] liaison that delivery of home DME should be around 1530 this afternoon, family is at the home waiting for delivery.  [facility] called to schedule transportation home. Per dispatch ETA for transport is between 1-2 hours.  Paperwork and GOLD DNR placed on chart.  Bedside RN updated.   1600- family called and confirmed that home 02 has been delivered- informed family that EMS has been called and pt on list for transport home. Family voiced understanding.    Final next level of care: Home/Self Care Barriers to Discharge: Barriers Resolved   Patient Goals and CMS Choice Patient states their goals for this hospitalization and ongoing recovery are:: return home w/ family CMS  Medicare.gov Compare Post Acute Care list provided to:: Patient Represenative (must comment) Choice offered to / list presented to : Patient, Spouse      Discharge Placement               Home         Discharge Plan and Services Additional resources added to the After Visit Summary for     Discharge Planning Services: CM Consult Post Acute Care Choice: Durable Medical Equipment          DME Arranged: Other see comment, Oxygen (suction) DME Agency: [employer] Date DME Agency Contacted: 04/08/23 Time DME Agency Contacted: 1000 Representative spoke with at DME Agency: [facility] HH Arranged: Disease Management HH Agency: [facility] Date [facility] Agency Contacted: 04/06/23   Representative spoke with at [facility] Agency: Deanna Artis  Social Drivers of Health (SDOH) Interventions SDOH Screenings   Food Insecurity: No Food Insecurity (04/06/2023)  Housing: Unknown (04/06/2023)  Transportation Needs: No Transportation Needs (04/06/2023)  Utilities: Not At Risk (04/06/2023)  Depression (PHQ2-9): Low Risk  (02/11/2023)  Tobacco Use: Low Risk  (04/06/2023)     Readmission Risk Interventions    04/08/2023    2:48 PM  Readmission Risk Prevention Plan  Medication Screening Complete  Transportation Screening Complete

## 2023-04-08 NOTE — Plan of Care (Signed)
  Problem: Nutrition: Goal: Adequate nutrition will be maintained Outcome: Progressing   

## 2023-04-08 NOTE — Plan of Care (Signed)
  Problem: Education: Goal: Knowledge of General Education information will improve Description: Including pain rating scale, medication(s)/side effects and non-pharmacologic comfort measures Outcome: Progressing   Problem: Health Behavior/Discharge Planning: Goal: Ability to manage health-related needs will improve Outcome: Progressing   Problem: Clinical Measurements: Goal: Ability to maintain clinical measurements within normal limits will improve Outcome: Progressing Goal: Will remain free from infection Outcome: Progressing Goal: Respiratory complications will improve Outcome: Progressing   Problem: Activity: Goal: Risk for activity intolerance will decrease Outcome: Progressing   Problem: Nutrition: Goal: Adequate nutrition will be maintained Outcome: Progressing   Problem: Pain Management: Goal: General experience of comfort will improve Outcome: Progressing   Problem: Safety: Goal: Ability to remain free from injury will improve Outcome: Progressing

## 2023-04-09 ENCOUNTER — Telehealth: Payer: Self-pay | Admitting: *Deleted

## 2023-04-09 NOTE — Transitions of Care (Post Inpatient/ED Visit) (Signed)
   04/09/2023  Name: Thomas Raymond MRN: [id number] DOB: 13-Feb-1946  Today's TOC FU Call Status: Today's TOC FU Call Status:: Unsuccessful Call (2nd Attempt) Unsuccessful Call (2nd Attempt) Date: 04/09/23  Attempted to reach the patient regarding the most recent Inpatient/ED visit.  Follow Up Plan: Additional outreach attempts will be made to reach the patient to complete the Transitions of Care (Post Inpatient/ED visit) call.   Gean Maidens BSN RN Population Health- Transition of Care Team.  [facility] [phone]

## 2023-04-09 NOTE — Transitions of Care (Post Inpatient/ED Visit) (Signed)
   04/09/2023  Name: Thomas Raymond MRN: [id number] DOB: 03-26-1946  Today's TOC FU Call Status: Today's TOC FU Call Status:: Unsuccessful Call (1st Attempt) Unsuccessful Call (1st Attempt) Date: 04/09/23  Attempted to reach the patient regarding the most recent Inpatient/ED visit.  Follow Up Plan: Additional outreach attempts will be made to reach the patient to complete the Transitions of Care (Post Inpatient/ED visit) call.   Gean Maidens BSN RN Population Health- Transition of Care Team.  [facility] [phone]

## 2023-04-10 ENCOUNTER — Telehealth: Payer: Self-pay

## 2023-04-10 NOTE — Transitions of Care (Post Inpatient/ED Visit) (Signed)
   04/10/2023  Name: Thomas Raymond MRN: [id number] DOB: April 18, 1946  Today's TOC FU Call Status: Today's TOC FU Call Status:: Unsuccessful Call (3rd Attempt) Unsuccessful Call (3rd Attempt) Date: 04/10/23  Attempted to reach the patient regarding the most recent Inpatient/ED visit. No answer at [facility] contact information. Unable to leave a voice mail.   Follow Up Plan: No further outreach attempts will be made at this time. We have been unable to contact the patient.   Gabriel Cirri MSN, RN [place]  [facility] Management Coordinator [email or web address] Direct Dial: [phone] Fax: [phone]

## 2023-04-11 LAB — CULTURE, BLOOD (ROUTINE X 2)
Culture: NO GROWTH
Culture: NO GROWTH
Special Requests: ADEQUATE
Special Requests: ADEQUATE

## 2023-04-30 ENCOUNTER — Telehealth: Payer: Self-pay

## 2023-04-30 ENCOUNTER — Telehealth: Payer: Self-pay | Admitting: Internal Medicine

## 2023-04-30 NOTE — Telephone Encounter (Signed)
 Copied from CRM [id number]. Topic: Clinical - Medical Advice >> Apr 30, 2023  1:59 PM Elizebeth Brooking wrote:  Reason for CRM: Marcelino Duster from [facility] is asking for a callback regarding the patient feeding tube her call back number is [phone]

## 2023-04-30 NOTE — Telephone Encounter (Signed)
 As I dont normally order this, and not sure how to state the order, please have pharmacy submit the order for me to sign    thanks

## 2023-04-30 NOTE — Telephone Encounter (Signed)
 Copied from CRM [id number]. Topic: Clinical - Prescription Issue >> Apr 30, 2023 11:11 AM Evie B wrote: Reason for CRM: pt daughter Candyce called requesting a prescription for osmolite food for feeding tube. She states the pharmacy needs another order. She is requesting the provider called [employer] at [phone] therefore she can feed her father. Please call her back at [phone]

## 2023-05-01 NOTE — Telephone Encounter (Signed)
Will be on the look out for the form.  

## 2023-05-01 NOTE — Telephone Encounter (Signed)
Form placed on provider desk.

## 2023-05-01 NOTE — Telephone Encounter (Signed)
 Copied from CRM [id number]. Topic: Clinical - Medical Advice >> May 01, 2023  9:20 AM Deaijah H wrote:   Reason for CRM: Patient daughter called wanting to know if there was any update regarding feeding tube. Advised note: As I don't normally order this, and not sure how to state the order, please have pharmacy submit the order for me to sign thanks Patient asked where would pharmacy get order from and does the doctor has to be the one to submit it. Would like further information // callback # [phone] (daughter)  would like a call back today due to father running off of past supplies and down to 5 days of rationing.

## 2023-05-01 NOTE — Telephone Encounter (Signed)
 Reason for CRM: Rosaline from [facility] [phone] has called in regards to patient being placed back  on feeding tube per request of family. She states that the provider was faxed over a form to sign and date. Once the form is sent back to her signed she will then proceed with ordering necessary feeding supplements for the patient.  Fax number for the form to be sent is [phone]

## 2023-05-01 NOTE — Telephone Encounter (Signed)
 error

## 2023-05-02 NOTE — Telephone Encounter (Signed)
 Forms have been faxed

## 2023-05-04 DIAGNOSIS — J690 Pneumonitis due to inhalation of food and vomit: Secondary | ICD-10-CM | POA: Diagnosis not present

## 2023-05-04 DIAGNOSIS — R1314 Dysphagia, pharyngoesophageal phase: Secondary | ICD-10-CM | POA: Diagnosis not present

## 2023-05-04 DIAGNOSIS — T17800A Unspecified foreign body in other parts of respiratory tract causing asphyxiation, initial encounter: Secondary | ICD-10-CM | POA: Diagnosis not present

## 2023-05-04 DIAGNOSIS — G1221 Amyotrophic lateral sclerosis: Secondary | ICD-10-CM | POA: Diagnosis not present

## 2023-05-08 ENCOUNTER — Other Ambulatory Visit: Payer: Self-pay | Admitting: Internal Medicine

## 2023-05-08 NOTE — Telephone Encounter (Signed)
Copied from CRM [id number]. Topic: Clinical - Medication Refill >> May 08, 2023  9:46 AM Florestine Avers wrote: Most Recent Primary Care Visit:  Provider: Moshe Cipro  Department: [facility]  Visit Type: OFFICE VISIT  Date: 04/02/2023  Medication: hydrocodone-homatropine   Has the patient contacted their pharmacy? Yes (Agent: If no, request that the patient contact the pharmacy for the refill. If patient does not wish to contact the pharmacy document the reason why and proceed with request.) (Agent: If yes, when and what did the pharmacy advise?)  Is this the correct pharmacy for this prescription? Yes If no, delete pharmacy and type the correct one.  This is the patient's preferred pharmacy:  [id number] [place], [place] - [address] AT [facility] [address] [place] [address] Phone: [phone] Fax: [phone]    Has the prescription been filled recently? Yes  Is the patient out of the medication? Yes  Has the patient been seen for an appointment in the last year OR does the patient have an upcoming appointment? Yes  Can we respond through MyChart? Yes  Agent: Please be advised that Rx refills may take up to 3 business days. We ask that you follow-up with your pharmacy.

## 2023-05-13 DIAGNOSIS — T17800A Unspecified foreign body in other parts of respiratory tract causing asphyxiation, initial encounter: Secondary | ICD-10-CM | POA: Diagnosis not present

## 2023-05-13 DIAGNOSIS — R1314 Dysphagia, pharyngoesophageal phase: Secondary | ICD-10-CM | POA: Diagnosis not present

## 2023-05-13 DIAGNOSIS — G1221 Amyotrophic lateral sclerosis: Secondary | ICD-10-CM | POA: Diagnosis not present

## 2023-05-13 DIAGNOSIS — J690 Pneumonitis due to inhalation of food and vomit: Secondary | ICD-10-CM | POA: Diagnosis not present

## 2023-07-31 DIAGNOSIS — G1221 Amyotrophic lateral sclerosis: Secondary | ICD-10-CM | POA: Diagnosis not present

## 2023-07-31 DIAGNOSIS — T17800A Unspecified foreign body in other parts of respiratory tract causing asphyxiation, initial encounter: Secondary | ICD-10-CM | POA: Diagnosis not present

## 2023-07-31 DIAGNOSIS — J690 Pneumonitis due to inhalation of food and vomit: Secondary | ICD-10-CM | POA: Diagnosis not present

## 2023-07-31 DIAGNOSIS — R1314 Dysphagia, pharyngoesophageal phase: Secondary | ICD-10-CM | POA: Diagnosis not present

## 2023-08-04 ENCOUNTER — Telehealth: Payer: Self-pay | Admitting: Internal Medicine

## 2023-08-04 NOTE — Telephone Encounter (Signed)
 Copied from CRM [id number]. Topic: General - Transportation >> Aug 01, 2023  2:48 PM Adonis Hoot wrote: Reason for CRM: Patients daughter called in to request to have a certification from Dr Autry Legions to [facility] for access [place] form stating that he needs wheelchair access.Application B should be completed .Daughter stated that  they have brought forms to over on two different occasions and they are needing it faxed back to [facility] as soon as possible. Daughter would like to just have form from  July of last year 2024 even faxed over if able. Fx number: [id number] EAV:[id number] [facility] Cardell Chang is requesting a call once these form have been faxed back over.

## 2023-08-06 NOTE — Telephone Encounter (Signed)
 Scanned forms from July has been faxed today.

## 2023-08-15 ENCOUNTER — Other Ambulatory Visit: Payer: Self-pay

## 2023-08-15 ENCOUNTER — Other Ambulatory Visit: Payer: Self-pay | Admitting: Internal Medicine

## 2023-09-03 ENCOUNTER — Ambulatory Visit

## 2023-09-24 NOTE — Telephone Encounter (Signed)
 Patient is needing a new form for [employer] access to eb refilled dout so he can get transportation  to his appts he needs it filled out today the daughter stated she would like a call back regarding this the last form was filled out incorrectly

## 2023-09-25 NOTE — Telephone Encounter (Signed)
 Pt needs to drop off form for it to be filled out.

## 2023-10-15 DIAGNOSIS — T17800A Unspecified foreign body in other parts of respiratory tract causing asphyxiation, initial encounter: Secondary | ICD-10-CM | POA: Diagnosis not present

## 2023-10-15 DIAGNOSIS — G1221 Amyotrophic lateral sclerosis: Secondary | ICD-10-CM | POA: Diagnosis not present

## 2023-10-15 DIAGNOSIS — J690 Pneumonitis due to inhalation of food and vomit: Secondary | ICD-10-CM | POA: Diagnosis not present

## 2023-10-15 DIAGNOSIS — R1314 Dysphagia, pharyngoesophageal phase: Secondary | ICD-10-CM | POA: Diagnosis not present

## 2023-11-07 ENCOUNTER — Other Ambulatory Visit: Payer: Self-pay | Admitting: Internal Medicine

## 2023-11-19 ENCOUNTER — Telehealth: Payer: Self-pay | Admitting: Internal Medicine

## 2023-11-19 NOTE — Telephone Encounter (Signed)
 Patient dropped off document from [employer], to be filled out by provider. Patient requested to send it back via Call Patient to pick up within 7-days. Document is located in providers tray at front office.Please advise at Mobile [phone] (mobile)

## 2023-11-26 ENCOUNTER — Other Ambulatory Visit: Payer: Self-pay

## 2023-11-26 ENCOUNTER — Telehealth: Payer: Self-pay | Admitting: Internal Medicine

## 2023-11-26 DIAGNOSIS — Z5982 Transportation insecurity: Secondary | ICD-10-CM

## 2023-11-26 NOTE — Telephone Encounter (Signed)
 Contacted Thomas Raymond to schedule their annual wellness visit. Patient declined to schedule AWV at this time. Would not schedule due to transportation. I spoke with [name] in referrals and she informed me how to place a referral for transportation. I will schedule his appointment for sometime in September with [facility] and pcp.  [facility] Care Guide [facility] AWV TEAM Direct Dial: [phone]

## 2023-11-26 NOTE — Telephone Encounter (Signed)
 Sent to [facility], to add an urgent referral for transportation.

## 2023-11-26 NOTE — Addendum Note (Signed)
 Addended by: GERALDENE VERDIE HERO on: 11/26/2023 11:30 AM   Modules accepted: Orders

## 2023-11-27 ENCOUNTER — Telehealth: Payer: Self-pay | Admitting: *Deleted

## 2023-12-03 NOTE — Progress Notes (Signed)
 Complex Care Management Note Care Guide Note  12/03/2023 Name: Rubel Heckard MRN: [id number] DOB: 1946/03/23   Complex Care Management Outreach Attempts: An unsuccessful telephone outreach was attempted today to offer the patient information about available complex care management services.  Follow Up Plan:  Additional outreach attempts will be made to offer the patient complex care management information and services.   Encounter Outcome:  No Answer  [facility] HealthPopulation Health Care Guide  Direct [phone] Fax:[phone] Website: [email or web address]

## 2023-12-04 ENCOUNTER — Telehealth: Payer: Self-pay | Admitting: *Deleted

## 2023-12-04 NOTE — Telephone Encounter (Signed)
 Forms received but did not include all requested forms (requested for part B to be fixed but we only received part A). I have since printed out all required forms and will work to fill out what I can.

## 2023-12-04 NOTE — Progress Notes (Signed)
 Complex Care Management Note Care Guide Note  12/04/2023 Name: Thomas Raymond MRN: [id number] DOB: [birth year]   Complex Care Management Outreach Attempts: A second unsuccessful outreach was attempted today to offer the patient with information about available complex care management services.  Follow Up Plan:  Additional outreach attempts will be made to offer the patient complex care management information and services.   Encounter Outcome:  Will call again Asencion Gell  [facility] HealthPopulation Health Care Guide  Direct Dial:[phone] Fax:[phone] Website: [email or web address]

## 2023-12-08 ENCOUNTER — Telehealth: Payer: Self-pay | Admitting: *Deleted

## 2023-12-08 NOTE — Progress Notes (Signed)
 Complex Care Management Note Care Guide Note  12/08/2023 Name: Thomas Raymond MRN: [id number] DOB: 1946-04-10   Complex Care Management Outreach Attempts: A third unsuccessful outreach was attempted today to offer the patient with information about available complex care management services.  Follow Up Plan:  Additional outreach attempts will be made to offer the patient complex care management information and services.   Encounter Outcome:  No Answer  [facility] HealthPopulation Health Care Guide  Direct Dial:[phone] Fax:[phone] Website: [email or web address]

## 2023-12-17 ENCOUNTER — Ambulatory Visit: Admitting: Internal Medicine

## 2023-12-17 ENCOUNTER — Ambulatory Visit

## 2023-12-22 DIAGNOSIS — G1221 Amyotrophic lateral sclerosis: Secondary | ICD-10-CM | POA: Diagnosis not present

## 2023-12-22 DIAGNOSIS — T17800A Unspecified foreign body in other parts of respiratory tract causing asphyxiation, initial encounter: Secondary | ICD-10-CM | POA: Diagnosis not present

## 2023-12-22 DIAGNOSIS — J690 Pneumonitis due to inhalation of food and vomit: Secondary | ICD-10-CM | POA: Diagnosis not present

## 2023-12-22 DIAGNOSIS — R1314 Dysphagia, pharyngoesophageal phase: Secondary | ICD-10-CM | POA: Diagnosis not present

## 2023-12-30 ENCOUNTER — Telehealth: Payer: Self-pay | Admitting: Radiology

## 2023-12-30 NOTE — Telephone Encounter (Signed)
 Copied from CRM [id number]. Topic: General - Other >> Dec 30, 2023  1:12 PM Paige D wrote: Reason for CRM: Pt calling in regards to paper work that is needed to be filled out for transportation. Pt needs this paperwork if paper work is ready for pick up please call pt as he states he has sent it in 3 times already.

## 2024-01-08 NOTE — Telephone Encounter (Signed)
 Forms provided to supervisor for assistance

## 2024-01-14 ENCOUNTER — Ambulatory Visit

## 2024-01-14 ENCOUNTER — Ambulatory Visit: Admitting: Internal Medicine

## 2024-01-15 ENCOUNTER — Telehealth: Payer: Self-pay | Admitting: *Deleted

## 2024-01-15 NOTE — Progress Notes (Signed)
 Complex Care Management Note Care Guide Note  01/15/2024 Name: Izear Pine MRN: [id number] DOB: [birth year]  Yahir Tavano is a 78 y.o. year old male who is a primary care patient of Norleen, Lynwood ORN, MD . The community resource team was consulted for assistance with Transportation Needs   SDOH screenings and interventions completed:  Yes   Per message  AVS nurse :Left a message for patient daughter again , their only option for transportation is [facility] access so I will email them at at her email in the chart with the reach out information , new policy is if the patient does not answer in the 3 calls then we cannot do another referral for 6 months so i cannot keep calling , but I will email her that information in [facility] guide performed the following interventions: Patient provided with information about care guide support team and interviewed to confirm resource needs.  Follow Up Plan:  No further follow up planned at this time. The patient has been provided with needed resources.  Encounter Outcome:  Patient Visit Completed  Ayiana Winslett Greenauer-Moran  [facility] HealthPopulation Health Care Guide  Direct Dial:[phone] [phone] Website: [email or web address]

## 2024-02-12 ENCOUNTER — Other Ambulatory Visit: Payer: Self-pay

## 2024-02-12 ENCOUNTER — Other Ambulatory Visit: Payer: Self-pay | Admitting: Internal Medicine

## 2024-03-04 DIAGNOSIS — T17800A Unspecified foreign body in other parts of respiratory tract causing asphyxiation, initial encounter: Secondary | ICD-10-CM | POA: Diagnosis not present

## 2024-03-04 DIAGNOSIS — R1314 Dysphagia, pharyngoesophageal phase: Secondary | ICD-10-CM | POA: Diagnosis not present

## 2024-03-04 DIAGNOSIS — G1221 Amyotrophic lateral sclerosis: Secondary | ICD-10-CM | POA: Diagnosis not present

## 2024-03-04 DIAGNOSIS — J690 Pneumonitis due to inhalation of food and vomit: Secondary | ICD-10-CM | POA: Diagnosis not present

## 2024-03-12 ENCOUNTER — Other Ambulatory Visit: Payer: Self-pay | Admitting: Internal Medicine

## 2024-03-12 ENCOUNTER — Other Ambulatory Visit: Payer: Self-pay

## 2024-05-14 ENCOUNTER — Telehealth: Payer: Self-pay | Admitting: Internal Medicine

## 2024-05-14 ENCOUNTER — Telehealth: Payer: Self-pay

## 2024-05-14 NOTE — Telephone Encounter (Signed)
Form has been faxed back today

## 2024-05-14 NOTE — Telephone Encounter (Signed)
 Copied from CRM #[id number]. Topic: Appointments - Scheduling Inquiry for Clinic >> May 14, 2024 12:38 PM Willma R wrote: Reason for CRM: Patient needs to schedule a transfer of care appointment. Daughter Chan is concerned as the patient is fully disabled. States in order to get insurance to cover the cost of his transportation to and from the office the need his physician to complete a form. Has been sent to Dr Norleen previously but have been told need to come in to have it filled out, but patient is unable as he cannot get transportation without paying out of pocket which costs significantly. Patients daughter Candyce is looking for assistance on how to be schedule him and get the transportation cost covered.  Candyce can be reached at [phone]

## 2024-05-14 NOTE — Telephone Encounter (Signed)
 Copied from CRM #[id number]. Topic: Clinical - Order For Equipment >> May 14, 2024 12:32 PM Willma R wrote: Reason for CRM: Patient is requesting an order for tube feeding supplies. States has requested multiple times but has not gotten a response. [employer] has also called multiple times for approval and has not received anything. Is requesting to speak with someone directly to get make sure the order is submitted as states has been trying since the beginning of January.   Candyce can be reached at [phone]

## 2024-05-15 NOTE — Telephone Encounter (Signed)
 I believe filling out the form should be able to be done, but I would need to see the form.  If you could forward this  -  thanks

## 2024-05-17 NOTE — Telephone Encounter (Signed)
 Called and unable to voicemail, Please if Pt call back to relay provider message.

## 2024-05-19 ENCOUNTER — Other Ambulatory Visit: Payer: Self-pay

## 2024-05-19 ENCOUNTER — Other Ambulatory Visit: Payer: Self-pay | Admitting: Internal Medicine
